# Patient Record
Sex: Female | Born: 1985 | Race: White | Hispanic: No | Marital: Married | Smoking: Never smoker
Health system: Southern US, Community
[De-identification: ages and names within clinical notes are randomized; demographics above are authoritative.]

## PROBLEM LIST (undated history)

## (undated) ENCOUNTER — Inpatient Hospital Stay (HOSPITAL_COMMUNITY): Payer: Self-pay

## (undated) DIAGNOSIS — G252 Other specified forms of tremor: Secondary | ICD-10-CM

## (undated) DIAGNOSIS — R569 Unspecified convulsions: Secondary | ICD-10-CM

## (undated) DIAGNOSIS — G47419 Narcolepsy without cataplexy: Secondary | ICD-10-CM

## (undated) DIAGNOSIS — N12 Tubulo-interstitial nephritis, not specified as acute or chronic: Secondary | ICD-10-CM

## (undated) DIAGNOSIS — D649 Anemia, unspecified: Secondary | ICD-10-CM

## (undated) DIAGNOSIS — F429 Obsessive-compulsive disorder, unspecified: Secondary | ICD-10-CM

## (undated) DIAGNOSIS — F419 Anxiety disorder, unspecified: Secondary | ICD-10-CM

## (undated) DIAGNOSIS — K219 Gastro-esophageal reflux disease without esophagitis: Secondary | ICD-10-CM

## (undated) DIAGNOSIS — Z87442 Personal history of urinary calculi: Secondary | ICD-10-CM

## (undated) DIAGNOSIS — K589 Irritable bowel syndrome without diarrhea: Secondary | ICD-10-CM

## (undated) DIAGNOSIS — M549 Dorsalgia, unspecified: Secondary | ICD-10-CM

## (undated) DIAGNOSIS — IMO0002 Reserved for concepts with insufficient information to code with codable children: Secondary | ICD-10-CM

## (undated) DIAGNOSIS — Z87898 Personal history of other specified conditions: Secondary | ICD-10-CM

## (undated) DIAGNOSIS — K8020 Calculus of gallbladder without cholecystitis without obstruction: Secondary | ICD-10-CM

## (undated) DIAGNOSIS — R350 Frequency of micturition: Secondary | ICD-10-CM

## (undated) DIAGNOSIS — I671 Cerebral aneurysm, nonruptured: Secondary | ICD-10-CM

## (undated) DIAGNOSIS — J45901 Unspecified asthma with (acute) exacerbation: Secondary | ICD-10-CM

## (undated) DIAGNOSIS — Q615 Medullary cystic kidney: Secondary | ICD-10-CM

## (undated) DIAGNOSIS — Z8669 Personal history of other diseases of the nervous system and sense organs: Secondary | ICD-10-CM

## (undated) DIAGNOSIS — Z87440 Personal history of urinary (tract) infections: Secondary | ICD-10-CM

## (undated) HISTORY — DX: Irritable bowel syndrome, unspecified: K58.9

## (undated) HISTORY — DX: Narcolepsy without cataplexy: G47.419

## (undated) HISTORY — PX: WISDOM TOOTH EXTRACTION: SHX21

## (undated) HISTORY — DX: Personal history of other specified conditions: Z87.898

## (undated) HISTORY — PX: BACK SURGERY: SHX140

## (undated) HISTORY — DX: Tubulo-interstitial nephritis, not specified as acute or chronic: N12

## (undated) HISTORY — DX: Cerebral aneurysm, nonruptured: I67.1

## (undated) HISTORY — DX: Unspecified asthma with (acute) exacerbation: J45.901

## (undated) HISTORY — DX: Obsessive-compulsive disorder, unspecified: F42.9

## (undated) HISTORY — DX: Reserved for concepts with insufficient information to code with codable children: IMO0002

---
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
## 2005-08-14 ENCOUNTER — Ambulatory Visit: Payer: Self-pay | Admitting: Internal Medicine

## 2005-09-25 DIAGNOSIS — Q615 Medullary cystic kidney: Secondary | ICD-10-CM

## 2005-09-25 HISTORY — DX: Medullary cystic kidney: Q61.5

## 2005-09-26 ENCOUNTER — Ambulatory Visit: Payer: Self-pay | Admitting: Internal Medicine

## 2006-01-20 ENCOUNTER — Ambulatory Visit: Payer: Self-pay | Admitting: Family Medicine

## 2006-02-22 ENCOUNTER — Ambulatory Visit: Payer: Self-pay | Admitting: Cardiology

## 2006-04-14 ENCOUNTER — Emergency Department (HOSPITAL_COMMUNITY): Admission: EM | Admit: 2006-04-14 | Discharge: 2006-04-14 | Payer: Self-pay | Admitting: Family Medicine

## 2006-04-17 ENCOUNTER — Observation Stay (HOSPITAL_COMMUNITY): Admission: AD | Admit: 2006-04-17 | Discharge: 2006-04-18 | Payer: Self-pay | Admitting: Internal Medicine

## 2006-04-17 ENCOUNTER — Ambulatory Visit: Payer: Self-pay | Admitting: Internal Medicine

## 2006-04-18 ENCOUNTER — Ambulatory Visit: Payer: Self-pay | Admitting: Internal Medicine

## 2006-09-08 ENCOUNTER — Emergency Department (HOSPITAL_COMMUNITY): Admission: EM | Admit: 2006-09-08 | Discharge: 2006-09-08 | Payer: Self-pay | Admitting: Emergency Medicine

## 2006-09-12 ENCOUNTER — Ambulatory Visit: Payer: Self-pay | Admitting: Internal Medicine

## 2006-12-07 ENCOUNTER — Ambulatory Visit: Payer: Self-pay | Admitting: Internal Medicine

## 2006-12-07 LAB — CONVERTED CEMR LAB
Albumin: 3.9 g/dL (ref 3.5–5.2)
Amylase: 78 units/L (ref 27–131)
BUN: 8 mg/dL (ref 6–23)
CO2: 29 meq/L (ref 19–32)
Calcium: 9.2 mg/dL (ref 8.4–10.5)
Creatinine, Ser: 0.6 mg/dL (ref 0.4–1.2)
Crystals: NEGATIVE
GFR calc non Af Amer: 135 mL/min
Hemoglobin, Urine: NEGATIVE
Mucus, UA: NEGATIVE
Sed Rate: 11 mm/hr (ref 0–25)
Total Protein, Urine: NEGATIVE mg/dL

## 2006-12-10 ENCOUNTER — Ambulatory Visit: Payer: Self-pay | Admitting: Internal Medicine

## 2007-01-28 ENCOUNTER — Ambulatory Visit: Payer: Self-pay | Admitting: Internal Medicine

## 2007-03-08 ENCOUNTER — Ambulatory Visit: Payer: Self-pay | Admitting: Internal Medicine

## 2007-04-07 ENCOUNTER — Emergency Department (HOSPITAL_COMMUNITY): Admission: EM | Admit: 2007-04-07 | Discharge: 2007-04-07 | Payer: Self-pay | Admitting: *Deleted

## 2007-05-17 ENCOUNTER — Ambulatory Visit: Payer: Self-pay | Admitting: Internal Medicine

## 2007-06-11 ENCOUNTER — Ambulatory Visit: Payer: Self-pay | Admitting: Internal Medicine

## 2007-06-11 LAB — CONVERTED CEMR LAB
Bacteria, UA: NEGATIVE
Basophils Absolute: 0 10*3/uL (ref 0.0–0.1)
Eosinophils Absolute: 0.1 10*3/uL (ref 0.0–0.6)
Eosinophils Relative: 1.4 % (ref 0.0–5.0)
HCT: 37.3 % (ref 36.0–46.0)
Hemoglobin, Urine: NEGATIVE
Ketones, ur: NEGATIVE mg/dL
Leukocytes, UA: NEGATIVE
MCHC: 34.7 g/dL (ref 30.0–36.0)
Neutrophils Relative %: 68.9 % (ref 43.0–77.0)
Platelets: 243 10*3/uL (ref 150–400)
RBC: 4.18 M/uL (ref 3.87–5.11)
RDW: 11.6 % (ref 11.5–14.6)
Specific Gravity, Urine: 1.025 (ref 1.000–1.03)
Total Protein, Urine: NEGATIVE mg/dL
WBC: 6.5 10*3/uL (ref 4.5–10.5)
pH: 6 (ref 5.0–8.0)

## 2007-06-12 ENCOUNTER — Telehealth: Payer: Self-pay | Admitting: Internal Medicine

## 2007-06-24 ENCOUNTER — Encounter: Payer: Self-pay | Admitting: *Deleted

## 2007-06-24 DIAGNOSIS — N12 Tubulo-interstitial nephritis, not specified as acute or chronic: Secondary | ICD-10-CM | POA: Insufficient documentation

## 2007-06-24 DIAGNOSIS — Z87898 Personal history of other specified conditions: Secondary | ICD-10-CM

## 2007-06-24 HISTORY — DX: Personal history of other specified conditions: Z87.898

## 2007-06-24 HISTORY — DX: Tubulo-interstitial nephritis, not specified as acute or chronic: N12

## 2007-10-29 ENCOUNTER — Inpatient Hospital Stay (HOSPITAL_COMMUNITY): Admission: AD | Admit: 2007-10-29 | Discharge: 2007-10-29 | Payer: Self-pay | Admitting: Obstetrics and Gynecology

## 2007-11-06 ENCOUNTER — Telehealth (INDEPENDENT_AMBULATORY_CARE_PROVIDER_SITE_OTHER): Payer: Self-pay | Admitting: *Deleted

## 2007-11-07 ENCOUNTER — Inpatient Hospital Stay (HOSPITAL_COMMUNITY): Admission: AD | Admit: 2007-11-07 | Discharge: 2007-11-07 | Payer: Self-pay | Admitting: Obstetrics and Gynecology

## 2007-11-12 ENCOUNTER — Inpatient Hospital Stay (HOSPITAL_COMMUNITY): Admission: AD | Admit: 2007-11-12 | Discharge: 2007-11-12 | Payer: Self-pay | Admitting: Obstetrics and Gynecology

## 2007-11-15 ENCOUNTER — Inpatient Hospital Stay (HOSPITAL_COMMUNITY): Admission: AD | Admit: 2007-11-15 | Discharge: 2007-11-18 | Payer: Self-pay | Admitting: Obstetrics and Gynecology

## 2007-12-17 ENCOUNTER — Inpatient Hospital Stay (HOSPITAL_COMMUNITY): Admission: AD | Admit: 2007-12-17 | Discharge: 2007-12-20 | Payer: Self-pay | Admitting: Obstetrics and Gynecology

## 2007-12-26 ENCOUNTER — Ambulatory Visit: Payer: Self-pay | Admitting: Internal Medicine

## 2008-01-01 ENCOUNTER — Inpatient Hospital Stay (HOSPITAL_COMMUNITY): Admission: AD | Admit: 2008-01-01 | Discharge: 2008-01-01 | Payer: Self-pay | Admitting: Obstetrics and Gynecology

## 2008-03-08 ENCOUNTER — Inpatient Hospital Stay (HOSPITAL_COMMUNITY): Admission: AD | Admit: 2008-03-08 | Discharge: 2008-03-08 | Payer: Self-pay | Admitting: Obstetrics and Gynecology

## 2008-04-02 ENCOUNTER — Inpatient Hospital Stay (HOSPITAL_COMMUNITY): Admission: AD | Admit: 2008-04-02 | Discharge: 2008-04-02 | Payer: Self-pay | Admitting: Obstetrics and Gynecology

## 2008-04-13 ENCOUNTER — Inpatient Hospital Stay (HOSPITAL_COMMUNITY): Admission: RE | Admit: 2008-04-13 | Discharge: 2008-04-14 | Payer: Self-pay | Admitting: Obstetrics and Gynecology

## 2008-04-21 ENCOUNTER — Inpatient Hospital Stay (HOSPITAL_COMMUNITY): Admission: AD | Admit: 2008-04-21 | Discharge: 2008-04-22 | Payer: Self-pay | Admitting: Obstetrics and Gynecology

## 2008-05-05 ENCOUNTER — Inpatient Hospital Stay (HOSPITAL_COMMUNITY): Admission: AD | Admit: 2008-05-05 | Discharge: 2008-05-05 | Payer: Self-pay | Admitting: Obstetrics and Gynecology

## 2008-05-08 ENCOUNTER — Inpatient Hospital Stay (HOSPITAL_COMMUNITY): Admission: AD | Admit: 2008-05-08 | Discharge: 2008-05-11 | Payer: Self-pay | Admitting: Obstetrics and Gynecology

## 2008-06-03 ENCOUNTER — Ambulatory Visit: Payer: Self-pay | Admitting: Surgery

## 2008-06-03 ENCOUNTER — Ambulatory Visit: Admission: RE | Admit: 2008-06-03 | Discharge: 2008-06-03 | Payer: Self-pay | Admitting: Obstetrics and Gynecology

## 2008-06-03 ENCOUNTER — Encounter (INDEPENDENT_AMBULATORY_CARE_PROVIDER_SITE_OTHER): Payer: Self-pay | Admitting: Obstetrics and Gynecology

## 2008-06-20 ENCOUNTER — Ambulatory Visit: Payer: Self-pay | Admitting: *Deleted

## 2008-06-20 LAB — CONVERTED CEMR LAB: Inflenza A Ag: NEGATIVE

## 2008-07-13 ENCOUNTER — Ambulatory Visit: Payer: Self-pay | Admitting: Internal Medicine

## 2008-07-31 ENCOUNTER — Telehealth: Payer: Self-pay | Admitting: Internal Medicine

## 2008-08-11 ENCOUNTER — Ambulatory Visit (HOSPITAL_COMMUNITY): Admission: RE | Admit: 2008-08-11 | Discharge: 2008-08-12 | Payer: Self-pay | Admitting: Orthopedic Surgery

## 2008-08-13 ENCOUNTER — Telehealth: Payer: Self-pay | Admitting: Internal Medicine

## 2008-08-14 ENCOUNTER — Ambulatory Visit: Payer: Self-pay | Admitting: Internal Medicine

## 2008-08-14 LAB — CONVERTED CEMR LAB
BUN: 11 mg/dL (ref 6–23)
Basophils Relative: 0.3 % (ref 0.0–3.0)
CO2: 31 meq/L (ref 19–32)
Chloride: 102 meq/L (ref 96–112)
Creatinine, Ser: 0.8 mg/dL (ref 0.4–1.2)
Eosinophils Absolute: 0.1 10*3/uL (ref 0.0–0.7)
Eosinophils Relative: 0.8 % (ref 0.0–5.0)
GFR calc non Af Amer: 95 mL/min
Lymphocytes Relative: 27.3 % (ref 12.0–46.0)
MCV: 80.2 fL (ref 78.0–100.0)
Monocytes Relative: 7.1 % (ref 3.0–12.0)
Neutrophils Relative %: 64.5 % (ref 43.0–77.0)
Platelets: 320 10*3/uL (ref 150–400)
Potassium: 3.6 meq/L (ref 3.5–5.1)
RBC: 4.49 M/uL (ref 3.87–5.11)
WBC: 9.4 10*3/uL (ref 4.5–10.5)

## 2008-08-24 ENCOUNTER — Telehealth: Payer: Self-pay | Admitting: Internal Medicine

## 2009-06-29 ENCOUNTER — Emergency Department (HOSPITAL_COMMUNITY): Admission: EM | Admit: 2009-06-29 | Discharge: 2009-06-29 | Payer: Self-pay | Admitting: Family Medicine

## 2009-10-27 ENCOUNTER — Ambulatory Visit: Payer: Self-pay | Admitting: Internal Medicine

## 2009-10-27 DIAGNOSIS — N912 Amenorrhea, unspecified: Secondary | ICD-10-CM

## 2009-10-27 LAB — CONVERTED CEMR LAB
Basophils Relative: 0.9 % (ref 0.0–3.0)
Eosinophils Absolute: 0 10*3/uL (ref 0.0–0.7)
HCT: 36.4 % (ref 36.0–46.0)
Hemoglobin: 12.1 g/dL (ref 12.0–15.0)
Lymphocytes Relative: 23.9 % (ref 12.0–46.0)
Lymphs Abs: 1.4 10*3/uL (ref 0.7–4.0)
MCHC: 33.3 g/dL (ref 30.0–36.0)
Monocytes Relative: 6.2 % (ref 3.0–12.0)
Neutro Abs: 3.8 10*3/uL (ref 1.4–7.7)
RBC: 4.24 M/uL (ref 3.87–5.11)

## 2009-10-28 ENCOUNTER — Telehealth: Payer: Self-pay | Admitting: Internal Medicine

## 2010-01-04 ENCOUNTER — Telehealth: Payer: Self-pay | Admitting: Internal Medicine

## 2010-01-05 ENCOUNTER — Ambulatory Visit: Payer: Self-pay | Admitting: Internal Medicine

## 2010-01-05 DIAGNOSIS — J45901 Unspecified asthma with (acute) exacerbation: Secondary | ICD-10-CM

## 2010-01-05 HISTORY — DX: Unspecified asthma with (acute) exacerbation: J45.901

## 2010-02-14 ENCOUNTER — Ambulatory Visit: Payer: Self-pay | Admitting: Internal Medicine

## 2010-02-15 ENCOUNTER — Encounter: Payer: Self-pay | Admitting: Internal Medicine

## 2010-10-25 NOTE — Progress Notes (Signed)
  Phone Note Call from Patient Call back at [facility] Phone [phone]   Caller: Patient Call For: Dr Yetta Barre Summary of Call: Pt anxious for lab results, please let pt know results when they come in. Initial call taken by: Verdell Face,  October 28, 2009 10:30 AM  Follow-up for Phone Call        cbc is normal, pregnancy test is positive Follow-up by: Etta Grandchild MD,  October 28, 2009 1:53 PM  Additional Follow-up for Phone Call Additional follow up Details #1::        informed pt of results Additional Follow-up by: Ami Bullins CMA,  October 28, 2009 2:00 PM

## 2010-10-25 NOTE — Miscellaneous (Signed)
Summary: Orders Update pft charges  Clinical Lists Changes  Orders: Added new Service order of Carbon Monoxide diffusing w/capacity (94720) - Signed Added new Service order of Lung Volumes (94240) - Signed Added new Service order of Spirometry (Pre & Post) (94060) - Signed 

## 2010-10-25 NOTE — Progress Notes (Signed)
Summary: "Cold"  Phone Note Call from Patient   Summary of Call: Pt developed sore throat 3/25. Non productive cough started 4/3 and sore throat became worse. C/o some chills.  No fever or sinus congestion/drainage.  She has tried otc robitussin and theraflu w/little relief. Unable to come into the office until Thursday pm. Any other suggestions?   *She is no longer pregnant Initial call taken by: Lamar Sprinkles, CMA,  January 04, 2010 5:52 PM  Follow-up for Phone Call        OK for APAP 1000mg  three times a day, continue robitussin DM 1 tsp q 6. hydrtate, vit C. 1500mg  daily Follow-up by: Jacques Navy MD,  January 04, 2010 5:58 PM  Additional Follow-up for Phone Call Additional follow up Details #1::        Pt informed, rescheduled for office visit tomorrow am Additional Follow-up by: Lamar Sprinkles, CMA,  January 04, 2010 6:06 PM

## 2010-10-25 NOTE — Assessment & Plan Note (Signed)
Summary: [name] AND SWEATING,SORE THROAT,HEADACHE,EYES SWOLLEN-LB   Vital Signs:  Patient profile:   25 year old female Menstrual status:  irregular LMP:     09/17/2009 Height:      60 inches Weight:      171 pounds BMI:     33.52 O2 Sat:      98 % on Room air Temp:     98.4 degrees F oral Pulse rate:   80 / minute Pulse rhythm:   regular Resp:     16 per minute BP sitting:   124 / 76  (right arm)  Vitals Entered By: Lucious Groves (October 27, 2009 3:23 PM)  Nutrition Counseling: Patient's BMI is greater than 25 and therefore counseled on weight management options.  O2 Flow:  Room air CC: C/O fatigue, sweating, aches, sore throat, and headache x1 week. Pt has tried OTC Robitussin with no relief./kb, URI symptoms Is Patient Diabetic? No LMP (date): 09/17/2009     Menstrual Status irregular Enter LMP: 09/17/2009 Last PAP Result historical   Primary Care Provider:  Jacques Navy MD  CC:  C/O fatigue, sweating, aches, sore throat, and headache x1 week. Pt has tried OTC Robitussin with no relief./kb, and URI symptoms.  History of Present Illness:  URI Symptoms      This is a 25 year old woman who presents with URI symptoms.  The symptoms began 1 week ago.  The severity is described as mild.  The patient reports sore throat and dry cough, but denies nasal congestion, clear nasal discharge, purulent nasal discharge, earache, and sick contacts.  The patient denies fever, stiff neck, dyspnea, wheezing, rash, vomiting, diarrhea, and use of an antipyretic.  The patient also reports itchy watery eyes, itchy throat, and sneezing.  The patient denies headache, muscle aches, and severe fatigue.  The patient denies the following risk factors for Strep sinusitis: unilateral facial pain, unilateral nasal discharge, double sickening, tooth pain, Strep exposure, tender adenopathy, and absence of cough.    Preventive Screening-Counseling & Management  Alcohol-Tobacco     Alcohol  drinks/day: 0     Smoking Status: never      Drug Use:  no.    Current Medications (verified): 1)  Bacopa Vitamin .... Daily 2)  Beyaz 3-0.02-0.451 Mg Tabs (Drospiren-Eth Estrad-Levomefol) .... Daily  Allergies (verified): 1)  ! * Zomig 2)  ! * Topamax 3)  ! * Axert 4)  ! Cipro  Past History:  Past Medical History: Reviewed history from 07/13/2008 and no changes required. PYELONEPHRITIS (ICD-590.80) MIGRAINES, HX OF (ICD-V13.8)  Past Surgical History: Reviewed history from 07/13/2008 and no changes required. none  Social History: Reviewed history from 07/13/2008 and no changes required. 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   GU:  Denies abnormal vaginal bleeding and discharge.  Physical Exam  General:  alert, well-developed, well-nourished, well-hydrated, appropriate dress, normal appearance, healthy-appearing, and cooperative to examination.   Head:  normocephalic, atraumatic, no abnormalities observed, and no abnormalities palpated.   Eyes:  No corneal or conjunctival inflammation noted. EOMI. Perrla. Funduscopic exam benign, without hemorrhages, exudates or papilledema. Vision grossly normal. Ears:  R ear normal and L ear normal.   Nose:  External nasal examination shows no deformity or inflammation. Nasal mucosa are  pink and moist without lesions or exudates. Mouth:  Oral mucosa and oropharynx without lesions or exudates.  Teeth in good repair. Neck:  supple, full ROM, no masses, no thyromegaly, no thyroid nodules or tenderness, and no cervical lymphadenopathy.   Lungs:  Normal respiratory effort, chest expands symmetrically. Lungs are clear to auscultation, no crackles or wheezes. Heart:  Normal rate and regular rhythm. S1 and S2 normal without  gallop, murmur, click, rub or other extra sounds. Abdomen:  soft, non-tender, normal bowel sounds, no distention, no masses, no guarding, no rigidity, no hepatomegaly, and no splenomegaly.   Msk:  No deformity or scoliosis noted of thoracic or lumbar spine.   Pulses:  R and L carotid,radial,femoral,dorsalis pedis and posterior tibial pulses are full and equal bilaterally Extremities:  No clubbing, cyanosis, edema, or deformity noted with normal full range of motion of all joints.   Neurologic:  No cranial nerve deficits noted. Station and gait are normal. Plantar reflexes are down-going bilaterally. DTRs are symmetrical throughout. Sensory, motor and coordinative functions appear intact. Skin:  Intact without suspicious lesions or rashes Cervical Nodes:  no anterior cervical adenopathy and no posterior cervical adenopathy.   Axillary Nodes:  no R axillary adenopathy and no L axillary adenopathy.   Psych:  Cognition and judgment appear intact. Alert and cooperative with normal attention span and concentration. No apparent delusions, illusions, hallucinations   Impression & Recommendations:  Problem # 1:  URI (ICD-465.9) Assessment Deteriorated this sounds viral so no antibiotics were given but will offer symptom relief The following medications were removed from the medication list:    Tylenol Extra Strength 500 Mg Tabs (Acetaminophen) .Marland Kitchen... As directed as needed Her updated medication list for this problem includes:    Mytussin Ac 100-10 Mg/25ml Syrp (Guaifenesin-codeine) .Marland Kitchen... 5-10 ml by mouth qid as needed for cough  Orders: Venipuncture (14782) TLB-CBC Platelet - w/Differential (85025-CBCD) TLB-Preg Serum Quant (B-hCG) (84702-HCG-QN)  Problem # 2:  ABSENCE OF MENSTRUATION (ICD-626.0) Assessment: New  Her updated medication list for this problem includes:    Beyaz 3-0.02-0.451 Mg Tabs (Drospiren-eth estrad-levomefol) .Marland Kitchen... Daily  Orders: Venipuncture (95621) TLB-CBC Platelet -  w/Differential (85025-CBCD) TLB-Preg Serum Quant (B-hCG) (84702-HCG-QN)  Complete Medication List: 1)  Bacopa Vitamin  .... Daily 2)  Beyaz 3-0.02-0.451 Mg Tabs (Drospiren-eth estrad-levomefol) .... Daily 3)  Mytussin Ac 100-10 Mg/40ml Syrp (Guaifenesin-codeine) .... 5-10 ml by mouth qid as needed for cough  Patient Instructions: 1)  Please schedule a follow-up appointment in 2 weeks. 2)  Get plenty of rest, drink lots of clear liquids, and use Tylenol or Ibuprofen for fever and comfort. Return in 7-10 days if you're not better:sooner if you're feeling worse. Prescriptions: MYTUSSIN AC 100-10 MG/5ML SYRP (GUAIFENESIN-CODEINE) 5-10 ml by mouth QID as needed for cough  #6 ounces x 0   Entered and Authorized by:   Etta Grandchild MD   Signed by:   Etta Grandchild MD on 10/27/2009   Method used:   Print then Give to Patient   RxID:   [id number]   Preventive Care Screening  Pap Smear:    Date:  10/12/2009    Results:  historical

## 2010-10-25 NOTE — Assessment & Plan Note (Signed)
Summary: cough, sore throat / SD   Vital Signs:  Patient profile:   25 year old female Menstrual status:  irregular Height:      60 inches Weight:      162.25 pounds BMI:     31.80 O2 Sat:      98 % on Room air Temp:     97.5 degrees F oral Pulse rate:   79 / minute Pulse rhythm:   regular Resp:     16 per minute BP sitting:   100 / 64  (left arm) Cuff size:   large  Vitals Entered By: Rock Nephew CMA (January 05, 2010 10:02 AM)  Nutrition Counseling: Patient's BMI is greater than 25 and therefore counseled on weight management options.  O2 Flow:  Room air CC: cough, congestion, sore throat x 11/2009, Cough   Primary Care Provider:  Jacques Navy MD  CC:  cough, congestion, sore throat x 11/2009, and Cough.  History of Present Illness:  Cough      This is a 25 year old woman who presents with Cough.  The symptoms began 3 weeks ago.  The intensity is described as moderate.  The patient reports non-productive cough, shortness of breath, and wheezing, but denies productive cough, pleuritic chest pain, exertional dyspnea, fever, hemoptysis, and malaise.  Associated symtpoms include cold/URI symptoms.  The patient denies the following symptoms: sore throat, weight loss, acid reflux symptoms, and peripheral edema.  The cough is worse with cold exposure.  Risk factors include history of asthma.    Allergies (verified): 1)  ! * Zomig 2)  ! * Topamax 3)  ! * Axert 4)  ! Cipro  Past History:  Past Medical History: Reviewed history from 07/13/2008 and no changes required. PYELONEPHRITIS (ICD-590.80) MIGRAINES, HX OF (ICD-V13.8)  Past Surgical History: Reviewed history from 07/13/2008 and no changes required. none  Social History: Reviewed history from 10/27/2009 and no changes required. 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  The  patient denies anorexia, fever, weight loss, weight gain, chest pain, syncope, dyspnea on exertion, peripheral edema, hemoptysis, abdominal pain, hematuria, suspicious skin lesions, enlarged lymph nodes, and angioedema.    Physical Exam  General:  alert, well-developed, well-nourished, well-hydrated, appropriate dress, normal appearance, healthy-appearing, and cooperative to examination.   Head:  normocephalic, atraumatic, no abnormalities observed, and no abnormalities palpated.   Ears:  R ear normal and L ear normal.   Mouth:  Oral mucosa and oropharynx without lesions or exudates.  Teeth in good repair. Neck:  supple, full ROM, no masses, no thyromegaly, no thyroid nodules or tenderness, and no cervical lymphadenopathy.   Lungs:  Normal respiratory effort, chest expands symmetrically. Lungs are clear to auscultation, no crackles or wheezes. Heart:  Normal rate and regular rhythm. S1 and S2 normal without gallop, murmur, click, rub or other extra sounds. Abdomen:  soft, non-tender, normal bowel sounds, no distention, no masses, no guarding, no rigidity, no hepatomegaly, and no splenomegaly.   Msk:  No deformity or scoliosis noted of thoracic or lumbar spine.   Pulses:  R and L carotid,radial,femoral,dorsalis pedis and posterior tibial pulses are full and equal bilaterally Extremities:  No clubbing, cyanosis, edema, or deformity noted with normal full range of motion of all joints.   Neurologic:  No cranial nerve deficits noted. Station  and gait are normal. Plantar reflexes are down-going bilaterally. DTRs are symmetrical throughout. Sensory, motor and coordinative functions appear intact. Skin:  Intact without suspicious lesions or rashes Cervical Nodes:  no anterior cervical adenopathy and no posterior cervical adenopathy.   Axillary Nodes:  no R axillary adenopathy and no L axillary adenopathy.   Psych:  Cognition and judgment appear intact. Alert and cooperative with normal attention span and  concentration. No apparent delusions, illusions, hallucinations   Impression & Recommendations:  Problem # 1:  ABSENCE OF MENSTRUATION (ICD-626.0) Assessment Unchanged she states that she went to the hospital and she had a misscarriage Her updated medication list for this problem includes:    Beyaz 3-0.02-0.451 Mg Tabs (Drospiren-eth estrad-levomefol) .Marland Kitchen... Daily  Problem # 2:  COUGH (ICD-786.2) Assessment: New I am concerned that she has asthma. will chest xray fpr pna, edema, effusions Orders: Pulmonary Referral (Pulmonary) T-2 View CXR (71020TC)  Problem # 3:  ASTHMA NOS W/ACUTE EXACERBATION (MWU-132.44) Assessment: New  try depo-medrol  Pulmonary Functions Reviewed: O2 sat: 98 (01/05/2010)  Orders: Pulmonary Referral (Pulmonary) Depo- Medrol 80mg  (J1040) Admin of Therapeutic Inj  intramuscular or subcutaneous (01027)  Her updated medication list for this problem includes:    Symbicort 80-4.5 Mcg/act Aero (Budesonide-formoterol fumarate) .Marland Kitchen... 2 puffs two times a day for asthma  Complete Medication List: 1)  Beyaz 3-0.02-0.451 Mg Tabs (Drospiren-eth estrad-levomefol) .... Daily 2)  Symbicort 80-4.5 Mcg/act Aero (Budesonide-formoterol fumarate) .... 2 puffs two times a day for asthma 3)  Tussionex Pennkinetic Er 8-10 Mg/29ml Lqcr (Chlorpheniramine-hydrocodone) .... 5 ml by mouth two times a day as needed for cough  Patient Instructions: 1)  Please schedule a follow-up appointment in 1 month. 2)  If you could be exposed to sexually transmitted diseases, you should use a condom. 3)  If you are having sex and you or your partner don't want a child, use contraception. Prescriptions: TUSSIONEX PENNKINETIC ER 8-10 MG/5ML LQCR (CHLORPHENIRAMINE-HYDROCODONE) 5 ml by mouth two times a day as needed for cough  #4 ounces x 0   Entered and Authorized by:   Etta Grandchild MD   Signed by:   Etta Grandchild MD on 01/05/2010   Method used:   Print then Give to Patient   RxID:    [id number] SYMBICORT 80-4.5 MCG/ACT AERO (BUDESONIDE-FORMOTEROL FUMARATE) 2 puffs two times a day for asthma  #3 inhs. x 0   Entered and Authorized by:   Etta Grandchild MD   Signed by:   Etta Grandchild MD on 01/05/2010   Method used:   Samples Given   RxID:   [id number]    Medication Administration  Injection # 1:    Medication: Depo- Medrol 80mg     Diagnosis: ASTHMA NOS W/ACUTE EXACERBATION (ZYS-063.01)    Route: IM    Site: LUOQ gluteus    Exp Date: 07/26/2012    Lot #: [id number]    Mfr: Pharm[employer]    Patient tolerated injection without complications    Given by: Lucious Groves (January 05, 2010 10:21 AM)  Injection # 2:    Medication: Depo- Medrol 40mg     Diagnosis: ASTHMA NOS W/ACUTE EXACERBATION (NAT-557.32)    Route: IM    Site: LUOQ gluteus    Exp Date: 07/26/2012    Lot #: [id number]  Orders Added: 1)  Pulmonary Referral [Pulmonary] 2)  Depo- Medrol 80mg  [J1040] 3)  Admin of Therapeutic Inj  intramuscular or subcutaneous [96372] 4)  T-2 View CXR [71020TC] 5)  Est. Patient  Level IV [id number]

## 2010-10-27 ENCOUNTER — Encounter: Payer: Self-pay | Admitting: Internal Medicine

## 2010-10-27 ENCOUNTER — Ambulatory Visit: Payer: 59 | Admitting: Internal Medicine

## 2010-10-27 DIAGNOSIS — R071 Chest pain on breathing: Secondary | ICD-10-CM

## 2010-10-27 DIAGNOSIS — Q615 Medullary cystic kidney: Secondary | ICD-10-CM | POA: Insufficient documentation

## 2010-10-27 DIAGNOSIS — R197 Diarrhea, unspecified: Secondary | ICD-10-CM

## 2010-10-28 ENCOUNTER — Telehealth: Payer: Self-pay | Admitting: Internal Medicine

## 2010-11-02 NOTE — Assessment & Plan Note (Signed)
Summary: spine and left rib is tender and sore/diarrhea after eating/m...   Vital Signs:  Patient profile:   25 year old female Menstrual status:  irregular Height:      60 inches Weight:      167 pounds BMI:     32.73 O2 Sat:      99 % on Room air Temp:     97.8 degrees F oral Pulse rate:   95 / minute BP sitting:   128 / 80  (left arm) Cuff size:   large  Vitals Entered By: Ami Bullins CMA (October 27, 2010 3:20 PM)  O2 Flow:  Room air CC: pt c/o tenderness in spine with pain on left side and diarrhea x1 week/ ab   Primary Care Provider:  Jacques Navy MD  CC:  pt c/o tenderness in spine with pain on left side and diarrhea x1 week/ ab.  History of Present Illness: Patient last seen by me in Oct '09. IN the interval she has seen Dr. Yetta Barre in Feb '11 - URI with normal CBC, elevated BHCG that turned out to be a false positive. She was seen in April '11 for URI had a normal CXR, had normal PFTs. She did do well  she has had a cough for two weeks that is non-productive with no response to mucinex. She has subsequently had upper thoracic back pain and pain in the rib cage on the left. For the past three days she has been having watery stools with urgency and on episode of incontinence. No blood or mucus in the stool - looks like green water. She has not had any medications for the diarrhea that occurs 3-4 times per day. Denies nausea or vomiting. Poor appetitie - eating starches. She felt feverish but no documented fever. She denies dysuria, no hematuria. She has chronic frequency issues.   Current Medications (verified): 1)  Beyaz 3-0.02-0.451 Mg Tabs (Drospiren-Eth Estrad-Levomefol) .... Daily 2)  Symbicort 80-4.5 Mcg/act Aero (Budesonide-Formoterol Fumarate) .... 2 Puffs Two Times A Day For Asthma 3)  Tussionex Pennkinetic Er 8-10 Mg/62ml Lqcr (Chlorpheniramine-Hydrocodone) .... 5 Ml By Mouth Two Times A Day As Needed For Cough  Allergies (verified): 1)  ! * Zomig 2)  ! *  Topamax 3)  ! * Axert 4)  ! 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  The patient denies anorexia, weight loss, weight gain, hoarseness, dyspnea on exertion, peripheral edema, hemoptysis, hematochezia, incontinence, muscle weakness, difficulty walking, unusual weight change, abnormal bleeding, and angioedema.         ears feel stopped up  Physical Exam  General:  Well-developed,well-nourished,in no acute distress; alert,appropriate and cooperative throughout examination Head:  normocephalic and atraumatic.   Eyes:  vision grossly intact, pupils equal, and pupils round.   Neck:  supple.   Chest Wall:  very tender to palpation at the left chest posterior and anterior especially in the intercostal spaces. Lungs:  normal respiratory effort, normal breath sounds, no crackles, and no wheezes.   Heart:  normal rate and regular rhythm.   Abdomen:  soft, non-tender, normal bowel sounds, no masses, and no guarding.   Msk:  normal ROM.   Pulses:  2+ radial Neurologic:  alert & oriented X3, cranial nerves II-XII intact, and gait normal.   Skin:  turgor normal, color normal, and no rashes.   Cervical Nodes:  no anterior cervical adenopathy and no posterior cervical adenopathy.   Psych:  Oriented X3, memory intact for recent and remote, normally interactive, and good eye contact.     Impression & Recommendations:  Problem # 1:  CHEST WALL PAIN, ACUTE (ICD-786.52) Lungs are clear with o evidence of respoiratory infection. Her chest wal is vey tender: strain from coughing vs costochondirits.  Plan - prednisone burst  and taper.  Problem # 2:  LOOSE STOOLS (ICD-787.91) Ms. Chelsea Primus gives a h/o intermittent bowel habit, bloating gas, early satiety wuggestive of IBS.  Plan - provided patient information on IBS           Rx dicyclomine 10mg  three times a day  to titrate to symptoms.  Complete Medication List: 1)  Beyaz 3-0.02-0.451 Mg Tabs (Drospiren-eth estrad-levomefol) .... Daily 2)  Symbicort 80-4.5 Mcg/act Aero (Budesonide-formoterol fumarate) .... 2 puffs two times a day for asthma 3)  Tussionex Pennkinetic Er 8-10 Mg/51ml Lqcr (Chlorpheniramine-hydrocodone) .... 5 ml by mouth two times a day as needed for cough 4)  Dicyclomine Hcl 10 Mg Caps (Dicyclomine hcl) .Marland Kitchen.. 1 by mouth three times a day. self titrate based on symptoms of ibs 5)  Prednisone 10 Mg Tabs (Prednisone) .... 4 tabs once daily x 1, 3 tabs once daily x 3, 2 gtabs once daily x 3 and 1 tab qds x 6 for chest wall inflammation  Patient Instructions: 1)  Chest wall pain - no evidence of respiratory infection. Tenderness is in the chest wall and between the ribs. Plan - prednisone burst and taper: 40mg  x 1 day, 30mg  x 3 days, 20mg  x 3 day, 10 mg x 6 days; use of heat, e.g. therma patch or lineament; tylenol 500mg  three times a day for pain control. 2)  IBS - sounds like an irritible bowel syndrome  see handout). Plan high fiber diet, bentyl 10 mg three times a day (start once a day for several days, then twice a day for several days then three times a day. ) as your symptoms improve you can reduce the dosing.  Prescriptions: PREDNISONE 10 MG TABS (PREDNISONE) 4 tabs once daily x 1, 3 tabs once daily x 3, 2 gtabs once daily x 3 and 1 tab qds x 6 for chest wall inflammation  #25 x 0   Entered and Authorized by:   Jacques Navy MD   Signed by:   Jacques Navy MD on 10/27/2010   Method used:   Electronically to        [facility]* (retail)       [address] [place], [place]   [address]       Ph: [phone] or [phone]       Fax: [phone]   RxID:   [id number] DICYCLOMINE HCL 10 MG CAPS (DICYCLOMINE HCL) 1 by mouth three times a day. Self titrate based on symptoms of IBS  #90 x 5   Entered and Authorized by:   Jacques Navy MD   Signed by:   Jacques Navy MD on 10/27/2010   Method used:   Electronically to        [facility] (retail)       [address] [place], [place]  [address]       Ph: [phone] or [phone]       Fax: [phone]   RxID:   [id number]    Orders Added: 1)  Est. Patient Level III [64403]

## 2010-11-02 NOTE — Progress Notes (Signed)
  Phone Note Call from Patient Call back at Home Phone [phone] [facility]     Caller: Patient Summary of Call: Patient started prednisone last night. Pt c/o of neck tightness, arm pain (left), burning sensation in stomach and throat. Initial call taken by: Rock Nephew CMA,  October 28, 2010 10:36 AM  Follow-up for Phone Call        unlikely to cause anything except perhaps some stomach irritation for which she should take zantac, etc.  Follow-up by: Jacques Navy MD,  October 28, 2010 12:49 PM  Additional Follow-up for Phone Call Additional follow up Details #1::        left detailed vm for pt w/above info Additional Follow-up by: Lamar Sprinkles, CMA,  October 28, 2010 5:01 PM

## 2010-11-04 ENCOUNTER — Encounter: Payer: Self-pay | Admitting: Internal Medicine

## 2010-11-04 ENCOUNTER — Ambulatory Visit (INDEPENDENT_AMBULATORY_CARE_PROVIDER_SITE_OTHER): Payer: 59 | Admitting: Internal Medicine

## 2010-11-04 ENCOUNTER — Telehealth: Payer: Self-pay | Admitting: Internal Medicine

## 2010-11-04 ENCOUNTER — Other Ambulatory Visit: Payer: Self-pay | Admitting: Internal Medicine

## 2010-11-04 ENCOUNTER — Ambulatory Visit (HOSPITAL_COMMUNITY)
Admission: RE | Admit: 2010-11-04 | Discharge: 2010-11-04 | Disposition: A | Discharge status: Home / Self Care | Payer: 59 | Source: Ambulatory Visit | Attending: Internal Medicine | Admitting: Internal Medicine

## 2010-11-04 DIAGNOSIS — M542 Cervicalgia: Secondary | ICD-10-CM

## 2010-11-04 DIAGNOSIS — K589 Irritable bowel syndrome without diarrhea: Secondary | ICD-10-CM

## 2010-11-04 DIAGNOSIS — M549 Dorsalgia, unspecified: Secondary | ICD-10-CM | POA: Insufficient documentation

## 2010-11-04 DIAGNOSIS — M5412 Radiculopathy, cervical region: Secondary | ICD-10-CM

## 2010-11-04 DIAGNOSIS — M25519 Pain in unspecified shoulder: Secondary | ICD-10-CM

## 2010-11-06 ENCOUNTER — Telehealth: Payer: Self-pay | Admitting: Internal Medicine

## 2010-11-10 NOTE — Progress Notes (Signed)
Summary: OV TODAY  Phone Note Call from Patient Call back at [facility] Phone [phone]   Summary of Call: Pt c/o muscle stiffness and tenderness. Req a call back.  Initial call taken by: Lamar Sprinkles, CMA,  November 04, 2010 11:07 AM  Follow-up for Phone Call        Spoke w/pt. She is c/o pain all over - sore to touch. Prednisone causes her to "feel funny and weird" she has not taken it like she should. Having daily severe h/a's. Otc meds have not helped. She is worried about what is going on - I advised eval today and scheduled for 4:30.  Follow-up by: Lamar Sprinkles, CMA,  November 04, 2010 12:32 PM

## 2010-11-14 ENCOUNTER — Emergency Department (HOSPITAL_COMMUNITY)
Admission: EM | Admit: 2010-11-14 | Discharge: 2010-11-14 | Disposition: A | Discharge status: Home / Self Care | Payer: 59 | Attending: Emergency Medicine | Admitting: Emergency Medicine

## 2010-11-14 ENCOUNTER — Emergency Department (HOSPITAL_COMMUNITY): Payer: 59

## 2010-11-14 ENCOUNTER — Encounter (HOSPITAL_COMMUNITY): Payer: Self-pay

## 2010-11-14 DIAGNOSIS — R4182 Altered mental status, unspecified: Secondary | ICD-10-CM | POA: Insufficient documentation

## 2010-11-14 DIAGNOSIS — Z79899 Other long term (current) drug therapy: Secondary | ICD-10-CM | POA: Insufficient documentation

## 2010-11-14 HISTORY — DX: Medullary cystic kidney: Q61.5

## 2010-11-14 LAB — BASIC METABOLIC PANEL
Calcium: 9.1 mg/dL (ref 8.4–10.5)
Creatinine, Ser: 0.67 mg/dL (ref 0.4–1.2)
GFR calc Af Amer: >60 mL/min (ref >60)
GFR calc non Af Amer: >60 mL/min (ref >60)
Sodium: 139 mEq/L (ref 135–145)

## 2010-11-14 LAB — URINALYSIS, ROUTINE W REFLEX MICROSCOPIC
Hgb urine dipstick: NEGATIVE
Ketones, ur: NEGATIVE mg/dL
Protein, ur: NEGATIVE mg/dL
Urine Glucose, Fasting: NEGATIVE mg/dL
pH: 5 (ref 5.0–8.0)

## 2010-11-14 LAB — CBC
MCH: 27.9 pg (ref 26.0–34.0)
MCHC: 32.8 g/dL (ref 30.0–36.0)
RDW: 13 % (ref 11.5–15.5)

## 2010-11-15 ENCOUNTER — Ambulatory Visit (INDEPENDENT_AMBULATORY_CARE_PROVIDER_SITE_OTHER): Payer: 59 | Admitting: Internal Medicine

## 2010-11-15 ENCOUNTER — Encounter: Payer: Self-pay | Admitting: Internal Medicine

## 2010-11-15 DIAGNOSIS — R4182 Altered mental status, unspecified: Secondary | ICD-10-CM

## 2010-11-16 ENCOUNTER — Telehealth: Payer: Self-pay | Admitting: Internal Medicine

## 2010-11-16 NOTE — Assessment & Plan Note (Signed)
Summary: pain all over/meds not helping/severe h/a's daily/SD   Vital Signs:  Patient profile:   25 year old female Menstrual status:  irregular Height:      60 inches Weight:      172 pounds BMI:     33.71 O2 Sat:      99 % on Room air Temp:     97.7 degrees F oral Pulse rate:   80 / minute BP sitting:   114 / 74  (left arm) Cuff size:   large  Vitals Entered By: Bill Salinas CMA (November 04, 2010 4:49 PM)  O2 Flow:  Room air  Primary Care Provider:  Jacques Navy MD   History of Present Illness: Seen on Feb 2 for what was thought to be costochondritis vs thoracic strain. She had no signs of active bacterial infection. She was started on a prednisone burst and taper starting with 40mg  x 1, 30 x 3, 20 x 3, 10 x 6. She since starting the prednisone she had stomach burning and she has felt funny, not sleeping, has had fluid retention with a 5lb weight gain. She has had no improvement in the pain and tenderness in the rib cage. She is now c/o of loss of sensation in the left neck shoulder and arm as well as the right flank. she has normal range of motion, grip, movement. she has not felt feverish and has no documented fever, no sinus drainage, ear drainage. She is not coughing but does feel a tightness in the chest   She has had a good response to bentyl for IBS symptoms: decreased diarrhea, bloating and gas.   Current Medications (verified): 1)  Beyaz 3-0.02-0.451 Mg Tabs (Drospiren-Eth Estrad-Levomefol) .... Daily 2)  Symbicort 80-4.5 Mcg/act Aero (Budesonide-Formoterol Fumarate) .... 2 Puffs Two Times A Day For Asthma 3)  Tussionex Pennkinetic Er 8-10 Mg/68ml Lqcr (Chlorpheniramine-Hydrocodone) .... 5 Ml By Mouth Two Times A Day As Needed For Cough 4)  Dicyclomine Hcl 10 Mg Caps (Dicyclomine Hcl) .Marland Kitchen.. 1 By Mouth Three Times A Day. Self Titrate Based On Symptoms of Ibs 5)  Prednisone 10 Mg Tabs (Prednisone) .... 4 Tabs Once Daily X 1, 3 Tabs Once Daily X 3, 2 Gtabs Once Daily X 3  and 1 Tab Qds X 6 For Chest Wall Inflammation  Allergies (verified): 1)  ! * Zomig 2)  ! * Topamax 3)  ! * Axert 4)  ! Cipro PMH-FH-SH reviewed-no changes except otherwise noted  Review of Systems       The patient complains of weight gain.  The patient denies anorexia, fever, vision loss, decreased hearing, chest pain, syncope, dyspnea on exertion, prolonged cough, abdominal pain, severe indigestion/heartburn, muscle weakness, difficulty walking, abnormal bleeding, and enlarged lymph nodes.    Physical Exam  General:  overweight white woman in no acute distress Head:  normocephalic and atraumatic.   Eyes:  pupils equal and pupils round.  C&S clear Neck:  decreased range of motion: cannot fully flex the neck, rotate to right 80%, rotate left 30%. Chest Wall:  no deformities and no tenderness.   Lungs:  normal respiratory effort and normal breath sounds.   Heart:  normal rate and regular rhythm.   Msk:  no joint tenderness, no joint swelling, no joint warmth, and no joint deformities.   Pulses:  2+radial Neurologic:  alert & oriented X3 and cranial nerves II-XII intact.  MS- decreased biceps strength L vs R; weak grip bilaterally; decreased sensation to light touch, pin  prick and deep vibratory sensation left hand, arm, shoulder, neck and upper anterior chest wall. Decreased sensation to light touch, pin prick right back scapula to waist.  Skin:  turgor normal and color normal.   Cervical Nodes:  no anterior cervical adenopathy and no posterior cervical adenopathy.   Psych:  Oriented X3, good eye contact, and moderately anxious.     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  Plan - cervical spine films and thoracic spine films           may come to MRI           if needed will try  methylprednisolone instead of prednisone.  Addendum - xray studies were normal. Patient was sent home. No new medications added. Will follow-up with her Monday.  Complete Medication List: 1)  Beyaz 3-0.02-0.451 Mg Tabs (Drospiren-eth estrad-levomefol) .... Daily 2)  Dicyclomine Hcl 10 Mg Caps (Dicyclomine hcl) .Marland Kitchen.. 1 by mouth three times a day. self titrate based on symptoms of ibs   Orders Added: 1)  Est. Patient Level IV [[address]]

## 2010-11-17 ENCOUNTER — Telehealth: Payer: Self-pay | Admitting: Internal Medicine

## 2010-11-21 ENCOUNTER — Telehealth: Payer: Self-pay | Admitting: Internal Medicine

## 2010-11-21 ENCOUNTER — Encounter: Payer: Self-pay | Admitting: Internal Medicine

## 2010-11-22 NOTE — Assessment & Plan Note (Signed)
Summary: ER FU Natale Milch  #   Vital Signs:  Patient profile:   25 year old female Menstrual status:  irregular Height:      60 inches Weight:      172 pounds BMI:     33.71 O2 Sat:      99 % on Room air Temp:     98.0 degrees F oral Pulse rate:   90 / minute BP sitting:   118 / 68  (left arm) Cuff size:   large  Vitals Entered By: Bill Salinas CMA (November 15, 2010 11:12 AM)  O2 Flow:  Room air CC: pt here for ER follow up after having seizure episode yesterday/ ab   Primary Care Provider:  Jacques Navy MD  CC:  pt here for ER follow up after having seizure episode yesterday/ ab.  History of Present Illness: Patient had an episode yesterday, Feb 20th, of a brief loss of consciousness (20-seconds) with some involuntary movement. She had no incontinence of bowel or bladder. She does remember that she aspirated some soda, got a funny feeling in her chest and then lost consciouness. She was slightly confused for several minutes after this event. She does recall a dream like state.   she went immediately to [place]. Record reviewed. she had a normal exam; CT brain without contrast was normal; U/A with sp. gr 1026 otherwise normal, CBC normal, Bmet normal with CO2 of 24. She had a normal EKG. Her symptoms had cleared completely.   She reports that every time she eats she gets a generalized feeling of ill-health, very non-specific: no focal symptoms.   Current Medications (verified): 1)  Beyaz 3-0.02-0.451 Mg Tabs (Drospiren-Eth Estrad-Levomefol) .... Daily 2)  Dicyclomine Hcl 10 Mg Caps (Dicyclomine Hcl) .Marland Kitchen.. 1 By Mouth Three Times A Day. Self Titrate Based On Symptoms of Ibs  Allergies (verified): 1)  ! * Zomig 2)  ! * Topamax 3)  ! * Axert 4)  ! Cipro PMH-FH-SH reviewed-no changes except otherwise noted  Review of Systems  The patient denies anorexia, fever, weight loss, weight gain, decreased hearing, hoarseness, chest pain, syncope, dyspnea on exertion, headaches,  abdominal pain, severe indigestion/heartburn, muscle weakness, difficulty walking, unusual weight change, abnormal bleeding, and angioedema.    Physical Exam  General:  Well-developed,well-nourished,in no acute distress; alert,appropriate and cooperative throughout examination Head:  Normocephalic and atraumatic without obvious abnormalities. No apparent alopecia or balding. Eyes:  vision grossly intact, pupils equal, pupils round, corneas and lenses clear, and no injection.   Neck:  supple, full ROM, no thyromegaly, and no carotid bruits.   Lungs:  normal respiratory effort and normal breath sounds.   Heart:  normal rate and regular rhythm.   Msk:  normal ROM.   Pulses:  2+ radial pulse Neurologic:  alert & oriented X3, cranial nerves II-XII intact, strength normal in all extremities, gait normal, DTRs symmetrical and normal, finger-to-nose normal, heel-to-shin normal, and Romberg negative, normal rapid finger movement, no dysdiadochokinesia.   Skin:  turgor normal, color normal, and no ulcerations.   Cervical Nodes:  no anterior cervical adenopathy and no posterior cervical adenopathy.   Psych:  Oriented X3, memory intact for recent and remote, normally interactive, and good eye contact.     Impression & Recommendations:  Problem # 1:  ALTERED MENTAL STATUS (ICD-780.97) Assessment New Patient with an episode of altered mental status. History without tonic-clonic movement, no significant post-ictal period, nl CO2 in ED within 1 hr of event, nl EKG, CT  brain and lab.  Plan - EEG           Neuro consult to follow. Orders: Neurology Referral (Neuro) Neurology Referral (Neuro)  Complete Medication List: 1)  Beyaz 3-0.02-0.451 Mg Tabs (Drospiren-eth estrad-levomefol) .... Daily 2)  Dicyclomine Hcl 10 Mg Caps (Dicyclomine hcl) .Marland Kitchen.. 1 by mouth three times a day. self titrate based on symptoms of ibs   Orders Added: 1)  Neurology Referral [Neuro] 2)  Neurology Referral [Neuro] 3)  Est.  Patient Level III [[address]]

## 2010-11-22 NOTE — Progress Notes (Signed)
Summary: Frustrated  Phone Note Call from Patient   Summary of Call: Left vm - feels extremely "fuzzy" from head to toe, feels that she can not function properly. Pt feels that she is not getting help form anyone and is very frustrated and worried about her symptoms.   I sent flag to [facility] yesterday that if apt could not be made in the next couple weeks to let Dr Debby Bud know. Notes were faxed to [facility] Neuro today per EMR notes.  Initial call taken by: Lamar Sprinkles, CMA,  November 17, 2010 2:42 PM  Follow-up for Phone Call        working on it Follow-up by: Jacques Navy MD,  November 17, 2010 5:49 PM  Additional Follow-up for Phone Call Additional follow up Details #1::        left vm for pt - I checked w/Debra, she will give me update of referral status this pm..................Marland KitchenLamar Sprinkles, CMA  November 18, 2010 11:59 AM   UPDATE Appt scheduled for  March 2,2012- EEG @10 :00/ arrive 9:30 Appt scheduled with Dr Valetta Mole  March 14,2012@2 :00    Additional Follow-up by: Lamar Sprinkles, CMA,  November 18, 2010 1:55 PM    Additional Follow-up for Phone Call Additional follow up Details #2::    THANK YOU Follow-up by: Jacques Navy MD,  November 18, 2010 3:33 PM

## 2010-11-22 NOTE — Progress Notes (Signed)
Summary: UPDATE -OV TOMORROW  Phone Note Outgoing Call   Reason for Call: Get patient information Summary of Call: Please call patient: is the numbness in the left neck, chest and arm any better. If she is not better will need to see her in the office MONDAY   Thanks Initial call taken by: Jacques Navy MD,  November 06, 2010 6:04 PM  Follow-up for Phone Call        Pt states pain is much better with tylenol every 4 to 6 hours but pt states she is extremely bloated.  Follow-up by: Ami Bullins CMA,  November 07, 2010 5:01 PM  Additional Follow-up for Phone Call Additional follow up Details #1::        bloating is probably the IBS for which she has bentyl Additional Follow-up by: Jacques Navy MD,  November 07, 2010 5:25 PM    Additional Follow-up for Phone Call Additional follow up Details #2::    Called pt to f/u on how she was feeling. She says she had a seizure today and scheduled office visit w/Dr Adaeze Better tomorrow am. Last seizure was when pt was 25 yrs old.  Follow-up by: Lamar Sprinkles, CMA,  November 14, 2010 6:38 PM

## 2010-11-22 NOTE — Progress Notes (Signed)
  Phone Note Call from Patient   Summary of Call: Pt keeps "spacing out" and body feels tingly all over.  Initial call taken by: Lamar Sprinkles, CMA,  November 16, 2010 1:37 PM  Follow-up for Phone Call        MD informed - He Lowella Grip pt should see neuro and get testing. If symptoms increased or changed to let usknow. Pt informed.  Follow-up by: Lamar Sprinkles, CMA,  November 16, 2010 1:43 PM

## 2010-11-25 DIAGNOSIS — Z0279 Encounter for issue of other medical certificate: Secondary | ICD-10-CM

## 2010-11-26 ENCOUNTER — Encounter: Payer: Self-pay | Admitting: Internal Medicine

## 2010-12-01 NOTE — Letter (Signed)
Summary: Out of Work  [facility]  [address] [place], [place] [address]   Phone: [phone]  Fax: [phone]               November 21, 2010   Employee:  [name]    To Whom It May Concern:   For Medical reasons, please excuse the above named employee from work for the following dates:  Start:   November 14, 2010  End:   undetermined.  [name] is being evaluated for transient episodes of change in consciousness with no diagnosis established at this time. She is scheduled for appropriate testing and consultation.  If you need additional information, please feel free to contact our office.         Sincerely,    Jacques Navy MD

## 2010-12-01 NOTE — Progress Notes (Signed)
Summary: Note requested  Phone Note Call from Patient Call back at Home Phone [phone]   Caller: Patient Summary of Call: Pt called and LMOM that she has been put out of work on short term disability due to continuing blackouts.Pt states they need Note for employer and that she will pick up at office and inquiring if OV is needed. Called Pt back & LMOM to let her know that she does not need OV and to callback and give specifics needed for Note. Initial call taken by: Burnard Leigh [facility]),  November 21, 2010 11:47 AM  Follow-up for Phone Call        Pt called back to say needs letter stating what she has been going through, weakness, not feeling well, seein Neurologist to see what is going on with her, cont'd blackouts. When note is finished please fax to: [phone] (her personal fax) If this is okay, please call her ahead to say it is being faxed. If note cannot be faxed please let her know as well. Follow-up by: Verdell Face,  November 21, 2010 12:02 PM  Additional Follow-up for Phone Call Additional follow up Details #1::        letter done. Additional Follow-up by: Jacques Navy MD,  November 21, 2010 6:17 PM    Additional Follow-up for Phone Call Additional follow up Details #2::    Called patient to advise that letter is ready and gonna fax. She stated that she no longer need the letter. Orinigal signed letter voided and phone not closed.Marland KitchenMarland KitchenAlvy Beal Archie CMA  November 22, 2010 2:12 PM

## 2010-12-06 NOTE — Letter (Signed)
Summary: FMLA/[employer]  [employer]   Imported By: Sherian Rein 11/30/2010 11:01:27  _____________________________________________________________________  External Attachment:    Type:   Image     Comment:   External Document

## 2010-12-12 ENCOUNTER — Other Ambulatory Visit: Payer: Self-pay | Admitting: Neurology

## 2010-12-12 DIAGNOSIS — R569 Unspecified convulsions: Secondary | ICD-10-CM

## 2010-12-12 DIAGNOSIS — R292 Abnormal reflex: Secondary | ICD-10-CM

## 2010-12-12 DIAGNOSIS — G253 Myoclonus: Secondary | ICD-10-CM

## 2010-12-15 ENCOUNTER — Ambulatory Visit
Admission: RE | Admit: 2010-12-15 | Discharge: 2010-12-15 | Disposition: A | Discharge status: Home / Self Care | Payer: 59 | Source: Ambulatory Visit | Attending: Neurology | Admitting: Neurology

## 2010-12-15 ENCOUNTER — Other Ambulatory Visit: Payer: Self-pay | Admitting: Neurology

## 2010-12-15 DIAGNOSIS — G253 Myoclonus: Secondary | ICD-10-CM

## 2010-12-15 DIAGNOSIS — R569 Unspecified convulsions: Secondary | ICD-10-CM

## 2010-12-15 DIAGNOSIS — R292 Abnormal reflex: Secondary | ICD-10-CM

## 2011-01-23 ENCOUNTER — Encounter: Payer: Self-pay | Admitting: Internal Medicine

## 2011-02-07 LAB — GC/CHLAMYDIA PROBE AMP, GENITAL: Chlamydia: NEGATIVE

## 2011-02-07 NOTE — Discharge Summary (Signed)
NAME:  [name], [name] NO.:  [id number]   MEDICAL RECORD NO.:  [id number]          PATIENT TYPE:  INP   LOCATION:  9306                          FACILITY:  [facility]   PHYSICIAN:  Freddy Finner, M.D.   DATE OF BIRTH:  [birth year]   DATE OF ADMISSION:  11/15/2007  DATE OF DISCHARGE:  11/18/2007                               DISCHARGE SUMMARY   ADMITTING DIAGNOSES:  1. Intrauterine pregnancy at 14-4/7th weeks' estimated gestational      age.  2. Hyperemesis.  3. Dehydration.   DISCHARGE DIAGNOSES:  1. Intrauterine pregnancy at 14 weeks' estimated gestational age.  2. Medullary sponge kidney.   REASON FOR ADMISSION:  Please see written H and P.   HOSPITAL COURSE:  The patient is a 25 year old prima gravida who was  admitted to [facility] at 14-4/7th weeks' estimated  gestational age with complaints of nausea, vomiting, chest tightness,  shortness of breath, and diarrhea.  The patient had hyperemesis and had  multiple visits to the maternity admission unit.  On admission, vital  signs were stable.  Lungs were clear to auscultation.  Heart was regular  rate and rhythm.  Fetal heart tones were auscultated.  Chest x-ray was  performed which was negative.  The patient was observed to have 3+  ketonuria while in the office.  The patient was now admitted for  management of her hyperemesis and dehydration.  The  patient was started  on IV fluids and was given antiemetics and steroids.  On the following  morning, the patient was doing well.  She never had any further emesis  and desired to go home.  Vital signs were stable.  Abdomen was soft and  nontender.  Later in the afternoon, the patient had complained of some  upper abdominal discomfort, and therefore, it was decided to keep  patient for observation overnight.  The following morning, the patient  did complain of some continued upper abdominal discomfort.  She had  slept well and had not experienced any  further emesis in greater than 12  hours.  Vital signs were stable.  Abdomen soft.  The patient was  experiencing some slight upper mid epigastric discomfort.  She did not  have any rebounding or guarding.  Morning labs were normal.  Amylase and  lipase were pending.  The patient was now made n.p.o. and GI cocktail  was given.  Upper abdominal ultrasound was scheduled.  On the following  morning, the patient did complain of some bilateral back pain, which was  constant.  The patient was noted to be sobbing.  She denied any dysuria,  vaginal bleeding, or loss of fluid.  She had continued not to have any  further emesis.  Vital signs are stable.  She is afebrile.  Abdomen soft  with good bowel sounds.  She had not experienced a BM in 2 days.  Laboratory findings revealed hemoglobin of 11.7, platelet count of 203,  and WBC count of 13.0.  Urinanalysis was within normal limits.  It was  negative for nitrites, negative for leukocyte esterase, and negative  for  hemoglobin.  Cath urine was obtained, which revealed completely normal.  CBC was repeated, which revealed WBC count of 11.6.  Due to the  patient's history of medullary sponge kidney, the patient will be  referred back to Dr. Debby Bud, and she was later discharged home.   CONDITION ON DISCHARGE:  Stable.   DIET:  As tolerated.   FOLLOWUP:  The patient will follow up in the office in 1 week.  She is  to call for fever, increase in nausea or vomiting, preterm contractions  or vaginal bleeding.   DISCHARGE MEDICATIONS:  1. Zofran 4 mg ODT every 6 hours.  2. Vicodin, #30, 1-2 every 4-6 hours.  3. Medrol Dosepak to complete.  4. Phenergan 12.5-mg tablets as needed for nausea.      Julio Sicks, N.P.      Freddy Finner, M.D.  Electronically Signed    CC/MEDQ  D:  12/10/2007  T:  12/10/2007  Job:  [id number]

## 2011-02-07 NOTE — Op Note (Signed)
NAME:  [name], [name] NO.:  [id number]   MEDICAL RECORD NO.:  [id number]          PATIENT TYPE:  OIB   LOCATION:  1621                         FACILITY:  [facility]   PHYSICIAN:  Georges Lynch. Gioffre, M.D.DATE OF BIRTH:  June 08, 1986   DATE OF PROCEDURE:  08/11/2008  DATE OF DISCHARGE:  08/12/2008                               OPERATIVE REPORT   SURGEON:  Windy Fast A. Darrelyn Hillock, M.D.   ASSISTANT:  Marlowe Kays MD   PREOPERATIVE DIAGNOSES:  1. 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  2. 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   POSTOPERATIVE DIAGNOSES:  1. 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  2. 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   OPERATION:  1. Decompression of the lateral recess at L5-S1 on the left for      stenosis.  2. Microdiskectomy for a large herniated disk at L5-S1 on the left.  3. Foraminotomy for the S1 root on the left.   PROCEDURE:  Under general anesthesia, routine orthopedic prep and drape  of the lower back was carried out.  The patient had 1 gram of IV Ancef  preop.  Two needles were placed in the back for localization purposes.  An x-ray was taken.  At this time we identified the L5-S1 space.  An  incision was made over the L5-S1 interspace.  Bleeders identified and  cauterized.  At this particular time, the incision was carried down  through the L5-S1 area.  Bleeders were identified and cauterized.  The  muscle was stripped from the lamina and spinous process on the left.  We  did a partial stripping on the right in order to insert the [facility]  retractors.  Another x-ray was taken with an instrument in the L5-S1  space.  Following that we carried out hemilaminectomy in usual fashion.  The microscope was brought in.  We removed the ligamentum flavum with  great care taken not to injure the underlying dura.  We had to go down  far distally to decompress the S1 root because of the stenosis secondary  to the  large disk rupture.  We had to go out far laterally and  decompressed the lateral recess in order to identify the root and gently  retract the root.  Once this was done, we cauterized the lateral recess  veins.  We then made a cruciate incision in the posterior longitudinal  ligament and a complete microdiskectomy was carried out.  Note, as soon  as we made the incision in the space large amount of disk extruded out  through the space.  We protected dura down and completed the diskectomy.  I inserted the nerve hook as well as the Epstein curettes to make sure  we had no further subligamentous disk material.  The disk did extend  across the midline.  We thoroughly irrigated out the area.  We checked  the lateral recess and the foramina and everything looked fine.  The  root now was easily movable as well as the dura.  There was no other  abnormalities noted.  We had good hemostasis maintained.  We loosely  applied some thrombin-soaked  Gelfoam.  We closed the wound in layers in the usual fashion.  I left a  small deep distal part of the wound open for drainage purposes.  Subcu  was closed with 0 Vicryl, skin with metal staples.  A sterile Neosporin  dressing was applied.  The patient left the operating room in  satisfactory condition.           ______________________________  Georges Lynch Darrelyn Hillock, M.D.     RAG/MEDQ  D:  08/11/2008  T:  08/12/2008  Job:  [id number]

## 2011-02-10 NOTE — Discharge Summary (Signed)
NAME:  [name], [name] NO.:  [id number]   MEDICAL RECORD NO.:  [id number]          PATIENT TYPE:  INP   LOCATION:  9320                          FACILITY:  [facility]   PHYSICIAN:  Michelle L. Grewal, M.D.DATE OF BIRTH:  1986-01-08   DATE OF ADMISSION:  12/17/2007  DATE OF DISCHARGE:  12/20/2007                               DISCHARGE SUMMARY   ADMITTING DIAGNOSES:  1. Intrauterine pregnancy at 19-1/7 weeks' estimated gestational age.  2. Epigastric pain with questionable gastroenteritis.   DISCHARGE DIAGNOSES:  1. Intrauterine pregnancy at 19-4/7 weeks.  2. Epigastric pain, resolved.   REASON FOR ADMISSION:  Please see written H and P.   HOSPITAL COURSE:  The patient is 25 year old primigravida who was  admitted to [facility] at 19-1/7 weeks' estimated  gestational age with complaints of burning in her throat and epigastric  pain.  The patient had experienced some increase in nausea and diarrhea  at the day of admission, she denied any fever.  The patient had  previously been hospitalized in February secondary to nausea and had  been placed on Reglan, Vicodin, Zofran, and Phenergan.  The patient was  initially sent to the maternity admission area where she was  administered 2 liters IV fluids and continued to have difficulty  tolerating p.o. fluids.  The patient was also given a GI cocktail and  Protonix.  The patient's medical history was significant for IBS under  the care of Dr. 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 Debby Bud.  On admission, vital signs were stable.  Fetal heart tones  were positive.  Urinalysis revealed trace proteinuria.  Abdomen is soft,  some tenderness noted in the epigastric area and left upper quadrant  area without rebound and no guarding.  Good bowel sounds were noted.  The patient was now admitted, placed on n.p.o. status, ordered a  gallbladder ultrasound, CBC, amylase, lipase, and CMET was ordered.   On  the following morning, the patient continued to have some epigastric  burning left at the midline.  She continued to have some nausea with  last vomitus previous morning.  Vital signs were stable.  She was  afebrile.  Abdomen was soft with positive epigastric tenderness.  Uterus  was soft.  Fetal heart tones were positive.  Gallbladder ultrasound  results were pending.  Amylase was 54 and lipase was 17.  CBC revealed a  hemoglobin of 10.4, platelet count of 202,000, and WBC count of 8.1.  Potassium was noted to be two times.  Negative for leukocyte esterase,  negative for hemoglobin, and negative for nitrites.  On the following  morning, the patient continued to have some epigastric pain and nausea.  Gallbladder ultrasound was negative.  Potassium was now 3.8.  Dr. Juanda Chance  was called in for consultation.  On the following morning, the patient  was feeling better and desired discharge.  Vital signs were stable.  Abdomen continued to be soft and now nontender.  The patient was  discharged home.   CONDITION ON DISCHARGE:  Stable.   DIET:  Regular as  tolerated.   DISCHARGE MEDICATIONS:  Prescriptions were given for Protonix 40 mg one  p.o. daily, Darvocet-N 100 one every 4 hours as needed for pain, and  prenatal vitamins as tolerated.   FOLLOWUP:  Otherwise, the patient was rescheduled to return to the  office at approximately 1 week.      Julio Sicks, N.P.      Stann Mainland. Vincente Poli, M.D.  Electronically Signed    CC/MEDQ  D:  01/09/2008  T:  01/09/2008  Job:  [id number]

## 2011-02-10 NOTE — Assessment & Plan Note (Signed)
[facility]                                   ON-CALL NOTE   NAME:MINTER, [name]                         MRN:          [id number]  DATE:04/14/2006                            DOB:          02/23/1986    TELEPHONE TRIAGE NOTE:   TIME RECEIVED:  2:01 p.m.   TELEPHONE:  [phone]   HISTORY:  The caller is [name].  She sees Dr. Debby Bud.   The patient has had two days of unremitting fevers up to 103 degrees, also  weakness, continuous vomiting and some back pain.  There has been no  diarrhea.  She is trying to keep fluids down.  Her mother has been giving  her Tylenol but can never get the temperature below 101 degrees.  My advise  is to take her to [facility] now for evaluation.                                   Tera Mater. Clent Ridges, MD   SAF/MedQ  DD:  04/14/2006  DT:  04/14/2006  Job #:  [id number]   cc:   Rosalyn Gess. Norins, MD

## 2011-02-10 NOTE — Assessment & Plan Note (Signed)
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  Her symptoms started last summer, in July 2007, when she was  hospitalized with acute pyelonephritis at [facility], fever and  urinary tract infection.  She was treated with IV antibiotics and  subsequently with oral antibiotics, but really has not felt well since  then.  Her upper abdominal ultrasound, including kidneys, was normal.  At the time, there was no hydronephrosis.  She, since then, has not had  any fever, but has felt tired, usually worse as the day progresses.  She  is very busy, going to school, as well as working at the same time.  Her  weight, however, has remained stable.  There has been no weight-loss.  The pain is localized mostly in the left upper quadrant, radiating to  the back.  It affects how she sleeps at night.  She usually has to sleep  on the right side.   MEDICATIONS:  None.   PAST HISTORY:  Significant for asthmatic bronchitis, pyelonephritis in  July 2007.  No kidney stones found.  She has never seen a urologist.   FAMILY HISTORY:  Positive for diabetes in her sister.   SOCIAL HISTORY:  Single.  Works with parents.  Goes to college.  She  does not smoke, does not drink alcohol.   REVIEW OF SYSTEMS:  Positive for excessive urination, some nocturia,  poor sleeping, increased fatigue.   PHYSICAL EXAM:  Blood pressure 104/60, pulse 76 and weight 135 pounds.  She was alert, oriented, in no distress.  NECK:  Supple without adenopathy.  LUNGS:  Clear to auscultation.  COR:  With quiet precordium, normal S1, normal S2.  ABDOMEN:  Soft, but very tender  in the left costovertebral angle and  left upper quadrant, around.  There was no palpable mass or pulsations.  The right upper quadrant and lateral quadrants were unremarkable.  RECTAL EXAM:  Shows normal perianal area, normal rectal tone.  Stool was  Hemoccult-negative.   LABORATORY DATA:  Lab data from [facility] from July of 2007 were  reviewed.  Her amylase and lipase were normal at that time and her liver  function tests were also normal.   IMPRESSION:  [age]-year-old white female with left upper quadrant  abdominal pain, radiating to the back, and with associated left  costovertebral angle tenderness and history of pyelonephritis.  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  Her GI symptoms may be secondary to chronic  inflammation in her kidneys.  She needs to be first evaluated for her  CVA tenderness before considering further GI evaluation.   PLAN:  1. CT scan of the abdomen with attention to left kidney.  2. Recheck her urine specimen and repeat  urine culture.  3. Consider repeat antibiotic course, depending on her labs.  We will      also check amylase, lipase and TSH, as well as sed rate today.     Hedwig Morton. Juanda Chance, MD  Electronically Signed    DMB/MedQ  DD: 12/07/2006  DT: 12/08/2006  Job #: [id number]   cc:   Rosalyn Gess. Norins, MD

## 2011-02-10 NOTE — Discharge Summary (Signed)
NAME:  [name], CALIP NO.:  [id number]   MEDICAL RECORD NO.:  [id number]          PATIENT TYPE:  INP   LOCATION:  5706                         FACILITY:  [facility]   PHYSICIAN:  Rosalyn Gess. Norins, M.D. LHCDATE OF BIRTH:  11-04-1985   DATE OF ADMISSION:  04/17/2006  DATE OF DISCHARGE:  04/18/2006                                 DISCHARGE SUMMARY   ADMITTING DIAGNOSIS:  Pyelonephritis.   DISCHARGE DIAGNOSES:  1.  Dehydration.  2.  Urinary tract infection, resolving.   HISTORY OF PRESENT ILLNESS:  The patient is a 25 year old who had a couple  days of acute abdominal pain, discomfort and fever.  She was seen on April 14, 2006 in the ER and was found have a temperature 101, positive  urinalysis, white count 10,700.  She was diagnosed with pyelonephritis and  sent home on ciprofloxacin 500 mg b.i.d.  The patient presented to the  office on the day of admission with persistent back pain, abdominal pain,  nausea, poor p.o. intake and dehydration.  She was admitted for IV hydration  and IV antibiotics.  Please see the H&P for past medical history, family  history and social history.   Examination at admission was significant for temperature of 98.5.  The  patient had marked tenderness to percussion over the left flank.  She had  marked tenderness to palpation over the suprapubic area.   HOSPITAL COURSE:  The patient was admitted to a regular bed.  She had  laboratory drawn at the time of admission which revealed her to have an  unremarkable CBC with no significant elevation in white count.  Her basic  metabolic panel was unremarkable with normal renal function.  Renal  ultrasound was performed which revealed no structural abnormalities,  specifically no hydronephrosis or signs of edema.  Urinalysis was  significant for 0-2 WBCs per high-powered field. Leukocyte esterase was  negative.  There was some protein.  Urine culture was negative at one day.  The patient was  hydrated with 500 mL of normal saline at admission over 2  hours and has been on IV with D5 half-normal saline at 125 an hours since  then.  She did receive Toradol.  30 mg IV q.8h. x2 to q.8h. p.r.n.   The patient was started on IV Cipro but had an immediate reaction at the IV  site with erythema and pruritus.  This was stopped and she was switched to  Rocephin 1 gram IV q.24h.  The patient has done well throughout the day.  She has been able take a  diet.  She had been afebrile.  Her pain is markedly improved and she feels  better.   DISCHARGE EXAMINATION:  VITAL SIGNS:  Temperature was 98, blood pressure  106/56, heart rate 74, respirations were 16.  GENERAL APPEARANCE:  This is a well-nourished 25 year old who appears  comfortable.  No further exam conducted.   DISPOSITION:  The patient is discharged home.  She will continue on Ceftin  250 mg b.i.d. for three additional days.  She is given a prescription for  Toradol  10 mg to take q.8h. p.r.n. with a limit of six doses.  The patient  is  instructed that if she has problems with recurrent abdominal pain, back  pain, nausea, vomiting that she is to notify the office and be seen for  follow-up.  She otherwise does well.  She will only return on a p.r.n.  basis.  The patient's condition at time of discharge is stable and improved.           ______________________________  Rosalyn Gess Norins, M.D. [facility]     MEN/MEDQ  D:  04/18/2006  T:  04/18/2006  Job:  [id number]

## 2011-02-10 NOTE — H&P (Signed)
NAME:  [name], [name] NO.:  [id number]   MEDICAL RECORD NO.:  [id number]          PATIENT TYPE:  INP   LOCATION:  5706                         FACILITY:  [facility]   PHYSICIAN:  Rosalyn Gess. Norins, M.D. [facility] OF BIRTH:  16-Mar-1986   DATE OF ADMISSION:  04/17/2006  DATE OF DISCHARGE:                                HISTORY & PHYSICAL   Ms. [name] is a 25 year old who presents to the office today for acute  abdominal pain and discomfort.  She reports that she developed abdominal  pain several days prior with a temperature to 103.  She presented to the ER  on July 21 with a temperature of 101.  She had a positive urinalysis.  White  count was 10,700.  She was diagnosed with pyelonephritis and sent home on  ciprofloxacin 500 mg b.i.d.  The patient presents to the office with  persistent back pain, abdominal pain, nausea, poor p.o. intake, and  dehydration.  She is now admitted for IV hydration, IV antibiotics.   Laboratory evaluation with __________ a urinalysis, urine culture as well as  an ultrasound.   PAST MEDICAL HISTORY:  Surgical none.   Medical:  1.  Chicken pox as a child.  2.  __________.   CURRENT MEDICATIONS:  Depakote, __________.   ALLERGIES:  None.   FAMILY HISTORY:  Negative for colon cancer, breast cancer, __________,  hypertension, __________.   SOCIAL HISTORY:  The patient attempted __________ college.  She is  __________ summer.   REVIEW OF SYSTEMS:  Unremarkable except as in HPI.   PHYSICAL EXAMINATION:  VITAL SIGNS:  Temperature 98.5, blood pressure  112/70, pulse 88, weight 125.  GENERAL:  Well nourished Caucasian female who does appear ill and  uncomfortable.  HEENT:  Head is normocephalic, atraumatic.  __________ EOMs normal.  Conjunctivae and sclerae were clear.  Oropharynx was clear.  Posterior  pharynx was clear.  NECK: Supple without thyromegaly.  NODES:  No adenopathy was noted in the cervical or supraclavicular  regions.  CHEST:  Clear with no rales, wheezes, or rhonchi.  BREAST EXAM:  Deferred.  CARDIOVASCULAR:  The patient has 2+ radial pulses.  She __________ regular  tachycardia without murmurs.  ABDOMEN: The patient has hypoactive bowel sounds.  She had marked tenderness  in the suprapubic area to palpation.  She had severe tenderness to palpation  and percussion over the left flank.  PELVIC/RECTAL:  Exams deferred.  EXTREMITIES: No clubbing, cyanosis, edema, or deformity.  NEUROLOGIC:  Exam was nonfocal.   ASSESSMENT:  Pyelonephritis.  The patient was tried on outpatient medication  and failed with regards to ability to keep down p.o. fluid, manage her  nausea, manage her __________.   PLAN:  The patient is admitted to a regular bed.  Will hydrate giving normal  saline at 250 mL an hour x 500 mL and switching to half normal saline at 125  mL an hour.  Will order CBC with diff, urinalysis with culture, basic  metabolic panel, blood cultures from two sites.  Will do a renal ultrasound  to rule out any  significant architectural change.  We will start  ciprofloxacin 400 mg IV q.12h., Phenergan 12.5 mg IV q.6h., Toradol 30 mg IV  q.8h. x2 doses then p.r.n. q.8h. for pain control.           ______________________________  Rosalyn Gess. Norins, M.D. [facility]     MEN/MEDQ  D:  04/17/2006  T:  04/17/2006  Job:  [id number]

## 2011-02-14 LAB — ABO/RH

## 2011-02-14 LAB — GC/CHLAMYDIA PROBE AMP, GENITAL: Gonorrhea: NEGATIVE

## 2011-02-14 LAB — HIV ANTIBODY (ROUTINE TESTING W REFLEX): HIV: NONREACTIVE

## 2011-02-14 LAB — RPR: RPR: NONREACTIVE

## 2011-02-15 ENCOUNTER — Telehealth: Payer: Self-pay | Admitting: *Deleted

## 2011-02-15 DIAGNOSIS — Q615 Medullary cystic kidney: Secondary | ICD-10-CM

## 2011-02-15 NOTE — Telephone Encounter (Signed)
Referral entered  

## 2011-02-15 NOTE — Telephone Encounter (Signed)
Left vm for pt that req was in process

## 2011-02-15 NOTE — Telephone Encounter (Signed)
Patient requesting referral to see Dr Abel Presto. It has been 3 years since she was seen so they require a referral. She says reason is b/c she is pregnant and needs clearance to get antiinflammatory med to help w/intense LBP.

## 2011-02-22 ENCOUNTER — Inpatient Hospital Stay (HOSPITAL_COMMUNITY)
Admission: EM | Admit: 2011-02-22 | Discharge: 2011-02-22 | Disposition: A | Discharge status: Home / Self Care | Payer: 59 | Source: Ambulatory Visit | Attending: Obstetrics and Gynecology | Admitting: Obstetrics and Gynecology

## 2011-02-22 DIAGNOSIS — R197 Diarrhea, unspecified: Secondary | ICD-10-CM | POA: Insufficient documentation

## 2011-02-22 DIAGNOSIS — O210 Mild hyperemesis gravidarum: Secondary | ICD-10-CM

## 2011-02-22 LAB — URINALYSIS, ROUTINE W REFLEX MICROSCOPIC
Bilirubin Urine: NEGATIVE
Hgb urine dipstick: NEGATIVE
Ketones, ur: 15 mg/dL — AB
Nitrite: NEGATIVE
Specific Gravity, Urine: 1.01 (ref 1.005–1.030)
pH: 7 (ref 5.0–8.0)

## 2011-02-22 LAB — COMPREHENSIVE METABOLIC PANEL
ALT: 14 U/L (ref 0–35)
AST: 12 U/L (ref 0–37)
Albumin: 3.1 g/dL — ABNORMAL LOW (ref 3.5–5.2)
CO2: 23 mEq/L (ref 19–32)
Calcium: 8.5 mg/dL (ref 8.4–10.5)
Chloride: 100 mEq/L (ref 96–112)
GFR calc Af Amer: >60 mL/min (ref >60)
GFR calc non Af Amer: >60 mL/min (ref >60)
Sodium: 135 mEq/L (ref 135–145)

## 2011-02-22 LAB — CBC
Hemoglobin: 11.5 g/dL — ABNORMAL LOW (ref 12.0–15.0)
MCHC: 32.7 g/dL (ref 30.0–36.0)
Platelets: 229 10*3/uL (ref 150–400)
RBC: 4.14 MIL/uL (ref 3.87–5.11)

## 2011-03-20 ENCOUNTER — Inpatient Hospital Stay (HOSPITAL_COMMUNITY)
Admission: AD | Admit: 2011-03-20 | Discharge: 2011-03-21 | Disposition: A | Discharge status: Home / Self Care | Payer: 59 | Source: Ambulatory Visit | Attending: Obstetrics and Gynecology | Admitting: Obstetrics and Gynecology

## 2011-03-20 DIAGNOSIS — O210 Mild hyperemesis gravidarum: Secondary | ICD-10-CM

## 2011-03-20 LAB — URINALYSIS, ROUTINE W REFLEX MICROSCOPIC
Bilirubin Urine: NEGATIVE
Glucose, UA: NEGATIVE mg/dL
Hgb urine dipstick: NEGATIVE
Specific Gravity, Urine: 1.02 (ref 1.005–1.030)
Urobilinogen, UA: 0.2 mg/dL (ref 0.0–1.0)
pH: 7 (ref 5.0–8.0)

## 2011-04-28 ENCOUNTER — Other Ambulatory Visit (HOSPITAL_COMMUNITY): Payer: Self-pay | Admitting: Obstetrics and Gynecology

## 2011-04-28 DIAGNOSIS — Z3689 Encounter for other specified antenatal screening: Secondary | ICD-10-CM

## 2011-04-28 DIAGNOSIS — G40909 Epilepsy, unspecified, not intractable, without status epilepticus: Secondary | ICD-10-CM

## 2011-05-02 ENCOUNTER — Ambulatory Visit (HOSPITAL_COMMUNITY): Payer: 59

## 2011-05-04 ENCOUNTER — Ambulatory Visit (HOSPITAL_COMMUNITY)
Admission: RE | Admit: 2011-05-04 | Discharge: 2011-05-04 | Disposition: A | Discharge status: Home / Self Care | Payer: 59 | Source: Ambulatory Visit | Attending: Obstetrics and Gynecology | Admitting: Obstetrics and Gynecology

## 2011-05-04 ENCOUNTER — Encounter (HOSPITAL_COMMUNITY): Payer: Self-pay

## 2011-05-04 VITALS — BP 130/73 | HR 93 | Wt 177.0 lb

## 2011-05-04 DIAGNOSIS — Q615 Medullary cystic kidney: Secondary | ICD-10-CM

## 2011-05-04 DIAGNOSIS — G40909 Epilepsy, unspecified, not intractable, without status epilepticus: Secondary | ICD-10-CM | POA: Insufficient documentation

## 2011-05-04 DIAGNOSIS — Z3689 Encounter for other specified antenatal screening: Secondary | ICD-10-CM

## 2011-05-04 DIAGNOSIS — N12 Tubulo-interstitial nephritis, not specified as acute or chronic: Secondary | ICD-10-CM

## 2011-05-04 DIAGNOSIS — O358XX0 Maternal care for other (suspected) fetal abnormality and damage, not applicable or unspecified: Secondary | ICD-10-CM | POA: Insufficient documentation

## 2011-05-04 DIAGNOSIS — J45901 Unspecified asthma with (acute) exacerbation: Secondary | ICD-10-CM

## 2011-05-04 NOTE — Progress Notes (Signed)
Ultrasound in AS/OBGYN/EPIC.  Follow up U/S scheduled 3 weeks. 

## 2011-05-04 NOTE — ED Notes (Signed)
Pt states she had her last "big" seizure on 2.20.12.  She stopped taking her Lamictal XR at the end of May when she found out she was pregnant.

## 2011-05-25 ENCOUNTER — Ambulatory Visit (HOSPITAL_COMMUNITY)
Admission: RE | Admit: 2011-05-25 | Discharge: 2011-05-25 | Disposition: A | Discharge status: Home / Self Care | Payer: 59 | Source: Ambulatory Visit | Attending: Obstetrics and Gynecology | Admitting: Obstetrics and Gynecology

## 2011-05-25 DIAGNOSIS — G40909 Epilepsy, unspecified, not intractable, without status epilepticus: Secondary | ICD-10-CM

## 2011-05-25 NOTE — Progress Notes (Signed)
Vital signs reviewed; see ultrasound report 

## 2011-06-01 ENCOUNTER — Other Ambulatory Visit: Payer: Self-pay | Admitting: Nephrology

## 2011-06-01 DIAGNOSIS — M549 Dorsalgia, unspecified: Secondary | ICD-10-CM

## 2011-06-07 ENCOUNTER — Encounter: Payer: Self-pay | Admitting: Internal Medicine

## 2011-06-07 ENCOUNTER — Ambulatory Visit (INDEPENDENT_AMBULATORY_CARE_PROVIDER_SITE_OTHER): Payer: 59 | Admitting: Internal Medicine

## 2011-06-07 ENCOUNTER — Other Ambulatory Visit: Payer: 59

## 2011-06-07 DIAGNOSIS — O26899 Other specified pregnancy related conditions, unspecified trimester: Secondary | ICD-10-CM

## 2011-06-07 DIAGNOSIS — K219 Gastro-esophageal reflux disease without esophagitis: Secondary | ICD-10-CM

## 2011-06-07 MED ORDER — RANITIDINE HCL 150 MG PO TABS: 150.0000 mg | ORAL_TABLET | Freq: Two times a day (BID) | ORAL | Status: DC

## 2011-06-07 NOTE — Patient Instructions (Addendum)
It was good to see you today. If you develop worsening symptoms or fever, call and we can reconsider antibiotics, but it does not appear necessary to use antibiotics at this time. Add max dose generic Zantac for reflux to help control cough symptoms - Your prescription(s) have been submitted to your pharmacy. Please take as directed and contact our office if you believe you are having problem(s) with the medication(s). Continue other cough suppression methods as you are doing or as directed by your OB

## 2011-06-07 NOTE — Progress Notes (Signed)
  Subjective:    Patient ID: [name], female    DOB: 1986-02-22, 25 y.o.   MRN: [id number]  HPI  complains of cough Onset 2 weeks ago associated with acid reflux (pregancy 6 mo) No sputum, no fever No SOB, CP or edema or le swelling  Past Medical History  Diagnosis Date  . Medullary sponge kidney   . Absence of menstruation 10/27/2009  . ASTHMA NOS W/ACUTE EXACERBATION 01/05/2010  . MIGRAINES, HX OF 06/24/2007  . PYELONEPHRITIS 06/24/2007    Review of Systems  Genitourinary: Negative for dysuria and hematuria.  Neurological: Negative for seizures and light-headedness.  Psychiatric/Behavioral: Negative for confusion.       Objective:   Physical Exam BP 128/60  Pulse 96  Temp(Src) 98.6 F (37 C) (Oral)  Ht 5\' 4"  (1.626 m)  SpO2 97%  LMP 12/11/2010 Constitutional: She is pregnant. She appears well-developed and well-nourished. No distress.  Eyes: Conjunctivae and EOM are normal. Pupils are equal, round, and reactive to light. No scleral icterus.  Neck: Normal range of motion. Neck supple. No JVD present. No thyromegaly present.  Cardiovascular: Normal rate, regular rhythm and normal heart sounds.  No murmur heard. No BLE edema. Pulmonary/Chest: Effort normal and breath sounds normal. No respiratory distress. She has no wheezes.  Abdominal: Soft. Bowel sounds are normal. She exhibits no distension. There is no tenderness. no masses Psychiatric: She has a normal mood and affect. Her behavior is normal. Judgment and thought content normal.      Assessment & Plan:  Cough - appears GERD related - (worse at night/supine, no sputum, acid burn) - ongoing x 2 weeks -  Lungs clear, afeb, ENT exam benign - add max H2B and continue other cough suppression as ongoing - no role for abx at this time but pt will call here or OB if worse or unimproved - pt education provided

## 2011-06-08 ENCOUNTER — Ambulatory Visit
Admission: RE | Admit: 2011-06-08 | Discharge: 2011-06-08 | Disposition: A | Discharge status: Home / Self Care | Payer: 59 | Source: Ambulatory Visit | Attending: Nephrology | Admitting: Nephrology

## 2011-06-08 DIAGNOSIS — M549 Dorsalgia, unspecified: Secondary | ICD-10-CM

## 2011-06-16 LAB — URINALYSIS, ROUTINE W REFLEX MICROSCOPIC
Bilirubin Urine: NEGATIVE
Bilirubin Urine: NEGATIVE
Glucose, UA: NEGATIVE
Hgb urine dipstick: NEGATIVE
Hgb urine dipstick: NEGATIVE
Ketones, ur: >80 — AB
Nitrite: NEGATIVE
Nitrite: NEGATIVE
Protein, ur: NEGATIVE
Protein, ur: NEGATIVE
Specific Gravity, Urine: 1.02
Specific Gravity, Urine: >1.03 — ABNORMAL HIGH
Urobilinogen, UA: 1
Urobilinogen, UA: 0.2
Urobilinogen, UA: 0.2
pH: 7

## 2011-06-16 LAB — COMPREHENSIVE METABOLIC PANEL
ALT: 12
ALT: 15
ALT: 16
AST: 15
AST: 16
AST: 18
Albumin: 3 — ABNORMAL LOW
Albumin: 3.3 — ABNORMAL LOW
Albumin: 3.3 — ABNORMAL LOW
Alkaline Phosphatase: 45
Alkaline Phosphatase: 57
BUN: 4 — ABNORMAL LOW
CO2: 23
Calcium: 8.8
Calcium: 9.1
Calcium: 9.2
Chloride: 102
Chloride: 108
Creatinine, Ser: 0.48
Creatinine, Ser: 0.5
GFR calc Af Amer: >60
GFR calc Af Amer: >60
GFR calc Af Amer: >60
GFR calc non Af Amer: >60
Potassium: 3.9
Potassium: 3.6
Sodium: 137
Sodium: 135
Sodium: 137
Total Bilirubin: 0.3
Total Bilirubin: 0.4
Total Protein: 6.1
Total Protein: 7

## 2011-06-16 LAB — URINE CULTURE
Colony Count: 25000
Colony Count: 25000

## 2011-06-16 LAB — CBC
HCT: 34.4 — ABNORMAL LOW
Hemoglobin: 11.7 — ABNORMAL LOW
Hemoglobin: 11.7 — ABNORMAL LOW
MCHC: 35.3
MCHC: 35.5
MCV: 86.8
MCV: 87.3
MCV: 88.3
Platelets: 213
Platelets: 217
RBC: 3.84 — ABNORMAL LOW
RBC: 3.72 — ABNORMAL LOW
RBC: 3.74 — ABNORMAL LOW
RDW: 12.7
RDW: 12.7
RDW: 12.8
WBC: 7.4
WBC: 13 — ABNORMAL HIGH

## 2011-06-16 LAB — URINALYSIS, DIPSTICK ONLY
Bilirubin Urine: NEGATIVE
Glucose, UA: NEGATIVE
Hgb urine dipstick: NEGATIVE
Ketones, ur: >80 — AB
Nitrite: POSITIVE — AB
Specific Gravity, Urine: >1.03 — ABNORMAL HIGH
pH: 6

## 2011-06-16 LAB — DIFFERENTIAL
Basophils Relative: 1
Eosinophils Absolute: 0
Lymphs Abs: 1.3
Monocytes Absolute: 0.3
Monocytes Relative: 5
Neutrophils Relative %: 77

## 2011-06-19 ENCOUNTER — Inpatient Hospital Stay (HOSPITAL_COMMUNITY)
Admission: AD | Admit: 2011-06-19 | Discharge: 2011-06-19 | Disposition: A | Discharge status: Home / Self Care | Payer: 59 | Source: Ambulatory Visit | Attending: Obstetrics and Gynecology | Admitting: Obstetrics and Gynecology

## 2011-06-19 DIAGNOSIS — R1013 Epigastric pain: Secondary | ICD-10-CM | POA: Insufficient documentation

## 2011-06-19 DIAGNOSIS — K219 Gastro-esophageal reflux disease without esophagitis: Secondary | ICD-10-CM | POA: Insufficient documentation

## 2011-06-19 DIAGNOSIS — R079 Chest pain, unspecified: Secondary | ICD-10-CM | POA: Insufficient documentation

## 2011-06-19 LAB — DIFFERENTIAL
Eosinophils Relative: 0
Lymphocytes Relative: 19
Lymphs Abs: 1.6
Neutro Abs: 6
Neutrophils Relative %: 75

## 2011-06-19 LAB — URINALYSIS, ROUTINE W REFLEX MICROSCOPIC
Bilirubin Urine: NEGATIVE
Hgb urine dipstick: NEGATIVE
Hgb urine dipstick: NEGATIVE
Ketones, ur: 40 mg/dL — AB
Nitrite: NEGATIVE
Specific Gravity, Urine: 1.015
Urobilinogen, UA: 0.2
Urobilinogen, UA: 1 mg/dL (ref 0.0–1.0)

## 2011-06-19 LAB — AMYLASE: Amylase: 54

## 2011-06-19 LAB — COMPREHENSIVE METABOLIC PANEL
ALT: 15
AST: 18
Albumin: 2.6 — ABNORMAL LOW
Alkaline Phosphatase: 53
Chloride: 108
GFR calc Af Amer: >60
Potassium: 3.2 — ABNORMAL LOW
Sodium: 137
Total Bilirubin: 0.5
Total Protein: 5.5 — ABNORMAL LOW

## 2011-06-19 LAB — CBC
HCT: 29.1 — ABNORMAL LOW
Platelets: 202
WBC: 8.1

## 2011-06-19 LAB — LIPASE, BLOOD: Lipase: 17

## 2011-06-19 MED ORDER — GI COCKTAIL ~~LOC~~
30.0000 mL | Freq: Three times a day (TID) | ORAL | Status: DC | PRN
  Administered 2011-06-19: 30 mL via ORAL
  Filled 2011-06-19: qty 30

## 2011-06-19 NOTE — Consult Note (Signed)
**Note De-Identified [name] Obfuscation** Patient was sitting in bed on room air with SATS >95%, BBS =/CLR, no cyanosis noted.

## 2011-06-19 NOTE — Progress Notes (Signed)
Pt states that earlier she felt SOB and her chest felt tight-states that has resolved now-states the pain on her rt lower side is gone also-states she does feel tight in her lungs rt now-note her resp are even and reg-color good-no signs of distress or discomfort

## 2011-06-19 NOTE — Progress Notes (Signed)
Pt states, " At 4 :20 pm I started feeling dizzy, and then I became short of breath and my chest started hurting.About an hour ago I started having pain in my right lower abdomen."

## 2011-06-19 NOTE — ED Provider Notes (Signed)
History   In with c/o epigastric pain and chest pain which has resolved since arrival to unit.  Chief Complaint  Patient presents with  . Chest Pain  . Shortness of Breath  . Dizziness   HPI  OB History    Grav Para Term Preterm Abortions TAB SAB Ect Mult Living   2 1 1  0 0 0 0 0 0 1      Past Medical History  Diagnosis Date  . Medullary sponge kidney   . Absence of menstruation 10/27/2009  . ASTHMA NOS W/ACUTE EXACERBATION 01/05/2010  . MIGRAINES, HX OF 06/24/2007  . PYELONEPHRITIS 06/24/2007    No past surgical history on file.  No family history on file.  History  Substance Use Topics  . Smoking status: Never Smoker   . Smokeless tobacco: Not on file  . Alcohol Use: No    Allergies:  Allergies  Allergen Reactions  . Ciprofloxacin Other (See Comments)    Patient stated that her arm started burning really intensely.  . Nsaids Other (See Comments)    Patient states that she cant remember what her reaction to the medication was.  . Topiramate   . Zolmitriptan     Prescriptions prior to admission  Medication Sig Dispense Refill  . lamoTRIgine (LAMICTAL) 100 MG tablet Take 100 mg by mouth daily.        . Promethazine HCl (PHENERGAN PO) Take by mouth.        . ranitidine (ZANTAC) 150 MG tablet Take 1 tablet (150 mg total) by mouth 2 (two) times daily.  60 tablet  1  . ibuprofen (ADVIL,MOTRIN) 400 MG tablet Take 400 mg by mouth. Take 2 tablets twice daily for back and headache pain         Review of Systems  Constitutional: Negative.   HENT: Negative.   Eyes: Negative.   Respiratory: Negative.   Cardiovascular: Negative.   Gastrointestinal: Negative.   Genitourinary: Negative.   Musculoskeletal: Negative.   Skin: Negative.   Neurological: Negative.   Endo/Heme/Allergies: Negative.   Psychiatric/Behavioral: Negative.    Physical Exam   Blood pressure 121/64, pulse 102, temperature 98.9 F (37.2 C), temperature source Oral, resp. rate 20, height 5' (1.524  m), weight 82.725 kg (182 lb 6 oz), last menstrual period 12/11/2010, SpO2 98.00%.  Physical Exam  Constitutional: She is oriented to person, place, and time. She appears well-developed and well-nourished.  Neck: Normal range of motion.  Cardiovascular: Normal rate, regular rhythm, normal heart sounds and intact distal pulses.   Respiratory: Effort normal and breath sounds normal.  GI: Soft. Bowel sounds are normal.  Genitourinary: Vagina normal and uterus normal.  Musculoskeletal: Normal range of motion.  Neurological: She is alert and oriented to person, place, and time. She has normal reflexes.  Skin: Skin is warm and dry.  Psychiatric: She has a normal mood and affect. Her behavior is normal. Judgment and thought content normal.    MAU Course  Procedures  MDM   Assessment and Plan  Stable maternal-fetal unit. GERD. GI cocktail. POC discussed with Dr. Vincente Poli ans pt ok for discharge.  Zerita Boers 06/19/2011, 8:16 PM

## 2011-06-20 LAB — CBC
Hemoglobin: 11.8 — ABNORMAL LOW
MCHC: 35.5
MCV: 89.9
RDW: 14.1

## 2011-06-20 LAB — COMPREHENSIVE METABOLIC PANEL
ALT: 16
Calcium: 9
Creatinine, Ser: 0.53
GFR calc non Af Amer: >60
Glucose, Bld: 86
Sodium: 138
Total Protein: 6.7

## 2011-06-22 LAB — URINALYSIS, ROUTINE W REFLEX MICROSCOPIC
Glucose, UA: NEGATIVE
Ketones, ur: 15 — AB
Nitrite: NEGATIVE
Specific Gravity, Urine: >1.03 — ABNORMAL HIGH
pH: 5

## 2011-06-23 LAB — CBC
HCT: 30.6 — ABNORMAL LOW
HCT: 32.1 — ABNORMAL LOW
Hemoglobin: 10.4 — ABNORMAL LOW
Hemoglobin: 10.9 — ABNORMAL LOW
MCV: 85.2
Platelets: 215
RBC: 3.51 — ABNORMAL LOW
RBC: 3.77 — ABNORMAL LOW
WBC: 9.3
WBC: 10.7 — ABNORMAL HIGH

## 2011-06-23 LAB — KLEIHAUER-BETKE STAIN: Quantitation Fetal Hemoglobin: 0

## 2011-06-27 LAB — COMPREHENSIVE METABOLIC PANEL
BUN: 9
CO2: 29
Calcium: 9.6
Chloride: 104
Creatinine, Ser: 0.79
GFR calc non Af Amer: >60
Total Bilirubin: 0.3

## 2011-06-27 LAB — CBC
HCT: 38.1
MCHC: 32.8
MCV: 82.5
RBC: 4.62
WBC: 8.3

## 2011-06-27 LAB — URINE MICROSCOPIC-ADD ON

## 2011-06-27 LAB — APTT: aPTT: 26

## 2011-06-27 LAB — DIFFERENTIAL
Basophils Absolute: 0
Lymphocytes Relative: 21
Lymphs Abs: 1.8
Neutro Abs: 6
Neutrophils Relative %: 73

## 2011-06-27 LAB — URINALYSIS, ROUTINE W REFLEX MICROSCOPIC
Glucose, UA: NEGATIVE
Leukocytes, UA: NEGATIVE
Protein, ur: NEGATIVE
Specific Gravity, Urine: 1.003 — ABNORMAL LOW

## 2011-06-27 LAB — PROTIME-INR
INR: 1
Prothrombin Time: 12.8

## 2011-06-27 LAB — URINE CULTURE
Colony Count: 9000
Special Requests: NEGATIVE

## 2011-08-28 ENCOUNTER — Observation Stay (HOSPITAL_COMMUNITY)
Admission: AD | Admit: 2011-08-28 | Discharge: 2011-08-29 | Disposition: A | Discharge status: Home / Self Care | Payer: 59 | Source: Ambulatory Visit | Attending: Obstetrics and Gynecology | Admitting: Obstetrics and Gynecology

## 2011-08-28 ENCOUNTER — Encounter (HOSPITAL_COMMUNITY): Payer: Self-pay | Admitting: *Deleted

## 2011-08-28 DIAGNOSIS — O479 False labor, unspecified: Principal | ICD-10-CM | POA: Insufficient documentation

## 2011-08-28 HISTORY — DX: Unspecified convulsions: R56.9

## 2011-08-28 LAB — CBC
Hemoglobin: 9.6 g/dL — ABNORMAL LOW (ref 12.0–15.0)
MCH: 26.1 pg (ref 26.0–34.0)
MCHC: 32.4 g/dL (ref 30.0–36.0)
RDW: 13.3 % (ref 11.5–15.5)

## 2011-08-28 MED ORDER — LACTATED RINGERS IV SOLN: 500.0000 mL | INTRAVENOUS | Status: DC | PRN

## 2011-08-28 MED ORDER — CITRIC ACID-SODIUM CITRATE 334-500 MG/5ML PO SOLN: 30.0000 mL | ORAL | Status: DC | PRN

## 2011-08-28 MED ORDER — ONDANSETRON HCL 4 MG/2ML IJ SOLN: 4.0000 mg | Freq: Four times a day (QID) | INTRAMUSCULAR | Status: DC | PRN

## 2011-08-28 MED ORDER — LAMOTRIGINE 25 MG PO TABS
25.0000 mg | ORAL_TABLET | Freq: Once | ORAL | Status: DC
  Filled 2011-08-28: qty 1

## 2011-08-28 MED ORDER — ACETAMINOPHEN 325 MG PO TABS
650.0000 mg | ORAL_TABLET | ORAL | Status: DC | PRN
  Administered 2011-08-29: 650 mg via ORAL
  Filled 2011-08-28 (×2): qty 1

## 2011-08-28 MED ORDER — LIDOCAINE HCL (PF) 1 % IJ SOLN: 30.0000 mL | INTRAMUSCULAR | Status: DC | PRN

## 2011-08-28 MED ORDER — OXYTOCIN 20 UNITS IN LACTATED RINGERS INFUSION - SIMPLE: 125.0000 mL/h | Freq: Once | INTRAVENOUS | Status: DC

## 2011-08-28 MED ORDER — FLEET ENEMA 7-19 GM/118ML RE ENEM: 1.0000 | ENEMA | RECTAL | Status: DC | PRN

## 2011-08-28 MED ORDER — OXYCODONE-ACETAMINOPHEN 5-325 MG PO TABS: 2.0000 | ORAL_TABLET | ORAL | Status: DC | PRN

## 2011-08-28 MED ORDER — LACTATED RINGERS IV SOLN
INTRAVENOUS | Status: DC
  Administered 2011-08-28: 1000 mL via INTRAVENOUS
  Administered 2011-08-28: via INTRAVENOUS

## 2011-08-28 MED ORDER — OXYTOCIN BOLUS FROM INFUSION
500.0000 mL | Freq: Once | INTRAVENOUS | Status: DC
  Filled 2011-08-28: qty 500

## 2011-08-28 NOTE — ED Notes (Signed)
Pt. Taken ambulated to room 66 on BS.

## 2011-08-29 LAB — RPR: RPR Ser Ql: NONREACTIVE

## 2011-08-29 MED ORDER — LAMOTRIGINE 25 MG PO TABS: 25.0000 mg | ORAL_TABLET | Freq: Once | ORAL | Status: DC

## 2011-08-29 NOTE — Progress Notes (Signed)
Pt was never admitted but observed in BS She now has 1-2 ctx an hour and slept through the night VSSAF FHR 140s with accels  Cx: 3/50/-3 last HS  False Labor DC home with fu in office in 2 days as scheduled DL

## 2011-09-01 ENCOUNTER — Encounter (HOSPITAL_COMMUNITY): Payer: Self-pay | Admitting: *Deleted

## 2011-09-01 ENCOUNTER — Inpatient Hospital Stay (HOSPITAL_COMMUNITY)
Admission: AD | Admit: 2011-09-01 | Discharge: 2011-09-04 | DRG: 775 | Disposition: A | Discharge status: Home / Self Care | Payer: 59 | Source: Ambulatory Visit | Attending: Obstetrics and Gynecology | Admitting: Obstetrics and Gynecology

## 2011-09-01 DIAGNOSIS — J45901 Unspecified asthma with (acute) exacerbation: Secondary | ICD-10-CM

## 2011-09-01 DIAGNOSIS — N12 Tubulo-interstitial nephritis, not specified as acute or chronic: Secondary | ICD-10-CM

## 2011-09-01 DIAGNOSIS — N912 Amenorrhea, unspecified: Secondary | ICD-10-CM

## 2011-09-01 DIAGNOSIS — Z87898 Personal history of other specified conditions: Secondary | ICD-10-CM

## 2011-09-01 DIAGNOSIS — Q615 Medullary cystic kidney: Secondary | ICD-10-CM

## 2011-09-01 HISTORY — DX: Calculus of gallbladder without cholecystitis without obstruction: K80.20

## 2011-09-01 LAB — CBC
HCT: 30.3 % — ABNORMAL LOW (ref 36.0–46.0)
Hemoglobin: 9.4 g/dL — ABNORMAL LOW (ref 12.0–15.0)
MCV: 80.8 fL (ref 78.0–100.0)
WBC: 10.8 10*3/uL — ABNORMAL HIGH (ref 4.0–10.5)

## 2011-09-01 MED ORDER — IBUPROFEN 600 MG PO TABS: 600.0000 mg | ORAL_TABLET | Freq: Four times a day (QID) | ORAL | Status: DC | PRN

## 2011-09-01 MED ORDER — LACTATED RINGERS IV SOLN
INTRAVENOUS | Status: DC
  Administered 2011-09-02: 01:00:00 via INTRAVENOUS

## 2011-09-01 MED ORDER — LACTATED RINGERS IV SOLN: 500.0000 mL | INTRAVENOUS | Status: DC | PRN

## 2011-09-01 MED ORDER — ACETAMINOPHEN 325 MG PO TABS: 650.0000 mg | ORAL_TABLET | ORAL | Status: DC | PRN

## 2011-09-01 MED ORDER — LACTATED RINGERS IV SOLN: INTRAVENOUS | Status: DC

## 2011-09-01 MED ORDER — OXYTOCIN 20 UNITS IN LACTATED RINGERS INFUSION - SIMPLE
125.0000 mL/h | Freq: Once | INTRAVENOUS | Status: AC
  Administered 2011-09-02: 999 mL/h via INTRAVENOUS
  Filled 2011-09-01: qty 1000

## 2011-09-01 MED ORDER — BUTORPHANOL TARTRATE 2 MG/ML IJ SOLN: 1.0000 mg | INTRAMUSCULAR | Status: DC | PRN

## 2011-09-01 MED ORDER — OXYCODONE-ACETAMINOPHEN 5-325 MG PO TABS: 2.0000 | ORAL_TABLET | ORAL | Status: DC | PRN

## 2011-09-01 MED ORDER — ONDANSETRON HCL 4 MG/2ML IJ SOLN
4.0000 mg | Freq: Four times a day (QID) | INTRAMUSCULAR | Status: DC | PRN
  Administered 2011-09-02: 4 mg via INTRAVENOUS
  Filled 2011-09-01: qty 2

## 2011-09-01 MED ORDER — CITRIC ACID-SODIUM CITRATE 334-500 MG/5ML PO SOLN: 30.0000 mL | ORAL | Status: DC | PRN

## 2011-09-01 MED ORDER — LAMOTRIGINE 25 MG PO TABS: 25.0000 mg | ORAL_TABLET | Freq: Every day | ORAL | Status: DC

## 2011-09-01 MED ORDER — OXYTOCIN 20 UNITS IN LACTATED RINGERS INFUSION - SIMPLE
1.0000 m[IU]/min | INTRAVENOUS | Status: DC
  Administered 2011-09-01: 2 m[IU]/min via INTRAVENOUS

## 2011-09-01 MED ORDER — FLEET ENEMA 7-19 GM/118ML RE ENEM: 1.0000 | ENEMA | RECTAL | Status: DC | PRN

## 2011-09-01 MED ORDER — OXYTOCIN BOLUS FROM INFUSION
500.0000 mL | Freq: Once | INTRAVENOUS | Status: DC
  Filled 2011-09-01: qty 500

## 2011-09-01 MED ORDER — LAMOTRIGINE ER 25 MG PO TB24
25.0000 mg | ORAL_TABLET | Freq: Every day | ORAL | Status: DC
  Administered 2011-09-01 – 2011-09-03 (×3): 25 mg via ORAL
  Filled 2011-09-01: qty 1

## 2011-09-01 MED ORDER — TERBUTALINE SULFATE 1 MG/ML IJ SOLN: 0.2500 mg | Freq: Once | INTRAMUSCULAR | Status: AC | PRN

## 2011-09-01 MED ORDER — LIDOCAINE HCL (PF) 1 % IJ SOLN
30.0000 mL | INTRAMUSCULAR | Status: DC | PRN
  Filled 2011-09-01: qty 30

## 2011-09-01 NOTE — H&P (Signed)
25 yo G2P1 presents w/ SROM.  No ctx or vb.  Pregnancy uncomplicated.  Past history - see hollister, G BS neg  AF, VSS  Gen - NAD Abd - gravid, NT Ext - NT  A/P:  SROM Admit Ext mngt - will augment if no active labor

## 2011-09-01 NOTE — Progress Notes (Signed)
Pt states, " I woke up at 0530 and my panties were saturated. Since then I've changed five or six pads that were soaked."

## 2011-09-02 ENCOUNTER — Encounter (HOSPITAL_COMMUNITY): Payer: Self-pay | Admitting: *Deleted

## 2011-09-02 ENCOUNTER — Inpatient Hospital Stay (HOSPITAL_COMMUNITY): Payer: 59 | Admitting: Anesthesiology

## 2011-09-02 ENCOUNTER — Encounter (HOSPITAL_COMMUNITY): Payer: Self-pay | Admitting: Anesthesiology

## 2011-09-02 MED ORDER — LANOLIN HYDROUS EX OINT: TOPICAL_OINTMENT | CUTANEOUS | Status: DC | PRN

## 2011-09-02 MED ORDER — ONDANSETRON HCL 4 MG/2ML IJ SOLN: 4.0000 mg | INTRAMUSCULAR | Status: DC | PRN

## 2011-09-02 MED ORDER — PRENATAL PLUS 27-1 MG PO TABS
1.0000 | ORAL_TABLET | Freq: Every day | ORAL | Status: DC
  Filled 2011-09-02: qty 1

## 2011-09-02 MED ORDER — SENNOSIDES-DOCUSATE SODIUM 8.6-50 MG PO TABS
2.0000 | ORAL_TABLET | Freq: Every day | ORAL | Status: DC
  Administered 2011-09-02 – 2011-09-03 (×2): 2 via ORAL

## 2011-09-02 MED ORDER — DIPHENHYDRAMINE HCL 50 MG/ML IJ SOLN: 12.5000 mg | INTRAMUSCULAR | Status: DC | PRN

## 2011-09-02 MED ORDER — ALBUTEROL SULFATE HFA 108 (90 BASE) MCG/ACT IN AERS
2.0000 | INHALATION_SPRAY | Freq: Four times a day (QID) | RESPIRATORY_TRACT | Status: DC | PRN
  Filled 2011-09-02: qty 6.7

## 2011-09-02 MED ORDER — LIDOCAINE HCL 1.5 % IJ SOLN
INTRAMUSCULAR | Status: DC | PRN
  Administered 2011-09-02: 2 mL via INTRADERMAL
  Administered 2011-09-02 (×2): 5 mL via INTRADERMAL

## 2011-09-02 MED ORDER — ONDANSETRON HCL 4 MG PO TABS: 4.0000 mg | ORAL_TABLET | ORAL | Status: DC | PRN

## 2011-09-02 MED ORDER — DIBUCAINE 1 % RE OINT: 1.0000 "application " | TOPICAL_OINTMENT | RECTAL | Status: DC | PRN

## 2011-09-02 MED ORDER — LACTATED RINGERS IV SOLN
500.0000 mL | Freq: Once | INTRAVENOUS | Status: AC
  Administered 2011-09-02: 500 mL via INTRAVENOUS

## 2011-09-02 MED ORDER — PHENYLEPHRINE 40 MCG/ML (10ML) SYRINGE FOR IV PUSH (FOR BLOOD PRESSURE SUPPORT): 80.0000 ug | PREFILLED_SYRINGE | INTRAVENOUS | Status: DC | PRN

## 2011-09-02 MED ORDER — PANTOPRAZOLE SODIUM 40 MG PO TBEC
40.0000 mg | DELAYED_RELEASE_TABLET | Freq: Every day | ORAL | Status: DC
  Filled 2011-09-02 (×3): qty 1

## 2011-09-02 MED ORDER — WITCH HAZEL-GLYCERIN EX PADS: 1.0000 "application " | MEDICATED_PAD | CUTANEOUS | Status: DC | PRN

## 2011-09-02 MED ORDER — FENTANYL 2.5 MCG/ML BUPIVACAINE 1/10 % EPIDURAL INFUSION (WH - ANES)
14.0000 mL/h | INTRAMUSCULAR | Status: DC
  Administered 2011-09-02 (×2): 14 mL/h via EPIDURAL
  Filled 2011-09-02 (×2): qty 60

## 2011-09-02 MED ORDER — OXYCODONE-ACETAMINOPHEN 5-325 MG PO TABS
1.0000 | ORAL_TABLET | ORAL | Status: DC | PRN
  Administered 2011-09-02: 2 via ORAL
  Administered 2011-09-02: 1 via ORAL
  Administered 2011-09-03: 2 via ORAL
  Administered 2011-09-03 – 2011-09-04 (×3): 1 via ORAL
  Filled 2011-09-02: qty 2
  Filled 2011-09-02 (×2): qty 1
  Filled 2011-09-02: qty 2
  Filled 2011-09-02 (×2): qty 1

## 2011-09-02 MED ORDER — MEDROXYPROGESTERONE ACETATE 150 MG/ML IM SUSP: 150.0000 mg | INTRAMUSCULAR | Status: DC | PRN

## 2011-09-02 MED ORDER — EPHEDRINE 5 MG/ML INJ
10.0000 mg | INTRAVENOUS | Status: DC | PRN
Start: 2011-09-02 — End: 2011-09-02

## 2011-09-02 MED ORDER — TETANUS-DIPHTH-ACELL PERTUSSIS 5-2.5-18.5 LF-MCG/0.5 IM SUSP: 0.5000 mL | Freq: Once | INTRAMUSCULAR | Status: DC

## 2011-09-02 MED ORDER — DIPHENHYDRAMINE HCL 25 MG PO CAPS: 25.0000 mg | ORAL_CAPSULE | Freq: Four times a day (QID) | ORAL | Status: DC | PRN

## 2011-09-02 MED ORDER — MEASLES, MUMPS & RUBELLA VAC ~~LOC~~ INJ
0.5000 mL | INJECTION | Freq: Once | SUBCUTANEOUS | Status: DC
  Filled 2011-09-02: qty 0.5

## 2011-09-02 MED ORDER — EPHEDRINE 5 MG/ML INJ
10.0000 mg | INTRAVENOUS | Status: DC | PRN
  Administered 2011-09-02: 10 mg via INTRAVENOUS
  Filled 2011-09-02 (×2): qty 4

## 2011-09-02 MED ORDER — PHENYLEPHRINE 40 MCG/ML (10ML) SYRINGE FOR IV PUSH (FOR BLOOD PRESSURE SUPPORT)
80.0000 ug | PREFILLED_SYRINGE | INTRAVENOUS | Status: DC | PRN
  Filled 2011-09-02: qty 5

## 2011-09-02 MED ORDER — DIPHENHYDRAMINE HCL 25 MG PO TABS
25.0000 mg | ORAL_TABLET | Freq: Every evening | ORAL | Status: DC | PRN
  Filled 2011-09-02: qty 1

## 2011-09-02 MED ORDER — BENZOCAINE-MENTHOL 20-0.5 % EX AERO: 1.0000 "application " | INHALATION_SPRAY | CUTANEOUS | Status: DC | PRN

## 2011-09-02 MED ORDER — SIMETHICONE 80 MG PO CHEW: 80.0000 mg | CHEWABLE_TABLET | ORAL | Status: DC | PRN

## 2011-09-02 NOTE — Progress Notes (Signed)
SVD of vigerous female infant w/ apgars of 9,9.  Placenta delivered spontaneous w/ 3VC.   No lacerations  Fundus firm.  EBL 400 .

## 2011-09-02 NOTE — Anesthesia Preprocedure Evaluation (Signed)
Anesthesia Evaluation  Patient identified by MRN, date of birth, ID band Patient awake    Reviewed: Allergy & Precautions, H&P , NPO status , Patient's Chart, lab work & pertinent test results, reviewed documented beta blocker date and time   History of Anesthesia Complications Negative for: history of anesthetic complications (painful epidural placement with last pregnancy)  Airway Mallampati: I TM Distance: >3 FB Neck ROM: full    Dental  (+) Teeth Intact   Pulmonary asthma (last inhaler use 2-3 days ago, no hospitalizations) ,  clear to auscultation        Cardiovascular neg cardio ROS regular Normal    Neuro/Psych Seizures - (last in Feb 2012 - on lamictal), Well Controlled,  Negative Neurological ROS  Negative Psych ROS   GI/Hepatic GERD-  Medicated,Gallstones   Endo/Other  Negative Endocrine ROS  Renal/GU Renal disease (medullary sponge kidney)  Genitourinary negative   Musculoskeletal   Abdominal   Peds  Hematology negative hematology ROS (+)   Anesthesia Other Findings   Reproductive/Obstetrics (+) Pregnancy                           Anesthesia Physical Anesthesia Plan  ASA: II  Anesthesia Plan: Epidural   Post-op Pain Management:    Induction:   Airway Management Planned:   Additional Equipment:   Intra-op Plan:   Post-operative Plan:   Informed Consent: I have reviewed the patients History and Physical, chart, labs and discussed the procedure including the risks, benefits and alternatives for the proposed anesthesia with the patient or authorized representative who has indicated his/her understanding and acceptance.     Plan Discussed with:   Anesthesia Plan Comments:         Anesthesia Quick Evaluation

## 2011-09-02 NOTE — Anesthesia Procedure Notes (Signed)
Epidural Patient location during procedure: OB Start time: 09/02/2011 1:24 AM Reason for block: procedure for pain  Staffing Performed by: anesthesiologist   Preanesthetic Checklist Completed: patient identified, site marked, surgical consent, pre-op evaluation, timeout performed, IV checked, risks and benefits discussed and monitors and equipment checked  Epidural Patient position: sitting Prep: site prepped and draped and DuraPrep Patient monitoring: continuous pulse ox and blood pressure Approach: midline Injection technique: LOR air  Needle:  Needle type: Tuohy  Needle gauge: 17 G Needle length: 9 cm Needle insertion depth: 7 cm Catheter type: closed end flexible Catheter size: 19 Gauge Catheter at skin depth: 12 cm Test dose: negative  Assessment Events: blood not aspirated, injection not painful, no injection resistance, negative IV test and no paresthesia  Additional Notes Discussed risk of headache, infection, bleeding, nerve injury and failed or incomplete block.  Patient voices understanding and wishes to proceed.

## 2011-09-02 NOTE — Progress Notes (Signed)
Pt progressed to c/c/+1 station.  C/o nausea.  Pushing w/ good effort  FHT reassuring w/ accels Toco  Q3   A/P:  Exp mngt

## 2011-09-02 NOTE — Anesthesia Postprocedure Evaluation (Signed)
  Anesthesia Post-op Note  Patient: [name]  Procedure(s) Performed: * No procedures listed *  Patient Location: PACU and Mother/Baby  Anesthesia Type: Epidural  Level of Consciousness: awake, alert  and oriented  Airway and Oxygen Therapy: Patient Spontanous Breathing   Post-op Assessment: Patient's Cardiovascular Status Stable and Respiratory Function Stable  Post-op Vital Signs: stable  Complications: No apparent anesthesia complications

## 2011-09-03 LAB — CBC
MCH: 25.4 pg — ABNORMAL LOW (ref 26.0–34.0)
MCHC: 31.3 g/dL (ref 30.0–36.0)
MCV: 81.1 fL (ref 78.0–100.0)
Platelets: 239 10*3/uL (ref 150–400)

## 2011-09-03 NOTE — Progress Notes (Signed)
Post Partum Day 1 Subjective: no complaints, up ad lib and tolerating PO  Objective: Blood pressure 93/63, pulse 67, temperature 97.7 F (36.5 C), temperature source Oral, resp. rate 20, height 5' (1.524 m), weight 85.276 kg (188 lb), last menstrual period 12/11/2010, unknown if currently breastfeeding.  Physical Exam:  General: alert and cooperative Lochia: appropriate Uterine Fundus: firm Incision: n/a DVT Evaluation: No evidence of DVT seen on physical exam.   Basename 09/03/11 0507 09/01/11 1900  HGB 9.0* 9.4*  HCT 28.8* 30.3*    Assessment/Plan: Circumcision prior to discharge - risks d/w mom and informed consent obtained.  Continue PP care   LOS: 2 days   [name] 09/03/2011, 9:13 AM

## 2011-09-04 MED ORDER — OXYCODONE-ACETAMINOPHEN 5-325 MG PO TABS: 1.0000 | ORAL_TABLET | ORAL | Status: AC | PRN

## 2011-09-04 NOTE — Discharge Summary (Signed)
Obstetric Discharge Summary Reason for Admission: rupture of membranes Prenatal Procedures: ultrasound Intrapartum Procedures: spontaneous vaginal delivery Postpartum Procedures: none Complications-Operative and Postpartum: none Hemoglobin  Date Value Range Status  09/03/2011 9.0* 12.0-15.0 (Barker/dL) Final     HCT  Date Value Range Status  09/03/2011 28.8* 36.0-46.0 (%) Final    Discharge Diagnoses: Term Pregnancy-delivered  Discharge Information: Date: 09/04/2011 Activity: pelvic rest Diet: routine Medications: PNV, Percocet and lamictal Condition: stable Instructions: refer to practice specific booklet Discharge to: home   Newborn Data: Live born female  Birth Weight: 6 lb 12 oz ([id number] Barker) APGAR: 8, 9  Home with mother.  [name] 09/04/2011, 8:15 AM

## 2011-09-04 NOTE — Progress Notes (Signed)
Post Partum Day 2 Subjective: no complaints, up ad lib, voiding and + flatus  Objective: Blood pressure 116/78, pulse 75, temperature 97.7 F (36.5 C), temperature source Oral, resp. rate 18, height 5' (1.524 m), weight 85.276 kg (188 lb), last menstrual period 12/11/2010, unknown if currently breastfeeding.  Physical Exam:  General: alert and cooperative Lochia: appropriate Uterine Fundus: firm Perineum intact DVT Evaluation: No evidence of DVT seen on physical exam.   Basename 09/03/11 0507 09/01/11 1900  HGB 9.0* 9.4*  HCT 28.8* 30.3*    Assessment/Plan: Discharge home   LOS: 3 days   Laroy Mustard G 09/04/2011, 8:08 AM

## 2011-10-16 ENCOUNTER — Encounter: Payer: Self-pay | Admitting: Internal Medicine

## 2011-10-16 ENCOUNTER — Ambulatory Visit (INDEPENDENT_AMBULATORY_CARE_PROVIDER_SITE_OTHER): Payer: 59 | Admitting: Internal Medicine

## 2011-10-16 DIAGNOSIS — R05 Cough: Secondary | ICD-10-CM

## 2011-10-16 MED ORDER — BENZONATATE 100 MG PO CAPS: 100.0000 mg | ORAL_CAPSULE | Freq: Three times a day (TID) | ORAL | Status: DC | PRN

## 2011-10-16 MED ORDER — PREDNISONE 10 MG PO TABS: 10.0000 mg | ORAL_TABLET | Freq: Every day | ORAL | Status: DC

## 2011-10-16 NOTE — Patient Instructions (Signed)
Cough sounds like it is allergic in nature, especially since it is triggered by various environmental allergens - scents, etc. Plan - prednisone burst and taper, tessalon perles and robitussin DM 1 tsp every 6 hours. This is to overcome the irritation of the trachea that can perpetuate the cough. For the underlying problem of possible allergies - take over the counter generic claritin 10 mg once a day. If this doesn't work you will need an allergy evaluation.    Allergic Rhinitis Allergic rhinitis is when the mucous membranes in the nose respond to allergens. Allergens are particles in the air that cause your body to have an allergic reaction. This causes you to release allergic antibodies. Through a chain of events, these eventually cause you to release histamine into the blood stream (hence the use of antihistamines). Although meant to be protective to the body, it is this release that causes your discomfort, such as frequent sneezing, congestion and an itchy runny nose.   CAUSES   The pollen allergens may come from grasses, trees, and weeds. This is seasonal allergic rhinitis, or "hay fever." Other allergens cause year-round allergic rhinitis (perennial allergic rhinitis) such as house dust mite allergen, pet dander and mold spores.   SYMPTOMS    Nasal stuffiness (congestion).     Runny, itchy nose with sneezing and tearing of the eyes.     There is often an itching of the mouth, eyes and ears.  It cannot be cured, but it can be controlled with medications. DIAGNOSIS   If you are unable to determine the offending allergen, skin or blood testing may find it. TREATMENT    Avoid the allergen.     Medications and allergy shots (immunotherapy) can help.     Hay fever may often be treated with antihistamines in pill or nasal spray forms. Antihistamines block the effects of histamine. There are over-the-counter medicines that may help with nasal congestion and swelling around the eyes. Check with  your caregiver before taking or giving this medicine.  If the treatment above does not work, there are many new medications your caregiver can prescribe. Stronger medications may be used if initial measures are ineffective. Desensitizing injections can be used if medications and avoidance fails. Desensitization is when a patient is given ongoing shots until the body becomes less sensitive to the allergen. Make sure you follow up with your caregiver if problems continue. SEEK MEDICAL CARE IF:    You develop fever (more than 100.5 F (38.1 C).     You develop a cough that does not stop easily (persistent).     You have shortness of breath.     You start wheezing.     Symptoms interfere with normal daily activities.  Document Released: 06/06/2001 Document Revised: 05/24/2011 Document Reviewed: 12/16/2008 [employer] [facility] Patient Information 2012 [place], [place].

## 2011-10-16 NOTE — Progress Notes (Signed)
  Subjective:    Patient ID: [name], female    DOB: May 31, 1986, 26 y.o.   MRN: [id number]  HPI Ms. Chelsea Primus presents for chronic cough. This has been going on for greater than a year. 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 She has had PFTs which by pt report (cannot find in EPIC) was normal. She has tried treatment for reflux with no improvement and she has tried albuterol with no improvement. She has various triggers: soaps, scents, deodorant.  In the interval has had a NSVD baby boy on December 8th, '12  Past Medical History  Diagnosis Date  . Medullary sponge kidney   . Absence of menstruation 10/27/2009  . ASTHMA NOS W/ACUTE EXACERBATION 01/05/2010  . MIGRAINES, HX OF 06/24/2007  . PYELONEPHRITIS 06/24/2007  . Seizures   . Gallstones    Past Surgical History  Procedure Date  . Back surgery    Family History  Problem Relation Age of Onset  . Anesthesia problems Neg Hx   . Heart disease Mother    History   Social History  . Marital Status: Single    Spouse Name: N/A    Number of Children: N/A  . Years of Education: N/A   Occupational History  . Not on file.   Social History Main Topics  . Smoking status: Never Smoker   . Smokeless tobacco: Never Used  . Alcohol Use: No  . Drug Use: No  . Sexually Active: Yes    Birth Control/ Protection: None   Other Topics Concern  . Not on file   Social History Narrative  . No narrative on file       Review of Systems System review is negative for any constitutional, cardiac, pulmonary, GI or neuro symptoms or complaints other than as described in the HPI.     Objective:   Physical Exam Filed Vitals:   10/16/11 0958  BP: 118/60  Pulse: 72  Temp: 97.8 F (36.6 C)  Resp: 16   Gen'l - overweight white woman in no distress HEENT- C&S clear PUlm - dry cough noted, no increased WOB, lungs - CTAP w/o wheeze Cor - RRR Neuro A&O x 3       Assessment & Plan:

## 2011-10-17 DIAGNOSIS — R053 Chronic cough: Secondary | ICD-10-CM | POA: Insufficient documentation

## 2011-10-17 DIAGNOSIS — R05 Cough: Secondary | ICD-10-CM | POA: Insufficient documentation

## 2011-10-17 NOTE — Assessment & Plan Note (Signed)
Persistent chronic cough w/o evidence of infection or of asthma flare. Albuterol has not helped and treatment for reflux has not helped. Suspect allergic rhinitis with cyclical cough.  Plan - prednisone burst and taper; tessalon perles; robitussin DM           Daily use of non-sedating antihistamine, e.g. claritin.

## 2012-07-17 NOTE — H&P (Signed)
[name] is an 27 y.o. female.   Chief Complaint: back pain  HPI: The patient is a 26 year old female who presents with back pain. The patient reports low back symptoms including low back pain which began years ago without any known injury. and Symptoms include pain and stiffness after sitting. The pain radiates to the right buttock. The patient describes the pain as aching. Dr. Darrelyn Barker operated on her and did a hemilaminectomy and microdiscectomy at L5-S1 on the left in 2009, four years ago. The last time he saw her had her leg pain dissipated. She has never had right leg symptoms until the last several weeks. The last time he saw her was March 2012 which was a year and half ago and she had a normal neurological exam. She came in June 04, 2012 saying she has been having pain down her right leg now into the right thigh and she also has back pain. MRI revealed herniated L5-S1 disc on the right.   Past Medical History  Diagnosis Date  . Medullary sponge kidney   . Absence of menstruation 10/27/2009  . ASTHMA NOS W/ACUTE EXACERBATION 01/05/2010  . MIGRAINES, HX OF 06/24/2007  . PYELONEPHRITIS 06/24/2007  . Seizures   . Gallstones     Past Surgical History  Procedure Date  . Back surgery     Family History  Problem Relation Age of Onset  . Anesthesia problems Neg Hx   . Heart disease Mother    Social History:  reports that she has never smoked. She has never used smokeless tobacco. She reports that she does not drink alcohol or use illicit drugs.  Allergies:  Allergies  Allergen Reactions  . Other Anaphylaxis    balsalmic vingerette  . Ciprofloxacin Other (See Comments)    Patient stated that her arm started burning really intensely.  . Imitrex (Sumatriptan Base)     Unknown reaction  . Nsaids Other (See Comments)    Patient states that she cant remember what her reaction to the medication was.  . Topiramate Other (See Comments)    Reaction unknown  . Zolmitriptan Other  (See Comments)    Reaction unknown     Current Outpatient Prescriptions on File Prior to Encounter  Medication Sig Dispense Refill  . albuterol (PROVENTIL HFA;VENTOLIN HFA) 108 (90 BASE) MCG/ACT inhaler Inhale 2 puffs into the lungs every 6 (six) hours as needed. Shortness of breath       . benzonatate (TESSALON PERLES) 100 MG capsule Take 1 capsule (100 mg total) by mouth 3 (three) times daily as needed for cough.  30 capsule  0  . LamoTRIgine (LAMICTAL XR) 25 MG TB24 tablet Take 25 mg by mouth at bedtime.        . lamoTRIgine (LAMICTAL) 100 MG tablet Take 100 mg by mouth 2 (two) times daily.      [name] Estradiol-Fe (GENERESS FE PO) Take by mouth daily.      . predniSONE (DELTASONE) 10 MG tablet Take 1 tablet (10 mg total) by mouth daily. 3 tabs daily for 3 days; 2 tabs daily for 3 days; 1 tab daily for 6 days  21 tablet  0   Review of Systems  Constitutional: Positive for malaise/fatigue. Negative for fever, chills, weight loss and diaphoresis.  HENT: Negative.  Negative for neck pain.   Respiratory: Negative.   Cardiovascular: Negative.   Gastrointestinal: Negative.   Genitourinary: Negative.   Musculoskeletal: Positive for back pain and joint pain. Negative for myalgias  and falls.  Skin: Negative.   Neurological: Negative.  Negative for weakness.  Endo/Heme/Allergies: Negative.   Psychiatric/Behavioral: Negative for depression, suicidal ideas, hallucinations, memory loss and substance abuse. The patient is nervous/anxious. The patient does not have insomnia.     Physical Exam  Constitutional: She is oriented to person, place, and time. She appears well-developed and well-nourished. No distress.  HENT:  Head: Normocephalic and atraumatic.  Right Ear: External ear normal.  Left Ear: External ear normal.  Nose: Nose normal.  Mouth/Throat: Oropharynx is clear and moist.  Eyes: Conjunctivae normal and EOM are normal.  Neck: Normal range of motion. Neck supple. No  thyromegaly present.  Cardiovascular: Normal rate, regular rhythm and normal heart sounds.   No murmur heard. Respiratory: Effort normal. No respiratory distress. She has no wheezes. She exhibits no tenderness.  GI: Soft. Bowel sounds are normal. She exhibits no distension and no mass. There is no tenderness.  Musculoskeletal:       Right hip: Normal.       Left hip: Normal.       Right knee: Normal.       Left knee: Normal.       Lumbar back: She exhibits decreased range of motion and pain.       Back:  Lymphadenopathy:    She has no cervical adenopathy.  Neurological: She is alert and oriented to person, place, and time. A sensory deficit is present. No cranial nerve deficit.       Marked weakness of the dorsiflexors and toe extensors on the right. Sensory deficit along the S1 nerve foot on the right    Skin: She is not diaphoretic. No erythema.  Psychiatric: She has a normal mood and affect.     Assessment/Plan She needs a hemilaminectomy and microdiscectomy at L5-S1 on the right. She will be placed on observation overnight for this. Risks and benefits of the surgery were discussed with the patient by Dr. Darrelyn Barker.  [name] 07/17/2012, 3:22 PM

## 2012-07-23 ENCOUNTER — Encounter (HOSPITAL_COMMUNITY)
Admission: RE | Admit: 2012-07-23 | Discharge: 2012-07-23 | Disposition: A | Discharge status: Home / Self Care | Payer: 59 | Source: Ambulatory Visit | Attending: Orthopedic Surgery | Admitting: Orthopedic Surgery

## 2012-07-23 ENCOUNTER — Ambulatory Visit (HOSPITAL_COMMUNITY)
Admission: RE | Admit: 2012-07-23 | Discharge: 2012-07-23 | Disposition: A | Discharge status: Home / Self Care | Payer: 59 | Source: Ambulatory Visit | Attending: Surgical | Admitting: Surgical

## 2012-07-23 ENCOUNTER — Encounter (HOSPITAL_COMMUNITY): Payer: Self-pay

## 2012-07-23 ENCOUNTER — Encounter (HOSPITAL_COMMUNITY): Payer: Self-pay | Admitting: Pharmacy Technician

## 2012-07-23 DIAGNOSIS — M5137 Other intervertebral disc degeneration, lumbosacral region: Secondary | ICD-10-CM | POA: Insufficient documentation

## 2012-07-23 DIAGNOSIS — Z01812 Encounter for preprocedural laboratory examination: Secondary | ICD-10-CM | POA: Insufficient documentation

## 2012-07-23 DIAGNOSIS — M51379 Other intervertebral disc degeneration, lumbosacral region without mention of lumbar back pain or lower extremity pain: Secondary | ICD-10-CM | POA: Insufficient documentation

## 2012-07-23 DIAGNOSIS — Z01818 Encounter for other preprocedural examination: Secondary | ICD-10-CM | POA: Insufficient documentation

## 2012-07-23 LAB — COMPREHENSIVE METABOLIC PANEL
ALT: 13 U/L (ref 0–35)
AST: 15 U/L (ref 0–37)
Albumin: 3.7 g/dL (ref 3.5–5.2)
Alkaline Phosphatase: 74 U/L (ref 39–117)
BUN: 8 mg/dL (ref 6–23)
CO2: 26 mEq/L (ref 19–32)
Calcium: 9.8 mg/dL (ref 8.4–10.5)
Chloride: 103 mEq/L (ref 96–112)
Creatinine, Ser: 0.69 mg/dL (ref 0.50–1.10)
GFR calc Af Amer: >90 mL/min (ref >90)
GFR calc non Af Amer: >90 mL/min (ref >90)
Glucose, Bld: 100 mg/dL — ABNORMAL HIGH (ref 70–99)
Potassium: 4.7 mEq/L (ref 3.5–5.1)
Sodium: 138 mEq/L (ref 135–145)
Total Bilirubin: 0.2 mg/dL — ABNORMAL LOW (ref 0.3–1.2)
Total Protein: 7.9 g/dL (ref 6.0–8.3)

## 2012-07-23 LAB — URINALYSIS, ROUTINE W REFLEX MICROSCOPIC
Bilirubin Urine: NEGATIVE
Glucose, UA: NEGATIVE mg/dL
Hgb urine dipstick: NEGATIVE
Ketones, ur: NEGATIVE mg/dL
Leukocytes, UA: NEGATIVE
Nitrite: NEGATIVE
Protein, ur: NEGATIVE mg/dL
Specific Gravity, Urine: 1.025 (ref 1.005–1.030)
Urobilinogen, UA: 0.2 mg/dL (ref 0.0–1.0)
pH: 7.5 (ref 5.0–8.0)

## 2012-07-23 LAB — SURGICAL PCR SCREEN
MRSA, PCR: NEGATIVE
Staphylococcus aureus: NEGATIVE

## 2012-07-23 LAB — HCG, SERUM, QUALITATIVE: Preg, Serum: NEGATIVE

## 2012-07-23 LAB — PROTIME-INR
INR: 0.93 (ref 0.00–1.49)
Prothrombin Time: 12.4 seconds (ref 11.6–15.2)

## 2012-07-23 LAB — CBC
HCT: 40.3 % (ref 36.0–46.0)
MCHC: 33.3 g/dL (ref 30.0–36.0)
Platelets: 312 10*3/uL (ref 150–400)
RDW: 12 % (ref 11.5–15.5)
WBC: 5.5 10*3/uL (ref 4.0–10.5)

## 2012-07-23 LAB — APTT: aPTT: 29 seconds (ref 24–37)

## 2012-07-23 NOTE — Pre-Procedure Instructions (Signed)
20 [name]  07/23/2012   Your procedure is scheduled on:  Wednesday 07-25-3011  Report to [facility] at  AM.  0530  Call this number if you have problems the morning of surgery: [phone]   Remember:   Do not drink liquids or  eat food:After Midnight. Tuesday night      Take these medicines the morning of surgery with A SIP OF WATER:  Lamictal XR   Do not wear jewelry, make-up or nail polish.  Do not wear lotions, powders, or perfumes. You may wear deodorant.  Do not shave 48 hours prior to surgery. Men may shave face and neck.  Do not bring valuables to the hospital.  Contacts, dentures or bridgework may not be worn into surgery.  Leave suitcase in the car. After surgery it may be brought to your room.  For patients admitted to the hospital, checkout time is 11:00 AM the day of discharge.   Patients discharged the day of surgery will not be allowed to drive home.  Name and phone number of your driver:  Father Adolphus Birchwood [phone]  See [facility] Preparing for surgery sheet.   Please read over the following fact sheets that you were given: MRSA Information,Incentive spirometry

## 2012-07-24 ENCOUNTER — Encounter (HOSPITAL_COMMUNITY): Payer: Self-pay | Admitting: Anesthesiology

## 2012-07-24 ENCOUNTER — Observation Stay (HOSPITAL_COMMUNITY)
Admission: RE | Admit: 2012-07-24 | Discharge: 2012-07-25 | Disposition: A | Discharge status: Home / Self Care | Payer: 59 | Source: Ambulatory Visit | Attending: Orthopedic Surgery | Admitting: Orthopedic Surgery

## 2012-07-24 ENCOUNTER — Ambulatory Visit (HOSPITAL_COMMUNITY): Payer: 59

## 2012-07-24 ENCOUNTER — Ambulatory Visit (HOSPITAL_COMMUNITY): Payer: 59 | Admitting: Anesthesiology

## 2012-07-24 ENCOUNTER — Encounter (HOSPITAL_COMMUNITY): Payer: Self-pay | Admitting: *Deleted

## 2012-07-24 ENCOUNTER — Encounter (HOSPITAL_COMMUNITY)
Admission: RE | Disposition: A | Discharge status: Home / Self Care | Payer: Self-pay | Source: Ambulatory Visit | Attending: Orthopedic Surgery

## 2012-07-24 DIAGNOSIS — M48062 Spinal stenosis, lumbar region with neurogenic claudication: Secondary | ICD-10-CM | POA: Diagnosis present

## 2012-07-24 DIAGNOSIS — Z79899 Other long term (current) drug therapy: Secondary | ICD-10-CM | POA: Insufficient documentation

## 2012-07-24 DIAGNOSIS — Z9889 Other specified postprocedural states: Secondary | ICD-10-CM

## 2012-07-24 DIAGNOSIS — M48061 Spinal stenosis, lumbar region without neurogenic claudication: Secondary | ICD-10-CM | POA: Insufficient documentation

## 2012-07-24 DIAGNOSIS — Q615 Medullary cystic kidney: Secondary | ICD-10-CM | POA: Insufficient documentation

## 2012-07-24 DIAGNOSIS — Z01812 Encounter for preprocedural laboratory examination: Secondary | ICD-10-CM | POA: Insufficient documentation

## 2012-07-24 DIAGNOSIS — M5126 Other intervertebral disc displacement, lumbar region: Principal | ICD-10-CM | POA: Insufficient documentation

## 2012-07-24 DIAGNOSIS — J45909 Unspecified asthma, uncomplicated: Secondary | ICD-10-CM | POA: Insufficient documentation

## 2012-07-24 HISTORY — PX: LUMBAR LAMINECTOMY/DECOMPRESSION MICRODISCECTOMY: SHX5026

## 2012-07-24 SURGERY — LUMBAR LAMINECTOMY/DECOMPRESSION MICRODISCECTOMY
Anesthesia: General | Site: Back | Laterality: Right | Wound class: Clean

## 2012-07-24 MED ORDER — CEFAZOLIN SODIUM-DEXTROSE 2-3 GM-% IV SOLR
2.0000 g | INTRAVENOUS | Status: AC
  Administered 2012-07-24: 2 g via INTRAVENOUS

## 2012-07-24 MED ORDER — HYDROMORPHONE HCL PF 1 MG/ML IJ SOLN
INTRAMUSCULAR | Status: AC
  Filled 2012-07-24: qty 1

## 2012-07-24 MED ORDER — FERROUS GLUCONATE 324 (38 FE) MG PO TABS
324.0000 mg | ORAL_TABLET | Freq: Every day | ORAL | Status: DC
  Filled 2012-07-24 (×2): qty 1

## 2012-07-24 MED ORDER — METHOCARBAMOL 500 MG PO TABS: 500.0000 mg | ORAL_TABLET | Freq: Four times a day (QID) | ORAL | Status: DC | PRN

## 2012-07-24 MED ORDER — FERROUS GLUCONATE 324 (38 FE) MG PO TABS
325.0000 mg | ORAL_TABLET | Freq: Every day | ORAL | Status: DC
  Filled 2012-07-24: qty 1

## 2012-07-24 MED ORDER — HYDROMORPHONE HCL PF 1 MG/ML IJ SOLN: 0.5000 mg | INTRAMUSCULAR | Status: DC | PRN

## 2012-07-24 MED ORDER — CISATRACURIUM BESYLATE (PF) 10 MG/5ML IV SOLN
INTRAVENOUS | Status: DC | PRN
  Administered 2012-07-24: 6 mg via INTRAVENOUS

## 2012-07-24 MED ORDER — ACETAMINOPHEN 325 MG PO TABS: 650.0000 mg | ORAL_TABLET | ORAL | Status: DC | PRN

## 2012-07-24 MED ORDER — BUPIVACAINE LIPOSOME 1.3 % IJ SUSP
INTRAMUSCULAR | Status: DC | PRN
  Administered 2012-07-24: 20 mL

## 2012-07-24 MED ORDER — SODIUM CHLORIDE 0.9 % IR SOLN
Status: DC | PRN
  Administered 2012-07-24: 08:00:00

## 2012-07-24 MED ORDER — BACITRACIN-NEOMYCIN-POLYMYXIN 400-5-5000 EX OINT
TOPICAL_OINTMENT | CUTANEOUS | Status: AC
  Filled 2012-07-24: qty 1

## 2012-07-24 MED ORDER — PROPOFOL 10 MG/ML IV BOLUS
INTRAVENOUS | Status: DC | PRN
  Administered 2012-07-24: 170 mg via INTRAVENOUS

## 2012-07-24 MED ORDER — ACETAMINOPHEN 10 MG/ML IV SOLN
INTRAVENOUS | Status: AC
  Filled 2012-07-24: qty 100

## 2012-07-24 MED ORDER — HYDROMORPHONE HCL PF 1 MG/ML IJ SOLN
0.2500 mg | INTRAMUSCULAR | Status: DC | PRN
  Administered 2012-07-24 (×3): 0.5 mg via INTRAVENOUS

## 2012-07-24 MED ORDER — BUPIVACAINE-EPINEPHRINE PF 0.25-1:200000 % IJ SOLN
INTRAMUSCULAR | Status: AC
  Filled 2012-07-24: qty 30

## 2012-07-24 MED ORDER — THROMBIN 5000 UNITS EX SOLR
CUTANEOUS | Status: AC
  Filled 2012-07-24: qty 10000

## 2012-07-24 MED ORDER — SUFENTANIL CITRATE 50 MCG/ML IV SOLN
INTRAVENOUS | Status: DC | PRN
  Administered 2012-07-24 (×2): 10 ug via INTRAVENOUS
  Administered 2012-07-24: 20 ug via INTRAVENOUS
  Administered 2012-07-24: 10 ug via INTRAVENOUS

## 2012-07-24 MED ORDER — PHENOL 1.4 % MT LIQD
1.0000 | OROMUCOSAL | Status: DC | PRN
  Filled 2012-07-24: qty 177

## 2012-07-24 MED ORDER — LAMOTRIGINE ER 100 MG PO TB24: 100.0000 mg | ORAL_TABLET | Freq: Two times a day (BID) | ORAL | Status: DC

## 2012-07-24 MED ORDER — MIDAZOLAM HCL 5 MG/5ML IJ SOLN
INTRAMUSCULAR | Status: DC | PRN
  Administered 2012-07-24 (×2): 1 mg via INTRAVENOUS

## 2012-07-24 MED ORDER — CEFAZOLIN SODIUM 1-5 GM-% IV SOLN
1.0000 g | Freq: Three times a day (TID) | INTRAVENOUS | Status: AC
  Administered 2012-07-24 – 2012-07-25 (×3): 1 g via INTRAVENOUS
  Filled 2012-07-24 (×3): qty 50

## 2012-07-24 MED ORDER — BUPIVACAINE LIPOSOME 1.3 % IJ SUSP
20.0000 mL | Freq: Once | INTRAMUSCULAR | Status: DC
  Filled 2012-07-24: qty 20

## 2012-07-24 MED ORDER — POLYETHYLENE GLYCOL 3350 17 G PO PACK
17.0000 g | PACK | Freq: Every day | ORAL | Status: DC | PRN
  Filled 2012-07-24: qty 1

## 2012-07-24 MED ORDER — CEFAZOLIN SODIUM-DEXTROSE 2-3 GM-% IV SOLR
INTRAVENOUS | Status: AC
  Filled 2012-07-24: qty 50

## 2012-07-24 MED ORDER — LIDOCAINE HCL (CARDIAC) 20 MG/ML IV SOLN
INTRAVENOUS | Status: DC | PRN
  Administered 2012-07-24: 100 mg via INTRAVENOUS

## 2012-07-24 MED ORDER — BACITRACIN-NEOMYCIN-POLYMYXIN OINTMENT TUBE
TOPICAL_OINTMENT | CUTANEOUS | Status: DC | PRN
  Administered 2012-07-24: 1 via TOPICAL

## 2012-07-24 MED ORDER — HYDROCODONE-ACETAMINOPHEN 5-325 MG PO TABS
1.0000 | ORAL_TABLET | ORAL | Status: DC | PRN
  Administered 2012-07-24 – 2012-07-25 (×4): 2 via ORAL
  Filled 2012-07-24 (×4): qty 2

## 2012-07-24 MED ORDER — ONDANSETRON HCL 4 MG/2ML IJ SOLN
INTRAMUSCULAR | Status: DC | PRN
  Administered 2012-07-24: 4 mg via INTRAVENOUS

## 2012-07-24 MED ORDER — BISACODYL 10 MG RE SUPP: 10.0000 mg | Freq: Every day | RECTAL | Status: DC | PRN

## 2012-07-24 MED ORDER — FLEET ENEMA 7-19 GM/118ML RE ENEM: 1.0000 | ENEMA | Freq: Once | RECTAL | Status: AC | PRN

## 2012-07-24 MED ORDER — ONDANSETRON HCL 4 MG/2ML IJ SOLN: 4.0000 mg | INTRAMUSCULAR | Status: DC | PRN

## 2012-07-24 MED ORDER — METHOCARBAMOL 100 MG/ML IJ SOLN
500.0000 mg | Freq: Four times a day (QID) | INTRAVENOUS | Status: DC | PRN
  Administered 2012-07-24: 500 mg via INTRAVENOUS
  Filled 2012-07-24: qty 5

## 2012-07-24 MED ORDER — LACTATED RINGERS IV SOLN
INTRAVENOUS | Status: DC
  Administered 2012-07-24 (×2): via INTRAVENOUS

## 2012-07-24 MED ORDER — LACTATED RINGERS IV SOLN
INTRAVENOUS | Status: DC
  Administered 2012-07-24: 21:00:00 via INTRAVENOUS

## 2012-07-24 MED ORDER — SUCCINYLCHOLINE CHLORIDE 20 MG/ML IJ SOLN
INTRAMUSCULAR | Status: DC | PRN
  Administered 2012-07-24: 80 mg via INTRAVENOUS

## 2012-07-24 MED ORDER — THROMBIN 5000 UNITS EX SOLR
OROMUCOSAL | Status: DC | PRN
  Administered 2012-07-24: 08:00:00 via TOPICAL

## 2012-07-24 MED ORDER — ACETAMINOPHEN 650 MG RE SUPP: 650.0000 mg | RECTAL | Status: DC | PRN

## 2012-07-24 MED ORDER — DEXAMETHASONE SODIUM PHOSPHATE 10 MG/ML IJ SOLN
INTRAMUSCULAR | Status: DC | PRN
  Administered 2012-07-24: 10 mg via INTRAVENOUS

## 2012-07-24 MED ORDER — LACTATED RINGERS IV SOLN: INTRAVENOUS | Status: DC

## 2012-07-24 MED ORDER — NORETHIN-ETH ESTRADIOL-FE 0.8-25 MG-MCG PO CHEW
1.0000 | CHEWABLE_TABLET | Freq: Every day | ORAL | Status: DC
Start: 2012-07-24 — End: 2012-07-24
  Filled 2012-07-24: qty 1

## 2012-07-24 MED ORDER — ACETAMINOPHEN 10 MG/ML IV SOLN
INTRAVENOUS | Status: DC | PRN
  Administered 2012-07-24: 1000 mg via INTRAVENOUS

## 2012-07-24 MED ORDER — MEPERIDINE HCL 50 MG/ML IJ SOLN: 6.2500 mg | INTRAMUSCULAR | Status: DC | PRN

## 2012-07-24 MED ORDER — MENTHOL 3 MG MT LOZG
1.0000 | LOZENGE | OROMUCOSAL | Status: DC | PRN
  Filled 2012-07-24: qty 9

## 2012-07-24 MED ORDER — OXYCODONE-ACETAMINOPHEN 5-325 MG PO TABS: 1.0000 | ORAL_TABLET | ORAL | Status: DC | PRN

## 2012-07-24 MED ORDER — PROMETHAZINE HCL 25 MG/ML IJ SOLN: 6.2500 mg | INTRAMUSCULAR | Status: DC | PRN

## 2012-07-24 MED ORDER — NORETHIN-ETH ESTRADIOL-FE 0.8-25 MG-MCG PO CHEW: 1.0000 | CHEWABLE_TABLET | Freq: Every day | ORAL | Status: DC

## 2012-07-24 SURGICAL SUPPLY — 43 items
APL SKNCLS STERI-STRIP NONHPOA (GAUZE/BANDAGES/DRESSINGS) ×1
BAG SPEC THK2 15X12 ZIP CLS (MISCELLANEOUS) ×1
BAG ZIPLOCK 12X15 (MISCELLANEOUS) ×2 IMPLANT
BENZOIN TINCTURE PRP APPL 2/3 (GAUZE/BANDAGES/DRESSINGS) ×2 IMPLANT
CLEANER TIP ELECTROSURG 2X2 (MISCELLANEOUS) ×2 IMPLANT
CLOTH BEACON ORANGE TIMEOUT ST (SAFETY) ×2 IMPLANT
CONT SPECI 4OZ STER CLIK (MISCELLANEOUS) ×2 IMPLANT
DRAPE LG THREE QUARTER DISP (DRAPES) ×2 IMPLANT
DRAPE MICROSCOPE LEICA (MISCELLANEOUS) ×2 IMPLANT
DRAPE POUCH INSTRU U-SHP 10X18 (DRAPES) ×2 IMPLANT
DRAPE SURG 17X11 SM STRL (DRAPES) ×2 IMPLANT
DRSG ADAPTIC 3X8 NADH LF (GAUZE/BANDAGES/DRESSINGS) ×2 IMPLANT
DRSG PAD ABDOMINAL 8X10 ST (GAUZE/BANDAGES/DRESSINGS) ×2 IMPLANT
DURAPREP 26ML APPLICATOR (WOUND CARE) ×2 IMPLANT
ELECT BLADE TIP CTD 4 INCH (ELECTRODE) ×2 IMPLANT
ELECT REM PT RETURN 9FT ADLT (ELECTROSURGICAL) ×2
ELECTRODE REM PT RTRN 9FT ADLT (ELECTROSURGICAL) ×1 IMPLANT
GLOVE BIOGEL PI IND STRL 8 (GLOVE) ×2 IMPLANT
GLOVE BIOGEL PI INDICATOR 8 (GLOVE) ×2
GLOVE ECLIPSE 8.0 STRL XLNG CF (GLOVE) ×4 IMPLANT
GOWN PREVENTION PLUS LG XLONG (DISPOSABLE) ×2 IMPLANT
GOWN STRL REIN XL XLG (GOWN DISPOSABLE) ×4 IMPLANT
KIT BASIN OR (CUSTOM PROCEDURE TRAY) ×2 IMPLANT
KIT POSITIONING SURG ANDREWS (MISCELLANEOUS) ×2 IMPLANT
MANIFOLD NEPTUNE II (INSTRUMENTS) ×2 IMPLANT
NEEDLE HYPO 22GX1.5 SAFETY (NEEDLE) IMPLANT
NEEDLE SPNL 18GX3.5 QUINCKE PK (NEEDLE) ×4 IMPLANT
PATTIES SURGICAL .5 X.5 (GAUZE/BANDAGES/DRESSINGS) IMPLANT
PATTIES SURGICAL .75X.75 (GAUZE/BANDAGES/DRESSINGS) IMPLANT
PATTIES SURGICAL 1X1 (DISPOSABLE) IMPLANT
SPONGE GAUZE 4X4 12PLY (GAUZE/BANDAGES/DRESSINGS) ×2 IMPLANT
SPONGE LAP 4X18 X RAY DECT (DISPOSABLE) IMPLANT
SPONGE SURGIFOAM ABS GEL 100 (HEMOSTASIS) ×2 IMPLANT
STAPLER VISISTAT 35W (STAPLE) ×2 IMPLANT
SUT VIC AB 0 CT1 27 (SUTURE) ×1
SUT VIC AB 0 CT1 27XBRD ANTBC (SUTURE) ×1 IMPLANT
SUT VIC AB 1 CT1 27 (SUTURE) ×2
SUT VIC AB 1 CT1 27XBRD ANTBC (SUTURE) ×1 IMPLANT
SYR CONTROL 10ML LL (SYRINGE) IMPLANT
TAPE CLOTH SURG 6X10 WHT LF (GAUZE/BANDAGES/DRESSINGS) ×2 IMPLANT
TOWEL OR 17X26 10 PK STRL BLUE (TOWEL DISPOSABLE) ×4 IMPLANT
TOWEL OR NON WOVEN STRL DISP B (DISPOSABLE) ×2 IMPLANT
TRAY LAMINECTOMY (CUSTOM PROCEDURE TRAY) ×2 IMPLANT

## 2012-07-24 NOTE — Anesthesia Postprocedure Evaluation (Signed)
  Anesthesia Post-op Note  Patient: [name]  Procedure(s) Performed: Procedure(s) (LRB): LUMBAR LAMINECTOMY/DECOMPRESSION MICRODISCECTOMY (Right)  Patient Location: PACU  Anesthesia Type: General  Level of Consciousness: awake and alert   Airway and Oxygen Therapy: Patient Spontanous Breathing  Post-op Pain: mild  Post-op Assessment: Post-op Vital signs reviewed, Patient's Cardiovascular Status Stable, Respiratory Function Stable, Patent Airway and No signs of Nausea or vomiting  Post-op Vital Signs: stable  Complications: No apparent anesthesia complications

## 2012-07-24 NOTE — Brief Op Note (Signed)
07/24/2012  9:24 AM  PATIENT:  [name]  26 y.o. female  PRE-OPERATIVE DIAGNOSIS:  herniated disc Lumbar five to sacral one right,with SEVERE Spinal Stenosis  POST-OPERATIVE DIAGNOSIS:  herniated disc Lumbar five to sacral one right,with Severe Spinal Stenosis  PROCEDURE:  Procedure(s) (LRB) with comments: LUMBAR LAMINECTOMY/DECOMPRESSION MICRODISCECTOMY (Right) - L5-S1,for HNP and Spinal Stenosis  SURGEON:  Surgeon(s) and Role:    * Jacki Cones, MD - Primary    * Drucilla Schmidt, MD - Assisting   ASSISTANTS: Marlowe Kays MD   ANESTHESIA:   general  EBL:  Total I/O In: 1100 [I.V.:1100] Out: 50 [Blood:50]  BLOOD ADMINISTERED:none  DRAINS: none   LOCAL MEDICATIONS USED:  BUPIVICAINE 20cc  SPECIMEN:  Source of Specimen:  L-5-S-1  DISPOSITION OF SPECIMEN:  PATHOLOGY  COUNTS:  YES  TOURNIQUET:  * No tourniquets in log *  DICTATION: .Other Dictation: Dictation Number [id number]  PLAN OF CARE: Admit for overnight observation  PATIENT DISPOSITION:  PACU - hemodynamically stable.   Delay start of Pharmacological VTE agent (>24hrs) due to surgical blood loss or risk of bleeding: yes

## 2012-07-24 NOTE — Transfer of Care (Signed)
Immediate Anesthesia Transfer of Care Note  Patient: [name]  Procedure(s) Performed: Procedure(s) (LRB) with comments: LUMBAR LAMINECTOMY/DECOMPRESSION MICRODISCECTOMY (Right) - L5-S1  Patient Location: PACU  Anesthesia Type:General  Level of Consciousness: awake and oriented  Airway & Oxygen Therapy: Patient Spontanous Breathing and Patient connected to face mask oxygen  Post-op Assessment: Report given to PACU RN and Post -op Vital signs reviewed and stable  Post vital signs: Reviewed and stable  Complications: No apparent anesthesia complications

## 2012-07-24 NOTE — Anesthesia Procedure Notes (Signed)
Procedure Name: Intubation Date/Time: 07/24/2012 7:36 AM Performed by: Leroy Libman L Patient Re-evaluated:Patient Re-evaluated prior to inductionOxygen Delivery Method: Circle system utilized Preoxygenation: Pre-oxygenation with 100% oxygen Intubation Type: IV induction Ventilation: Mask ventilation without difficulty and Oral airway inserted - appropriate to patient size Laryngoscope Size: Hyacinth Meeker and 2 Grade View: Grade I Tube type: Oral Tube size: 7.5 mm Number of attempts: 1 Airway Equipment and Method: Stylet Placement Confirmation: ETT inserted through vocal cords under direct vision,  breath sounds checked- equal and bilateral and positive ETCO2 Secured at: 21 cm Tube secured with: Tape Dental Injury: Teeth and Oropharynx as per pre-operative assessment

## 2012-07-24 NOTE — Progress Notes (Signed)
Pt complains of numbness and tingling down right leg. She states that the leg is swollen. In comparison with her left leg the right does not appear swollen and is non-pitting. Paged physician on call for Dr. Darrelyn Hillock, Dr. Jillyn Hidden. No new orders at this time. Will continue to monitor.

## 2012-07-24 NOTE — Interval H&P Note (Signed)
History and Physical Interval Note:  07/24/2012 7:29 AM  [name]  has presented today for surgery, with the diagnosis of herniated disc Lumbar five to sacral one right  The various methods of treatment have been discussed with the patient and family. After consideration of risks, benefits and other options for treatment, the patient has consented to  Procedure(s) (LRB) with comments: LUMBAR LAMINECTOMY/DECOMPRESSION MICRODISCECTOMY (Right) as a surgical intervention .  The patient's history has been reviewed, patient examined, no change in status, stable for surgery.  I have reviewed the patient's chart and labs.  Questions were answered to the patient's satisfaction.     Kahli Fitzgerald A

## 2012-07-24 NOTE — Anesthesia Preprocedure Evaluation (Addendum)
Anesthesia Evaluation  Patient identified by MRN, date of birth, ID band Patient awake    Reviewed: Allergy & Precautions, H&P , NPO status , Patient's Chart, lab work & pertinent test results, reviewed documented beta blocker date and time   History of Anesthesia Complications Negative for: history of anesthetic complications (painful epidural placement with last pregnancy)  Airway Mallampati: I TM Distance: >3 FB Neck ROM: full    Dental  (+) Teeth Intact   Pulmonary asthma (last inhaler use 2-3 days ago, no hospitalizations) ,  breath sounds clear to auscultation        Cardiovascular negative cardio ROS  Rhythm:regular Rate:Normal     Neuro/Psych Seizures - (last in Feb 2012 - on lamictal), Well Controlled,  negative neurological ROS  negative psych ROS   GI/Hepatic Medicated,Gallstones   Endo/Other  negative endocrine ROS  Renal/GU Renal disease (medullary sponge kidney)medulary sponge  negative genitourinary   Musculoskeletal   Abdominal   Peds  Hematology negative hematology ROS (+)   Anesthesia Other Findings   Reproductive/Obstetrics (+) Pregnancy                          Anesthesia Physical  Anesthesia Plan  ASA: II  Anesthesia Plan: General   Post-op Pain Management:    Induction: Intravenous  Airway Management Planned: Oral ETT  Additional Equipment:   Intra-op Plan:   Post-operative Plan:   Informed Consent: I have reviewed the patients History and Physical, chart, labs and discussed the procedure including the risks, benefits and alternatives for the proposed anesthesia with the patient or authorized representative who has indicated his/her understanding and acceptance.   Dental advisory given  Plan Discussed with:   Anesthesia Plan Comments:         Anesthesia Quick Evaluation

## 2012-07-25 ENCOUNTER — Encounter (HOSPITAL_COMMUNITY): Payer: Self-pay | Admitting: Orthopedic Surgery

## 2012-07-25 MED ORDER — METHOCARBAMOL 500 MG PO TABS: 500.0000 mg | ORAL_TABLET | Freq: Four times a day (QID) | ORAL | Status: DC | PRN

## 2012-07-25 MED ORDER — OXYCODONE-ACETAMINOPHEN 5-325 MG PO TABS: 1.0000 | ORAL_TABLET | ORAL | Status: DC | PRN

## 2012-07-25 NOTE — Progress Notes (Signed)
Subjective: Doing Well today. No leg pain when walking.   Objective: Vital signs in last 24 hours: Temp:  [97.8 F (36.6 C)-100 F (37.8 C)] 98.4 F (36.9 C) (10/31 0518) Pulse Rate:  [75-110] 75  (10/31 0518) Resp:  [16-22] 18  (10/31 0518) BP: (116-132)/(60-77) 116/67 mmHg (10/31 0518) SpO2:  [98 %-100 %] 100 % (10/31 0518) Weight:  [82.555 kg (182 lb)] 82.555 kg (182 lb) (10/30 1123)  Intake/Output from previous day: 10/30 0701 - 10/31 0700 In: 1690 [P.O.:240; I.V.:1450] Out: 1400 [Urine:1350; Blood:50] Intake/Output this shift:     Basename 07/23/12 1030  HGB 13.4    Basename 07/23/12 1030  WBC 5.5  RBC 4.70  HCT 40.3  PLT 312    Basename 07/23/12 1030  NA 138  K 4.7  CL 103  CO2 26  BUN 8  CREATININE 0.69  GLUCOSE 100*  CALCIUM 9.8    Basename 07/23/12 1030  LABPT --  INR 0.93    Dorsiflexion/Plantar flexion intact  Assessment/Plan: DC Today.   Donnalee Cellucci A 07/25/2012, 7:22 AM

## 2012-07-25 NOTE — Op Note (Addendum)
[name], [name] NO.:  [id number]  MEDICAL RECORD NO.:  [id number]  LOCATION:  1309                         FACILITY:  [facility]  PHYSICIAN:  Georges Lynch. Janelie Goltz, M.D.DATE OF BIRTH:  Jun 06, 1986  DATE OF PROCEDURE: DATE OF DISCHARGE:                              OPERATIVE REPORT   PREOPERATIVE DIAGNOSES: 1. 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 2. 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  OPERATION: 1. Decompressive lumbar laminectomy for severe lateral recess stenosis     at L5-S1 on the right. 2. Microdiskectomy at L5-S1 on the right.  PROCEDURE:  Under general anesthesia, the patient on a spinal frame, routine orthopedic prepping and draping, the low back was carried out. The appropriate time-out was carried out and I also marked the appropriate right side of the back in the holding area.  Two needles were then placed in the back for localization purposes.  X-ray was taken to verify the position.  An incision was made over the L5-S1 space and was extended proximally and distally.  I then separated the muscle from the lamina in the usual fashion.  I took another x-ray to verify the position.  We took several x-rays just to localize ourself more distal at L5-S1.  Note, she had marked shingling of the L5-S1 lamina.  She was severely compressed out laterally.  We brought the microscope in and carried out our decompressive lumbar laminectomy first.  We went far laterally, went down and did foraminotomies for the S1 root as well. The dura was scarred down probably most likely from her previous surgery on the opposite side 4 years ago.  We went out laterally to decompress the lateral recess first.  We cauterized the lateral recess veins.  We protected the  dura at all times as well as the roots.  Following that, I then identified the root, retracted the root gently to the midline and then identified the disk space.  We did have an instrument near the space to be getting before carrying out the microdiskectomy to verify position.  She had a large herniated disk there at L5-S1 central and to the right.  Cruciate incision was made in the posterior longitudinal ligament and lot of disk material just extruded under pressure.  We then utilized the nerve hook and the Epstein curettes, made multiple passes on the subligamentous space.  We did our microdiskectomy in usual fashion.  We went distally and proximally to make sure there were no other compressions.  We were able to easily pass the hockey-stick out at the L5 root and the S1 root.  We thoroughly irrigated out the area.  We had good hemostasis maintained.  We loosely applied some thrombin-soaked Gelfoam, closed the wound layers in usual fashion.  Before I closed the subcu, I injected 20 mL of Exparel.  Note, I left a small distal deep part of the wound open for  drainage purposes.  Subcu was closed with #1 Vicryl, skin with metal staples.  Sterile Neosporin dressing was applied.  She had 2 g of IV Ancef preop.  The disk was sent to the lab for specimen.          ______________________________ Georges Lynch. Darrelyn Hillock, M.D. Assistant: Illene Labrador. [place], MD    RAG/MEDQ  D:  07/24/2012  T:  07/25/2012  Job:  [id number]  cc:   Sanda Linger, MD [address] [place] [address] [place] [address]

## 2012-07-25 NOTE — Progress Notes (Signed)
PT Cancellation Note  Patient Details Name: [name] MRN: [id number] DOB: 1986/05/18   Cancelled Treatment:    Reason Eval/Treat Not Completed: Other (comment) (pt seen by OT. walking in hall, no AD.  doesnt need PT)   [name] 07/25/2012, 11:05 AM

## 2012-07-25 NOTE — Evaluation (Signed)
Occupational Therapy Evaluation Patient Details Name: [name] MRN: [id number] DOB: 06-Mar-1986 Today's Date: 07/25/2012 Time: 7829-5621 OT Time Calculation (min): 16 min  OT Assessment / Plan / Recommendation Clinical Impression  Pt doing well POD 1 s/p lumbar decomp. All education completed. Pt is independent/mod I with all ADLs and mobility.    OT Assessment  Patient does not need any further OT services    Follow Up Recommendations  No OT follow up    Barriers to Discharge      Equipment Recommendations  None recommended by OT    Recommendations for Other Services    Frequency       Precautions / Restrictions Precautions Precautions: Back Precaution Booklet Issued: Yes (comment)   Pertinent Vitals/Pain Reported 5/10 low back pain with mobility. Pt was premedicated prior to OTs arrival.    ADL  Grooming: Simulated;Independent Where Assessed - Grooming: Unsupported standing Upper Body Bathing: Independent;Simulated Where Assessed - Upper Body Bathing: Unsupported standing Lower Body Bathing: Simulated;Modified independent Where Assessed - Lower Body Bathing: Unsupported sit to stand Upper Body Dressing: Performed;Independent Where Assessed - Upper Body Dressing: Unsupported standing Lower Body Dressing: Performed;Modified independent Where Assessed - Lower Body Dressing: Unsupported standing;Unsupported sitting Toilet Transfer: Simulated;Modified independent Toilet Transfer Method: Sit to Barista: Regular height toilet Toileting - Clothing Manipulation and Hygiene: Simulated;Modified independent Where Assessed - Toileting Clothing Manipulation and Hygiene: Sit to stand from 3-in-1 or toilet Tub/Shower Transfer: Simulated;Modified independent Tub/Shower Transfer Method: Ambulating Transfers/Ambulation Related to ADLs: Pt ambulated around room independently without a device. ADL Comments: Pt was able to don pants in a standing position, 1  leg at a time without difficulty. Educated pt how to don socks and shoes by crossing one ankle over the opposite knee.     OT Diagnosis:    OT Problem List:   OT Treatment Interventions:     OT Goals    Visit Information  Last OT Received On: 07/25/12 Assistance Needed: +1    Subjective Data  Subjective: It doesnt hurt too bad. Patient Stated Goal: Eventually return to work.   Prior Functioning     Home Living Lives With: Family (spouse and 2 small children.) Available Help at Discharge: Family Type of Home: House Home Access: Stairs to enter Secretary/administrator of Steps: 3 Entrance Stairs-Rails: None Home Layout: One level Bathroom Shower/Tub: Engineer, manufacturing systems: Standard Prior Function Level of Independence: Independent Able to Take Stairs?: Yes Driving: Yes Vocation: Full time employment Communication Communication: No difficulties Dominant Hand: Right         Vision/Perception     Cognition  Overall Cognitive Status: Appears within functional limits for tasks assessed/performed Arousal/Alertness: Awake/alert Orientation Level: Appears intact for tasks assessed Behavior During Session: [facility] for tasks performed    Extremity/Trunk Assessment Right Upper Extremity Assessment RUE ROM/Strength/Tone: [facility] for tasks assessed Left Upper Extremity Assessment LUE ROM/Strength/Tone: WFL for tasks assessed     Mobility Bed Mobility Bed Mobility: Rolling Right;Right Sidelying to Sit;Sit to Sidelying Right Rolling Right: 6: Modified independent (Device/Increase time) Right Sidelying to Sit: 6: Modified independent (Device/Increase time);HOB flat Sit to Sidelying Right: 6: Modified independent (Device/Increase time);HOB flat Transfers Transfers: Sit to Stand;Stand to Sit Sit to Stand: 7: Independent Stand to Sit: 7: Independent     Shoulder Instructions     Exercise     Balance     End of Session OT - End of Session Activity Tolerance:  Patient tolerated treatment well Patient left: in bed;with  call bell/phone within reach  GO Functional Assessment Tool Used: Clinical Judgement Functional Limitation: Self care Self Care Current Status [phone]): At least 1 percent but less than 20 percent impaired, limited or restricted Self Care Goal Status (U0454): 0 percent impaired, limited or restricted Self Care Discharge Status [phone]): 0 percent impaired, limited or restricted   Nathanal Hermiz A OTR/L [phone] 07/25/2012, 11:01 AM

## 2012-07-29 NOTE — Discharge Summary (Signed)
Physician Discharge Summary   Patient ID: [name] MRN: [id number] DOB/AGE: 26/07/1986 26 y.o.  Admit date: 07/24/2012 Discharge date: 07/29/2012  Primary Diagnosis:  Lumbar disc herniation  Admission Diagnoses:  Past Medical History  Diagnosis Date  . Medullary sponge kidney   . Absence of menstruation 10/27/2009  . ASTHMA NOS W/ACUTE EXACERBATION 01/05/2010  . MIGRAINES, HX OF 06/24/2007  . PYELONEPHRITIS 06/24/2007  . Seizures   . Gallstones    Discharge Diagnoses:   Active Problems:  Spinal stenosis, lumbar region, with neurogenic claudication Lumbar disc herniation  Estimated Body mass index is 34.39 kg/(m^2) as calculated from the following:   Height as of this encounter: 5\' 1" (1.549 m).   Weight as of this encounter: 182 lb(82.555 kg).  Classification of overweight in adults according to BMI (WHO, 1998)   Procedure:  Procedure(s) (LRB): LUMBAR LAMINECTOMY/DECOMPRESSION MICRODISCECTOMY (Right)   Consults: None  HPI: The patient is a 26 year old female who presented with back pain. The patient reports low back symptoms including low back pain which began years ago without any known injury. and Symptoms include pain and stiffness after sitting. The pain radiates to the right buttock. The patient describes the pain as aching. Dr. Darrelyn Hillock operated on her and did a hemilaminectomy and microdiscectomy at L5-S1 on the left in 2009, four years ago. The last time he saw her had her leg pain dissipated. She has never had right leg symptoms until the last several weeks. The last time he saw her was March 2012 which was a year and half ago and she had a normal neurological exam. She came in June 04, 2012 saying she has been having pain down her right leg now into the right thigh and she also has back pain. MRI revealed herniated L5-S1 disc on the right.   Laboratory Data: Hospital Outpatient Visit on 07/23/2012  Component Date Value Range Status  . MRSA, PCR 07/23/2012  NEGATIVE  NEGATIVE Final  . Staphylococcus aureus 07/23/2012 NEGATIVE  NEGATIVE Final   Comment:                                 The Xpert SA Assay (FDA                          approved for NASAL specimens                          in patients over 67 years of age),                          is one component of                          a comprehensive surveillance                          program.  Test performance has                          been validated by [employer] for patients greater  than or equal to 56 year old.                          It is not intended                          to diagnose infection nor to                          guide or monitor treatment.  . WBC 07/23/2012 5.5  4.0 - 10.5 K/uL Final  . RBC 07/23/2012 4.70  3.87 - 5.11 MIL/uL Final  . Hemoglobin 07/23/2012 13.4  12.0 - 15.0 g/dL Final  . HCT 40/98/1191 40.3  36.0 - 46.0 % Final  . MCV 07/23/2012 85.7  78.0 - 100.0 fL Final  . MCH 07/23/2012 28.5  26.0 - 34.0 pg Final  . MCHC 07/23/2012 33.3  30.0 - 36.0 g/dL Final  . RDW 47/82/9562 12.0  11.5 - 15.5 % Final  . Platelets 07/23/2012 312  150 - 400 K/uL Final  . Preg, Serum 07/23/2012 NEGATIVE  NEGATIVE Final  . aPTT 07/23/2012 29  24 - 37 seconds Final  . Sodium 07/23/2012 138  135 - 145 mEq/L Final  . Potassium 07/23/2012 4.7  3.5 - 5.1 mEq/L Final  . Chloride 07/23/2012 103  96 - 112 mEq/L Final  . CO2 07/23/2012 26  19 - 32 mEq/L Final  . Glucose, Bld 07/23/2012 100* 70 - 99 mg/dL Final  . BUN 13/04/6577 8  6 - 23 mg/dL Final  . Creatinine, Ser 07/23/2012 0.69  0.50 - 1.10 mg/dL Final  . Calcium 46/96/2952 9.8  8.4 - 10.5 mg/dL Final  . Total Protein 07/23/2012 7.9  6.0 - 8.3 g/dL Final  . Albumin 84/13/2440 3.7  3.5 - 5.2 g/dL Final  . AST 07/22/2535 15  0 - 37 U/L Final  . ALT 07/23/2012 13  0 - 35 U/L Final  . Alkaline Phosphatase 07/23/2012 74  39 - 117 U/L Final  . Total Bilirubin 07/23/2012  0.2* 0.3 - 1.2 mg/dL Final  . GFR calc non Af Amer 07/23/2012 >90  >90 mL/min Final  . GFR calc Af Amer 07/23/2012 >90  >90 mL/min Final   Comment:                                 The eGFR has been calculated                          using the CKD EPI equation.                          This calculation has not been                          validated in all clinical                          situations.                          eGFR's persistently                          <  90 mL/min signify                          possible Chronic Kidney Disease.  Marland Kitchen Prothrombin Time 07/23/2012 12.4  11.6 - 15.2 seconds Final  . INR 07/23/2012 0.93  0.00 - 1.49 Final  . Color, Urine 07/23/2012 YELLOW  YELLOW Final  . APPearance 07/23/2012 CLOUDY* CLEAR Final  . Specific Gravity, Urine 07/23/2012 1.025  1.005 - 1.030 Final  . pH 07/23/2012 7.5  5.0 - 8.0 Final  . Glucose, UA 07/23/2012 NEGATIVE  NEGATIVE mg/dL Final  . Hgb urine dipstick 07/23/2012 NEGATIVE  NEGATIVE Final  . Bilirubin Urine 07/23/2012 NEGATIVE  NEGATIVE Final  . Ketones, ur 07/23/2012 NEGATIVE  NEGATIVE mg/dL Final  . Protein, ur 40/98/1191 NEGATIVE  NEGATIVE mg/dL Final  . Urobilinogen, UA 07/23/2012 0.2  0.0 - 1.0 mg/dL Final  . Nitrite 47/82/9562 NEGATIVE  NEGATIVE Final  . Leukocytes, UA 07/23/2012 NEGATIVE  NEGATIVE Final   MICROSCOPIC NOT DONE ON URINES WITH NEGATIVE PROTEIN, BLOOD, LEUKOCYTES, NITRITE, OR GLUCOSE <1000 mg/dL.     X-Rays:Dg Lumbar Spine 2-3 Views  07/23/2012  *RADIOLOGY REPORT*  Clinical Data: Preop for laminectomy  LUMBAR SPINE - 2-3 VIEW  Comparison: Lumbar spine films of 08/10/2008  Findings: The lumbar vertebrae remain in normal alignment.  There is diminished disc space at L5 S1 consistent with degenerative disc disease.  The remainder of intervertebral disc spaces are relatively normal, with minimal reduction of the L4-5 disc space. No compression deformity is seen.  The SI joints appear corticated.   IMPRESSION: The lumbar vertebrae are in normal alignment with degenerative disc disease at L5-S1 and to a lesser degree at L4-5.   Original Report Authenticated By: Juline Patch, M.D.    Dg Spine Portable 1 View  07/24/2012  *RADIOLOGY REPORT*  Clinical Data: Intraoperative for L5-S1 fixation.  PORTABLE SPINE - 1 VIEW  Comparison: Earlier this morning at 0814 hours.  Findings: 0830 hours.  Intraoperative lateral view localizing the lumbosacral junction.  Degenerative disc disease at this level.  IMPRESSION: Intraoperative localization of L5-S1.   Original Report Authenticated By: Consuello Bossier, M.D.    Dg Spine Portable 1 View  07/24/2012  *RADIOLOGY REPORT*  Clinical Data: Back pain.  L5-S1 discectomy planned.  PORTABLE SPINE - 1 VIEW  Comparison: Intraoperative film #1, also plain films 07/23/2012  Findings: A slightly angled probe has been inserted from a posterior approach.  This lies in the dorsal aspect of the spinal canal and is directed most closely toward the L4-5 interspace.  IMPRESSION: L4-5 marked.   Original Report Authenticated By: Elsie Stain, M.D.    Dg Spine Portable 1 View  07/24/2012  *RADIOLOGY REPORT*  Clinical Data: Back pain  PORTABLE SPINE - 1 VIEW  Comparison: Plain films 07/23/2012  Findings: Spinal needles have been placed from a posterior approach.  Both lie over the upper sacrum. There is disc space narrowing at L5-S1.  IMPRESSION: Upper sacrum localized.   Original Report Authenticated By: Elsie Stain, M.D.     Hospital Course:  [name] is a 26 y.o. who was admitted to [facility] They were brought to the operating room on 07/24/2012 and underwent Procedure(s): LUMBAR LAMINECTOMY/DECOMPRESSION MICRODISCECTOMY.  Patient tolerated the procedure well and was later transferred to the recovery room and then to the orthopaedic floor for postoperative care.  They were given PO and IV analgesics for pain control following their surgery.  They were given  24 hours of postoperative antibiotics of  Anti-infectives     Start     Dose/Rate Route Frequency Ordered Stop   07/24/12 1400   ceFAZolin (ANCEF) IVPB 1 g/50 mL premix        1 g 100 mL/hr over 30 Minutes Intravenous 3 times per day 07/24/12 1336 07/25/12 0625   07/24/12 0804   polymyxin B 500,000 Units, bacitracin 50,000 Units in sodium chloride irrigation 0.9 % 500 mL irrigation  Status:  Discontinued          As needed 07/24/12 0804 07/24/12 0919   07/24/12 0522   ceFAZolin (ANCEF) IVPB 2 g/50 mL premix        2 g 100 mL/hr over 30 Minutes Intravenous 60 min pre-op 07/24/12 0522 07/24/12 0740        PT was ordered for gait training and assistance.  Discharge planning consulted to help with postop disposition and equipment needs.  Patient had a good night on the evening of surgery and started to get up OOB with therapy on day one. Dressing was changed and the incision was clean and dry.  Patient was seen in rounds and was ready to go home.  Discharge Medications: Prior to Admission medications   Medication Sig Start Date End Date Taking? Authorizing Provider  ferrous gluconate (FERGON) 325 MG tablet Take 325 mg by mouth daily with breakfast.   Yes Historical Provider, MD  LamoTRIgine (LAMICTAL XR) 100 MG TB24 Take 100 mg by mouth 2 (two) times daily. Pt takes for seizures. Can on;y take bran lamictal XR.   Yes Historical Provider, MD  Norethindrone-Ethinyl Estradiol-Fe (GENERESS FE) 0.8-25 MG-MCG tablet Chew 1 tablet by mouth daily.   Yes Historical Provider, MD  methocarbamol (ROBAXIN) 500 MG tablet Take 1 tablet (500 mg total) by mouth every 6 (six) hours as needed. 07/25/12   Tab Rylee Tamala Ser, PA  oxyCODONE-acetaminophen (PERCOCET/ROXICET) 5-325 MG per tablet Take 1-2 tablets by mouth every 4 (four) hours as needed. 07/25/12   Marlise Fahr Tamala Ser, PA    Diet: Regular diet Activity:WBAT Follow-up:in 2 weeks Disposition - Home Discharged Condition: good   Discharge  Orders    Future Orders Please Complete By Expires   Call MD / Call 911      Comments:   If you experience chest pain or shortness of breath, CALL 911 and be transported to the hospital emergency room.  If you develope a fever above 101 F, pus (white drainage) or increased drainage or redness at the wound, or calf pain, call your surgeon's office.   Increase activity slowly as tolerated      Discharge instructions      Comments:   Change your dressing daily. Shower only, no tub bath. Call if any temperatures greater than 101 or any wound complications: [phone] during the day and ask for Dr. Jeannetta Ellis nurse, Mackey Birchwood.   Driving restrictions      Comments:   No driving for 2 weeks   Lifting restrictions      Comments:   No lifting       Medication List     As of 07/29/2012 12:20 PM    STOP taking these medications         traMADol 50 MG tablet   Commonly known as: ULTRAM      TAKE these medications         ferrous gluconate 325 MG tablet   Commonly known as: FERGON   Take  325 mg by mouth daily with breakfast.      GENERESS FE 0.8-25 MG-MCG tablet   Generic drug: Norethindrone-Ethinyl Estradiol-Fe   Chew 1 tablet by mouth daily.      LAMICTAL XR 100 MG Tb24   Generic drug: LamoTRIgine   Take 100 mg by mouth 2 (two) times daily. Pt takes for seizures. Can on;y take bran lamictal XR.      methocarbamol 500 MG tablet   Commonly known as: ROBAXIN   Take 1 tablet (500 mg total) by mouth every 6 (six) hours as needed.      oxyCODONE-acetaminophen 5-325 MG per tablet   Commonly known as: PERCOCET/ROXICET   Take 1-2 tablets by mouth every 4 (four) hours as needed.         Signed: Edd Reppert LAUREN 07/29/2012, 12:20 PM

## 2012-09-27 ENCOUNTER — Telehealth: Payer: Self-pay | Admitting: Internal Medicine

## 2012-09-27 NOTE — Telephone Encounter (Signed)
Patient Information:  Caller Name: Mame  Phone: [phone]  Patient: [name], Cygan  Gender: Female  DOB: March 25, 1986  Age: 27 Years  PCP: Illene Regulus (Adults only)  Pregnant: No  Office Follow Up:  Does the office need to follow up with this patient?: No  Instructions For The Office: N/A   Symptoms  Reason For Call & Symptoms: Patient states she has been sick for two weeks.  At present  she has body aches, sore throat, no runny nose,  Intermittent headache, +cough non productive. Afebrile. She states Tonsils are "red, irritated and swollen"  Reviewed Health History In EMR: Yes  Reviewed Medications In EMR: Yes  Reviewed Allergies In EMR: Yes  Reviewed Surgeries / Procedures: No  Date of Onset of Symptoms: 09/13/2012  Treatments Tried: mucinex  Treatments Tried Worked: No OB / GYN:  LMP: 09/13/2012  Guideline(s) Used:  Sore Throat  Disposition Per Guideline:   See Today in Office  Reason For Disposition Reached:   Pus on tonsils (back of throat) and swollen neck lymph nodes ("glands")  Advice Given:  For Relief of Sore Throat Pain:  Sip warm chicken broth or apple juice.  Suck on hard candy or a throat lozenge (over-the-counter).  Gargle warm salt water 3 times daily (1 teaspoon of salt in 8 oz or 240 ml of warm water).  Avoid cigarette smoke.  Pain Medicines:  For pain relief, you can take either acetaminophen, ibuprofen, or naproxen.  They are over-the-counter (OTC) pain drugs. You can buy them at the drugstore.  Pain Medicines:  For pain relief, you can take either acetaminophen, ibuprofen, or naproxen.  They are over-the-counter (OTC) pain drugs. You can buy them at the drugstore.  Acetaminophen (e.g., Tylenol):  Regular Strength Tylenol: Take 650 mg (two 325 mg pills) by mouth every 4-6 hours as needed. Each Regular Strength Tylenol pill has 325 mg of acetaminophen.  Soft Diet:   Cold drinks and milk shakes are especially good (Reason: swollen tonsils  can make some foods hard to swallow).  Liquids:  Adequate liquid intake is important to prevent dehydration. Drink 6-8 glasses of water per day.  Expected Course:  Sore throats with viral illnesses usually last 3 or 4 days.  Call Back If:  Sore throat is the main symptom and it lasts longer than 24 hours  Sore throat is mild but lasts longer than 4 days  Fever lasts longer than 3 days  You become worse.  Appointment Scheduled:  09/28/2012 10:00:00 Appointment Scheduled Provider:  N/A

## 2012-09-28 ENCOUNTER — Encounter: Payer: Self-pay | Admitting: Family Medicine

## 2012-09-28 ENCOUNTER — Ambulatory Visit (INDEPENDENT_AMBULATORY_CARE_PROVIDER_SITE_OTHER): Payer: 59 | Admitting: Family Medicine

## 2012-09-28 VITALS — BP 110/76 | HR 96 | Temp 98.3°F | Ht 61.0 in | Wt 180.0 lb

## 2012-09-28 DIAGNOSIS — J029 Acute pharyngitis, unspecified: Secondary | ICD-10-CM

## 2012-09-28 MED ORDER — AMOXICILLIN 875 MG PO TABS: 875.0000 mg | ORAL_TABLET | Freq: Two times a day (BID) | ORAL | Status: DC

## 2012-09-28 NOTE — Patient Instructions (Addendum)
Tonsillitis Tonsils are lumps of lymphoid tissues at the back of the throat. Each tonsil has 20 crevices (crypts). Tonsils help fight nose and throat infections and keep infection from spreading to other parts of the body for the first 18 months of life. Tonsillitis is an infection of the throat that causes the tonsils to become red, tender, and swollen. CAUSES Sudden and, if treated, temporary (acute) tonsillitis is usually caused by infection with streptococcal bacteria. Long lasting (chronic) tonsillitis occurs when the crypts of the tonsils become filled with pieces of food and bacteria, which makes it easy for the tonsils to become constantly infected. SYMPTOMS  Symptoms of tonsillitis include:  A sore throat.  White patches on the tonsils.  Fever.  Tiredness. DIAGNOSIS Tonsillitis can be diagnosed through a physical exam. Diagnosis can be confirmed with the results of lab tests, including a throat culture. TREATMENT  The goals of tonsillitis treatment include the reduction of the severity and duration of symptoms, prevention of associated conditions, and prevention of disease transmission. Tonsillitis caused by bacteria can be treated with antibiotics. Usually, treatment with antibiotics is started before the cause of the tonsillitis is known. However, if it is determined that the cause is not bacterial, antibiotics will not treat the tonsillitis. If attacks of tonsillitis are severe and frequent, your caregiver may recommend surgery to remove the tonsils (tonsillectomy). HOME CARE INSTRUCTIONS   Rest as much as possible and get plenty of sleep.  Drink plenty of fluids. While the throat is very sore, eat soft foods or liquids, such as sherbet, soups, or instant breakfast drinks.  Eat frozen ice pops.  Older children and adults may gargle with a warm or cold liquid to help soothe the throat. Mix 1 teaspoon of salt in 1 cup of water.  Other family members who also develop a sore  throat or fever should have a medical exam or throat culture.  Only take over-the-counter or prescription medicines for pain, discomfort, or fever as directed by your caregiver.  If you are given antibiotics, take them as directed. Finish them even if you start to feel better. SEEK MEDICAL CARE IF:   Your baby is older than 3 months with a rectal temperature of 100.5 F (38.1 C) or higher for more than 1 day.  Large, tender lumps develop in your neck.  A rash develops.  Green, yellow-brown, or bloody substance is coughed up.  You are unable to swallow liquids or food for 24 hours.  Your child is unable to swallow food or liquids for 12 hours. SEEK IMMEDIATE MEDICAL CARE IF:   You develop any new symptoms such as vomiting, severe headache, stiff neck, chest pain, or trouble breathing or swallowing.  You have severe throat pain along with drooling or voice changes.  You have severe pain, unrelieved with recommended medications.  You are unable to fully open the mouth.  You develop redness, swelling, or severe pain anywhere in the neck.  You have a fever.  Your baby is older than 3 months with a rectal temperature of 102 F (38.9 C) or higher.  Your baby is 3 months old or younger with a rectal temperature of 100.4 F (38 C) or higher. MAKE SURE YOU:   Understand these instructions.  Will watch your condition.  Will get help right away if you are not doing well or get worse. Document Released: 06/21/2005 Document Revised: 12/04/2011 Document Reviewed: 11/17/2010 [employer] Patient Information 2013 [employer].  

## 2012-09-28 NOTE — Progress Notes (Signed)
  Subjective:     [name] is a 27 y.o. female who presents for evaluation of sore throat. Associated symptoms include chills, enlarged tonsils, nasal blockage, pain while swallowing, post nasal drip, sinus and nasal congestion, bilateral sinus pain, sore throat and swollen glands. Onset of symptoms was 3 weeks ago, and have been gradually worsening since that time. She is drinking plenty of fluids. She has had a recent close exposure to someone with proven streptococcal pharyngitis.  The following portions of the patient's history were reviewed and updated as appropriate: allergies, current medications, past family history, past medical history, past social history, past surgical history and problem list.  Review of Systems Pertinent items are noted in HPI.    Objective:    BP 110/76  Pulse 96  Temp 98.3 F (36.8 C) (Oral)  Ht 5\' 1"  (1.549 m)  Wt 180 lb (81.647 kg)  BMI 34.01 kg/m2  SpO2 97% General appearance: alert, cooperative, appears stated age and no distress Head: Normocephalic, without obvious abnormality, atraumatic Ears: normal TM's and external ear canals both ears Nose: green discharge, mild congestion, turbinates red, swollen, sinus tenderness bilateral Throat: abnormal findings: mild oropharyngeal erythema and tonsillar hypertrophy 2+ Neck: moderate anterior cervical adenopathy, supple, symmetrical, trachea midline and thyroid not enlarged, symmetric, no tenderness/mass/nodules Lungs: clear to auscultation bilaterally  Laboratory Strep test done. Results:negative.    Assessment:    Acute pharyngitis, likely  Bacterial tonsillitis R/O strep.    Plan:    Patient placed on antibiotics. Use of OTC analgesics recommended as well as salt water gargles. Follow up as needed.

## 2012-09-28 NOTE — Addendum Note (Signed)
Addended by: Ellsworth Lennox on: 09/28/2012 12:27 PM   Modules accepted: Orders

## 2012-09-30 ENCOUNTER — Ambulatory Visit (INDEPENDENT_AMBULATORY_CARE_PROVIDER_SITE_OTHER): Payer: 59 | Admitting: Internal Medicine

## 2012-09-30 ENCOUNTER — Telehealth: Payer: Self-pay | Admitting: Internal Medicine

## 2012-09-30 ENCOUNTER — Encounter: Payer: Self-pay | Admitting: Internal Medicine

## 2012-09-30 VITALS — BP 142/90 | HR 98 | Temp 98.3°F | Resp 12 | Wt 184.0 lb

## 2012-09-30 DIAGNOSIS — Z87898 Personal history of other specified conditions: Secondary | ICD-10-CM

## 2012-09-30 DIAGNOSIS — J029 Acute pharyngitis, unspecified: Secondary | ICD-10-CM

## 2012-09-30 MED ORDER — PROMETHAZINE-CODEINE 6.25-10 MG/5ML PO SYRP: 5.0000 mL | ORAL_SOLUTION | ORAL | Status: DC | PRN

## 2012-09-30 NOTE — Telephone Encounter (Signed)
Patient Information:  Caller Name: L[place]  Phone: [phone]  Patient: [name], [name]  Gender: Female  DOB: 17-Aug-1986  Age: 27 Years  PCP: Illene Regulus (Adults only)  Pregnant: No  Office Follow Up:  Does the office need to follow up with this patient?: No  Instructions For The Office: N/A   Symptoms  Reason For Call & Symptoms: Was diagnosed with strep throat on Saturday 09/28/12. Patient reports that she is still having headache that is not relieved with Tylenol, body aches, dry cough and sore throat that is not improved. Reports lymph nodes are swollen and tender to touch at this time.  Reviewed Health History In EMR: Yes  Reviewed Medications In EMR: Yes  Reviewed Allergies In EMR: Yes  Reviewed Surgeries / Procedures: Yes  Date of Onset of Symptoms: 09/12/2012  Treatments Tried: Tylenol and Amoxicillin  Treatments Tried Worked: No OB / GYN:  LMP: 09/30/2012  Guideline(s) Used:  Strep Throat Test Follow-Up Call  Sore Throat  Disposition Per Guideline:   See Today in Office  Reason For Disposition Reached:   Pus on tonsils (back of throat) and swollen neck lymph nodes ("glands")  Advice Given:  Call Back If:  You become worse.  Appointment Scheduled:  09/30/2012 11:30:00 Appointment Scheduled Provider:  Illene Regulus (Adults only)

## 2012-09-30 NOTE — Patient Instructions (Addendum)
Large tonsils but there is no pus. Rapid strep screen was negative. You have only been on the amoxicillin since Saturday- too soon to call it a failure.  Plan -  Finish the amoxicillin  For cough try robitussin DM or the equivalent every 4 hours during the day.  For night time use take phenergan with codeine 1 or 2 tsp.  For aches and fever feeling take tylenol 500 mg, 2 tablets three times a day on schedule  Hydrate  For headache -  Try taking Generic Aleve 1 or 2 tablets every 12 hours. Over the short term, several days, this will not affect your kidneys.

## 2012-10-01 NOTE — Assessment & Plan Note (Signed)
C/o 6 day headache, constant, global w/o focal neurologic symptoms, N/V. She is intolerant of usual migraine HA and has not had relief with APAP  Plan Aleve 220-440 mg bid. She is reassured that short term use will not affect her kidneys.

## 2012-10-01 NOTE — Progress Notes (Signed)
  Subjective:    Patient ID: [name], female    DOB: September 29, 1985, 27 y.o.   MRN: [id number]  HPI Ms. Chelsea Primus was seen in Saturday clinic by Dr. Laury Axon - note reviewed - treated for possible strep throat with amoxicillin. She returns today for continued sore throat, myalgias, odynophagia. She denies any documented fevers, N/V/D. She has been taking amoxicillin as directed.  PMH, FamHx and SocHx reviewed for any changes and relevance. Current Outpatient Prescriptions on File Prior to Visit  Medication Sig Dispense Refill  . amoxicillin (AMOXIL) 875 MG tablet Take 1 tablet (875 mg total) by mouth 2 (two) times daily.  20 tablet  0  . ferrous gluconate (FERGON) 325 MG tablet Take 325 mg by mouth daily with breakfast.      . LamoTRIgine (LAMICTAL XR) 100 MG TB24 Take 100 mg by mouth 2 (two) times daily. Pt takes for seizures. Can on;y take bran lamictal XR.      . methocarbamol (ROBAXIN) 500 MG tablet Take 1 tablet (500 mg total) by mouth every 6 (six) hours as needed.  40 tablet  1  . Norethindrone-Ethinyl Estradiol-Fe (GENERESS FE) 0.8-25 MG-MCG tablet Chew 1 tablet by mouth daily.      Marland Kitchen oxyCODONE-acetaminophen (PERCOCET/ROXICET) 5-325 MG per tablet Take 1-2 tablets by mouth every 4 (four) hours as needed.  60 tablet  0      Review of Systems System review is negative for any constitutional, cardiac, pulmonary, GI or neuro symptoms or complaints other than as described in the HPI.     Objective:   Physical Exam Filed Vitals:   09/30/12 1140  BP: 142/90  Pulse: 98  Temp: 98.3 F (36.8 C)  Resp: 12   gen'l - WNWD white woman in no distress HEENT - C&S clear, oropharnx  - very large tonsils w/o erythema or frank exudate, posterior pharynx w/o erythema or exudate. Neck - supple Nodes - no submandibular or cervical adenopathy Cor - RRR Pulm - normal respirations Neuro - A&O x 3  Rapid Strep Screen - negative       Assessment & Plan:  Sore throat - unremarkable exam. Strep  screen negative. Has been on amoxicillin x 4 doses!  Plan Complete antibiotics  Robitussin DM (generic) q4 while awake  PHenergan/codeine cough syrup qHS  Supportive care.

## 2013-01-07 ENCOUNTER — Telehealth: Payer: Self-pay

## 2013-01-07 NOTE — Telephone Encounter (Signed)
Dr Dohmeier patient calling to request that we supply a Lamictal starter pack for her.  Said she stopped taking meds for a while and wants to go back on them, so she knows she will need to titrate up on dose.  Dr Dohmeier is out of the office forwarding request to Dr Eusebio Me this morning.  Okay to dispense starter pack and have patient restart medication?  Please advise.  Thank you.

## 2013-01-07 NOTE — Telephone Encounter (Signed)
By viewing old system,  It doesn't appear patient has been in for OV since July 2012.  I called the patient back.  She said she would call back to schedule appt.  She has not had any symptoms, just has gone on and off the medication in the past and thought she may want to go back on it again.  Had issues with her ins co-pay previously.  She has not been on this medication since the end of last year.  States she will schedule OV and get labs checked if needed, and will discuss what she needs to do going forward with her medication (and possible changes).

## 2013-01-07 NOTE — Telephone Encounter (Signed)
Please have pt make an appointment, since she has not been seen in almost 2 years. I would not feel comfortable just calling in a Lamictal starter pack for her at this point.

## 2013-01-29 ENCOUNTER — Ambulatory Visit: Payer: Self-pay | Admitting: Neurology

## 2013-02-19 ENCOUNTER — Emergency Department (HOSPITAL_COMMUNITY): Payer: Medicaid Other

## 2013-02-19 ENCOUNTER — Encounter (HOSPITAL_COMMUNITY): Payer: Self-pay

## 2013-02-19 ENCOUNTER — Emergency Department (HOSPITAL_COMMUNITY)
Admission: EM | Admit: 2013-02-19 | Discharge: 2013-02-19 | Disposition: A | Discharge status: Home / Self Care | Payer: Medicaid Other | Attending: Emergency Medicine | Admitting: Emergency Medicine

## 2013-02-19 DIAGNOSIS — M79672 Pain in left foot: Secondary | ICD-10-CM

## 2013-02-19 DIAGNOSIS — Z79899 Other long term (current) drug therapy: Secondary | ICD-10-CM | POA: Insufficient documentation

## 2013-02-19 DIAGNOSIS — G40909 Epilepsy, unspecified, not intractable, without status epilepticus: Secondary | ICD-10-CM | POA: Insufficient documentation

## 2013-02-19 DIAGNOSIS — J45909 Unspecified asthma, uncomplicated: Secondary | ICD-10-CM | POA: Insufficient documentation

## 2013-02-19 DIAGNOSIS — Q615 Medullary cystic kidney: Secondary | ICD-10-CM | POA: Insufficient documentation

## 2013-02-19 DIAGNOSIS — M25579 Pain in unspecified ankle and joints of unspecified foot: Secondary | ICD-10-CM | POA: Insufficient documentation

## 2013-02-19 DIAGNOSIS — Z8719 Personal history of other diseases of the digestive system: Secondary | ICD-10-CM | POA: Insufficient documentation

## 2013-02-19 DIAGNOSIS — Z87448 Personal history of other diseases of urinary system: Secondary | ICD-10-CM | POA: Insufficient documentation

## 2013-02-19 DIAGNOSIS — Z8679 Personal history of other diseases of the circulatory system: Secondary | ICD-10-CM | POA: Insufficient documentation

## 2013-02-19 MED ORDER — HYDROCODONE-ACETAMINOPHEN 5-325 MG PO TABS: ORAL_TABLET | ORAL | Status: DC

## 2013-02-19 NOTE — ED Notes (Signed)
Pt presents with NAD- foot pain x 1 month.  Taken OTC meds without relief. 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

## 2013-02-19 NOTE — ED Provider Notes (Signed)
History     CSN: [id number]  Arrival date & time 02/19/13  1209   First MD Initiated Contact with Patient 02/19/13 1217      Chief Complaint  Patient presents with  . Foot Pain    x 1 month    (Consider location/radiation/quality/duration/timing/severity/associated sxs/prior treatment) HPI Pt is a 27yo female c/o left foot pain x1 month which is sharp in nature, 9/10 with weight bearing and walking, 3/10 at rest.  Does not recall any specific injury. Pain radiates up into calf and left knee but no localized knee or calf pain.  Has tried tylenol w/o relief.  Has not injured foot in past.  Rest makes pain better.  Denies redness, warmth, or swelling of foot or ankle.  No hx of gout.   Past Medical History  Diagnosis Date  . Medullary sponge kidney   . Absence of menstruation 10/27/2009  . ASTHMA NOS W/ACUTE EXACERBATION 01/05/2010  . MIGRAINES, HX OF 06/24/2007  . PYELONEPHRITIS 06/24/2007  . Seizures   . Gallstones     Past Surgical History  Procedure Laterality Date  . Back surgery    . Lumbar laminectomy/decompression microdiscectomy  07/24/2012    Procedure: LUMBAR LAMINECTOMY/DECOMPRESSION MICRODISCECTOMY;  Surgeon: Jacki Cones, MD;  Location: [facility];  Service: Orthopedics;  Laterality: Right;  L5-S1    Family History  Problem Relation Age of Onset  . Anesthesia problems Neg Hx   . Heart disease Mother     History  Substance Use Topics  . Smoking status: Never Smoker   . Smokeless tobacco: Never Used  . Alcohol Use: No    OB History   Grav Para Term Preterm Abortions TAB SAB Ect Mult Living   2 2 2  0 0 0 0 0 0 2      Review of Systems  Constitutional: Negative for fever and chills.  Musculoskeletal: Positive for myalgias and arthralgias. Negative for back pain, joint swelling and gait problem.  Skin: Negative for color change, pallor, rash and wound.    Allergies  Other; Ciprofloxacin; Nsaids; Topiramate; and Triptans  Home Medications   Current  Outpatient Rx  Name  Route  Sig  Dispense  Refill  . acetaminophen (TYLENOL) 500 MG tablet   Oral   Take 500 mg by mouth every 6 (six) hours as needed for pain.         . ferrous gluconate (FERGON) 325 MG tablet   Oral   Take 325 mg by mouth daily with breakfast.         . HYDROcodone-acetaminophen (NORCO/VICODIN) 5-325 MG per tablet      Take 1-2 tabs every 4-6 hours as needed for pain   6 tablet   0   . LamoTRIgine (LAMICTAL XR) 100 MG TB24   Oral   Take 100 mg by mouth 2 (two) times daily. Pt takes for seizures. Can on;y take bran lamictal XR.           BP 117/66  Pulse 72  Temp(Src) 98.6 F (37 C) (Oral)  Resp 18  SpO2 100%  LMP 01/23/2013  Physical Exam  Nursing note and vitals reviewed. Constitutional: She appears well-developed and well-nourished. No distress.  HENT:  Head: Normocephalic and atraumatic.  Eyes: Conjunctivae are normal. No scleral icterus.  Neck: Normal range of motion.  Cardiovascular: Normal rate, regular rhythm and normal heart sounds.   Pulmonary/Chest: Effort normal and breath sounds normal. No respiratory distress. She has no wheezes. She has no rales. She  exhibits no tenderness.  Musculoskeletal: Normal range of motion. She exhibits tenderness ( ttp left foot, greatest on lateral portion. ). She exhibits no edema.  No ttp of left knee or calf.  No erythema, warmth, or edema. No ecchymosis or wound.  Pain with weight bearing.  Limited plantarflexion and dorsiflexion due to pain.  Neurological: She is alert.  Skin: Skin is warm and dry. She is not diaphoretic.  Psychiatric: She has a normal mood and affect. Her behavior is normal.    ED Course  Procedures (including critical care time)  Labs Reviewed - No data to display Dg Foot Complete Left  02/19/2013   *RADIOLOGY REPORT*  Clinical Data: Left foot pain and swelling, symptoms for 1 month, no known injury  LEFT FOOT - COMPLETE 3+ VIEW  Comparison: None  Findings: Large accessory  ossicle at medial margin of foot adjacent to tarsal navicular, normal variant. Osseous mineralization normal. Joint spaces preserved. No acute fracture, dislocation or bone destruction.  IMPRESSION: No acute osseous abnormalities.   Original Report Authenticated By: Ulyses Southward, M.D.     1. Left foot pain       MDM  Pt c/o left foot pain x1 mo. No known trauma. Weight bearing makes worse. Tylenol-minimal relief.  No left knee or calf tenderness. Pt low risk via [facility] for DVT.  No edema, FROM, tenderness of left food, greatest on lateral portion.  No erythema, warmth, ecchymosis, or wound.  Plain films: no acute osseous abnormalities.   Rx: aso splint, norco, and crutches.  Pt already has prescriptions for acetaminophen and ibuprofen.  May take NSAIDs for pain and swelling.  W/u with PCP next week if pain not improving, may need referral to orthopedist.  Vitals: unremarkable. Discharged in stable condition.                Junius Finner, PA-C 02/19/13 1545

## 2013-02-19 NOTE — ED Notes (Signed)
MD at bedside. 

## 2013-02-19 NOTE — ED Provider Notes (Signed)
Medical screening examination/treatment/procedure(s) were performed by non-physician practitioner and as supervising physician I was immediately available for consultation/collaboration.    Verdella Laidlaw R Rush Salce, MD 02/19/13 1612 

## 2013-04-12 ENCOUNTER — Encounter (HOSPITAL_COMMUNITY): Payer: Self-pay

## 2013-04-12 ENCOUNTER — Emergency Department (HOSPITAL_COMMUNITY)
Admission: EM | Admit: 2013-04-12 | Discharge: 2013-04-12 | Disposition: A | Discharge status: Home / Self Care | Payer: Medicaid Other | Attending: Emergency Medicine | Admitting: Emergency Medicine

## 2013-04-12 DIAGNOSIS — Z87448 Personal history of other diseases of urinary system: Secondary | ICD-10-CM | POA: Insufficient documentation

## 2013-04-12 DIAGNOSIS — IMO0002 Reserved for concepts with insufficient information to code with codable children: Secondary | ICD-10-CM | POA: Insufficient documentation

## 2013-04-12 DIAGNOSIS — G40909 Epilepsy, unspecified, not intractable, without status epilepticus: Secondary | ICD-10-CM | POA: Insufficient documentation

## 2013-04-12 DIAGNOSIS — Z8742 Personal history of other diseases of the female genital tract: Secondary | ICD-10-CM | POA: Insufficient documentation

## 2013-04-12 DIAGNOSIS — Z87718 Personal history of other specified (corrected) congenital malformations of genitourinary system: Secondary | ICD-10-CM | POA: Insufficient documentation

## 2013-04-12 DIAGNOSIS — R569 Unspecified convulsions: Secondary | ICD-10-CM

## 2013-04-12 DIAGNOSIS — Z79899 Other long term (current) drug therapy: Secondary | ICD-10-CM | POA: Insufficient documentation

## 2013-04-12 DIAGNOSIS — Z8719 Personal history of other diseases of the digestive system: Secondary | ICD-10-CM | POA: Insufficient documentation

## 2013-04-12 DIAGNOSIS — J45909 Unspecified asthma, uncomplicated: Secondary | ICD-10-CM | POA: Insufficient documentation

## 2013-04-12 DIAGNOSIS — Z3202 Encounter for pregnancy test, result negative: Secondary | ICD-10-CM | POA: Insufficient documentation

## 2013-04-12 DIAGNOSIS — G43909 Migraine, unspecified, not intractable, without status migrainosus: Secondary | ICD-10-CM | POA: Insufficient documentation

## 2013-04-12 LAB — CBC WITH DIFFERENTIAL/PLATELET
Basophils Relative: 0 % (ref 0–1)
Eosinophils Absolute: 0.1 10*3/uL (ref 0.0–0.7)
Eosinophils Relative: 1 % (ref 0–5)
Hemoglobin: 12.7 g/dL (ref 12.0–15.0)
Lymphs Abs: 2.4 10*3/uL (ref 0.7–4.0)
MCH: 28.5 pg (ref 26.0–34.0)
MCHC: 33.5 g/dL (ref 30.0–36.0)
MCV: 85.2 fL (ref 78.0–100.0)
Monocytes Absolute: 0.5 10*3/uL (ref 0.1–1.0)
Monocytes Relative: 5 % (ref 3–12)
RBC: 4.45 MIL/uL (ref 3.87–5.11)

## 2013-04-12 LAB — URINALYSIS, ROUTINE W REFLEX MICROSCOPIC
Bilirubin Urine: NEGATIVE
Hgb urine dipstick: NEGATIVE
Ketones, ur: NEGATIVE mg/dL
Protein, ur: NEGATIVE mg/dL
Specific Gravity, Urine: 1.03 (ref 1.005–1.030)
Urobilinogen, UA: 1 mg/dL (ref 0.0–1.0)

## 2013-04-12 LAB — BASIC METABOLIC PANEL
BUN: 12 mg/dL (ref 6–23)
Calcium: 9.4 mg/dL (ref 8.4–10.5)
Creatinine, Ser: 0.7 mg/dL (ref 0.50–1.10)
GFR calc non Af Amer: >90 mL/min (ref >90)
Glucose, Bld: 94 mg/dL (ref 70–99)

## 2013-04-12 LAB — URINE MICROSCOPIC-ADD ON

## 2013-04-12 MED ORDER — LORAZEPAM 2 MG/ML IJ SOLN
1.0000 mg | Freq: Once | INTRAMUSCULAR | Status: AC
  Administered 2013-04-12: 1 mg via INTRAVENOUS
  Filled 2013-04-12: qty 1

## 2013-04-12 MED ORDER — SODIUM CHLORIDE 0.9 % IV BOLUS (SEPSIS)
1000.0000 mL | Freq: Once | INTRAVENOUS | Status: AC
  Administered 2013-04-12: 1000 mL via INTRAVENOUS

## 2013-04-12 NOTE — ED Notes (Signed)
Pt presents with c/o seizure. Pt was taking lamictal for her seizures about a year and a half ago but stopped the medication when she got pregnant. Pt says she got back on the medicine when her baby was born but she kept forgetting to take it. Pt says she called the doctor to get a starter pack to get her back up to her normal dosage but they would not give it to her until she follows up with the doctor, has an appointment in August. Pt says she had a seizure today, remembers being on her phone when she started to feel strange, then patient remembers waking up sitting on the couch with her head leaned back. Pt is stuttering at this time and very tearful when asked to speak. Pt says the stuttering is not normal when she has a seizure and this is a new symptom.

## 2013-04-12 NOTE — ED Notes (Signed)
She states she was formerly on Lamictal XR for seizures.  She did not take Lamictal curing a recent pregnancy; and more than a year has passed since she saw her neurologist, so they would not renew her  Prescription.  She feels that she had a seizure today about two hours ago which was not witnessed by anyone.  She feels post-ictal, and is stuttering, which is historically part of her post-ictal state.

## 2013-04-12 NOTE — ED Provider Notes (Signed)
History    CSN: [id number] Arrival date & time 04/12/13  1918  First MD Initiated Contact with Patient 04/12/13 1946     Chief Complaint  Patient presents with  . Seizures   (Consider location/radiation/quality/duration/timing/severity/associated sxs/prior Treatment) Patient is a 27 y.o. female presenting with seizures.  Seizures Seizure activity on arrival: no   Seizure type:  Unable to specify Episode characteristics comment:  Unwitnessed Severity:  Unable to specify Duration: unknown (pt thinks not long) Timing:  Once Progression:  Resolved Context: decreased sleep   Context: not fever   Context comment:  History of seizures, has been off antiepileptics.  Also reports decreased sleep.   History of seizures: yes    Past Medical History  Diagnosis Date  . Medullary sponge kidney   . Absence of menstruation 10/27/2009  . ASTHMA NOS W/ACUTE EXACERBATION 01/05/2010  . MIGRAINES, HX OF 06/24/2007  . PYELONEPHRITIS 06/24/2007  . Seizures   . Gallstones    Past Surgical History  Procedure Laterality Date  . Back surgery    . Lumbar laminectomy/decompression microdiscectomy  07/24/2012    Procedure: LUMBAR LAMINECTOMY/DECOMPRESSION MICRODISCECTOMY;  Surgeon: Jacki Cones, MD;  Location: [facility];  Service: Orthopedics;  Laterality: Right;  L5-S1   Family History  Problem Relation Age of Onset  . Anesthesia problems Neg Hx   . Heart disease Mother    History  Substance Use Topics  . Smoking status: Never Smoker   . Smokeless tobacco: Never Used  . Alcohol Use: No   OB History   Grav Para Term Preterm Abortions TAB SAB Ect Mult Living   2 2 2  0 0 0 0 0 0 2     Review of Systems  Constitutional: Negative for fever.  HENT: Negative for congestion.   Respiratory: Negative for cough and shortness of breath.   Cardiovascular: Negative for chest pain.  Gastrointestinal: Negative for nausea, vomiting, abdominal pain and diarrhea.  Neurological: Positive for seizures.   All other systems reviewed and are negative.    Allergies  Other; Ciprofloxacin; Nsaids; Topiramate; and Triptans  Home Medications   Current Outpatient Rx  Name  Route  Sig  Dispense  Refill  . acetaminophen (TYLENOL) 500 MG tablet   Oral   Take 500 mg by mouth every 6 (six) hours as needed for pain.         . ferrous gluconate (FERGON) 325 MG tablet   Oral   Take 325 mg by mouth daily with breakfast.         . naproxen sodium (ANAPROX) 220 MG tablet   Oral   Take 220 mg by mouth 2 (two) times daily with a meal.         . LamoTRIgine (LAMICTAL XR) 100 MG TB24   Oral   Take 100 mg by mouth 2 (two) times daily. Pt takes for seizures. Can on;y take bran lamictal XR.          BP 150/82  Pulse 79  Temp(Src) 98.6 F (37 C) (Oral)  Resp 12  Ht 5\' 3"  (1.6 m)  Wt 180 lb (81.647 kg)  BMI 31.89 kg/m2  SpO2 100%  LMP 03/20/2013 Physical Exam  Nursing note and vitals reviewed. Constitutional: She is oriented to person, place, and time. She appears well-developed and well-nourished. No distress.  HENT:  Head: Normocephalic and atraumatic.  Mouth/Throat: Oropharynx is clear and moist.  Eyes: Conjunctivae are normal. Pupils are equal, round, and reactive to light. No scleral icterus.  Neck: Neck supple.  Cardiovascular: Normal rate, regular rhythm, normal heart sounds and intact distal pulses.   No murmur heard. Pulmonary/Chest: Effort normal and breath sounds normal. No stridor. No respiratory distress. She has no rales.  Abdominal: Soft. Bowel sounds are normal. She exhibits no distension. There is no tenderness.  Musculoskeletal: Normal range of motion.  Neurological: She is alert and oriented to person, place, and time. She has normal strength. No cranial nerve deficit or sensory deficit. Coordination normal. GCS eye subscore is 4. GCS verbal subscore is 5. GCS motor subscore is 6.  Able to speak in complete sentences but has stuttering speech pattern.  Not slurred.   No dysphasia or aphasia.    Skin: Skin is warm and dry. No rash noted.  Psychiatric: She has a normal mood and affect. Her behavior is normal.    ED Course  Procedures (including critical care time) Labs Reviewed  CBC WITH DIFFERENTIAL - Abnormal; Notable for the following:    WBC 10.8 (*)    Neutro Abs 7.8 (*)    All other components within normal limits  BASIC METABOLIC PANEL - Abnormal; Notable for the following:    Potassium 3.4 (*)    All other components within normal limits  URINALYSIS, ROUTINE W REFLEX MICROSCOPIC - Abnormal; Notable for the following:    APPearance CLOUDY (*)    Leukocytes, UA TRACE (*)    All other components within normal limits  URINE MICROSCOPIC-ADD ON - Abnormal; Notable for the following:    Squamous Epithelial / LPF FEW (*)    Bacteria, UA FEW (*)    All other components within normal limits  URINE CULTURE  POCT PREGNANCY, URINE   No results found. 1. Seizure-like activity     MDM  27 yo female with history of seizures who reports possible seizure today while at home with her toddler.  Episode was unwitnessed.  Occurred several hours prior to arrival.  Has had a stuttering speech pattern since episode, but no other symptoms.  No head trauma, normal neurologic exam, well appearing, non toxic, no fevers.  Has had neuro imaging in the past.  Do not think repeat imaging today would be helpful.  She has a neurology appoint in about two weeks.  I do not think her stuttering speech pattern is representative of stroke or status epilepticus.  She ambulated prior to discharge.  Return precautions given.  dc'd with seizure precautions with her husband.    Candyce Churn, MD 04/13/13 [phone]

## 2013-04-14 ENCOUNTER — Telehealth: Payer: Self-pay | Admitting: Neurology

## 2013-04-14 NOTE — Telephone Encounter (Signed)
Pt had a seizure this past weekend, hospital advised her to come in sooner for a f/u. Pt has an apt 04/30/13 but is requesting to get in sooner, she is now stuttering from the seizure. Thanks

## 2013-04-14 NOTE — Telephone Encounter (Signed)
Spoke to patient. She is requesting an earlier appt than 04/30/13 b/c since last sz she is stuttering heavily. Advised not showing an earlier appt for Dr. Vickey Huger at this time, but would notify her of the stuttering as requested.

## 2013-04-15 LAB — URINE CULTURE

## 2013-04-15 NOTE — Telephone Encounter (Signed)
Spoke to patient, scheduled appt w/ NP-Lynn on 04/21/13. Patient only available on Mondays when she has a babysitter.

## 2013-04-15 NOTE — Telephone Encounter (Signed)
Please :   [name], can you see if one of the NPs has an earlier apponitment for this seizure patient . THANKs.

## 2013-04-19 ENCOUNTER — Encounter: Payer: Self-pay | Admitting: Nurse Practitioner

## 2013-04-21 ENCOUNTER — Ambulatory Visit (INDEPENDENT_AMBULATORY_CARE_PROVIDER_SITE_OTHER): Payer: Medicaid Other | Admitting: Nurse Practitioner

## 2013-04-21 ENCOUNTER — Encounter: Payer: Self-pay | Admitting: Nurse Practitioner

## 2013-04-21 VITALS — BP 130/78 | HR 92 | Temp 98.4°F | Wt 195.0 lb

## 2013-04-21 DIAGNOSIS — F8081 Childhood onset fluency disorder: Secondary | ICD-10-CM

## 2013-04-21 DIAGNOSIS — F429 Obsessive-compulsive disorder, unspecified: Secondary | ICD-10-CM

## 2013-04-21 DIAGNOSIS — F419 Anxiety disorder, unspecified: Secondary | ICD-10-CM

## 2013-04-21 DIAGNOSIS — F985 Adult onset fluency disorder: Secondary | ICD-10-CM

## 2013-04-21 DIAGNOSIS — R569 Unspecified convulsions: Secondary | ICD-10-CM

## 2013-04-21 DIAGNOSIS — F411 Generalized anxiety disorder: Secondary | ICD-10-CM

## 2013-04-21 MED ORDER — LAMICTAL XR 100 MG PO TB24: 1.0000 | ORAL_TABLET | Freq: Every day | ORAL | Status: DC

## 2013-04-21 NOTE — Progress Notes (Signed)
[facility]  PATIENT: [name] DOB: [birth year]   HISTORY FROM: patient, chart REASON FOR VISIT: follow up seizure   HISTORICAL  CHIEF COMPLAINT:  Chief Complaint  Patient presents with  . Follow-up    Rv #9    HISTORY OF PRESENT ILLNESS: [name] is a 27 year old Caucasian right handed female with a history of one clinical seizure. She believes this was a full body convulsion, grand mal at age 63 years or perhaps 68. She was a Biochemist, clinical and participated in all sports, but during her teenage years developed exercise-induced asthma. Patient had a back surgery at age 76 in 72 on lumbar spine.  She had not seized again, until February of 2012, when she had a second seizure. Her boyfriend states this event was unprovoked, as far as she recalls, too. She stared, her eyes rolled back, blank stare and she became rigid. She has myoclonic jerks a couple of times a month, has back pain, and insomnia. Insomnia since October 2011. She has myoclonic jerks at times and she had called her and her school years complaints from teachers about her spacing out. She will continue what she was doing in an automatic fashion. She was driving and did not remember the last mile. EEG, normal the patient was called with the results.  01/23/2011 (CD): Patient presents today for work-in appointment for recently discovered pregnancy. I explained that I do not want her on any medication in to the 16th week of pregnancy (gestational age) is completed to avoid teratogenicity risks. Her sleep is deeper and she is neither nauseated nor did she have any spells but she is no longer on the Lamictal. May 9 is her GYN appointment Dr. Jennette Kettle at [facility].  04/12/2011 (CD): Patient is using Phenergan to sleep and treat nausea during pregnancy. No seizures but "spacey spells ", eyes twitching. Lump in the throat feeling. She is petite, depressed. Tearful= she made some work changes, since  she developed chronic emesis. She has vision changes, feeling of days with blurring, described as if her "eyes are sinking into my head ".  04/21/2013 (LL):  Patient presents today for work in appointment after her recent unwitnessed seizure on July 19.  Patient has been off of Lamictal X. are since pregnancy of 2012.  She states she has a really hard time remembering to take medication on a consistent basis.  On July 19, patient states she was home alone with 22-year-old and remembers looking down at her phone and feeling disconnected. She then awoke sitting on the sofa, feeling extremely tired with pain in the back of her head and neck. She started stuttering which has persisted since the episode.  She called her husband who came home from work. She called the office and was told by the on-call M.D. to go to the ER. At the ER no imaging was done blood and urine samples were taken and 1 mg Ativan IV was given which calmed her symptoms but this stuttering persisted. No imaging was done.  She was released home. No antiseizure medicine was started.  Patient reports that since the episode her insomnia is worse and she is racing thoughts. Her right upper eye is twitching frequently.  She feels like she cannot sit still.  She feels that she cannot get enough sleep. On questioning patient has many OCD-type rituals. e.g. (silverware have to be placed a certain way and contained with like kinds, household items must be arranged alphabetically, etc.)  REVIEW OF SYSTEMS: Full 14 system review of systems performed and notable only for:  Constitutional: N/A  Cardiovascular: N/A  Ear/Nose/Throat: N/A  Skin: N/A  Eyes: N/A  Respiratory: N/A  Gastroitestinal: N/A  Hematology/Lymphatic: N/A  Endocrine: N/A Musculoskeletal:N/A  Allergy/Immunology: N/A  Neurological: Seizure Psychiatric: Anxiety, none of sleep, racing thoughts Sleep: Insomnia   ALLERGIES: Allergies  Allergen Reactions  . Other Anaphylaxis     balsalmic vingerette  . Ciprofloxacin Other (See Comments)    Patient stated that her arm started burning really intensely.  . Nsaids Other (See Comments)    Patient states that she cant remember what her reaction to the medication was.  . Topiramate Other (See Comments)    Reaction unknown  . Triptans     Happened maybe in '05 possibly, reaction unsure.     HOME MEDICATIONS: Outpatient Prescriptions Prior to Visit  Medication Sig Dispense Refill  . acetaminophen (TYLENOL) 500 MG tablet Take 500 mg by mouth every 6 (six) hours as needed for pain.      . ferrous gluconate (FERGON) 325 MG tablet Take 325 mg by mouth daily with breakfast.      . LamoTRIgine (LAMICTAL XR) 100 MG TB24 Take 100 mg by mouth 2 (two) times daily. Pt takes for seizures. Can on;y take bran lamictal XR.      . naproxen sodium (ANAPROX) 220 MG tablet Take 220 mg by mouth 2 (two) times daily with a meal.       No facility-administered medications prior to visit.    PAST MEDICAL HISTORY: Past Medical History  Diagnosis Date  . Medullary sponge kidney   . Absence of menstruation 10/27/2009  . ASTHMA NOS W/ACUTE EXACERBATION 01/05/2010  . MIGRAINES, HX OF 06/24/2007  . PYELONEPHRITIS 06/24/2007  . Seizures   . Gallstones   . IBS (irritable bowel syndrome)     PAST SURGICAL HISTORY: Past Surgical History  Procedure Laterality Date  . Back surgery    . Lumbar laminectomy/decompression microdiscectomy  07/24/2012    Procedure: LUMBAR LAMINECTOMY/DECOMPRESSION MICRODISCECTOMY;  Surgeon: Jacki Cones, MD;  Location: [facility];  Service: Orthopedics;  Laterality: Right;  L5-S1    FAMILY HISTORY: Family History  Problem Relation Age of Onset  . Anesthesia problems Neg Hx   . Heart disease Mother     SOCIAL HISTORY: History   Social History  . Marital Status: Single    Spouse Name: N/A    Number of Children: N/A  . Years of Education: N/A   Occupational History  . Not on file.   Social History Main  Topics  . Smoking status: Never Smoker   . Smokeless tobacco: Never Used  . Alcohol Use: No  . Drug Use: No  . Sexually Active: Yes -- Female partner(s)    Birth Control/ Protection: None   Other Topics Concern  . Not on file   Social History Narrative   HSG, finished 2 years of college. single mom with 2 sons blake - Aug '09, Elijah Dec '12. 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 Still lives with father of her sons with wedding plans on hold ( Jan '13)     PHYSICAL EXAM  Filed Vitals:   04/21/13 1012  BP: 130/78  Pulse: 92  Temp: 98.4 F (36.9 C)  TempSrc: Oral  Weight: 195 lb (88.451 kg)   Body mass index is 34.55 kg/(m^2).  Generalized: In no acute distress, very anxious, pleasant obese Caucasian  female  Neck: Supple, no carotid bruits   Cardiac: Regular rate rhythm, no murmur   Pulmonary: Clear to auscultation bilaterally   Musculoskeletal: Tenderness of the paraspinal cervical area and over both levator scapulae  Neurological examination   Mentation: Alert oriented to time, place, history taking, language fluent, and causual conversation  Cranial nerve II-XII: Pupils were equal round reactive to light extraocular movements were full, visual field were full on confrontational test. facial sensation and strength were normal. hearing was intact to finger rubbing bilaterally. Uvula tongue midline. head turning and shoulder shrug and were normal and symmetric.Tongue protrusion into cheek strength was normal. MOTOR: normal bulk and tone, full strength in the BUE, BLE, fine finger movements normal, no pronator drift SENSORY: normal and symmetric to light touch, pinprick, temperature, vibration and proprioception COORDINATION: finger-nose-finger normal on the right, and rotation/supination weakness on the left arm, heel-to-shin normal bilaterally, there was no truncal ataxia.  REFLEXES: Brachioradialis 2/2, biceps 2/2, triceps 2/2, patellar  2/2, Achilles 2/2, plantar responses were flexor bilaterally. GAIT/STATION: Rising up from seated position without assistance, normal stance, without trunk ataxia, moderate stride, good arm swing, smooth turning, able to perform tiptoe, and heel walking without difficulty.   DIAGNOSTIC DATA (LABS, IMAGING, TESTING) - I reviewed patient records, labs, notes, testing and imaging myself where available.  Lab Results  Component Value Date   WBC 10.8* 04/12/2013   HGB 12.7 04/12/2013   HCT 37.9 04/12/2013   MCV 85.2 04/12/2013   PLT 299 04/12/2013      Component Value Date/Time   NA 137 04/12/2013 2045   K 3.4* 04/12/2013 2045   CL 102 04/12/2013 2045   CO2 24 04/12/2013 2045   GLUCOSE 94 04/12/2013 2045   BUN 12 04/12/2013 2045   CREATININE 0.70 04/12/2013 2045   CALCIUM 9.4 04/12/2013 2045   PROT 7.9 07/23/2012 1030   ALBUMIN 3.7 07/23/2012 1030   AST 15 07/23/2012 1030   ALT 13 07/23/2012 1030   ALKPHOS 74 07/23/2012 1030   BILITOT 0.2* 07/23/2012 1030   GFRNONAA >90 04/12/2013 2045   GFRAA >90 04/12/2013 2045    ASSESSMENT AND PLAN  27 y.o. year old female  has a past medical history of Medullary sponge kidney; Absence of menstruation (10/27/2009); ASTHMA NOS W/ACUTE EXACERBATION (01/05/2010); MIGRAINES, HX OF (06/24/2007); PYELONEPHRITIS (06/24/2007); Seizures; Gallstones; Insomnia and Anxiety here with recent presumed seizure on no antiepileptics, and acquired stuttering since the episode.  PLAN: Restart Lamictal XR, starter sample pack given 25/50/75/100, with ending dose of 100 mg XR daily.  Referral to speech therapy for stuttering. Repeat EEG waking state. Referral to Andee Poles, MD for anxiety and OCD. Return to office in 6 weeks.  Orders Placed This Encounter  Procedures  . Ambulatory referral to Speech Therapy  . Ambulatory referral to Psychiatry  . EEG adult    Meds ordered this encounter  Medications  . LAMICTAL XR 100 MG TB24    Sig: Take 1 tablet (100 mg total) by  mouth daily.    Dispense:  30 tablet    Refill:  11    Order Specific Question:  Supervising Provider    Answer:  Vickey Huger, CARMEN [[id number]]   Analei Whinery NP-C 04/21/2013, 11:39 AM  [facility] [address], [address] [place], [place] [address] [phone]

## 2013-04-21 NOTE — Patient Instructions (Addendum)
Start Lamictal Starter Pack Samples.  When finished, prescription at Pharmacy. Take 1 tablet daily.  Referral sent to Andee Poles, MD Phychiatrist  Referral to Speech Therapy  EEG, waking state  Keep next appt.

## 2013-04-22 ENCOUNTER — Telehealth: Payer: Self-pay | Admitting: Neurology

## 2013-04-23 NOTE — Telephone Encounter (Signed)
I called pt and she is on the second day of lamictal 25mg  po daily starter pack.  30 minutes after taking medication the first day had L inner elbow crease turned bright red and blotchy, then dk red, bruised.  Today sore to touch.  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  I told her I would relay and if there are any changes with therapy will contact her. She verbalized understanding.

## 2013-04-23 NOTE — Telephone Encounter (Signed)
Is this anything she should be worried about?  She was on this medicine before.

## 2013-04-25 ENCOUNTER — Other Ambulatory Visit: Payer: Self-pay

## 2013-04-25 MED ORDER — LAMICTAL XR 100 MG PO TB24: 1.0000 | ORAL_TABLET | Freq: Every day | ORAL | Status: DC

## 2013-04-25 NOTE — Telephone Encounter (Signed)
Since Rx is BMN, patients insurance (Medicaid) requires a Rx be manually faxed with the notation "Brand Medically Necessary" handwritten on it.

## 2013-04-25 NOTE — Telephone Encounter (Signed)
I called pt and check on her.   No other areas, or rashes.  Just bruise and soreness on that L inner arm.  She will monitor.  Also asking about brand name lamictal , questioning whether we received form from [facility] about this.  I conferred with Shanda Bumps, she will f/u on.  We have not received form.

## 2013-04-25 NOTE — Telephone Encounter (Signed)
I will submit all info requested to ins today.  She has changed ins since she was last on this medication.  Medicaid may not authorize med, per their formulary, this is not a preferred drug.  Must wait for their response which can take 10 business days.  In the meantime, patient was given a starter pack which lasts for 4 weeks.

## 2013-04-28 ENCOUNTER — Other Ambulatory Visit (INDEPENDENT_AMBULATORY_CARE_PROVIDER_SITE_OTHER): Payer: Medicaid Other | Admitting: Radiology

## 2013-04-28 ENCOUNTER — Telehealth: Payer: Self-pay | Admitting: *Deleted

## 2013-04-28 DIAGNOSIS — R569 Unspecified convulsions: Secondary | ICD-10-CM

## 2013-04-28 NOTE — Telephone Encounter (Signed)
Pt here having EEG and noted some 30sec facial twitching after some strobe lights , with residual R numbing/tingling noted.  Dr. Vickey Huger informed and saw pt.

## 2013-04-30 ENCOUNTER — Ambulatory Visit: Payer: Self-pay | Admitting: Neurology

## 2013-05-02 NOTE — Progress Notes (Signed)
[facility]  EEG (ELECTROENCEPHALOGRAM) REPORT   STUDY DATE:  Eighth-12-2012, recorded time 30.2 minutes. The study included photic stimulation. PATIENT NAME: DOB:  MRN:  ORDERING CLINICIAN: Melvyn Novas, MD   TECHNOLOGIST:  FOX TECHNIQUE:   RECORDING TIME: 30.5 minutes   CLINICAL INFORMATION: [name] is a 27 year old female , nor mother presenting with a possible seizure unwitnessed on 04/12/2013. The patient has a history of seizures between age 65 or and 12 years and then was seizure free for 12 years until a second seizure occurred in 2012. The patient has a history of asthma ,  therefore hyperventilation was deferred.    A 11 Hz posterior dominant background rhythm is noted during the study,  there is no asymmetry, no focality no epileptiform discharge noted.  The patient was awake and briefly asleep during the study. Her heart rate remained in normal sinus rhythm at an average of 68 beats per minute. There was excessive muscle artifact and blinking noted during photic stimulation had no epileptiform activity in any EEG channel accompanied this development.  This is a normal EEG. The patient was called with these results.  Rajiv Parlato, MD       IMPRESSION: normal EEG    Melvyn Novas , MD

## 2013-05-19 ENCOUNTER — Ambulatory Visit: Payer: 59 | Admitting: Speech Pathology

## 2013-06-06 ENCOUNTER — Ambulatory Visit (INDEPENDENT_AMBULATORY_CARE_PROVIDER_SITE_OTHER): Payer: Medicaid Other | Admitting: Neurology

## 2013-06-06 ENCOUNTER — Encounter: Payer: Self-pay | Admitting: Neurology

## 2013-06-06 VITALS — BP 131/82 | HR 81 | Ht 61.0 in | Wt 192.0 lb

## 2013-06-06 DIAGNOSIS — G40802 Other epilepsy, not intractable, without status epilepticus: Secondary | ICD-10-CM

## 2013-06-06 DIAGNOSIS — F429 Obsessive-compulsive disorder, unspecified: Secondary | ICD-10-CM | POA: Insufficient documentation

## 2013-06-06 MED ORDER — LAMICTAL XR 100 MG PO TB24: 1.0000 | ORAL_TABLET | Freq: Every day | ORAL | Status: DC

## 2013-06-06 MED ORDER — LAMOTRIGINE ER 50 MG PO TB24: 50.0000 mg | ORAL_TABLET | ORAL | Status: DC

## 2013-06-06 NOTE — Progress Notes (Signed)
[facility]  Provider:  Melvyn Novas, M D  Referring Provider: Jacques Navy, MD Primary Care Physician:  Illene Regulus, MD  Chief Complaint  Patient presents with  . Follow-up    seizures,rm 11    HPI:  [name] is a 27 y.o. female  Is seen here as a referral/ revisit  from Dr. Debby Bud for  Spells,    [name] was last seen in our office on 04-21-13 by my nurse practitioner Levester Fresh. The patient had a history of one clinical seizure at age 60, he had exercise-induced asthma as a teenager and she had a back surgery at age 68 portable spine. Into thousand and 4 February she had a second seizure witnessed and as far as she recalls unprovoked. Became rigid her eyes rolled back and she lost awareness of her surroundings. She also has reported myoclonic jerks she also has insomnia and excessive daytime sleepiness. Her EEG was normal. Then on 04/12/2013 the patient had a second seizure in adulthood, had not been on seizure medication since her pregnancy in 2012. She reports that she was home alone with her 52-year-old child and remembers looking down performed feeling disconnected then lost control of her surroundings she of books sitting on the sofa feeling very tired with pain of the head and neck. She received 1 mg of Ativan intravenously and the local emergency room 1 no imaging was done.  The patient has reportedly still problems to get enough sleep and on questioning had many symptoms that would be obsessive-compulsive in origin please look at our last visit note for details.  I had initiated a medical as well as recommended strongly a psychology or psychiatric visit to evaluate for depression with OCD. She [facility] She has taken now 100 mg of X. are Lamictal daily. She is reportedly no problem with taking the medication or tolerating it without side effects.  She feels  that the needle had some benefit for him would answer all CT as well but she is still very hypersonic and fatigued, she appears also still somewhat depressed she lacks the energy to rise in the morning to begin to talk to do her household and feels easily exhausted by chores that in the past have not bothered her.     Review of Systems: Out of a complete 14 system review, the patient complains of only the following symptoms, and all other reviewed systems are negative. epworth 16 , FSS 47, Depression score not obtained today. Insomnia .  History   Social History  . Marital Status: Single    Spouse Name: N/A    Number of Children: 2  . Years of Education: 14   Occupational History  . 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 Smoking status: Never Smoker   . Smokeless tobacco: Never Used  . Alcohol Use: Yes     Comment: occas.  . Drug Use: No  . Sexual Activity: Yes    Partners: Male    Birth Control/ Protection: None   Other Topics Concern  . Not on file   Social History Narrative   HSG, finished 2 years of college. single mom with 2 sons blake - Aug '09, Elijah Dec '12. 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 Still lives with father of her sons with  wedding plans on hold ( Jan '13)    Family History  Problem Relation Age of Onset  . Anesthesia problems Neg Hx   . Heart disease Mother     Past Medical History  Diagnosis Date  . Medullary sponge kidney   . Absence of menstruation 10/27/2009  . ASTHMA NOS W/ACUTE EXACERBATION 01/05/2010  . MIGRAINES, HX OF 06/24/2007  . PYELONEPHRITIS 06/24/2007  . Seizures   . Gallstones   . IBS (irritable bowel syndrome)   . OCD (obsessive compulsive disorder)     Past Surgical History  Procedure Laterality Date  . Back surgery    . Lumbar laminectomy/decompression microdiscectomy  07/24/2012    Procedure: LUMBAR LAMINECTOMY/DECOMPRESSION MICRODISCECTOMY;  Surgeon:  Jacki Cones, MD;  Location: [facility];  Service: Orthopedics;  Laterality: Right;  L5-S1    Current Outpatient Prescriptions  Medication Sig Dispense Refill  . acetaminophen (TYLENOL) 500 MG tablet Take 500 mg by mouth every 6 (six) hours as needed for pain.      Marland Kitchen LAMICTAL XR 100 MG TB24 Take 1 tablet (100 mg total) by mouth daily.  30 tablet  11  . Norethindrone-Ethinyl Estradiol-Fe (GENERESS FE) 0.8-25 MG-MCG tablet Chew 1 tablet by mouth daily.       No current facility-administered medications for this visit.    Allergies as of 06/06/2013 - Review Complete 06/06/2013  Allergen Reaction Noted  . Other Anaphylaxis 09/01/2011  . Ciprofloxacin Other (See Comments)   . Nsaids Other (See Comments) 11/14/2010  . Topiramate Other (See Comments)   . Triptans  07/23/2012    Vitals: BP 131/82  Pulse 81  Ht 5\' 1"  (1.549 m)  Wt 192 lb (87.091 kg)  BMI 36.3 kg/m2 Last Weight:  Wt Readings from Last 1 Encounters:  06/06/13 192 lb (87.091 kg)   Last Height:   Ht Readings from Last 1 Encounters:  06/06/13 5\' 1"  (1.549 m)   Body mass index is 34.55 kg/(m^2).  Generalized: In no acute distress, very anxious and tense appearing , pleasant obese Caucasian female  Neck: Supple, no carotid bruits  Cardiac: Regular rate rhythm, no murmur.  Pulmonary: Clear to auscultation bilaterally.  Musculoskeletal: Tenderness of the paraspinal cervical area and over both levator scapulae .  Neurological examination  Mentation: Alert oriented to time, place, history taking, language fluent, and causual conversation . The patient is able to smile today, lost weight and has reportedly easier control of her appetite and OCD symptoms.   Cranial nerve II-XII: Pupils were equal round reactive to light extraocular movements were full, visual field were full on confrontational test. facial sensation and strength were normal. hearing was intact to finger rubbing bilaterally. Uvula tongue midline. head turning  and shoulder shrug and were normal and symmetric.Tongue protrusion into cheek strength was normal.  MOTOR: normal bulk and tone, full strength in the BUE, BLE, fine finger movements normal, no pronator drift  SENSORY: normal and symmetric to light touch, pinprick, temperature, vibration and proprioception  COORDINATION: finger-nose-finger normal on the right, and rotation/supination weakness on the left arm, heel-to-shin normal bilaterally, there was no truncal ataxia.  REFLEXES: Brachioradialis 2/2, biceps 2/2, triceps 2/2, patellar 2/2, Achilles 2/2, plantar responses were flexor bilaterally.  GAIT/STATION: Rising up from seated position without assistance, normal stance, without trunk ataxia, moderate stride, good arm swing, smooth turning, able to perform tiptoe, and heel walking without difficulty.   DIAGNOSTIC DATA (LABS, IMAGING, TESTING)  - I reviewed patient records, labs, notes, testing and imaging myself where  available.   Assessment: I like for Mrs. Minter to continue taking the Lamictal at 100 mg X. are as it seems to have had a positive effect on her mood and compulsion. It increased in [place] is definitely possible. I will wait to see the recommendations of her psychiatrist from October 's scheduled visit . Mrs. Chelsea Barker has trouble initiating weight loss, and especially her nephrologist has urged her to work on this problem. At a body mass index of 35 she is considered obese. As the patient is not diabetic I suggested an intermittent fasting for weight loss. She should reduce her calorie intake to 500 kcal one day a week but drink plenty of fluids preferably water on. I would like her to establish an exercise program for 25 minutes a day. On file day a week as likely to help her not regained weight that may not produce the desired weight loss, for that she may need two,  non-consecutive days of Fast.  My plan for Mrs. Chelsea Barker is to see her once a year in this office for medication  management.

## 2013-06-06 NOTE — Patient Instructions (Signed)
Seizure medication XR form generic allowed, 150 mg lamictal , consistent of  One tab of 100 and one tab of  50 mg ,po.

## 2013-06-26 ENCOUNTER — Ambulatory Visit (INDEPENDENT_AMBULATORY_CARE_PROVIDER_SITE_OTHER): Payer: Medicaid Other | Admitting: Psychiatry

## 2013-06-26 ENCOUNTER — Encounter (HOSPITAL_COMMUNITY): Payer: Self-pay | Admitting: Psychiatry

## 2013-06-26 ENCOUNTER — Telehealth: Payer: Self-pay | Admitting: Neurology

## 2013-06-26 VITALS — BP 129/88 | HR 82 | Ht 62.0 in | Wt 189.2 lb

## 2013-06-26 DIAGNOSIS — F909 Attention-deficit hyperactivity disorder, unspecified type: Secondary | ICD-10-CM

## 2013-06-26 DIAGNOSIS — F411 Generalized anxiety disorder: Secondary | ICD-10-CM

## 2013-06-26 DIAGNOSIS — F429 Obsessive-compulsive disorder, unspecified: Secondary | ICD-10-CM

## 2013-06-26 MED ORDER — FLUOXETINE HCL 10 MG PO CAPS: ORAL_CAPSULE | ORAL | Status: DC

## 2013-06-26 MED ORDER — CLONAZEPAM 0.5 MG PO TABS: ORAL_TABLET | ORAL | Status: DC

## 2013-06-26 NOTE — Progress Notes (Addendum)
[facility] Initial Assessment Note  [name] [id number] 27 y.o.  06/26/2013 11:51 AM  Chief Complaint:  Establish care, OCD, anxiety and depression.    History of Present Illness:  Patient is 27 year old unemployed Caucasian female who is referred from her neurologist for seeking treatment for her OCD, anxiety, ADHD and depressive symptoms.  Patient started the symptoms for more than 5 years ago when she has first baby born however the symptoms subsided until 2 years ago when she had her second baby.  Patient reported recently having irritability anger mood swings excessive anxiety and nervousness.  She reported her parts are easily distracted.  She admitted obsession and rituals about numbers, organization and counting.  She endorse things has to be in a certain way otherwise she gets very anxious and irritable.  She gives examples of organizing her utensils and dishwasher, keep checking her candles if she leaves home because she believed the house will burn, have to turn pump knobb 3 times before pumping gas.  Patient also endorsed poor sleep, racing thoughts, having trust issues with her boy friend.  She admitted before she goes to sleep are mind races more than 100 miles an hour.  She admitted having panic attacks getting easily distracted and unable to do multitasking.  She endorsed having trust issue with the boyfriend and she had a device in his phone so wherever he goes she can follow him.  Patient admitted decreased energy and excessive worrying about anything.  She also endorses feeling from bridge and height.  She usually closes her eyes when she is traveling on the bridge.  She denies any fear from contamination.  She feels sometimes a sad irritable and anxious however denies any active or passive suicidal thoughts or homicidal thoughts.  Patient also endorsed limited social network because she does not feed comfortable in public places.  She is in a school however doing  online courses because she's not comfortable around people.  She gave the example that in the class if she is sitting in the front she is worried who is sitting in the back and she said at the back she is worried who is sitting in the front.  She does not get along with her sister who has diagnosed initially with bipolar disorder and ADHD.  Recently she seen her neurologist and decided to have racing thoughts, chest pain and dizziness and her neurologist felt that she is having a panic attack and she should see a psychiatrist.  Patient denies any paranoia or any hallucination.  She denies any aggression violence or any homicidal thoughts.  She denies any PTSD symptoms, nightmare a flashback.  Currently she is taking Lamictal which was recently increased to 150 mg for a seizure disorder.  Suicidal Ideation: No Plan Formed: No Patient has means to carry out plan: No  Homicidal Ideation: No Plan Formed: No Patient has means to carry out plan: No  Medical History; Patient has seizure disorder.  She started having grand mal seizures at early age.  She denies any history of head trauma.  Patient also has medullary sponge kidney however her kidney function test are all normal.  She is seeing Dr. Carilyn Goodpasture.  She has history of migraine headaches.  Family History; Patient endorses Sr. has diagnosed initially with bipolar disorder.  She was admitted at [facility] however now she is taking medication for ADD.  Education and Work History; Patient is enrolled in [employer] and doing online courses.  She  wants to do early childhood development.  She is currently not working.    Psychosocial History; Patient was born and raised in [place].  She will with her mother sister and stepfather in 2001 after her stepfather accepted the job in this area.  Patient has no contact with his mother the father.  Patient does not get along with her sister who is younger than her.  Legal  History; Denies  History Of Abuse; Denies  Substance Abuse History; Denies  Past Psychiatric History/Hospitalization(s) Patient endorsed history of depression and anxiety symptoms after the first child born.  She has symptoms of ADD however she has no problem in school.  She always get better grades.  She was never tested for ADHD.  5 years ago she was seen by a psychiatrist for depression however she did not like the psychiatrist and never seek treatment again until recently her neurologist recommended to see psychiatrist.  Patient endorsed history of rituals, obsessions and compulsions.  She endorsed history of anxiety nervousness and trust issue.  She denies any history of mania, psychosis, paranoia or any hallucination.  She denies any history of suicidal attempt, self abusive behavior and aggression.  She is never admitted to inpatient psychiatric treatment. Anxiety: Yes Bipolar Disorder: No Depression: Yes Mania: No Psychosis: No Schizophrenia: No Personality Disorder: No Hospitalization for psychiatric illness: No History of Electroconvulsive Shock Therapy: No Prior Suicide Attempts: No   Review of Systems: Psychiatric: Agitation: No Hallucination: No Depressed Mood: Yes Insomnia: Yes Hypersomnia: No Altered Concentration: No Feels Worthless: No Grandiose Ideas: No Belief In Special Powers: No New/Increased Substance Abuse: No Compulsions: Yes  Neurologic: Headache: Yes Seizure: Yes Paresthesias: No    Outpatient Encounter Prescriptions as of 06/26/2013  Medication Sig Dispense Refill  . acetaminophen (TYLENOL) 500 MG tablet Take 500 mg by mouth every 6 (six) hours as needed for pain.      Marland Kitchen LAMICTAL XR 100 MG TB24 Take 1 tablet (100 mg total) by mouth daily.  90 tablet  3  . LamoTRIgine (LAMICTAL XR) 50 MG TB24 Take 1 tablet (50 mg total) by mouth 1 day or 1 dose.  90 tablet  3  . Norethindrone-Ethinyl Estradiol-Fe (GENERESS FE) 0.8-25 MG-MCG tablet Chew 1  tablet by mouth daily.      . clonazePAM (KLONOPIN) 0.5 MG tablet Take 1/2-1 tab at bed time and during the day as needed for anxiety  45 tablet  0  . FLUoxetine (PROZAC) 10 MG capsule Take 1 capsule for 1 week and than 2 daily  60 capsule  0   No facility-administered encounter medications on file as of 06/26/2013.    Recent Results (from the past 2160 hour(s))  CBC WITH DIFFERENTIAL     Status: Abnormal   Collection Time    04/12/13  8:45 PM      Result Value Range   WBC 10.8 (*) 4.0 - 10.5 K/uL   RBC 4.45  3.87 - 5.11 MIL/uL   Hemoglobin 12.7  12.0 - 15.0 g/dL   HCT 16.1  09.6 - 04.5 %   MCV 85.2  78.0 - 100.0 fL   MCH 28.5  26.0 - 34.0 pg   MCHC 33.5  30.0 - 36.0 g/dL   RDW 40.9  81.1 - 91.4 %   Platelets 299  150 - 400 K/uL   Neutrophils Relative % 73  43 - 77 %   Neutro Abs 7.8 (*) 1.7 - 7.7 K/uL   Lymphocytes Relative 22  12 -  46 %   Lymphs Abs 2.4  0.7 - 4.0 K/uL   Monocytes Relative 5  3 - 12 %   Monocytes Absolute 0.5  0.1 - 1.0 K/uL   Eosinophils Relative 1  0 - 5 %   Eosinophils Absolute 0.1  0.0 - 0.7 K/uL   Basophils Relative 0  0 - 1 %   Basophils Absolute 0.0  0.0 - 0.1 K/uL  BASIC METABOLIC PANEL     Status: Abnormal   Collection Time    04/12/13  8:45 PM      Result Value Range   Sodium 137  135 - 145 mEq/L   Potassium 3.4 (*) 3.5 - 5.1 mEq/L   Chloride 102  96 - 112 mEq/L   CO2 24  19 - 32 mEq/L   Glucose, Bld 94  70 - 99 mg/dL   BUN 12  6 - 23 mg/dL   Creatinine, Ser 1.61  0.50 - 1.10 mg/dL   Calcium 9.4  8.4 - 09.6 mg/dL   GFR calc non Af Amer >90  >90 mL/min   GFR calc Af Amer >90  >90 mL/min   Comment:            The eGFR has been calculated     using the CKD EPI equation.     This calculation has not been     validated in all clinical     situations.     eGFR's persistently     <90 mL/min signify     possible Chronic Kidney Disease.  URINALYSIS, ROUTINE W REFLEX MICROSCOPIC     Status: Abnormal   Collection Time    04/12/13  9:35 PM       Result Value Range   Color, Urine YELLOW  YELLOW   APPearance CLOUDY (*) CLEAR   Specific Gravity, Urine 1.030  1.005 - 1.030   pH 5.5  5.0 - 8.0   Glucose, UA NEGATIVE  NEGATIVE mg/dL   Hgb urine dipstick NEGATIVE  NEGATIVE   Bilirubin Urine NEGATIVE  NEGATIVE   Ketones, ur NEGATIVE  NEGATIVE mg/dL   Protein, ur NEGATIVE  NEGATIVE mg/dL   Urobilinogen, UA 1.0  0.0 - 1.0 mg/dL   Nitrite NEGATIVE  NEGATIVE   Leukocytes, UA TRACE (*) NEGATIVE  URINE MICROSCOPIC-ADD ON     Status: Abnormal   Collection Time    04/12/13  9:35 PM      Result Value Range   Squamous Epithelial / LPF FEW (*) RARE   WBC, UA 3-6  <3 WBC/hpf   RBC / HPF 0-2  <3 RBC/hpf   Bacteria, UA FEW (*) RARE   Urine-Other MUCOUS PRESENT    URINE CULTURE     Status: None   Collection Time    04/12/13  9:35 PM      Result Value Range   Specimen Description URINE, CLEAN CATCH     Special Requests NONE     Culture  Setup Time 04/13/2013 21:27     Colony Count NO GROWTH     Culture NO GROWTH     Report Status 04/15/2013 FINAL    POCT PREGNANCY, URINE     Status: None   Collection Time    04/12/13  9:42 PM      Result Value Range   Preg Test, Ur NEGATIVE  NEGATIVE   Comment:            THE SENSITIVITY OF THIS     METHODOLOGY IS >24  mIU/mL      Physical Exam: Constitutional:  BP 129/88  Pulse 82  Ht 5\' 2"  (1.575 m)  Wt 189 lb 3.2 oz (85.821 kg)  BMI 34.6 kg/m2  Musculoskeletal: Strength & Muscle Tone: within normal limits Gait & Station: normal Patient leans: N/A  Mental Status Examination;  Patient is a young female who is overweight, casually dressed and groomed.  She appears very anxious tremulous and tense.  She keep tapping her leg during conversation.  Her speech is fast but clear and coherent.  Her thought processes logical and goal-directed.  She described her mood as very anxious and nervous and her affect is mood appropriate.  She denies any active or passive suicidal thoughts or homicidal  thoughts she denies any auditory or visual hallucination.  There were no delusions however she admitted having trust issues.  She endorse rituals and obsessive thoughts about numbers and counting.  Her attention and concentration is fair.  She gets easily distracted.  She is alert and oriented x3.  Her insight judgment and impulse control is okay.   Medical Decision Making (Choose Three): New problem, with additional work up planned, Review of Psycho-Social Stressors (1), Review or order clinical lab tests (1), Decision to obtain old records (1), Review of Medication Regimen & Side Effects (2) and Review of New Medication or Change in Dosage (2)  Assessment: Axis I: Obsessive-compulsive disorder, anxiety disorder NOS, ADHD  Axis II: Deferred  Axis III:  Past Medical History  Diagnosis Date  . Medullary sponge kidney   . Absence of menstruation 10/27/2009  . ASTHMA NOS W/ACUTE EXACERBATION 01/05/2010  . MIGRAINES, HX OF 06/24/2007  . PYELONEPHRITIS 06/24/2007  . Seizures   . Gallstones   . IBS (irritable bowel syndrome)   . OCD (obsessive compulsive disorder)     Axis IV: Moderate   Plan:  I review her symptoms, psychosocial stressors, blood results and current medication.  She has never taken any antidepressant or anxiolytic medication in the past.  Recommend to try Prozac 10 mg daily for one week and gradually increased to 20 mg.  I will also start Klonopin 0.5 mg half to one tablet at bedtime and also today for extreme anxiety and nervousness.  I also recommend to try Lamictal 200 mg which she is taking for seizure but also helpful for her mood and depression.  Patient has not had any side effects including any rash or itching to the Lamictal.  Recommend to see therapist for coping and social skills.  Discussed in detail the risks and benefits of medication especially metabolic side effects, benzodiazepine dependence, withdrawal and abuse.  Recommend to call us back if she has any question  or any concern.  Followup in 2 weeks.  We'll schedule appointment with Boneta Lucks for counseling.  Time spent 55 minutes. More than 50% of the time spent in psychoeducation, counseling and coordination of care.  Discuss safety plan that anytime having active suicidal thoughts or homicidal thoughts then patient need to call 911 or go to the local emergency room.    [name] T., MD 06/26/2013

## 2013-07-01 NOTE — Telephone Encounter (Signed)
Called patient informed message fwd to Dr. Algis Downs. Patient agreed.

## 2013-07-01 NOTE — Telephone Encounter (Signed)
No objection from my part-  meds for psychiatric reasons.

## 2013-07-02 ENCOUNTER — Telehealth: Payer: Self-pay | Admitting: Neurology

## 2013-07-02 ENCOUNTER — Other Ambulatory Visit: Payer: Self-pay | Admitting: Neurology

## 2013-07-02 DIAGNOSIS — G40802 Other epilepsy, not intractable, without status epilepticus: Secondary | ICD-10-CM

## 2013-07-02 MED ORDER — LAMICTAL XR 100 MG PO TB24: 1.0000 | ORAL_TABLET | Freq: Every day | ORAL | Status: DC

## 2013-07-02 NOTE — Telephone Encounter (Signed)
Spoke to patient and her psychiatrist would like her to take Lamictal XR 200mg  a day.  I believe he wants her neurologist to prescribe this, so he does not have to put her on another antidepressant.

## 2013-07-14 ENCOUNTER — Ambulatory Visit (INDEPENDENT_AMBULATORY_CARE_PROVIDER_SITE_OTHER): Payer: Medicaid Other | Admitting: Psychiatry

## 2013-07-14 ENCOUNTER — Encounter (HOSPITAL_COMMUNITY): Payer: Self-pay | Admitting: Psychiatry

## 2013-07-14 VITALS — BP 126/78 | HR 75 | Ht 62.0 in | Wt 189.0 lb

## 2013-07-14 DIAGNOSIS — F411 Generalized anxiety disorder: Secondary | ICD-10-CM

## 2013-07-14 DIAGNOSIS — F909 Attention-deficit hyperactivity disorder, unspecified type: Secondary | ICD-10-CM

## 2013-07-14 DIAGNOSIS — F429 Obsessive-compulsive disorder, unspecified: Secondary | ICD-10-CM

## 2013-07-14 MED ORDER — CLONAZEPAM 0.5 MG PO TABS: ORAL_TABLET | ORAL | Status: DC

## 2013-07-14 MED ORDER — FLUOXETINE HCL 10 MG PO CAPS: ORAL_CAPSULE | ORAL | Status: DC

## 2013-07-14 NOTE — Progress Notes (Signed)
[facility] [id number] Progress Note  Matteson Blue [id number] 27 y.o.  07/14/2013 1:35 PM  Chief Complaint:  Followup, anxiety and medication refill.      History of Present Illness:  Patient is 27 year old unemployed Caucasian female who is seen first time on October 2 for anxiety depression and OCD symptoms.  She was started on Prozac 10 mg and gradually increased to 20 mg daily she is also started on Klonopin for insomnia and anxiety.  Patient seen some improvement in her sleep, socialization, crying spells and irritability however she continues to have anxiety and nervousness.  She continued to assess about certain numbers .  She denied any side effects of medication.  She lacks Klonopin however she is taking only half tablet because it is making her very sleepy and groggy the next day.  She continues to have panic attack to 3 times .  Patient endorsed not much improvement in obsession .  She continues to have difficulty organizing her thoughts, focus and attention.  She takes a lot of time to organizing her candles, utensils and other belongings.  She fears from height.  She's not comfortable around people however since started Prozac she started to socialize and taking her children outside.  She was recommended to try increased dose of Lamictal, she consulted her neurologist and the mental dose has been increased.  She's taking Lamictal 200 mg.  She's not complaining of any itching rash or tremors.  She's waiting for a counseling appointment.  She is not drinking or using any illegal substances.  Her appetite is unchanged from the past  Suicidal Ideation: No Plan Formed: No Patient has means to carry out plan: No  Homicidal Ideation: No Plan Formed: No Patient has means to carry out plan: No  Past Psychiatric History/Hospitalization(s) Patient endorsed history of depression and anxiety symptoms after the first child born.  She has symptoms of ADD however she has no problem in  school.  She always get better grades.  She was never tested for ADHD.  5 years ago she was seen by a psychiatrist for depression however she did not like the psychiatrist and never seek treatment again until recently her neurologist recommended to see psychiatrist.  Patient endorsed history of rituals, obsessions and compulsions.  She endorsed history of anxiety nervousness and trust issue.  She denies any history of mania, psychosis, paranoia or any hallucination.  She denies any history of suicidal attempt, self abusive behavior and aggression.  She is never admitted to inpatient psychiatric treatment. Anxiety: Yes Bipolar Disorder: No Depression: Yes Mania: No Psychosis: No Schizophrenia: No Personality Disorder: No Hospitalization for psychiatric illness: No History of Electroconvulsive Shock Therapy: No Prior Suicide Attempts: No  Medical History; Patient has seizure disorder.  She started having grand mal seizures at early age.  She denies any history of head trauma.  Patient also has medullary sponge kidney however her kidney function test are all normal.  She is seeing Dr. Donell Beers.  She has history of migraine headaches.  Family History; Patient endorses Sr. has diagnosed initially with bipolar disorder.  She was admitted at [facility] however now she is taking medication for ADD.  Education and Work History; Patient is enrolled in [employer] and doing online courses.  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  She is currently not working.    Psychosocial History; Patient was born and raised in [place].  She will with her mother sister and stepfather in 2001 after her  stepfather accepted the job in this area.  Patient has no contact with his mother the father.  Patient does not get along with her sister who is younger than her.  History Of Abuse; Denies  Substance Abuse History; Denies   Review of Systems: Psychiatric: Agitation:  No Hallucination: No Depressed Mood: Yes Insomnia: No Hypersomnia: No Altered Concentration: No Feels Worthless: No Grandiose Ideas: No Belief In Special Powers: No New/Increased Substance Abuse: No Compulsions: Yes  Neurologic: Headache: Yes Seizure: Yes Paresthesias: No    Outpatient Encounter Prescriptions as of 07/14/2013  Medication Sig Dispense Refill  . clonazePAM (KLONOPIN) 0.5 MG tablet Take 1/2-1 tab at bed time as needed for insomnia and anxiety  30 tablet  0  . FLUoxetine (PROZAC) 10 MG capsule Take 3 capsule daily  90 capsule  0  . LamoTRIgine XR (LAMICTAL XR) 200 MG TB24 Take 200 mg by mouth.      . Norethindrone-Ethinyl Estradiol-Fe (GENERESS FE) 0.8-25 MG-MCG tablet Chew 1 tablet by mouth daily.      . [DISCONTINUED] clonazePAM (KLONOPIN) 0.5 MG tablet Take 1/2-1 tab at bed time and during the day as needed for anxiety  45 tablet  0  . [DISCONTINUED] FLUoxetine (PROZAC) 10 MG capsule Take 1 capsule for 1 week and than 2 daily  60 capsule  0  . acetaminophen (TYLENOL) 500 MG tablet Take 500 mg by mouth every 6 (six) hours as needed for pain.      . [DISCONTINUED] LAMICTAL XR 100 MG TB24 Take 1 tablet (100 mg total) by mouth daily.  180 tablet  3   No facility-administered encounter medications on file as of 07/14/2013.    No results found for this or any previous visit (from the past 2160 hour(s)).    Physical Exam: Constitutional:  BP 126/78  Pulse 75  Ht 5\' 2"  (1.575 m)  Wt 189 lb (85.73 kg)  BMI 34.56 kg/m2  Musculoskeletal: Strength & Muscle Tone: within normal limits Gait & Station: normal Patient leans: N/A  Mental Status Examination;  Patient is a young female who is overweight, casually dressed and groomed.  She appears anxious but cooperative.  Her speech is fast but clear and coherent.  Her thought processes logical and goal-directed.  She described her mood anxious and her affect is constricted.  She denies any active or passive suicidal  thoughts or homicidal thoughts she denies any auditory or visual hallucination.  There were no delusions however she admitted having trust issues.  She endorse rituals and obsessive thoughts about numbers and counting.  Her attention and concentration is fair.  She gets easily distracted.  She is alert and oriented x3.  Her insight judgment and impulse control is okay.   Medical Decision Making (Choose Three): Established Problem, Stable/Improving (1), Review of Psycho-Social Stressors (1), Review of Last Therapy Session (1), Review of Medication Regimen & Side Effects (2) and Review of New Medication or Change in Dosage (2)  Assessment: Axis I: Obsessive-compulsive disorder, anxiety disorder NOS, ADHD  Axis II: Deferred  Axis III:  Past Medical History  Diagnosis Date  . Medullary sponge kidney   . Absence of menstruation 10/27/2009  . ASTHMA NOS W/ACUTE EXACERBATION 01/05/2010  . MIGRAINES, HX OF 06/24/2007  . PYELONEPHRITIS 06/24/2007  . Seizures   . Gallstones   . IBS (irritable bowel syndrome)   . OCD (obsessive compulsive disorder)     Axis IV: Moderate   Plan:  Recommend to try Prozac 30 mg daily and continue  Klonopin 0.5 mg half to one tablet at bedtime for insomnia and anxiety.  Her Lamictal was increased recently.  She is tolerating her medication without any side effects other than sedation in the morning.  I will schedule appointment with Boneta Lucks for counseling.  Recommend to call us back if she is a question of a concern.  Followup in 4 weeks.  Time spent 25 minutes.  More than 50% of the time spent in psychoeducation, counseling and coordination of care.  Discuss safety plan that anytime having active suicidal thoughts or homicidal thoughts then patient need to call 911 or go to the local emergency room.  ARFEEN,SYED T., MD 07/14/2013

## 2013-07-21 ENCOUNTER — Encounter (HOSPITAL_COMMUNITY): Payer: Self-pay | Admitting: Emergency Medicine

## 2013-07-21 ENCOUNTER — Emergency Department (HOSPITAL_COMMUNITY): Payer: Medicaid Other

## 2013-07-21 ENCOUNTER — Emergency Department (HOSPITAL_COMMUNITY)
Admission: EM | Admit: 2013-07-21 | Discharge: 2013-07-21 | Disposition: A | Discharge status: Home / Self Care | Payer: Medicaid Other | Attending: Emergency Medicine | Admitting: Emergency Medicine

## 2013-07-21 DIAGNOSIS — G40909 Epilepsy, unspecified, not intractable, without status epilepticus: Secondary | ICD-10-CM | POA: Insufficient documentation

## 2013-07-21 DIAGNOSIS — F429 Obsessive-compulsive disorder, unspecified: Secondary | ICD-10-CM | POA: Insufficient documentation

## 2013-07-21 DIAGNOSIS — F411 Generalized anxiety disorder: Secondary | ICD-10-CM | POA: Insufficient documentation

## 2013-07-21 DIAGNOSIS — S6990XA Unspecified injury of unspecified wrist, hand and finger(s), initial encounter: Secondary | ICD-10-CM | POA: Insufficient documentation

## 2013-07-21 DIAGNOSIS — Y939 Activity, unspecified: Secondary | ICD-10-CM | POA: Insufficient documentation

## 2013-07-21 DIAGNOSIS — G43909 Migraine, unspecified, not intractable, without status migrainosus: Secondary | ICD-10-CM | POA: Insufficient documentation

## 2013-07-21 DIAGNOSIS — Q615 Medullary cystic kidney: Secondary | ICD-10-CM | POA: Insufficient documentation

## 2013-07-21 DIAGNOSIS — Z8719 Personal history of other diseases of the digestive system: Secondary | ICD-10-CM | POA: Insufficient documentation

## 2013-07-21 DIAGNOSIS — Z8742 Personal history of other diseases of the female genital tract: Secondary | ICD-10-CM | POA: Insufficient documentation

## 2013-07-21 DIAGNOSIS — W1809XA Striking against other object with subsequent fall, initial encounter: Secondary | ICD-10-CM | POA: Insufficient documentation

## 2013-07-21 DIAGNOSIS — S59909A Unspecified injury of unspecified elbow, initial encounter: Secondary | ICD-10-CM | POA: Insufficient documentation

## 2013-07-21 DIAGNOSIS — J45909 Unspecified asthma, uncomplicated: Secondary | ICD-10-CM | POA: Insufficient documentation

## 2013-07-21 DIAGNOSIS — Z79899 Other long term (current) drug therapy: Secondary | ICD-10-CM | POA: Insufficient documentation

## 2013-07-21 DIAGNOSIS — M25531 Pain in right wrist: Secondary | ICD-10-CM

## 2013-07-21 DIAGNOSIS — Z87448 Personal history of other diseases of urinary system: Secondary | ICD-10-CM | POA: Insufficient documentation

## 2013-07-21 DIAGNOSIS — Y929 Unspecified place or not applicable: Secondary | ICD-10-CM | POA: Insufficient documentation

## 2013-07-21 HISTORY — DX: Anxiety disorder, unspecified: F41.9

## 2013-07-21 MED ORDER — HYDROCODONE-ACETAMINOPHEN 5-325 MG PO TABS: 1.0000 | ORAL_TABLET | Freq: Four times a day (QID) | ORAL | Status: DC | PRN

## 2013-07-21 NOTE — ED Notes (Signed)
Patient transported to X-ray 

## 2013-07-21 NOTE — ED Provider Notes (Signed)
CSN: [id number]     Arrival date & time 07/21/13  1158 History  This chart was scribed for non-physician practitioner Santiago Glad, PA-C working with Junius Argyle, MD by Valera Castle, ED scribe. This patient was seen in room WTR7/WTR7 and the patient's care was started at 1:20 PM.    Chief Complaint  Patient presents with  . Arm Pain    right   The history is provided by the patient. No language interpreter was used.   HPI Comments: [name] is a 27 y.o. female who presents to the Emergency Department complaining of sudden, moderate, constant right wrist pain, that radiates to her elbow, onset 2 days ago when she fell and hit her wrist on a metal rod. She reports pain with movement of her right hand, thumb, and fingers, along with some mild swelling and discoloration yesterday. She also reports current numbness from her wrist to her elbow. She reports being right hand dominate. She reports taking Tylenol, with no relief. She denies any h/o injury to the area. She denies any tingling, and any other associated symptoms. She denies any pertinent medical history.   Past Medical History  Diagnosis Date  . Medullary sponge kidney   . Absence of menstruation 10/27/2009  . ASTHMA NOS W/ACUTE EXACERBATION 01/05/2010  . MIGRAINES, HX OF 06/24/2007  . PYELONEPHRITIS 06/24/2007  . Seizures   . Gallstones   . IBS (irritable bowel syndrome)   . OCD (obsessive compulsive disorder)   . Anxiety    Past Surgical History  Procedure Laterality Date  . Back surgery    . Lumbar laminectomy/decompression microdiscectomy  07/24/2012    Procedure: LUMBAR LAMINECTOMY/DECOMPRESSION MICRODISCECTOMY;  Surgeon: Jacki Cones, MD;  Location: [facility];  Service: Orthopedics;  Laterality: Right;  L5-S1   Family History  Problem Relation Age of Onset  . Anesthesia problems Neg Hx   . Heart disease Mother    History  Substance Use Topics  . Smoking status: Never Smoker   . Smokeless tobacco: Never  Used  . Alcohol Use: No     Comment: occas.   OB History   Grav Para Term Preterm Abortions TAB SAB Ect Mult Living   2 2 2  0 0 0 0 0 0 2     Review of Systems  Musculoskeletal: Positive for arthralgias (right wrist pain).  All other systems reviewed and are negative.    Allergies  Other; Ciprofloxacin; Nsaids; Topiramate; and Triptans  Home Medications   Current Outpatient Rx  Name  Route  Sig  Dispense  Refill  . clonazePAM (KLONOPIN) 0.5 MG tablet      Take 1/2-1 tab at bed time as needed for insomnia and anxiety   30 tablet   0   . FLUoxetine (PROZAC) 10 MG capsule      Take 3 capsule daily   90 capsule   0     Dose increase   . HYDROcodone-acetaminophen (NORCO/VICODIN) 5-325 MG per tablet   Oral   Take 1 tablet by mouth every 6 (six) hours as needed for pain.         . LamoTRIgine XR (LAMICTAL XR) 200 MG TB24   Oral   Take 200 mg by mouth.         . Norethindrone-Ethinyl Estradiol-Fe (GENERESS FE) 0.8-25 MG-MCG tablet   Oral   Chew 1 tablet by mouth daily.          Triage Vitals: BP 137/96  Pulse 82  Temp(Src) 98.3  F (36.8 C) (Oral)  Resp 18  SpO2 100%  Physical Exam  Nursing note and vitals reviewed. Constitutional: She is oriented to person, place, and time. She appears well-developed and well-nourished. No distress.  HENT:  Head: Normocephalic and atraumatic.  Eyes: EOM are normal.  Neck: Neck supple. No tracheal deviation present.  Cardiovascular: Normal rate, regular rhythm and normal heart sounds.   2+ DP intact.  Pulmonary/Chest: Effort normal and breath sounds normal. No respiratory distress.  Musculoskeletal: Normal range of motion.  Ttp over radial aspect of right wrist and forearm. Pain with ROM.  Mild swelling of the radial aspect of the right wrist.  No obvious bruising or erythema.  Neurological: She is alert and oriented to person, place, and time.  Distal sensation of all digits in right hand intact.   Skin: Skin is  warm and dry.  Psychiatric: She has a normal mood and affect. Her behavior is normal.    ED Course  Procedures (including critical care time)  DIAGNOSTIC STUDIES: Oxygen Saturation is 100% on room air, normal by my interpretation.    COORDINATION OF CARE: 1:23 PM-Discussed treatment plan which includes right wrist and hand xray with pt at bedside and pt agreed to plan.   Labs Review Labs Reviewed - No data to display Imaging Review Dg Wrist Complete Right  07/21/2013   CLINICAL DATA:  Wrist pain. Fell.  EXAM: RIGHT WRIST - COMPLETE 3+ VIEW  COMPARISON:  None  FINDINGS: The joint spaces are maintained. No acute fracture or degenerative changes.  IMPRESSION: No acute bony findings.   Electronically Signed   By: Loralie Champagne M.D.   On: 07/21/2013 13:49   Dg Hand Complete Right  07/21/2013   CLINICAL DATA:  Wrist pain and swelling. Fell 2 days ago.  EXAM: RIGHT HAND - COMPLETE 3+ VIEW  COMPARISON:  07/20/2013.  FINDINGS: The joint spaces are maintained. No acute fracture.  IMPRESSION: No acute bony findings.   Electronically Signed   By: Loralie Champagne M.D.   On: 07/21/2013 13:48    EKG Interpretation   None      Meds ordered this encounter  Medications  . HYDROcodone-acetaminophen (NORCO/VICODIN) 5-325 MG per tablet    Sig: Take 1 tablet by mouth every 6 (six) hours as needed for pain.     MDM  No diagnosis found. Patient presenting with pain of the right wrist after hitting it on a metal rod two days ago.  No obvious deformity.  Xray negative.  Patient neurovascularly intact.  Patient given wrist splint.  RICE instructions given.  Patient stable for discharge.  I personally performed the services described in this documentation, which was scribed in my presence. The recorded information has been reviewed and is accurate.    Santiago Glad, PA-C 07/21/13 1438

## 2013-07-21 NOTE — ED Provider Notes (Signed)
Medical screening examination/treatment/procedure(s) were performed by non-physician practitioner and as supervising physician I was immediately available for consultation/collaboration.    Junius Argyle, MD 07/21/13 [phone]

## 2013-07-21 NOTE — ED Notes (Signed)
PT states she fell on Saturday, c/o right arm pain that radiates to elbow. States it hurts to move thumb and other fingers on right hand.

## 2013-07-23 ENCOUNTER — Other Ambulatory Visit: Payer: Self-pay | Admitting: Neurology

## 2013-07-29 ENCOUNTER — Ambulatory Visit (INDEPENDENT_AMBULATORY_CARE_PROVIDER_SITE_OTHER): Payer: Medicaid Other | Admitting: Psychology

## 2013-07-29 DIAGNOSIS — F429 Obsessive-compulsive disorder, unspecified: Secondary | ICD-10-CM

## 2013-07-31 ENCOUNTER — Encounter (HOSPITAL_COMMUNITY): Payer: Self-pay | Admitting: Psychology

## 2013-07-31 ENCOUNTER — Other Ambulatory Visit: Payer: Self-pay

## 2013-07-31 NOTE — Progress Notes (Signed)
Patient:   [name]   DOB:   [birth year]  MR Number:  [id number]  Location:  [facility] [place] [address] [id number] [place] [address] Dept: [phone]           Date of Service:   07/29/13  Start Time:   2.40pm End Time:   3:50pm  Provider/Observer:  Forde Radon [facility]       Billing Code/Service: [phone]  Chief Complaint:     Chief Complaint  Patient presents with  . Depression  . OCD    Reason for Service:  Pt is referred for counseling to assist in coping w/ OCD, anxiety and depressed moods.  Pt has obsession, compulsion and rituals about numbers and organization.  Pt also significant worries about social interactions, being around others and in public.  Pt reports periods of depressed moods as well.  Pt also endorsed hx of poor focus and inattention- feels ADD.  Pt has been dx and begun tx w/ Dr. Lolly Mustache for OCD.  Pt reports hx of depression following births of both children.   Current Status:  Pt reports improved with ability to leave her house and getting out more.  Pt also reports current medication is helping her sleep and she is able to get more around the house- feeling accomplished. Pt does reports issues w/ trust- recent in relationship as fiance as she discovered inappropriate relationship with one of customers.  Pt reports this has improved as he is being upfront and allowing her to track him through GPS.    Reliability of Information: Pt provided information and [facility] outpt records reviewed.  Behavioral Observation: [name]  presents as a 27 y.o.-year-old  Caucasian Female who appeared her stated age. her dress was Appropriate and she was Casual and Well Groomed and her manners were Appropriate to the situation.  There were not any physical disabilities noted.  she displayed an appropriate level of cooperation and motivation.    Interactions:    Active   Attention:   within normal  limits  Memory:   within normal limits  Visuo-spatial:   not examined  Speech (Volume):  normal  Speech:   normal pitch and normal volume  Thought Process:  Coherent and Relevant  Though Content:  WNL  Orientation:   person, place, time/date and situation  Judgment:   Good  Planning:   Good  Affect:    Appropriate  Mood:    Anxious  Insight:   Good  Intelligence:   normal  Marital Status/Living: Pt lives with her fiance and 2 children 5y/o [place] and 2y/o [place].  Pt reports that her son is very hyperactive and is being evaluated for ADHD. Pt has been with her fiance for 6 years.  Pt grew up with her mother and adoptive father (previous stepfather) along with her younger sister Alcario Drought who is now 87 years old.  Pt reports no contact with her bio dad.  Pt reports that her household growing up was very strict- unable to socialize.  Pt reports conflict w/ sister as very irresponsible and disagrees with her lifestyle leading.  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  Substance Use:  No concerns of substance abuse are reported.    Education:  HS Graduate.  Pt currently working towards associate degree in early childhood education.   Medical History:   Past Medical History  Diagnosis Date  . Medullary sponge kidney   . Absence of menstruation 10/27/2009  . ASTHMA NOS W/ACUTE EXACERBATION 01/05/2010  . MIGRAINES, HX OF 06/24/2007  . PYELONEPHRITIS 06/24/2007  . Seizures   . Gallstones   . IBS (irritable bowel syndrome)   . OCD (obsessive compulsive disorder)   . Anxiety   . Degenerative disc disease         Outpatient Encounter Prescriptions as of 07/29/2013  Medication Sig  . clonazePAM (KLONOPIN) 0.5 MG tablet Take 1/2-1 tab at bed time as needed for insomnia  and anxiety  . FLUoxetine (PROZAC) 10 MG capsule Take 3 capsule daily  . LamoTRIgine XR (LAMICTAL XR) 200 MG TB24 Take 200 mg by mouth.  . Norethindrone-Ethinyl Estradiol-Fe (GENERESS FE) 0.8-25 MG-MCG tablet Chew 1 tablet by mouth daily.  Marland Kitchen HYDROcodone-acetaminophen (NORCO/VICODIN) 5-325 MG per tablet Take 1 tablet by mouth every 6 (six) hours as needed for pain.  Marland Kitchen HYDROcodone-acetaminophen (NORCO/VICODIN) 5-325 MG per tablet Take 1-2 tablets by mouth every 6 (six) hours as needed for pain.        Pt reports taking meds as prescribed.  Sexual History:   History  Sexual Activity  . Sexual Activity: Yes  . Partners: Male  . Birth Control/ Protection: None    Abuse/Trauma History: Pt reports no hx of abuse.  Psychiatric History:  Pt reports began family counseling with Dr. Arita Miss when a teenager.  Pt reports stopped counseling when started at [facility].   Family Med/Psych History:  Family History  Problem Relation Age of Onset  . Anesthesia problems Neg Hx   . Heart disease Mother   . ADD / ADHD Sister     Risk of Suicide/Violence: virtually non-existent Pt no hx of SI or suicide attempt and no hx of aggression.  Impression/DX:  Pt is a 27y/o female who is seeking counseling to assist w/ OCD and anxiety.  Pt has seen some improvement in symptoms since starting on medication and is aware needs to build coping skills to assist in managing OCD, anxiety and depressed moods. No hx of SA, SI.  Pt good insight.   Disposition/Plan:  F/u in 1-2 weeks for counseling.  Diagnosis:     OCD (obsessive compulsive disorder)

## 2013-08-06 ENCOUNTER — Ambulatory Visit (HOSPITAL_COMMUNITY): Payer: Self-pay | Admitting: Psychology

## 2013-08-11 ENCOUNTER — Ambulatory Visit (INDEPENDENT_AMBULATORY_CARE_PROVIDER_SITE_OTHER): Payer: Medicaid Other | Admitting: Psychiatry

## 2013-08-11 ENCOUNTER — Encounter (HOSPITAL_COMMUNITY): Payer: Self-pay | Admitting: Psychiatry

## 2013-08-11 ENCOUNTER — Telehealth: Payer: Self-pay | Admitting: Neurology

## 2013-08-11 VITALS — BP 132/87 | HR 77 | Wt 187.0 lb

## 2013-08-11 DIAGNOSIS — F909 Attention-deficit hyperactivity disorder, unspecified type: Secondary | ICD-10-CM

## 2013-08-11 DIAGNOSIS — F429 Obsessive-compulsive disorder, unspecified: Secondary | ICD-10-CM

## 2013-08-11 DIAGNOSIS — F411 Generalized anxiety disorder: Secondary | ICD-10-CM

## 2013-08-11 MED ORDER — CLONAZEPAM 1 MG PO TABS: ORAL_TABLET | ORAL | Status: DC

## 2013-08-11 MED ORDER — FLUOXETINE HCL 40 MG PO CAPS: ORAL_CAPSULE | ORAL | Status: DC

## 2013-08-11 NOTE — Progress Notes (Signed)
[facility] [address] Progress Note  [name] [id number] 27 y.o.  08/11/2013 10:15 AM  Chief Complaint:  I still have a lot of OCD symptoms but I'm sleeping better.        History of Present Illness:  [name] gave for her followup appointment.  On her last visit we increased her Prozac to 30 mg.  We continued Klonopin 0.5 mg at bedtime.  However patient started to take higher dose of Klonopin because she was unable to sleep at 0.5 mg.  She is taking 1 mg.  She sleeping better.  She continues to have OCD symptoms.  She has rituals, numbers and images before falling asleep.  She has one panic attack since the last visit.  She also admitted at least one crying spells but overall her depression is better.  Her attention and focus remains unchanged from the past.  She has difficulty organizing her thoughts.  She still requires a lot of time to organize her things.  She stresses about her children.  Her son has ADD .  She seeing therapist Dicky Doe in this office.  She is taking Lamictal which is prescribed by her neurologist for seizures .  She has no tremors or shakes.  She is not using any illegal substance.  Her appetite and weight is unchanged from the past.  Suicidal Ideation: No Plan Formed: No Patient has means to carry out plan: No  Homicidal Ideation: No Plan Formed: No Patient has means to carry out plan: No  Past Psychiatric History/Hospitalization(s) Patient endorsed history of depression and anxiety symptoms after the first child born.  She has symptoms of ADD however she has no problem in school.  She always get better grades.  She was never tested for ADHD.  Few years ago she was seen by a psychiatrist for depression however she did not like the psychiatrist and never seek treatment again until recently her neurologist recommended to see psychiatrist.  Patient endorsed history of rituals, obsessions and compulsions.  She endorsed history of anxiety nervousness and trust  issue.  She denies any history of mania, psychosis, paranoia or any hallucination.  She denies any history of suicidal attempt, self abusive behavior and aggression.  She is never admitted to inpatient psychiatric treatment. Anxiety: Yes Bipolar Disorder: No Depression: Yes Mania: No Psychosis: No Schizophrenia: No Personality Disorder: No Hospitalization for psychiatric illness: No History of Electroconvulsive Shock Therapy: No Prior Suicide Attempts: No  Medical History; Patient has seizure disorder.  She started having grand mal seizures at early age.  She denies any history of head trauma.  Patient also has medullary sponge kidney however her kidney function test are all normal.  She is seeing Dr. Donell Beers.  She has history of migraine headaches.  Family History; Patient endorses Sr. has diagnosed initially with bipolar disorder.  She was admitted at [facility] however now she is taking medication for ADD.  Education and Work History; Patient is enrolled in [employer] and doing online courses.  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  She is currently not working.    Psychosocial History; Patient was born and raised in [place].  She will with her mother sister and stepfather in 2001 after her stepfather accepted the job in this area.  Patient has no contact with his mother the father.  Patient does not get along with her sister who is younger than her.  History Of Abuse; Denies  Substance Abuse History; Denies   Review  of Systems: Psychiatric: Agitation: No Hallucination: No Depressed Mood: Yes Insomnia: No Hypersomnia: No Altered Concentration: No Feels Worthless: No Grandiose Ideas: No Belief In Special Powers: No New/Increased Substance Abuse: No Compulsions: Yes  Neurologic: Headache: Yes Seizure: Yes Paresthesias: No    Outpatient Encounter Prescriptions as of 08/11/2013  Medication Sig  . clonazePAM (KLONOPIN) 1 MG  tablet Take 1 tab at bed time  . FLUoxetine (PROZAC) 40 MG capsule Take 1 capsule daily  . LamoTRIgine XR (LAMICTAL XR) 200 MG TB24 Take 200 mg by mouth.  . Norethindrone-Ethinyl Estradiol-Fe (GENERESS FE) 0.8-25 MG-MCG tablet Chew 1 tablet by mouth daily.  . [DISCONTINUED] clonazePAM (KLONOPIN) 0.5 MG tablet Take 1/2-1 tab at bed time as needed for insomnia and anxiety  . [DISCONTINUED] FLUoxetine (PROZAC) 10 MG capsule Take 3 capsule daily  . [DISCONTINUED] HYDROcodone-acetaminophen (NORCO/VICODIN) 5-325 MG per tablet Take 1 tablet by mouth every 6 (six) hours as needed for pain.  . [DISCONTINUED] HYDROcodone-acetaminophen (NORCO/VICODIN) 5-325 MG per tablet Take 1-2 tablets by mouth every 6 (six) hours as needed for pain.    No results found for this or any previous visit (from the past 2160 hour(s)).    Physical Exam: Constitutional:  BP 132/87  Pulse 77  Wt 187 lb (84.823 kg)  LMP 07/18/2013  Musculoskeletal: Strength & Muscle Tone: within normal limits Gait & Station: normal Patient leans: N/A  Mental Status Examination;  Patient is a young female who is overweight, casually dressed and groomed.  She appears anxious but cooperative.  Her speech is fast but clear and coherent.  Her thought processes logical and goal-directed.  She described her mood anxious and her affect is constricted.  She denies any active or passive suicidal thoughts or homicidal thoughts she denies any auditory or visual hallucination.  There were no delusions however she admitted having trust issues.  She endorse rituals and obsessive thoughts about numbers and counting.  Her attention and concentration is fair.  She gets easily distracted.  She is alert and oriented x3.  Her insight judgment and impulse control is okay.   Medical Decision Making (Choose Three): Established Problem, Stable/Improving (1), Review of Psycho-Social Stressors (1), Review of Last Therapy Session (1), Review of Medication Regimen &  Side Effects (2) and Review of New Medication or Change in Dosage (2)  Assessment: Axis I: Obsessive-compulsive disorder, anxiety disorder NOS, ADHD  Axis II: Deferred  Axis III:  Past Medical History  Diagnosis Date  . Medullary sponge kidney   . Absence of menstruation 10/27/2009  . ASTHMA NOS W/ACUTE EXACERBATION 01/05/2010  . MIGRAINES, HX OF 06/24/2007  . PYELONEPHRITIS 06/24/2007  . Seizures   . Gallstones   . IBS (irritable bowel syndrome)   . OCD (obsessive compulsive disorder)   . Anxiety   . Degenerative disc disease     Axis IV: Moderate   Plan:  I will increase her Prozac to 40 mg.  We also discussed not to increase her benzodiazepine without permission but she acknowledged.  I will increase Klonopin to 1 mg since she is feeling much better however in the future we will not allow her to increase the Klonopin without informing us.  Discusses in detail the risks and benefits of medication.  If patient does not see any improvement with increase Prozac we will consider switching to either fluoxmine or Anafranil.  Recommend gauze packages a question of a concern.  Followup in 4 weeks. Time spent 25 minutes.  More than 50% of the time  spent in psychoeducation, counseling and coordination of care.  Discuss safety plan that anytime having active suicidal thoughts or homicidal thoughts then patient need to call 911 or go to the local emergency room.  Theophilus Walz T., MD 08/11/2013

## 2013-08-12 MED ORDER — LAMOTRIGINE ER 200 MG PO TB24: 200.0000 mg | ORAL_TABLET | Freq: Every day | ORAL | Status: DC

## 2013-08-12 NOTE — Telephone Encounter (Signed)
We previously prescribed Lamictal XR 100mg  daily.  Patient's other provider recommended a dose increase to 200mg  XR daily, which Dr Vickey Huger agreed with per phone note in Oct.  Rx has been sent.

## 2013-08-13 ENCOUNTER — Other Ambulatory Visit (HOSPITAL_COMMUNITY): Payer: Self-pay | Admitting: *Deleted

## 2013-08-13 DIAGNOSIS — F429 Obsessive-compulsive disorder, unspecified: Secondary | ICD-10-CM

## 2013-08-13 MED ORDER — FLUOXETINE HCL 40 MG PO CAPS: ORAL_CAPSULE | ORAL | Status: DC

## 2013-08-13 NOTE — Telephone Encounter (Signed)
Per [facility], pt's insurance will not cover Prozac 40 mg. Provider asked that cost of RX  Advised pt that per [facility] pharmacy, Prozac 40 mg on $4 list, without insurance.Pt asked for RX to be sent to [facility], on [address] in [facility].Informed pt this will be done and previous RX sent to [facility] will be cancelled.

## 2013-08-19 ENCOUNTER — Telehealth: Payer: Self-pay

## 2013-08-19 NOTE — Telephone Encounter (Signed)
Prior Auth for Lamictal XR 200 mg has been submitted to [place].

## 2013-08-20 ENCOUNTER — Ambulatory Visit (HOSPITAL_COMMUNITY): Payer: Self-pay | Admitting: Psychology

## 2013-08-26 ENCOUNTER — Telehealth: Payer: Self-pay | Admitting: Neurology

## 2013-08-26 NOTE — Telephone Encounter (Signed)
This was already sent to ins (see previous note) and is pending response.  Outcome was likely delayed by the Thanksgiving Holiday.  [place] closed 11/26 and reopened 12/01.

## 2013-09-02 ENCOUNTER — Telehealth: Payer: Self-pay | Admitting: Neurology

## 2013-09-02 NOTE — Telephone Encounter (Signed)
Patient called stating that she is calling for the third time for prior authorization for her Lamictal script. Please call.

## 2013-09-02 NOTE — Telephone Encounter (Signed)
This was already taken care of.  I called Medicaid and spoke with Molly Maduro.  He verified this prior auth request was already approved effective 08/27/2013-08/27/2014.  He says if the pharmacy is still having issues processing the claim, it is not because a PA is needed, it is something on their end and they would need to call Medicaid for assistance.  Call Ref # I [id number].  I called the patient. She is aware med was approved and will follow up with the pharmacy.  I advised her they may need to contact Medicaid if they still have issues with Rx.  She verbalized understanding.

## 2013-09-03 ENCOUNTER — Ambulatory Visit (HOSPITAL_COMMUNITY): Payer: Self-pay | Admitting: Psychology

## 2013-09-08 ENCOUNTER — Ambulatory Visit (INDEPENDENT_AMBULATORY_CARE_PROVIDER_SITE_OTHER): Payer: Medicaid Other | Admitting: Psychiatry

## 2013-09-08 ENCOUNTER — Other Ambulatory Visit: Payer: Self-pay

## 2013-09-08 ENCOUNTER — Encounter (HOSPITAL_COMMUNITY): Payer: Self-pay | Admitting: Psychiatry

## 2013-09-08 VITALS — BP 137/92 | HR 83 | Ht 61.22 in | Wt 182.4 lb

## 2013-09-08 DIAGNOSIS — F411 Generalized anxiety disorder: Secondary | ICD-10-CM

## 2013-09-08 DIAGNOSIS — F909 Attention-deficit hyperactivity disorder, unspecified type: Secondary | ICD-10-CM

## 2013-09-08 DIAGNOSIS — F429 Obsessive-compulsive disorder, unspecified: Secondary | ICD-10-CM

## 2013-09-08 MED ORDER — FLUOXETINE HCL 40 MG PO CAPS: ORAL_CAPSULE | ORAL | Status: DC

## 2013-09-08 MED ORDER — LAMICTAL XR 200 MG PO TB24: 200.0000 mg | ORAL_TABLET | Freq: Every day | ORAL | Status: DC

## 2013-09-08 MED ORDER — CLONAZEPAM 1 MG PO TABS: ORAL_TABLET | ORAL | Status: DC

## 2013-09-08 NOTE — Progress Notes (Signed)
[facility] [id number] Progress Note  [name] [id number] 27 y.o.  09/08/2013 11:08 AM  Chief Complaint:  Medication management and followup.       History of Present Illness:  [name] for her followup appointment.  She is taking Prozac 40 mg and Klonopin 1 mg at bedtime.  She is less anxious and less depressed.  She is sleeping better.  However she continued to yawns during the day.  She is not sedated or feel dizzy during the day.  Her OCD symptoms are less intense.  She denies any recent panic attack , crying spells or any agitation.  She denied any side effects of medication.  She denies any tremors, shakes or any muscle stiffness.  Her weight and appetite is unchanged from the past.  She is also taking Lamictal which is prescribed by neurologist for her seizure disorder.  She seeing therapist in his office for counseling.  She is enrolled in [employer] and doing online courses.  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  She is currently not working.     Suicidal Ideation: No Plan Formed: No Patient has means to carry out plan: No  Homicidal Ideation: No Plan Formed: No Patient has means to carry out plan: No  Past Psychiatric History/Hospitalization(s) Patient endorsed history of depression and anxiety symptoms after the first child born.  She has symptoms of ADD however she has no problem in school.  She always get better grades.  She was never tested for ADHD.  She also has history of rituals, obsessions and compulsions.  She endorsed history of anxiety nervousness and trust issue.  She denies any history of mania, psychosis, paranoia or any hallucination.  She denies any history of suicidal attempt, self abusive behavior and aggression.   Anxiety: Yes Bipolar Disorder: No Depression: Yes Mania: No Psychosis: No Schizophrenia: No Personality Disorder: No Hospitalization for psychiatric illness: No History of Electroconvulsive Shock Therapy:  No Prior Suicide Attempts: No  Medical History; Patient has seizure disorder.  She started having grand mal seizures at early age.  She denies any history of head trauma.  Patient also has medullary sponge kidney however her kidney function test are all normal.  She is seeing Dr. Donell Beers.  She has history of migraine headaches.  Review of Systems: Psychiatric: Agitation: No Hallucination: No Depressed Mood: Yes Insomnia: No Hypersomnia: No Altered Concentration: No Feels Worthless: No Grandiose Ideas: No Belief In Special Powers: No New/Increased Substance Abuse: No Compulsions: Yes  Neurologic: Headache: Yes Seizure: Yes Paresthesias: No    Outpatient Encounter Prescriptions as of 09/08/2013  Medication Sig  . clonazePAM (KLONOPIN) 1 MG tablet Take 1 tab at bed time  . FLUoxetine (PROZAC) 40 MG capsule Take 1 capsule daily  . LamoTRIgine XR (LAMICTAL XR) 200 MG TB24 Take 1 tablet (200 mg total) by mouth daily.  . Norethindrone-Ethinyl Estradiol-Fe (GENERESS FE) 0.8-25 MG-MCG tablet Chew 1 tablet by mouth daily.  . [DISCONTINUED] clonazePAM (KLONOPIN) 1 MG tablet Take 1 tab at bed time  . [DISCONTINUED] FLUoxetine (PROZAC) 40 MG capsule Take 1 capsule daily    No results found for this or any previous visit (from the past 2160 hour(s)).    Physical Exam: Constitutional:  BP 137/92  Pulse 83  Ht 5' 1.22" (1.555 m)  Wt 182 lb 6.4 oz (82.736 kg)  BMI 34.22 kg/m2  Musculoskeletal: Strength & Muscle Tone: within normal limits Gait & Station: normal Patient leans: N/A  Mental Status Examination;  Patient is a young female who is overweight, casually dressed and groomed.  She appears anxious but cooperative.  Her speech is clear and coherent.  Her thought processes logical and goal-directed.  She described her mood anxious and her affect is constricted.  She denies any active or passive suicidal thoughts or homicidal thoughts she denies any auditory or visual  hallucination.  There were no delusions. Her attention and concentration is fair.  She gets easily distracted.  She is alert and oriented x3.  Her insight judgment and impulse control is okay.   Medical Decision Making (Choose Three): Established Problem, Stable/Improving (1), Review of Last Therapy Session (1) and Review of Medication Regimen & Side Effects (2)  Assessment: Axis I: Obsessive-compulsive disorder, anxiety disorder NOS, ADHD  Axis II: Deferred  Axis III:  Past Medical History  Diagnosis Date  . Medullary sponge kidney   . Absence of menstruation 10/27/2009  . ASTHMA NOS W/ACUTE EXACERBATION 01/05/2010  . MIGRAINES, HX OF 06/24/2007  . PYELONEPHRITIS 06/24/2007  . Seizures   . Gallstones   . IBS (irritable bowel syndrome)   . OCD (obsessive compulsive disorder)   . Anxiety   . Degenerative disc disease     Axis IV: Moderate   Plan:  I will  continue Prozac 20 mg and Klonopin 1 mg at bedtime.  Recommend to call us back if she has any questions or any concern.  Recommend to see therapist for counseling.  Followup in 2 months.  Rachael Reder T., MD 09/08/2013

## 2013-09-08 NOTE — Telephone Encounter (Signed)
Rx faxed

## 2013-09-23 ENCOUNTER — Emergency Department (HOSPITAL_COMMUNITY)
Admission: EM | Admit: 2013-09-23 | Discharge: 2013-09-24 | Disposition: A | Discharge status: Home / Self Care | Payer: Medicaid Other | Attending: Emergency Medicine | Admitting: Emergency Medicine

## 2013-09-23 ENCOUNTER — Emergency Department (HOSPITAL_COMMUNITY): Payer: Medicaid Other

## 2013-09-23 ENCOUNTER — Encounter (HOSPITAL_COMMUNITY): Payer: Self-pay | Admitting: Emergency Medicine

## 2013-09-23 DIAGNOSIS — F411 Generalized anxiety disorder: Secondary | ICD-10-CM | POA: Insufficient documentation

## 2013-09-23 DIAGNOSIS — Z3202 Encounter for pregnancy test, result negative: Secondary | ICD-10-CM | POA: Insufficient documentation

## 2013-09-23 DIAGNOSIS — R630 Anorexia: Secondary | ICD-10-CM | POA: Insufficient documentation

## 2013-09-23 DIAGNOSIS — R51 Headache: Secondary | ICD-10-CM | POA: Insufficient documentation

## 2013-09-23 DIAGNOSIS — G40909 Epilepsy, unspecified, not intractable, without status epilepticus: Secondary | ICD-10-CM | POA: Insufficient documentation

## 2013-09-23 DIAGNOSIS — Z87448 Personal history of other diseases of urinary system: Secondary | ICD-10-CM | POA: Insufficient documentation

## 2013-09-23 DIAGNOSIS — Z8742 Personal history of other diseases of the female genital tract: Secondary | ICD-10-CM | POA: Insufficient documentation

## 2013-09-23 DIAGNOSIS — K8020 Calculus of gallbladder without cholecystitis without obstruction: Secondary | ICD-10-CM

## 2013-09-23 DIAGNOSIS — R110 Nausea: Secondary | ICD-10-CM | POA: Insufficient documentation

## 2013-09-23 DIAGNOSIS — Q615 Medullary cystic kidney: Secondary | ICD-10-CM | POA: Insufficient documentation

## 2013-09-23 DIAGNOSIS — Z79899 Other long term (current) drug therapy: Secondary | ICD-10-CM | POA: Insufficient documentation

## 2013-09-23 DIAGNOSIS — F429 Obsessive-compulsive disorder, unspecified: Secondary | ICD-10-CM | POA: Insufficient documentation

## 2013-09-23 DIAGNOSIS — G43909 Migraine, unspecified, not intractable, without status migrainosus: Secondary | ICD-10-CM | POA: Insufficient documentation

## 2013-09-23 DIAGNOSIS — R6883 Chills (without fever): Secondary | ICD-10-CM | POA: Insufficient documentation

## 2013-09-23 DIAGNOSIS — IMO0002 Reserved for concepts with insufficient information to code with codable children: Secondary | ICD-10-CM | POA: Insufficient documentation

## 2013-09-23 DIAGNOSIS — J45909 Unspecified asthma, uncomplicated: Secondary | ICD-10-CM | POA: Insufficient documentation

## 2013-09-23 LAB — URINALYSIS, ROUTINE W REFLEX MICROSCOPIC
Glucose, UA: NEGATIVE mg/dL
Hgb urine dipstick: NEGATIVE
Nitrite: NEGATIVE
Protein, ur: NEGATIVE mg/dL
Urobilinogen, UA: 1 mg/dL (ref 0.0–1.0)

## 2013-09-23 LAB — CBC WITH DIFFERENTIAL/PLATELET
Basophils Absolute: 0 10*3/uL (ref 0.0–0.1)
Basophils Relative: 0 % (ref 0–1)
Eosinophils Relative: 1 % (ref 0–5)
HCT: 39.3 % (ref 36.0–46.0)
Lymphocytes Relative: 25 % (ref 12–46)
Lymphs Abs: 2.5 10*3/uL (ref 0.7–4.0)
MCHC: 33.8 g/dL (ref 30.0–36.0)
MCV: 86.2 fL (ref 78.0–100.0)
Monocytes Absolute: 0.5 10*3/uL (ref 0.1–1.0)
Monocytes Relative: 5 % (ref 3–12)
Neutro Abs: 6.7 10*3/uL (ref 1.7–7.7)
RBC: 4.56 MIL/uL (ref 3.87–5.11)
RDW: 12.9 % (ref 11.5–15.5)
WBC: 9.7 10*3/uL (ref 4.0–10.5)

## 2013-09-23 LAB — COMPREHENSIVE METABOLIC PANEL
ALT: 14 U/L (ref 0–35)
AST: 14 U/L (ref 0–37)
CO2: 23 mEq/L (ref 19–32)
Chloride: 99 mEq/L (ref 96–112)
Creatinine, Ser: 0.78 mg/dL (ref 0.50–1.10)
GFR calc Af Amer: >90 mL/min (ref >90)
GFR calc non Af Amer: >90 mL/min (ref >90)
Glucose, Bld: 97 mg/dL (ref 70–99)
Sodium: 136 mEq/L — ABNORMAL LOW (ref 137–147)
Total Bilirubin: 0.2 mg/dL — ABNORMAL LOW (ref 0.3–1.2)
Total Protein: 7.9 g/dL (ref 6.0–8.3)

## 2013-09-23 MED ORDER — ONDANSETRON HCL 4 MG/2ML IJ SOLN
4.0000 mg | Freq: Once | INTRAMUSCULAR | Status: AC
  Administered 2013-09-23: 4 mg via INTRAVENOUS
  Filled 2013-09-23: qty 2

## 2013-09-23 MED ORDER — METOCLOPRAMIDE HCL 5 MG/ML IJ SOLN
10.0000 mg | Freq: Once | INTRAMUSCULAR | Status: AC
  Administered 2013-09-23: 10 mg via INTRAVENOUS
  Filled 2013-09-23: qty 2

## 2013-09-23 MED ORDER — HYDROMORPHONE HCL PF 1 MG/ML IJ SOLN
1.0000 mg | Freq: Once | INTRAMUSCULAR | Status: AC
  Administered 2013-09-23: 1 mg via INTRAVENOUS
  Filled 2013-09-23: qty 1

## 2013-09-23 NOTE — ED Notes (Signed)
Pt presents to ed with c/o nausea, chills and abdominal pain that radiates to her back which started about 2:00 pm today

## 2013-09-23 NOTE — ED Provider Notes (Signed)
CSN: [id number]     Arrival date & time 09/23/13  1740 History   First MD Initiated Contact with Patient 09/23/13 2009     Chief Complaint  Patient presents with  . Chills  . Abdominal Pain   (Consider location/radiation/quality/duration/timing/severity/associated sxs/prior Treatment) HPI Comments: Patient with history of gallstones presents with complaint of upper abdominal pain, nausea, chills which became much worse around 2 PM today. Pain radiates to her right shoulder blade. Patient has had similar pain in the past due to presumed gallstones which were diagnosed when she was pregnant 2 years ago. She has never had followup for these. She's never had pain this severe. No diarrhea, constipation, dysuria, hematuria. No treatments PTA. The onset of this condition was acute. The course is constant. Pain is worse whenever she takes a deep breath. She denies lower extremity edema, history of blood clots, recent immobilizations. She is on birth control medication.   Patient is a 27 y.o. female presenting with abdominal pain. The history is provided by the patient.  Abdominal Pain Associated symptoms: chills and nausea   Associated symptoms: no chest pain, no cough, no diarrhea, no dysuria, no fever, no sore throat and no vomiting     Past Medical History  Diagnosis Date  . Medullary sponge kidney   . Absence of menstruation 10/27/2009  . ASTHMA NOS W/ACUTE EXACERBATION 01/05/2010  . MIGRAINES, HX OF 06/24/2007  . PYELONEPHRITIS 06/24/2007  . Seizures   . Gallstones   . IBS (irritable bowel syndrome)   . OCD (obsessive compulsive disorder)   . Anxiety   . Degenerative disc disease    Past Surgical History  Procedure Laterality Date  . Back surgery    . Lumbar laminectomy/decompression microdiscectomy  07/24/2012    Procedure: LUMBAR LAMINECTOMY/DECOMPRESSION MICRODISCECTOMY;  Surgeon: Jacki Cones, MD;  Location: [facility];  Service: Orthopedics;  Laterality: Right;  L5-S1   Family  History  Problem Relation Age of Onset  . Anesthesia problems Neg Hx   . Heart disease Mother   . ADD / ADHD Sister    History  Substance Use Topics  . Smoking status: Never Smoker   . Smokeless tobacco: Never Used  . Alcohol Use: No     Comment: occas.   OB History   Grav Para Term Preterm Abortions TAB SAB Ect Mult Living   2 2 2  0 0 0 0 0 0 2     Review of Systems  Constitutional: Positive for chills and appetite change. Negative for fever.  HENT: Negative for rhinorrhea and sore throat.   Eyes: Negative for redness.  Respiratory: Negative for cough.   Cardiovascular: Negative for chest pain and leg swelling.  Gastrointestinal: Positive for nausea and abdominal pain. Negative for vomiting and diarrhea.  Genitourinary: Negative for dysuria.  Musculoskeletal: Negative for myalgias.  Skin: Negative for rash.  Neurological: Positive for headaches.    Allergies  Other; Ciprofloxacin; Nsaids; Topiramate; and Triptans  Home Medications   Current Outpatient Rx  Name  Route  Sig  Dispense  Refill  . clonazePAM (KLONOPIN) 1 MG tablet      Take 1 tab at bed time   30 tablet   1   . FLUoxetine (PROZAC) 40 MG capsule      Take 1 capsule daily   30 capsule   1   . LAMICTAL XR 200 MG TB24   Oral   Take 1 tablet (200 mg total) by mouth daily.   30 tablet  3     Dispense as written.    Please Handwrite Brand Medically Necessary Pharmac ...   . Norethindrone-Ethinyl Estradiol-Fe (GENERESS FE) 0.8-25 MG-MCG tablet   Oral   Chew 1 tablet by mouth daily.         . ondansetron (ZOFRAN ODT) 4 MG disintegrating tablet   Oral   Take 1 tablet (4 mg total) by mouth every 8 (eight) hours as needed for nausea or vomiting.   10 tablet   0   . oxyCODONE-acetaminophen (PERCOCET/ROXICET) 5-325 MG per tablet   Oral   Take 1-2 tablets by mouth every 6 (six) hours as needed for severe pain.   15 tablet   0    BP 116/69  Pulse 92  Temp(Src) 98.4 F (36.9 C)  Resp 16   SpO2 97%  LMP 09/08/2013 Physical Exam  Nursing note and vitals reviewed. Constitutional: She appears well-developed and well-nourished. She appears distressed.  Crying during exam.   HENT:  Head: Normocephalic and atraumatic.  Eyes: Conjunctivae are normal. Right eye exhibits no discharge. Left eye exhibits no discharge.  Neck: Normal range of motion. Neck supple.  Cardiovascular: Normal rate, regular rhythm and normal heart sounds.   Pulmonary/Chest: Effort normal and breath sounds normal.  Abdominal: Soft. There is tenderness in the right upper quadrant and epigastric area. There is no rigidity, no rebound, no guarding, no tenderness at McBurney's point and negative Murphy's sign.    Neurological: She is alert.  Skin: Skin is warm and dry.  Psychiatric: She has a normal mood and affect.    ED Course  Procedures (including critical care time) Labs Review Labs Reviewed  COMPREHENSIVE METABOLIC PANEL - Abnormal; Notable for the following:    Sodium 136 (*)    Potassium 3.5 (*)    Total Bilirubin 0.2 (*)    All other components within normal limits  URINALYSIS, ROUTINE W REFLEX MICROSCOPIC - Abnormal; Notable for the following:    APPearance CLOUDY (*)    Bilirubin Urine SMALL (*)    All other components within normal limits  CBC WITH DIFFERENTIAL  POCT PREGNANCY, URINE   Imaging Review US Abdomen Limited Ruq  09/23/2013   CLINICAL DATA:  Right upper quadrant pain.  EXAM: US ABDOMEN LIMITED - RIGHT UPPER QUADRANT  COMPARISON:  None.  FINDINGS: Gallbladder  Cholelithiasis with mobile shadowing stones in the gallbladder. Mild gallbladder sludge. No gallbladder wall thickening, no pericholecystic edema, and Murphy's sign is negative.  Common bile duct  Diameter: 7.7 mm, upper limits of normal.  Liver:  No focal lesion identified. Within normal limits in parenchymal echogenicity.  IMPRESSION: Cholelithiasis and mild gallbladder sludge. No wall thickening or inflammatory changes.    Electronically Signed   By: Burman Nieves M.D.   On: 09/23/2013 22:22    EKG Interpretation   None      Patient seen and examined. Work-up initiated. Medications ordered.   Vital signs reviewed and are as follows: Filed Vitals:   09/23/13 2333  BP: 125/71  Pulse: 76  Temp: 98.1 F (36.7 C)  Resp: 18   Patient did not have much pain relief from initial medications. She still c/o HA. Informed patient of [place] results. Reglan/dilaudid ordered.   12:28 AM Patient with much improvement after reglan. Very minimal tenderness on exam at this point. HA is resolved.   Will d/c to home with pain medication. Patient encouraged to followup with [facility] as outpatient, referral given.  The patient was urged to  return to the Emergency Department immediately with worsening of current symptoms, worsening abdominal pain, persistent vomiting, blood noted in stools, fever, or any other concerns. The patient verbalized understanding.     MDM   1. Cholelithiasis   2. Headache    Patient with upper abdominal pain with radiation to back consistent with symptomatic cholelithiasis. Ultrasound performed and does not demonstrate cholecystitis. Lab tests also did not indicate infection. Symptoms are controlled in emergency department with pain medication. She can f/u as outpatient. Patient has history of migraines and seemed to also have a migraine headache, resolved with Reglan. She is feeling much much better at time of discharge. Exam remains benign. Do not suspect PE or other intrathoracic cause of pain.    Renne Crigler, PA-C 09/24/13 0030

## 2013-09-24 MED ORDER — OXYCODONE-ACETAMINOPHEN 5-325 MG PO TABS: 1.0000 | ORAL_TABLET | Freq: Four times a day (QID) | ORAL | Status: DC | PRN

## 2013-09-24 MED ORDER — ONDANSETRON 4 MG PO TBDP: 4.0000 mg | ORAL_TABLET | Freq: Three times a day (TID) | ORAL | Status: DC | PRN

## 2013-09-27 NOTE — ED Provider Notes (Signed)
  Medical screening examination/treatment/procedure(s) were performed by non-physician practitioner and as supervising physician I was immediately available for consultation/collaboration.  EKG Interpretation   None         Gerhard Munchobert Spiro Ausborn, MD 09/27/13 [phone]

## 2013-09-29 DIAGNOSIS — Z8742 Personal history of other diseases of the female genital tract: Secondary | ICD-10-CM | POA: Insufficient documentation

## 2013-09-29 DIAGNOSIS — F32A Depression, unspecified: Secondary | ICD-10-CM | POA: Insufficient documentation

## 2013-09-29 DIAGNOSIS — F329 Major depressive disorder, single episode, unspecified: Secondary | ICD-10-CM | POA: Insufficient documentation

## 2013-10-06 ENCOUNTER — Encounter (INDEPENDENT_AMBULATORY_CARE_PROVIDER_SITE_OTHER): Payer: Self-pay | Admitting: General Surgery

## 2013-10-06 ENCOUNTER — Ambulatory Visit (INDEPENDENT_AMBULATORY_CARE_PROVIDER_SITE_OTHER): Payer: Medicaid Other | Admitting: General Surgery

## 2013-10-06 VITALS — BP 100/62 | HR 68 | Resp 16 | Ht 61.0 in | Wt 183.2 lb

## 2013-10-06 DIAGNOSIS — K8020 Calculus of gallbladder without cholecystitis without obstruction: Secondary | ICD-10-CM | POA: Insufficient documentation

## 2013-10-06 NOTE — Patient Instructions (Signed)
Laparoscopic Cholecystectomy °Laparoscopic cholecystectomy is surgery to remove the gallbladder. The gallbladder is located in the upper right part of the abdomen, behind the liver. It is a storage sac for bile produced in the liver. Bile aids in the digestion and absorption of fats. Cholecystectomy is often done for inflammation of the gallbladder (cholecystitis). This condition is usually caused by a buildup of gallstones (cholelithiasis) in your gallbladder. Gallstones can block the flow of bile, resulting in inflammation and pain. In severe cases, emergency surgery may be required. When emergency surgery is not required, you will have time to prepare for the procedure. °Laparoscopic surgery is an alternative to open surgery. Laparoscopic surgery has a shorter recovery time. Your common bile duct may also need to be examined during the procedure. If stones are found in the common bile duct, they may be removed. °LET YOUR HEALTH CARE PROVIDER KNOW ABOUT: °· Any allergies you have. °· All medicines you are taking, including vitamins, herbs, eye drops, creams, and over-the-counter medicines. °· Previous problems you or members of your family have had with the use of anesthetics. °· Any blood disorders you have. °· Previous surgeries you have had. °· Medical conditions you have. °RISKS AND COMPLICATIONS °Generally, this is a safe procedure. However, as with any procedure, complications can occur. Possible complications include: °· Infection. °· Damage to the common bile duct, nerves, arteries, veins, or other internal organs such as the stomach, liver, or intestines. °· Bleeding. °· A stone may remain in the common bile duct. °· A bile leak from the cyst duct that is clipped when your gallbladder is removed. °· The need to convert to open surgery, which requires a larger incision in the abdomen. This may be necessary if your surgeon thinks it is not safe to continue with a laparoscopic procedure. °BEFORE THE  PROCEDURE °· Ask your health care provider about changing or stopping any regular medicines. You will need to stop taking aspirin or blood thinners at least 5 days prior to surgery. °· Do not eat or drink anything after midnight the night before surgery. °· Let your health care provider know if you develop a cold or other infectious problem before surgery. °PROCEDURE  °· You will be given medicine to make you sleep through the procedure (general anesthetic). A breathing tube will be placed in your mouth. °· When you are asleep, your surgeon will make several small cuts (incisions) in your abdomen. °· A thin, lighted tube with a tiny camera on the end (laparoscope) is inserted through one of the small incisions. The camera on the laparoscope sends a picture to a TV screen in the operating room. This gives the surgeon a good view inside your abdomen. °· A gas will be pumped into your abdomen. This expands your abdomen so that the surgeon has more room to perform the surgery. °· Other tools needed for the procedure are inserted through the other incisions. The gallbladder is removed through one of the incisions. °· After the removal of your gallbladder, the incisions will be closed with stitches, staples, or skin glue. °AFTER THE PROCEDURE °· You will be taken to a recovery area where your progress will be checked often. °· You may be allowed to go home the same day if your pain is controlled and you can tolerate liquids. °Document Released: 09/11/2005 Document Revised: 07/02/2013 Document Reviewed: 04/23/2013 °Exit[employer]® Patient Information ©2014 [employer]. ° °

## 2013-10-06 NOTE — Progress Notes (Signed)
Patient ID: [name], female   DOB: July 03, 1986, 28 y.o.   MRN: [id number]  Chief Complaint  Patient presents with  . Other    Eval gallstones    HPI [name] is a 28 y.o. female.   HPI 28 year old Caucasian female referred by Dr. Jeraldine Loots for evaluation of gallstones. The patient states that she has known she has had gallstones since her last pregnancy in 2012. She had some mild symptoms at that time. However her symptoms have been coming more frequent. Now she has discomfort all the time. It generally is in her epigastric area and right upper quadrant and radiates to her back. It is associated with nausea. She had a severe attack on December 30 causing shortness of breath due to the pain which prompted her to go to the emergency room for evaluation. An ultrasound and lab work was performed. She denies any NSAID use. She denies any acholic stools. She has been trying diet modification. She knows that greasy fatty foods generally triggers a painful attack. She denies any weight loss. Her bowel movements alternate between diarrhea and constipation. She states she has a history of IBS. Past Medical History  Diagnosis Date  . Medullary sponge kidney   . Absence of menstruation 10/27/2009  . ASTHMA NOS W/ACUTE EXACERBATION 01/05/2010  . MIGRAINES, HX OF 06/24/2007  . PYELONEPHRITIS 06/24/2007  . Seizures   . Gallstones   . IBS (irritable bowel syndrome)   . OCD (obsessive compulsive disorder)   . Anxiety   . Degenerative disc disease     Past Surgical History  Procedure Laterality Date  . Back surgery    . Lumbar laminectomy/decompression microdiscectomy  07/24/2012    Procedure: LUMBAR LAMINECTOMY/DECOMPRESSION MICRODISCECTOMY;  Surgeon: Jacki Cones, MD;  Location: [facility];  Service: Orthopedics;  Laterality: Right;  L5-S1    Family History  Problem Relation Age of Onset  . Anesthesia problems Neg Hx   . Heart disease Mother   . ADD / ADHD Sister     Social History History   Substance Use Topics  . Smoking status: Never Smoker   . Smokeless tobacco: Never Used  . Alcohol Use: No     Comment: occas.    Allergies  Allergen Reactions  . Other Anaphylaxis    balsalmic vingerette  . Ciprofloxacin Other (See Comments)    Patient stated that her arm started burning really intensely.  . Nsaids Other (See Comments)    Patient states that she cant remember what her reaction to the medication was.  . Topiramate Other (See Comments)    Reaction unknown  . Triptans     Happened maybe in '05 possibly, reaction unsure.     Current Outpatient Prescriptions  Medication Sig Dispense Refill  . clonazePAM (KLONOPIN) 1 MG tablet Take 1 tab at bed time  30 tablet  1  . FLUoxetine (PROZAC) 40 MG capsule Take 1 capsule daily  30 capsule  1  . LAMICTAL XR 200 MG TB24 Take 1 tablet (200 mg total) by mouth daily.  30 tablet  3  . Norethindrone-Ethinyl Estradiol-Fe (GENERESS FE) 0.8-25 MG-MCG tablet Chew 1 tablet by mouth daily.      . ondansetron (ZOFRAN ODT) 4 MG disintegrating tablet Take 1 tablet (4 mg total) by mouth every 8 (eight) hours as needed for nausea or vomiting.  10 tablet  0  . oxyCODONE-acetaminophen (PERCOCET/ROXICET) 5-325 MG per tablet Take 1-2 tablets by mouth every 6 (six) hours as needed for severe pain.  15 tablet  0   No current facility-administered medications for this visit.    Review of Systems Review of Systems  Constitutional: Negative for fever, activity change, appetite change and unexpected weight change.  HENT: Negative for nosebleeds and trouble swallowing.   Eyes: Negative for photophobia and visual disturbance.  Respiratory: Negative for chest tightness and shortness of breath.   Cardiovascular: Negative for chest pain and leg swelling.       Denies CP, SOB, orthopnea, PND, DOE  Gastrointestinal: Positive for nausea, abdominal pain, diarrhea and constipation. Negative for vomiting.  Genitourinary: Negative for dysuria and difficulty  urinating.  Musculoskeletal: Negative for arthralgias.  Skin: Negative for pallor and rash.  Neurological: Positive for headaches. Negative for dizziness, seizures, facial asymmetry and numbness.       Denies TIA and amaurosis fugax; no recent grand mal sz; sometimes sz during sleep   Hematological: Negative for adenopathy. Does not bruise/bleed easily.  Psychiatric/Behavioral: Negative for behavioral problems and agitation.    Blood pressure 100/62, pulse 68, resp. rate 16, height 5\' 1"  (1.549 m), weight 183 lb 3.2 oz (83.099 kg), last menstrual period 09/08/2013.  Physical Exam Physical Exam  Data Reviewed ED note abd u/s - gallstones, nml duct Labs from 12/30 - nml cmet except for Na 136, K 3.5, nml cbc  Assessment    Symptomatic cholelithiasis     Plan    I believe the patient's symptoms are consistent with gallbladder disease.  We discussed gallbladder disease. The patient was given Agricultural engineereducational material. We discussed non-operative and operative management. We discussed the signs & symptoms of acute cholecystitis  I discussed laparoscopic cholecystectomy with IOC in detail.  The patient was given educational material as well as diagrams detailing the procedure.  We discussed the risks and benefits of a laparoscopic cholecystectomy including, but not limited to bleeding, infection, injury to surrounding structures such as the intestine or liver, bile leak, retained gallstones, need to convert to an open procedure, prolonged diarrhea, blood clots such as  DVT, common bile duct injury, anesthesia risks, and possible need for additional procedures.  We discussed the typical post-operative recovery course. I explained that the likelihood of improvement of their symptoms is good.  We did discuss the possibility of perioperative seizures because of her history of seizure disorder  Mary Sellaric M. Andrey CampanileWilson, MD, FACS General, Bariatric, & Minimally Invasive Surgery [facility],  [place]         [facility] LLCWILSON,Albirta Rhinehart M 10/06/2013, 2:51 PM

## 2013-10-16 ENCOUNTER — Other Ambulatory Visit (HOSPITAL_COMMUNITY): Payer: Self-pay | Admitting: *Deleted

## 2013-10-16 NOTE — Telephone Encounter (Signed)
See note

## 2013-10-28 ENCOUNTER — Telehealth: Payer: Self-pay | Admitting: Neurology

## 2013-10-28 NOTE — Telephone Encounter (Signed)
Patient thought this may be neurological related, twitching is on /off, keeps coming back. It started 2 wks ago,has been using warm compasses to help relieve, helps some but comes back

## 2013-10-28 NOTE — Telephone Encounter (Signed)
Needs urgent appointment to see nurse practitioner/M.D. Next available

## 2013-10-28 NOTE — Telephone Encounter (Signed)
Patient called to state that for the past 2 weeks her right eyebrow has been twitching constantly on and off and she said that it is driving her crazy and she cannot stand it anymore. Please call the patient and advise.

## 2013-10-29 NOTE — Telephone Encounter (Signed)
Patient has been scheduled/confirmed 

## 2013-10-30 ENCOUNTER — Encounter: Payer: Self-pay | Admitting: Nurse Practitioner

## 2013-10-30 ENCOUNTER — Ambulatory Visit (INDEPENDENT_AMBULATORY_CARE_PROVIDER_SITE_OTHER): Payer: Medicaid Other | Admitting: Nurse Practitioner

## 2013-10-30 VITALS — BP 123/71 | HR 81 | Temp 98.2°F | Ht 61.0 in | Wt 183.0 lb

## 2013-10-30 DIAGNOSIS — R259 Unspecified abnormal involuntary movements: Secondary | ICD-10-CM

## 2013-10-30 DIAGNOSIS — R253 Fasciculation: Secondary | ICD-10-CM

## 2013-10-30 DIAGNOSIS — G40802 Other epilepsy, not intractable, without status epilepticus: Secondary | ICD-10-CM

## 2013-10-30 DIAGNOSIS — F419 Anxiety disorder, unspecified: Secondary | ICD-10-CM

## 2013-10-30 DIAGNOSIS — R251 Tremor, unspecified: Secondary | ICD-10-CM

## 2013-10-30 DIAGNOSIS — F411 Generalized anxiety disorder: Secondary | ICD-10-CM

## 2013-10-30 NOTE — Patient Instructions (Signed)
Repeat EEG, Check labs today.  CMP, Lamictal level. If labs are normal, then it is likely not an organic problem, but psychiatric, and recommend to discuss with Dr. Lolly MustacheArfeen. Keep next follow up appointment.

## 2013-10-30 NOTE — Progress Notes (Signed)
PATIENT: [name] DOB: 08/19/1986   REASON FOR VISIT: follow up HISTORY FROM: patient  HISTORY OF PRESENT ILLNESS: [name] is a 28 year old Caucasian right handed female with a history of one clinical seizure. She believes this was a full body convulsion, grand mal at age 28 years or perhaps 7012. She was a Biochemist, clinicalcheerleader and participated in all sports, but during her teenage years developed exercise-induced asthma. Patient had a back surgery at age 28 in [address] on lumbar spine.  She had not seized again, until February of 2012, when she had a second seizure. Her boyfriend states this event was unprovoked, as far as she recalls, too. She stared, her eyes rolled back, blank stare and she became rigid. She has myoclonic jerks a couple of times a month, has back pain, and insomnia. Insomnia since October 2011. She has myoclonic jerks at times and she had called her and her school years complaints from teachers about her spacing out. She will continue what she was doing in an automatic fashion. She was driving and did not remember the last mile. EEG, normal the patient was called with the results.  01/23/2011 (CD): Patient presents today for work-in appointment for recently discovered pregnancy. I explained that I do not want her on any medication in to the 16th week of pregnancy (gestational age) is completed to avoid teratogenicity risks. Her sleep is deeper and she is neither nauseated nor did she have any spells but she is no longer on the Lamictal. May 9 is her GYN appointment Dr. Jennette KettleNeal at [facility].  04/12/2011 (CD): Patient is using Phenergan to sleep and treat nausea during pregnancy. No seizures but "spacey spells ", eyes twitching. Lump in the throat feeling. She is petite, depressed. Tearful= she made some work changes, since she developed chronic emesis. She has vision changes, feeling of days with blurring, described as if her "eyes are sinking into my head ".  04/21/2013 (LL):  Patient presents today for work in appointment after her recent unwitnessed seizure on July 19. Patient has been off of Lamictal X. are since pregnancy of 2012. She states she has a really hard time remembering to take medication on a consistent basis. On July 19, patient states she was home alone with 28-year-old and remembers looking down at her phone and feeling disconnected. She then awoke sitting on the sofa, feeling extremely tired with pain in the back of her head and neck. She started stuttering which has persisted since the episode. She called her husband who came home from work. She called the office and was told by the on-call M.D. to go to the ER. At the ER no imaging was done blood and urine samples were taken and 1 mg Ativan IV was given which calmed her symptoms but this stuttering persisted. No imaging was done. She was released home. No antiseizure medicine was started. Patient reports that since the episode her insomnia is worse and she is racing thoughts. Her right upper eye is twitching frequently. She feels like she cannot sit still. She feels that she cannot get enough sleep. On questioning patient has many OCD-type rituals. e.g. (silverware have to be placed a certain way and contained with like kinds, household items must be arranged alphabetically, etc.)   06/06/13 (CD): I had initiated a medical as well as recommended strongly a psychology or psychiatric visit to evaluate for depression with OCD. She has appointment for [facility] She has taken now 100 mg of X. are Lamictal daily. She is reportedly no problem with taking the medication or tolerating it without side effects.  She feels that the needle had some benefit for him would answer all CT as well but she is still very hypersonic and fatigued, she appears also still somewhat depressed she lacks the energy to rise in the morning to begin to talk to do her  household and feels easily exhausted by chores that in the past have not bothered her.   10/30/13 (LL): Patient called to state that for the past 2 weeks her right eyebrow has been twitching constantly on and off and she said that it is driving her crazy and she cannot stand it anymore.  Patient thought this may be neurological related, twitching is on /off, keeps coming back. Has been using warm compasses to help relieve, helps some but comes back.  She also has fine hand tremor.  Since last visit she was increased on Lamictal to 200 mg and was started on Prozac and titrated up to 40 mg daily and Klonopin 1 mg at hs by Dr. Lolly Mustache.   REVIEW OF SYSTEMS: Full 14 system review of systems performed and notable only for:  Insomnia, daytime sleepiness, headache, tremors, depression, anxiety.  ALLERGIES: Allergies  Allergen Reactions  . Other Anaphylaxis    balsalmic vingerette  . Ciprofloxacin Other (See Comments)    Patient stated that her arm started burning really intensely.  . Nsaids Other (See Comments)    Patient states that she cant remember what her reaction to the medication was.  . Topiramate Other (See Comments)    Reaction unknown  . Triptans     Happened maybe in '05 possibly, reaction unsure.     HOME MEDICATIONS: Outpatient Prescriptions Prior to Visit  Medication Sig Dispense Refill  . clonazePAM (KLONOPIN) 1 MG tablet Take 1 tab at bed time  30 tablet  1  . FLUoxetine (PROZAC) 40 MG capsule Take 1 capsule daily  30 capsule  1  . LAMICTAL XR 200 MG TB24 Take 1 tablet (200 mg total) by mouth daily.  30 tablet  3  . Norethindrone-Ethinyl Estradiol-Fe (GENERESS FE) 0.8-25 MG-MCG tablet Chew 1 tablet by mouth daily.      . ondansetron (ZOFRAN ODT) 4 MG disintegrating tablet Take 1 tablet (4 mg total) by mouth every 8 (eight) hours as needed for nausea or vomiting.  10 tablet  0  . oxyCODONE-acetaminophen (PERCOCET/ROXICET) 5-325 MG per tablet Take 1-2 tablets by mouth every 6  (six) hours as needed for severe pain.  15 tablet  0   No facility-administered medications prior to visit.    PAST MEDICAL HISTORY: Past Medical History  Diagnosis Date  . Medullary sponge kidney   . Absence of menstruation 10/27/2009  . ASTHMA NOS W/ACUTE EXACERBATION 01/05/2010  . MIGRAINES, HX OF 06/24/2007  . PYELONEPHRITIS 06/24/2007  . Seizures   . Gallstones   . IBS (irritable bowel syndrome)   . OCD (obsessive compulsive disorder)   . Anxiety   . Degenerative disc disease     PAST SURGICAL HISTORY: Past Surgical History  Procedure Laterality Date  . Back surgery    . Lumbar laminectomy/decompression microdiscectomy  07/24/2012    Procedure: LUMBAR LAMINECTOMY/DECOMPRESSION MICRODISCECTOMY;  Surgeon: Jacki Cones, MD;  Location: [facility];  Service: Orthopedics;  Laterality: Right;  L5-S1    FAMILY HISTORY: Family History  Problem Relation Age of Onset  .  Anesthesia problems Neg Hx   . Heart disease Mother   . ADD / ADHD Sister     SOCIAL HISTORY: History   Social History  . Marital Status: Single    Spouse Name: N/A    Number of Children: 2  . Years of Education: 14   Occupational History  . 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 Smoking status: Never Smoker   . Smokeless tobacco: Never Used  . Alcohol Use: No     Comment: occas.  . Drug Use: No  . Sexual Activity: Yes    Partners: Male    Birth Control/ Protection: None   Other Topics Concern  . Not on file   Social History Narrative   HSG, finished 2 years of college. single mom with 2 sons blake - Aug '09, Elijah Dec '12. 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 Still lives with father of her sons with wedding plans on hold ( Jan '13)     PHYSICAL EXAM  Filed Vitals:   10/30/13 1527  BP: 123/71  Pulse: 81  Temp: 98.2 F (36.8 C)  TempSrc: Oral  Height: 5\' 1"  (1.549 m)  Weight: 183 lb (83.008 kg)   Body mass index  is 34.6 kg/(m^2).  Generalized: In no acute distress, very anxious and tense appearing , pleasant obese Caucasian female  Neck: Supple, no carotid bruits  Cardiac: Regular rate rhythm, no murmur.  Pulmonary: Clear to auscultation bilaterally.   Neurological examination  Mentation: Alert oriented to time, place, history taking, language fluent, and causual conversation . The patient is able to smile today, lost weight and has reportedly easier control of her appetite and OCD symptoms.  Cranial nerve II-XII: Pupils were equal round reactive to light extraocular movements were full, visual field were full on confrontational test. facial sensation and strength were normal. hearing was intact to finger rubbing bilaterally. Uvula tongue midline. head turning and shoulder shrug and were normal and symmetric.Tongue protrusion into cheek strength was normal.  MOTOR: normal bulk and tone, full strength in the BUE, BLE, fine finger movements normal, fine hand tremor bilaterally.  SENSORY: normal and symmetric to light touch, pinprick, temperature, vibration and proprioception  COORDINATION: finger-nose-finger normal on the right, and rotation/supination weakness on the left arm, heel-to-shin normal bilaterally, there was no truncal ataxia.  REFLEXES: Brachioradialis 2/2, biceps 2/2, triceps 2/2, patellar 2/2, Achilles 2/2, plantar responses were flexor bilaterally.  GAIT/STATION: Rising up from seated position without assistance, normal stance, without trunk ataxia, moderate stride, good arm swing, smooth turning, able to perform tiptoe, and heel walking without difficulty.    ASSESSMENT AND PLAN 28 y.o. year old female has a past medical history of Medullary sponge kidney; Absence of menstruation (10/27/2009); ASTHMA NOS W/ACUTE EXACERBATION (01/05/2010); MIGRAINES, HX OF (06/24/2007); PYELONEPHRITIS (06/24/2007); Seizures; Gallstones; Insomnia and Anxiety here with eye muscle twitching and tremor.  Increased  muscle twitching and tremor could be from electrolyte imbalance, dehydration, caffeine, or from anxiety.  Continue taking the Lamictal at 200 mg XR as it seems to have had a positive effect on her mood and compulsion. She has been seeing Dr. Lolly Mustache at [facility] for her OCD, ADHD and anxiety disorder.  She is currently managed on Prozac 40 mg daily and Klonopin 1 mg at hs and is also seeing a therapist regularly. Dr. Oliva Bustard plan for [name] is to see her once  a year in this office for medication management.   PLAN: Repeat EEG Check labs today.  CMP, Lamictal level. If labs are normal, then it is likely not an organic problem, but psychiatric, and recommend to discuss with Dr. Lolly Mustache. Keep next follow up appointment. Orders Placed This Encounter  Procedures  . CMP  . Lamotrigine level  . Anemia Panel  . EEG   Tawny Asal Taylen Wendland, MSN, NP-C 10/30/2013, 3:58 PM [facility] [address] [place], [place] [address] [phone]  Note: This document was prepared with digital dictation and possible smart phrase technology. Any transcriptional errors that result from this process are unintentional.

## 2013-10-31 NOTE — Progress Notes (Signed)
I agree with the assessment and plan as directed by NP .The patient is known to me .   Niaomi Cartaya, MD  

## 2013-11-01 LAB — COMPREHENSIVE METABOLIC PANEL
ALT: 11 IU/L (ref 0–32)
AST: 14 IU/L (ref 0–40)
Albumin/Globulin Ratio: 1.5 (ref 1.1–2.5)
Albumin: 4.3 g/dL (ref 3.5–5.5)
Alkaline Phosphatase: 87 IU/L (ref 39–117)
BUN/Creatinine Ratio: 12 (ref 8–20)
BUN: 10 mg/dL (ref 6–20)
CO2: 22 mmol/L (ref 18–29)
Calcium: 9.3 mg/dL (ref 8.7–10.2)
Chloride: 100 mmol/L (ref 97–108)
Creatinine, Ser: 0.82 mg/dL (ref 0.57–1.00)
GFR calc Af Amer: 113 mL/min/{1.73_m2} (ref >59)
GFR calc non Af Amer: 98 mL/min/{1.73_m2} (ref >59)
Globulin, Total: 2.9 g/dL (ref 1.5–4.5)
Glucose: 85 mg/dL (ref 65–99)
Potassium: 3.9 mmol/L (ref 3.5–5.2)
Sodium: 141 mmol/L (ref 134–144)
Total Bilirubin: <0.2 mg/dL (ref 0.0–1.2)
Total Protein: 7.2 g/dL (ref 6.0–8.5)

## 2013-11-01 LAB — ANEMIA PROFILE B
Basophils Absolute: 0.1 10*3/uL (ref 0.0–0.2)
Basos: 1 %
Eos: 1 %
Eosinophils Absolute: 0.1 10*3/uL (ref 0.0–0.4)
Ferritin: 40 ng/mL (ref 15–150)
Folate: 13.4 ng/mL (ref >3.0)
HCT: 37.8 % (ref 34.0–46.6)
Hemoglobin: 12.6 g/dL (ref 11.1–15.9)
Immature Grans (Abs): 0 10*3/uL (ref 0.0–0.1)
Immature Granulocytes: 0 %
Iron Saturation: 9 % — CL (ref 15–55)
Iron: 37 ug/dL (ref 35–155)
Lymphocytes Absolute: 2.3 10*3/uL (ref 0.7–3.1)
Lymphs: 29 %
MCH: 28.8 pg (ref 26.6–33.0)
MCHC: 33.3 g/dL (ref 31.5–35.7)
MCV: 86 fL (ref 79–97)
Monocytes Absolute: 0.4 10*3/uL (ref 0.1–0.9)
Monocytes: 4 %
Neutrophils Absolute: 5.3 10*3/uL (ref 1.4–7.0)
Neutrophils Relative %: 65 %
Platelets: 351 10*3/uL (ref 150–379)
RBC: 4.38 x10E6/uL (ref 3.77–5.28)
RDW: 13.3 % (ref 12.3–15.4)
Retic Ct Pct: 1.7 % (ref 0.6–2.6)
TIBC: 433 ug/dL (ref 250–450)
UIBC: 396 ug/dL — ABNORMAL HIGH (ref 150–375)
Vitamin B-12: 275 pg/mL (ref 211–946)
WBC: 8.1 10*3/uL (ref 3.4–10.8)

## 2013-11-01 LAB — LAMOTRIGINE LEVEL: Lamotrigine Lvl: 3.2 ug/mL (ref 2.0–20.0)

## 2013-11-10 ENCOUNTER — Ambulatory Visit (INDEPENDENT_AMBULATORY_CARE_PROVIDER_SITE_OTHER): Payer: Medicaid Other | Admitting: Psychiatry

## 2013-11-10 ENCOUNTER — Ambulatory Visit (INDEPENDENT_AMBULATORY_CARE_PROVIDER_SITE_OTHER): Payer: Medicaid Other | Admitting: Radiology

## 2013-11-10 ENCOUNTER — Encounter (HOSPITAL_COMMUNITY): Payer: Self-pay | Admitting: Psychiatry

## 2013-11-10 VITALS — BP 135/88 | HR 82 | Ht 61.0 in | Wt 184.0 lb

## 2013-11-10 DIAGNOSIS — F411 Generalized anxiety disorder: Secondary | ICD-10-CM

## 2013-11-10 DIAGNOSIS — G40802 Other epilepsy, not intractable, without status epilepticus: Secondary | ICD-10-CM

## 2013-11-10 DIAGNOSIS — F419 Anxiety disorder, unspecified: Secondary | ICD-10-CM

## 2013-11-10 DIAGNOSIS — F429 Obsessive-compulsive disorder, unspecified: Secondary | ICD-10-CM

## 2013-11-10 DIAGNOSIS — F909 Attention-deficit hyperactivity disorder, unspecified type: Secondary | ICD-10-CM

## 2013-11-10 DIAGNOSIS — R251 Tremor, unspecified: Secondary | ICD-10-CM

## 2013-11-10 DIAGNOSIS — R253 Fasciculation: Secondary | ICD-10-CM

## 2013-11-10 MED ORDER — CLONAZEPAM 1 MG PO TABS: ORAL_TABLET | ORAL | Status: DC

## 2013-11-10 MED ORDER — FLUOXETINE HCL 40 MG PO CAPS: ORAL_CAPSULE | ORAL | Status: DC

## 2013-11-10 NOTE — Progress Notes (Signed)
[facility] [phone] Progress Note  [name] [id number] 28 y.o.  11/10/2013 10:39 AM  Chief Complaint:  Medication management and followup.       History of Present Illness:  Kaliana cave for her followup appointment.  She recently seen a neurologist and she has EEG but results are pending.  She continued to feel very tired during the day and taking frequent naps in the morning.  However she likes Prozac and Klonopin is helping her anxiety and depression.  She endorses sleep on and off and does not feel that she is getting enough sleep.  She continues to feel dizzy sometimes but they're less intense and less frequent from the past.  Her OCD symptoms are less intense.  She denies any paranoia, hallucination, crying spells, mood swing of any active or passive suicidal thoughts.  Her weight and appetite is unchanged from the past.  Recently her Lamictal was increased by her neurologist.  She seeing therapist in his office for counseling.  She is enrolled in [employer] and doing online courses.  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  She is currently not working.     Suicidal Ideation: No Plan Formed: No Patient has means to carry out plan: No  Homicidal Ideation: No Plan Formed: No Patient has means to carry out plan: No  Past Psychiatric History/Hospitalization(s) Patient endorsed history of depression and anxiety symptoms after the first child born.  She has symptoms of ADD however she has no problem in school.  She always get better grades.  She was never tested for ADHD.  She also has history of rituals, obsessions and compulsions.  She endorsed history of anxiety nervousness and trust issue.  She denies any history of mania, psychosis, paranoia or any hallucination.  She denies any history of suicidal attempt, self abusive behavior and aggression.   Anxiety: Yes Bipolar Disorder: No Depression: Yes Mania: No Psychosis: No Schizophrenia: No Personality  Disorder: No Hospitalization for psychiatric illness: No History of Electroconvulsive Shock Therapy: No Prior Suicide Attempts: No  Medical History; Patient has seizure disorder.  She started having grand mal seizures at early age.  She denies any history of head trauma.  Patient also has medullary sponge kidney however her kidney function test are all normal.  She is seeing Dr. [name].  She has history of migraine headaches.  Review of Systems: Psychiatric: Agitation: No Hallucination: No Depressed Mood: Yes Insomnia: No Hypersomnia: No Altered Concentration: No Feels Worthless: No Grandiose Ideas: No Belief In Special Powers: No New/Increased Substance Abuse: No Compulsions: Yes  Neurologic: Headache: Yes Seizure: Yes Paresthesias: No    Outpatient Encounter Prescriptions as of 11/10/2013  Medication Sig  . Ascorbic Acid (VITAMIN C) 100 MG tablet 100 mg.  . clonazePAM (KLONOPIN) 1 MG tablet Take 1 tab at bed time  . ferrous sulfate 325 (65 FE) MG tablet 325 mg.  . FLUoxetine (PROZAC) 40 MG capsule Take 1 capsule daily  . LAMICTAL XR 200 MG TB24 Take 1 tablet (200 mg total) by mouth daily.  . Norethindrone-Ethinyl Estradiol-Fe (GENERESS FE) 0.8-25 MG-MCG tablet Chew 1 tablet by mouth daily.  . [DISCONTINUED] clonazePAM (KLONOPIN) 1 MG tablet Take 1 tab at bed time  . [DISCONTINUED] FLUoxetine (PROZAC) 40 MG capsule Take 1 capsule daily    Recent Results (from the past 2160 hour(s))  CBC WITH DIFFERENTIAL     Status: None   Collection Time    09/23/13  8:20 PM  Result Value Ref Range   WBC 9.7  4.0 - 10.5 K/uL   RBC 4.56  3.87 - 5.11 MIL/uL   Hemoglobin 13.3  12.0 - 15.0 g/dL   HCT 39.3  36.0 - 46.0 %   MCV 86.2  78.0 - 100.0 fL   MCH 29.2  26.0 - 34.0 pg   MCHC 33.8  30.0 - 36.0 g/dL   RDW 12.9  11.5 - 15.5 %   Platelets 289  150 - 400 K/uL   Neutrophils Relative % 69  43 - 77 %   Neutro Abs 6.7  1.7 - 7.7 K/uL   Lymphocytes Relative 25  12 - 46 %    Lymphs Abs 2.5  0.7 - 4.0 K/uL   Monocytes Relative 5  3 - 12 %   Monocytes Absolute 0.5  0.1 - 1.0 K/uL   Eosinophils Relative 1  0 - 5 %   Eosinophils Absolute 0.1  0.0 - 0.7 K/uL   Basophils Relative 0  0 - 1 %   Basophils Absolute 0.0  0.0 - 0.1 K/uL  COMPREHENSIVE METABOLIC PANEL     Status: Abnormal   Collection Time    09/23/13  8:20 PM      Result Value Ref Range   Sodium 136 (*) 137 - 147 mEq/L   Comment: Please note change in reference range.   Potassium 3.5 (*) 3.7 - 5.3 mEq/L   Comment: Please note change in reference range.   Chloride 99  96 - 112 mEq/L   CO2 23  19 - 32 mEq/L   Glucose, Bld 97  70 - 99 mg/dL   BUN 9  6 - 23 mg/dL   Creatinine, Ser 0.78  0.50 - 1.10 mg/dL   Calcium 9.4  8.4 - 10.5 mg/dL   Total Protein 7.9  6.0 - 8.3 g/dL   Albumin 3.7  3.5 - 5.2 g/dL   AST 14  0 - 37 U/L   ALT 14  0 - 35 U/L   Alkaline Phosphatase 94  39 - 117 U/L   Total Bilirubin 0.2 (*) 0.3 - 1.2 mg/dL   GFR calc non Af Amer >90  >90 mL/min   GFR calc Af Amer >90  >90 mL/min   Comment: (NOTE)     The eGFR has been calculated using the CKD EPI equation.     This calculation has not been validated in all clinical situations.     eGFR's persistently <90 mL/min signify possible Chronic Kidney     Disease.  URINALYSIS, ROUTINE W REFLEX MICROSCOPIC     Status: Abnormal   Collection Time    09/23/13  8:39 PM      Result Value Ref Range   Color, Urine YELLOW  YELLOW   APPearance CLOUDY (*) CLEAR   Specific Gravity, Urine 1.028  1.005 - 1.030   pH 5.5  5.0 - 8.0   Glucose, UA NEGATIVE  NEGATIVE mg/dL   Hgb urine dipstick NEGATIVE  NEGATIVE   Bilirubin Urine SMALL (*) NEGATIVE   Ketones, ur NEGATIVE  NEGATIVE mg/dL   Protein, ur NEGATIVE  NEGATIVE mg/dL   Urobilinogen, UA 1.0  0.0 - 1.0 mg/dL   Nitrite NEGATIVE  NEGATIVE   Leukocytes, UA NEGATIVE  NEGATIVE   Comment: MICROSCOPIC NOT DONE ON URINES WITH NEGATIVE PROTEIN, BLOOD, LEUKOCYTES, NITRITE, OR GLUCOSE <1000 mg/dL.   POCT PREGNANCY, URINE     Status: None   Collection Time    09/23/13  8:43  PM      Result Value Ref Range   Preg Test, Ur NEGATIVE  NEGATIVE   Comment:            THE SENSITIVITY OF THIS     METHODOLOGY IS >24 mIU/mL  COMPREHENSIVE METABOLIC PANEL     Status: None   Collection Time    10/30/13  4:15 PM      Result Value Ref Range   Glucose 85  65 - 99 mg/dL   BUN 10  6 - 20 mg/dL   Creatinine, Ser 0.82  0.57 - 1.00 mg/dL   GFR calc non Af Amer 98  >59 mL/min/1.73   GFR calc Af Amer 113  >59 mL/min/1.73   BUN/Creatinine Ratio 12  8 - 20   Sodium 141  134 - 144 mmol/L   Potassium 3.9  3.5 - 5.2 mmol/L   Chloride 100  97 - 108 mmol/L   CO2 22  18 - 29 mmol/L   Calcium 9.3  8.7 - 10.2 mg/dL   Total Protein 7.2  6.0 - 8.5 g/dL   Albumin 4.3  3.5 - 5.5 g/dL   Globulin, Total 2.9  1.5 - 4.5 g/dL   Albumin/Globulin Ratio 1.5  1.1 - 2.5   Total Bilirubin <0.2  0.0 - 1.2 mg/dL   Alkaline Phosphatase 87  39 - 117 IU/L   AST 14  0 - 40 IU/L   ALT 11  0 - 32 IU/L  LAMOTRIGINE LEVEL     Status: None   Collection Time    10/30/13  4:15 PM      Result Value Ref Range   Lamotrigine Lvl 3.2  2.0 - 20.0 ug/mL   Comment:                                 Detection Limit = 1.0  ANEMIA PROFILE B     Status: Abnormal   Collection Time    10/30/13  4:15 PM      Result Value Ref Range   TIBC 433  250 - 450 ug/dL   UIBC 396 (*) 150 - 375 ug/dL   Iron 37  35 - 155 ug/dL   Iron Saturation 9 (*) 15 - 55 %   Ferritin 40  15 - 150 ng/mL   Vitamin B-12 275  211 - 946 pg/mL   Folate 13.4  >3.0 ng/mL   Comment: A serum folate concentration of less than 3.1 ng/mL is     considered to represent clinical deficiency.   WBC 8.1  3.4 - 10.8 x10E3/uL   RBC 4.38  3.77 - 5.28 x10E6/uL   Hemoglobin 12.6  11.1 - 15.9 g/dL   HCT 37.8  34.0 - 46.6 %   MCV 86  79 - 97 fL   MCH 28.8  26.6 - 33.0 pg   MCHC 33.3  31.5 - 35.7 g/dL   RDW 13.3  12.3 - 15.4 %   Platelets 351  150 - 379 x10E3/uL   Neutrophils  Relative % 65     Lymphs 29     Monocytes 4     Eos 1     Basos 1     Neutrophils Absolute 5.3  1.4 - 7.0 x10E3/uL   Lymphocytes Absolute 2.3  0.7 - 3.1 x10E3/uL   Monocytes Absolute 0.4  0.1 - 0.9 x10E3/uL   Eosinophils Absolute 0.1  0.0 - 0.4 x10E3/uL  Basophils Absolute 0.1  0.0 - 0.2 x10E3/uL   Immature Granulocytes 0     Immature Grans (Abs) 0.0  0.0 - 0.1 x10E3/uL   Retic Ct Pct 1.7  0.6 - 2.6 %      Physical Exam: Constitutional:  BP 135/88  Pulse 82  Ht 5' 1" (1.549 m)  Wt 184 lb (83.462 kg)  BMI 34.78 kg/m2  Musculoskeletal: Strength & Muscle Tone: within normal limits Gait & Station: normal Patient leans: N/A  Mental Status Examination;  Patient is a young female who is overweight, casually dressed and groomed.  She appears anxious but cooperative.  Her speech is clear and coherent.  Her thought processes logical and goal-directed.  She described her mood anxious and her affect is constricted.  She denies any active or passive suicidal thoughts or homicidal thoughts she denies any auditory or visual hallucination.  There were no delusions. Her attention and concentration is fair.  She gets easily distracted.  She is alert and oriented x3.  Her insight judgment and impulse control is okay.   Medical Decision Making (Choose Three): Established Problem, Stable/Improving (1), New Problem, with no additional work-up planned (3), Review of Last Therapy Session (1) and Review of Medication Regimen & Side Effects (2)  Assessment: Axis I: Obsessive-compulsive disorder, anxiety disorder NOS, ADHD  Axis II: Deferred  Axis III:  Past Medical History  Diagnosis Date  . Medullary sponge kidney   . Absence of menstruation 10/27/2009  . ASTHMA NOS W/ACUTE EXACERBATION 01/05/2010  . MIGRAINES, HX OF 06/24/2007  . PYELONEPHRITIS 06/24/2007  . Seizures   . Gallstones   . IBS (irritable bowel syndrome)   . OCD (obsessive compulsive disorder)   . Anxiety   . Degenerative disc  disease     Axis IV: Moderate   Plan:  I recommend to discuss with her neurologist about sleep studies.  Patient is scheduled to see her neurologist to discuss about EEG also.  At this time I would not change her current psychotropic medication.  Recommend to continue Prozac 20 mg and Klonopin 1 mg at bedtime.  Recommend to call us back if she has any questions or any concern.  Recommend to see therapist for counseling.  Followup in 2 months.  Elzia Hott T., MD 11/10/2013

## 2013-11-13 ENCOUNTER — Ambulatory Visit (HOSPITAL_COMMUNITY): Payer: Self-pay | Admitting: Psychology

## 2013-11-13 ENCOUNTER — Telehealth: Payer: Self-pay | Admitting: Neurology

## 2013-11-13 ENCOUNTER — Telehealth (HOSPITAL_COMMUNITY): Payer: Self-pay | Admitting: *Deleted

## 2013-11-13 DIAGNOSIS — F5113 Hypersomnia due to other mental disorder: Secondary | ICD-10-CM

## 2013-11-13 DIAGNOSIS — F429 Obsessive-compulsive disorder, unspecified: Secondary | ICD-10-CM

## 2013-11-13 DIAGNOSIS — F99 Mental disorder, not otherwise specified: Principal | ICD-10-CM

## 2013-11-13 DIAGNOSIS — F418 Other specified anxiety disorders: Secondary | ICD-10-CM

## 2013-11-13 NOTE — Telephone Encounter (Signed)
Dr. Kathryne SharperSyed Arfeen, refers patient for attended sleep study.  Height:  5'1  Weight: 183 lbs.  BMI: 34.60  Past Medical History:  Medullary sponge kidney  Absence of menstruation  10/27/2009  ASTHMA NOS W/ACUTE EXACERBATION  01/05/2010  MIGRAINES, HX OF  06/24/2007  PYELONEPHRITIS  06/24/2007  Seizures  Gallstones  IBS (irritable bowel syndrome)  OCD (obsessive compulsive disorder)  Anxiety  Degenerative disc disease    Sleep Symptoms: She feels that she cannot get enough sleep. Excessive daytime sleepniess   Epworth Score:  16  Medications:  Ascorbic Acid (Tab) vitamin C 100 MG 100 mg. ClonazePAM (Tab) KLONOPIN 1 MG Take 1 tab at bed time FLUoxetine HCl (Cap) PROZAC 40 MG Take 1 capsule daily Ferrous Sulfate (Tab) ferrous sulfate 325 (65 FE) MG 325 mg. LamoTRIgine (Tablet SR 24 hr) LAMICTAL XR 200 MG Take 1 tablet (200 mg total) by mouth daily. Norethin-Eth Estradiol-Fe (Chew Tab) GENERESSE 0.8-25 MG-MCG Chew 1 tablet by mouth daily.   Insurance: Medicaid  Please review patient information and submit instructions for scheduling and orders for sleep technologist.  Thank you!

## 2013-11-13 NOTE — Telephone Encounter (Signed)
Pt left VM 2/17 @ 8a (office closed due to inclement weather) :VM recv'd 2/18 @ 0840:Was to have sleep study one per MD.She contacted [facility] and was told MD needs to provide some type of referral. 11/13/13 @ 1103: [facility] [phone] - Please contact the MD office with information on what type referral/information is needed and whether to call/mail/fax. Informed pt that message left with [facility] regarding referral.Will update her after they have contacted the office

## 2013-11-14 NOTE — Telephone Encounter (Signed)
I called and spoke with the patient about scheduling her sleep study. Patient stated she needs to check her husband's schedule before scheduling her sleep study.

## 2013-11-19 ENCOUNTER — Encounter (HOSPITAL_COMMUNITY): Payer: Self-pay | Admitting: Pharmacist

## 2013-11-21 NOTE — Pre-Procedure Instructions (Signed)
Cleophus MoltRyian Minter  11/21/2013   Your procedure is scheduled on:  Tues, Mar 10 @ 7:30 AM  [facility]  at 5:30 AM.  Call this number if you have problems the morning of surgery: [phone]   Remember:   Do not eat food or drink liquids after midnight.   Take these medicines the morning of surgery with A SIP OF WATER: Lamictal(Lamotrigine)              No Goody's,BC's,Aleve,Aspirin,Ibuprofen,Fish Oil,or any Herbal Medications   Do not wear jewelry, make-up or nail polish.  Do not wear lotions, powders, or perfumes. You may wear deodorant.  Do not shave 48 hours prior to surgery.   Do not bring valuables to the hospital.  [facility]                  for any belongings or valuables.               Contacts, dentures or bridgework may not be worn into surgery.  Leave suitcase in the car. After surgery it may be brought to your room.  For patients admitted to the hospital, discharge time is determined by your                treatment team.               Patients discharged the day of surgery will not be allowed to drive  home.    Special Instructions:  [facility] - Preparing for Surgery  Before surgery, you can play an important role.  Because skin is not sterile, your skin needs to be as free of germs as possible.  You can reduce the number of germs on you skin by washing with CHG (chlorahexidine gluconate) soap before surgery.  CHG is an antiseptic cleaner which kills germs and bonds with the skin to continue killing germs even after washing.  Please DO NOT use if you have an allergy to CHG or antibacterial soaps.  If your skin becomes reddened/irritated stop using the CHG and inform your nurse when you arrive at Short Stay.  Do not shave (including legs and underarms) for at least 48 hours prior to the first CHG shower.  You may shave your face.  Please follow these instructions carefully:   1.  Shower with CHG Soap the night before  surgery and the                                morning of Surgery.  2.  If you choose to wash your hair, wash your hair first as usual with your       normal shampoo.  3.  After you shampoo, rinse your hair and body thoroughly to remove the                      Shampoo.  4.  Use CHG as you would any other liquid soap.  You can apply chg directly       to the skin and wash gently with scrungie or a clean washcloth.  5.  Apply the CHG Soap to your body ONLY FROM THE NECK DOWN.        Do not use on open wounds or open sores.  Avoid contact with your eyes,       ears, mouth and genitals (private parts).  Wash genitals (private parts)       with your normal soap.  6.  Wash thoroughly, paying special attention to the area where your surgery        will be performed.  7.  Thoroughly rinse your body with warm water from the neck down.  8.  DO NOT shower/wash with your normal soap after using and rinsing off       the CHG Soap.  9.  Pat yourself dry with a clean towel.            10.  Wear clean pajamas.            11.  Place clean sheets on your bed the night of your first shower and do not        sleep with pets.  Day of Surgery  Do not apply any lotions/deoderants the morning of surgery.  Please wear clean clothes to the hospital/surgery center.     Please read over the following fact sheets that you were given: Pain Booklet, Coughing and Deep Breathing and Surgical Site Infection Prevention

## 2013-11-24 ENCOUNTER — Encounter (HOSPITAL_COMMUNITY): Payer: Self-pay

## 2013-11-24 ENCOUNTER — Encounter (HOSPITAL_COMMUNITY)
Admission: RE | Admit: 2013-11-24 | Discharge: 2013-11-24 | Disposition: A | Discharge status: Home / Self Care | Payer: Medicaid Other | Source: Ambulatory Visit | Attending: General Surgery | Admitting: General Surgery

## 2013-11-24 ENCOUNTER — Ambulatory Visit (HOSPITAL_COMMUNITY)
Admission: RE | Admit: 2013-11-24 | Discharge: 2013-11-24 | Disposition: A | Discharge status: Home / Self Care | Payer: Medicaid Other | Source: Ambulatory Visit | Attending: Anesthesiology | Admitting: Anesthesiology

## 2013-11-24 DIAGNOSIS — Z01810 Encounter for preprocedural cardiovascular examination: Secondary | ICD-10-CM | POA: Insufficient documentation

## 2013-11-24 DIAGNOSIS — Z01812 Encounter for preprocedural laboratory examination: Secondary | ICD-10-CM | POA: Insufficient documentation

## 2013-11-24 DIAGNOSIS — Z01818 Encounter for other preprocedural examination: Secondary | ICD-10-CM | POA: Insufficient documentation

## 2013-11-24 HISTORY — DX: Other specified forms of tremor: G25.2

## 2013-11-24 HISTORY — DX: Frequency of micturition: R35.0

## 2013-11-24 HISTORY — DX: Dorsalgia, unspecified: M54.9

## 2013-11-24 HISTORY — DX: Gastro-esophageal reflux disease without esophagitis: K21.9

## 2013-11-24 HISTORY — DX: Personal history of urinary calculi: Z87.442

## 2013-11-24 HISTORY — DX: Personal history of other diseases of the nervous system and sense organs: Z86.69

## 2013-11-24 HISTORY — DX: Personal history of urinary (tract) infections: Z87.440

## 2013-11-24 HISTORY — DX: Anemia, unspecified: D64.9

## 2013-11-24 LAB — BASIC METABOLIC PANEL
BUN: 13 mg/dL (ref 6–23)
CALCIUM: 9.2 mg/dL (ref 8.4–10.5)
CO2: 26 mEq/L (ref 19–32)
Chloride: 99 mEq/L (ref 96–112)
Creatinine, Ser: 0.69 mg/dL (ref 0.50–1.10)
GFR calc Af Amer: >90 mL/min (ref >90)
GLUCOSE: 99 mg/dL (ref 70–99)
POTASSIUM: 3.9 meq/L (ref 3.7–5.3)
Sodium: 136 mEq/L — ABNORMAL LOW (ref 137–147)

## 2013-11-24 LAB — CBC
HCT: 38.8 % (ref 36.0–46.0)
Hemoglobin: 13 g/dL (ref 12.0–15.0)
MCH: 29.3 pg (ref 26.0–34.0)
MCHC: 33.5 g/dL (ref 30.0–36.0)
MCV: 87.6 fL (ref 78.0–100.0)
Platelets: 320 10*3/uL (ref 150–400)
RBC: 4.43 MIL/uL (ref 3.87–5.11)
RDW: 12.6 % (ref 11.5–15.5)
WBC: 7.4 10*3/uL (ref 4.0–10.5)

## 2013-11-24 LAB — HCG, SERUM, QUALITATIVE: PREG SERUM: NEGATIVE

## 2013-11-24 NOTE — Progress Notes (Signed)
Pt wants Hgb rechecked DOS regardless of what results are at PAT.Instructed her that would be based on her anesthesia doctor for that day and giving that order

## 2013-11-24 NOTE — Progress Notes (Signed)
Pt doesn't have a cardiologist  Denies ever having an echo/stress test/heart cath  Medical Md is with [facility] off of [address]-Dr.Stephanie Powers  Urologist is Dr.Colonado  Denies EKG or CXR in past yr

## 2013-11-27 NOTE — Progress Notes (Signed)
Quick Note:  Shared normal EEG results per Ms Lam's findings thru VM message ______

## 2013-11-28 NOTE — Procedures (Signed)
   [facility]  EEG (ELECTROENCEPHALOGRAM) REPORT   STUDY DATE: 11/10/13 PATIENT NAME: [name] DOB: 03/02/1986 MRN: [id number]  ORDERING CLINICIAN: Melvyn Novasarmen Dohmeier, MD   TECHNOLOGIST: Kaylyn LimSue Fox TECHNIQUE: Electroencephalogram was recorded utilizing standard 10-20 system of lead placement and reformatted into average and bipolar montages.  RECORDING TIME: 31 minutes ACTIVATION: hyperventilation and photic stimulation  CLINICAL INFORMATION: 28 year old female with eye twitching and tremors.  FINDINGS: Background rhythms of 10-11 hertz and 30-40 microvolts. No focal, lateralizing, epileptiform activity or seizures are seen. Patient recorded in the awake state. EKG channel shows regular rhythm 78 beats per minute. Eye movement artifact noted with photic stimulation, without epileptiform correlates.   IMPRESSION:  Normal EEG in the awake state.   INTERPRETING PHYSICIAN:  Suanne MarkerVIKRAM R. Glee Lashomb, MD Certified in Neurology, Neurophysiology and Neuroimaging  [facility] [address], [address] [place], [place] [address] [phone]

## 2013-12-01 MED ORDER — DEXTROSE 5 % IV SOLN
2.0000 g | INTRAVENOUS | Status: AC
  Administered 2013-12-02: 2 g via INTRAVENOUS
  Filled 2013-12-01 (×2): qty 2

## 2013-12-02 ENCOUNTER — Encounter (HOSPITAL_COMMUNITY): Payer: Medicaid Other | Admitting: Certified Registered Nurse Anesthetist

## 2013-12-02 ENCOUNTER — Encounter (HOSPITAL_COMMUNITY): Payer: Self-pay | Admitting: Surgery

## 2013-12-02 ENCOUNTER — Observation Stay (HOSPITAL_COMMUNITY)
Admission: RE | Admit: 2013-12-02 | Discharge: 2013-12-02 | Disposition: A | Discharge status: Home / Self Care | Payer: Medicaid Other | Source: Ambulatory Visit | Attending: General Surgery | Admitting: General Surgery

## 2013-12-02 ENCOUNTER — Encounter (HOSPITAL_COMMUNITY)
Admission: RE | Disposition: A | Discharge status: Home / Self Care | Payer: Self-pay | Source: Ambulatory Visit | Attending: General Surgery

## 2013-12-02 ENCOUNTER — Ambulatory Visit (HOSPITAL_COMMUNITY): Payer: Medicaid Other | Admitting: Certified Registered Nurse Anesthetist

## 2013-12-02 ENCOUNTER — Other Ambulatory Visit (INDEPENDENT_AMBULATORY_CARE_PROVIDER_SITE_OTHER): Payer: Self-pay | Admitting: General Surgery

## 2013-12-02 DIAGNOSIS — K219 Gastro-esophageal reflux disease without esophagitis: Secondary | ICD-10-CM | POA: Insufficient documentation

## 2013-12-02 DIAGNOSIS — K8020 Calculus of gallbladder without cholecystitis without obstruction: Principal | ICD-10-CM | POA: Insufficient documentation

## 2013-12-02 DIAGNOSIS — K589 Irritable bowel syndrome without diarrhea: Secondary | ICD-10-CM | POA: Insufficient documentation

## 2013-12-02 DIAGNOSIS — J45909 Unspecified asthma, uncomplicated: Secondary | ICD-10-CM | POA: Insufficient documentation

## 2013-12-02 DIAGNOSIS — IMO0002 Reserved for concepts with insufficient information to code with codable children: Secondary | ICD-10-CM | POA: Insufficient documentation

## 2013-12-02 DIAGNOSIS — Z9049 Acquired absence of other specified parts of digestive tract: Secondary | ICD-10-CM

## 2013-12-02 DIAGNOSIS — R259 Unspecified abnormal involuntary movements: Secondary | ICD-10-CM | POA: Insufficient documentation

## 2013-12-02 DIAGNOSIS — R569 Unspecified convulsions: Secondary | ICD-10-CM | POA: Insufficient documentation

## 2013-12-02 DIAGNOSIS — F411 Generalized anxiety disorder: Secondary | ICD-10-CM | POA: Insufficient documentation

## 2013-12-02 DIAGNOSIS — G43909 Migraine, unspecified, not intractable, without status migrainosus: Secondary | ICD-10-CM | POA: Insufficient documentation

## 2013-12-02 DIAGNOSIS — F429 Obsessive-compulsive disorder, unspecified: Secondary | ICD-10-CM | POA: Insufficient documentation

## 2013-12-02 DIAGNOSIS — K8010 Calculus of gallbladder with chronic cholecystitis without obstruction: Secondary | ICD-10-CM

## 2013-12-02 DIAGNOSIS — R350 Frequency of micturition: Secondary | ICD-10-CM | POA: Insufficient documentation

## 2013-12-02 DIAGNOSIS — D649 Anemia, unspecified: Secondary | ICD-10-CM | POA: Insufficient documentation

## 2013-12-02 HISTORY — PX: CHOLECYSTECTOMY: SHX55

## 2013-12-02 SURGERY — LAPAROSCOPIC CHOLECYSTECTOMY
Anesthesia: General | Site: Abdomen

## 2013-12-02 MED ORDER — ROCURONIUM BROMIDE 50 MG/5ML IV SOLN
INTRAVENOUS | Status: AC
  Filled 2013-12-02: qty 1

## 2013-12-02 MED ORDER — MIDAZOLAM HCL 5 MG/5ML IJ SOLN
INTRAMUSCULAR | Status: DC | PRN
  Administered 2013-12-02: 2 mg via INTRAVENOUS

## 2013-12-02 MED ORDER — OXYCODONE HCL 5 MG/5ML PO SOLN: 5.0000 mg | Freq: Once | ORAL | Status: DC | PRN

## 2013-12-02 MED ORDER — NEOSTIGMINE METHYLSULFATE 1 MG/ML IJ SOLN
INTRAMUSCULAR | Status: DC | PRN
  Administered 2013-12-02: 4 mg via INTRAVENOUS

## 2013-12-02 MED ORDER — LIDOCAINE HCL (CARDIAC) 20 MG/ML IV SOLN
INTRAVENOUS | Status: AC
  Filled 2013-12-02: qty 5

## 2013-12-02 MED ORDER — BUPIVACAINE-EPINEPHRINE 0.25% -1:200000 IJ SOLN
INTRAMUSCULAR | Status: DC | PRN
  Administered 2013-12-02: 16 mL

## 2013-12-02 MED ORDER — MORPHINE SULFATE 2 MG/ML IJ SOLN
1.0000 mg | INTRAMUSCULAR | Status: DC | PRN
  Administered 2013-12-02: 2 mg via INTRAVENOUS
  Filled 2013-12-02: qty 1

## 2013-12-02 MED ORDER — ARTIFICIAL TEARS OP OINT
TOPICAL_OINTMENT | OPHTHALMIC | Status: DC | PRN
  Administered 2013-12-02: 1 via OPHTHALMIC

## 2013-12-02 MED ORDER — HYDROMORPHONE HCL PF 1 MG/ML IJ SOLN
INTRAMUSCULAR | Status: AC
  Filled 2013-12-02: qty 1

## 2013-12-02 MED ORDER — 0.9 % SODIUM CHLORIDE (POUR BTL) OPTIME
TOPICAL | Status: DC | PRN
  Administered 2013-12-02: 1000 mL

## 2013-12-02 MED ORDER — OXYCODONE-ACETAMINOPHEN 5-325 MG PO TABS: 1.0000 | ORAL_TABLET | ORAL | Status: DC | PRN

## 2013-12-02 MED ORDER — LAMOTRIGINE ER 200 MG PO TB24: 200.0000 mg | ORAL_TABLET | Freq: Every day | ORAL | Status: DC

## 2013-12-02 MED ORDER — FENTANYL CITRATE 0.05 MG/ML IJ SOLN
INTRAMUSCULAR | Status: DC | PRN
  Administered 2013-12-02: 100 ug via INTRAVENOUS
  Administered 2013-12-02: 50 ug via INTRAVENOUS

## 2013-12-02 MED ORDER — IOHEXOL 300 MG/ML  SOLN
INTRAMUSCULAR | Status: DC | PRN
  Administered 2013-12-02: 1 mL via INTRAVENOUS

## 2013-12-02 MED ORDER — ONDANSETRON HCL 4 MG PO TABS
4.0000 mg | ORAL_TABLET | Freq: Four times a day (QID) | ORAL | Status: DC | PRN
Start: 2013-12-02 — End: 2013-12-02

## 2013-12-02 MED ORDER — HYDROMORPHONE HCL PF 1 MG/ML IJ SOLN
0.2500 mg | INTRAMUSCULAR | Status: DC | PRN
  Administered 2013-12-02 (×4): 0.5 mg via INTRAVENOUS

## 2013-12-02 MED ORDER — GLYCOPYRROLATE 0.2 MG/ML IJ SOLN
INTRAMUSCULAR | Status: DC | PRN
  Administered 2013-12-02: 0.6 mg via INTRAVENOUS

## 2013-12-02 MED ORDER — LIDOCAINE HCL (CARDIAC) 20 MG/ML IV SOLN
INTRAVENOUS | Status: DC | PRN
  Administered 2013-12-02: 60 mg via INTRAVENOUS

## 2013-12-02 MED ORDER — DEXAMETHASONE SODIUM PHOSPHATE 4 MG/ML IJ SOLN
INTRAMUSCULAR | Status: AC
  Filled 2013-12-02: qty 2

## 2013-12-02 MED ORDER — FENTANYL CITRATE 0.05 MG/ML IJ SOLN
INTRAMUSCULAR | Status: AC
  Filled 2013-12-02: qty 5

## 2013-12-02 MED ORDER — BUPIVACAINE-EPINEPHRINE (PF) 0.25% -1:200000 IJ SOLN
INTRAMUSCULAR | Status: AC
  Filled 2013-12-02: qty 30

## 2013-12-02 MED ORDER — CLONAZEPAM 1 MG PO TABS: 1.0000 mg | ORAL_TABLET | Freq: Every day | ORAL | Status: DC

## 2013-12-02 MED ORDER — ACETAMINOPHEN 325 MG PO TABS: 650.0000 mg | ORAL_TABLET | ORAL | Status: DC | PRN

## 2013-12-02 MED ORDER — MIDAZOLAM HCL 2 MG/2ML IJ SOLN
INTRAMUSCULAR | Status: AC
  Filled 2013-12-02: qty 2

## 2013-12-02 MED ORDER — SODIUM CHLORIDE 0.9 % IR SOLN
Status: DC | PRN
  Administered 2013-12-02: 1000 mL

## 2013-12-02 MED ORDER — LACTATED RINGERS IV SOLN
INTRAVENOUS | Status: DC | PRN
  Administered 2013-12-02: 07:00:00 via INTRAVENOUS

## 2013-12-02 MED ORDER — DIAZEPAM 5 MG/ML IJ SOLN
INTRAMUSCULAR | Status: AC
  Filled 2013-12-02: qty 2

## 2013-12-02 MED ORDER — ENOXAPARIN SODIUM 40 MG/0.4ML ~~LOC~~ SOLN: 40.0000 mg | SUBCUTANEOUS | Status: DC

## 2013-12-02 MED ORDER — ARTIFICIAL TEARS OP OINT
TOPICAL_OINTMENT | OPHTHALMIC | Status: AC
Start: 2013-12-02 — End: 2013-12-02
  Filled 2013-12-02: qty 3.5

## 2013-12-02 MED ORDER — ONDANSETRON HCL 4 MG/2ML IJ SOLN
INTRAMUSCULAR | Status: AC
  Filled 2013-12-02: qty 2

## 2013-12-02 MED ORDER — SODIUM CHLORIDE 0.9 % IV SOLN
INTRAVENOUS | Status: DC
  Administered 2013-12-02: 12:00:00 via INTRAVENOUS

## 2013-12-02 MED ORDER — FENTANYL CITRATE 0.05 MG/ML IJ SOLN: 50.0000 ug | Freq: Once | INTRAMUSCULAR | Status: DC

## 2013-12-02 MED ORDER — ONDANSETRON HCL 4 MG/2ML IJ SOLN
INTRAMUSCULAR | Status: DC | PRN
  Administered 2013-12-02: 4 mg via INTRAVENOUS

## 2013-12-02 MED ORDER — GLYCOPYRROLATE 0.2 MG/ML IJ SOLN
INTRAMUSCULAR | Status: AC
  Filled 2013-12-02: qty 4

## 2013-12-02 MED ORDER — NEOSTIGMINE METHYLSULFATE 1 MG/ML IJ SOLN
INTRAMUSCULAR | Status: AC
  Filled 2013-12-02: qty 10

## 2013-12-02 MED ORDER — OXYCODONE-ACETAMINOPHEN 5-325 MG PO TABS
1.0000 | ORAL_TABLET | ORAL | Status: DC | PRN
  Administered 2013-12-02 (×2): 2 via ORAL
  Filled 2013-12-02 (×2): qty 2

## 2013-12-02 MED ORDER — HYDROMORPHONE HCL PF 1 MG/ML IJ SOLN: 0.5000 mg | INTRAMUSCULAR | Status: DC | PRN

## 2013-12-02 MED ORDER — PROPOFOL 10 MG/ML IV BOLUS
INTRAVENOUS | Status: AC
  Filled 2013-12-02: qty 20

## 2013-12-02 MED ORDER — OXYCODONE HCL 5 MG PO TABS: 5.0000 mg | ORAL_TABLET | Freq: Once | ORAL | Status: DC | PRN

## 2013-12-02 MED ORDER — ROCURONIUM BROMIDE 100 MG/10ML IV SOLN
INTRAVENOUS | Status: DC | PRN
  Administered 2013-12-02: 50 mg via INTRAVENOUS

## 2013-12-02 MED ORDER — ONDANSETRON HCL 4 MG/2ML IJ SOLN: 4.0000 mg | Freq: Four times a day (QID) | INTRAMUSCULAR | Status: DC | PRN

## 2013-12-02 MED ORDER — PROMETHAZINE HCL 25 MG/ML IJ SOLN: 6.2500 mg | INTRAMUSCULAR | Status: DC | PRN

## 2013-12-02 MED ORDER — SCOPOLAMINE 1 MG/3DAYS TD PT72
MEDICATED_PATCH | TRANSDERMAL | Status: AC
  Administered 2013-12-02: 1 via TRANSDERMAL
  Filled 2013-12-02: qty 1

## 2013-12-02 MED ORDER — PROPOFOL 10 MG/ML IV BOLUS
INTRAVENOUS | Status: DC | PRN
  Administered 2013-12-02: 180 mg via INTRAVENOUS

## 2013-12-02 MED ORDER — MIDAZOLAM HCL 2 MG/2ML IJ SOLN: 1.0000 mg | INTRAMUSCULAR | Status: DC | PRN

## 2013-12-02 MED ORDER — EPHEDRINE SULFATE 50 MG/ML IJ SOLN
INTRAMUSCULAR | Status: DC | PRN
  Administered 2013-12-02: 15 mg via INTRAVENOUS

## 2013-12-02 MED ORDER — DEXAMETHASONE SODIUM PHOSPHATE 4 MG/ML IJ SOLN
INTRAMUSCULAR | Status: DC | PRN
  Administered 2013-12-02: 8 mg via INTRAVENOUS

## 2013-12-02 MED ORDER — DIAZEPAM 5 MG/ML IJ SOLN
5.0000 mg | Freq: Once | INTRAMUSCULAR | Status: AC
  Administered 2013-12-02: 5 mg via INTRAVENOUS

## 2013-12-02 SURGICAL SUPPLY — 51 items
APL SKNCLS STERI-STRIP NONHPOA (GAUZE/BANDAGES/DRESSINGS)
APPLIER CLIP 5 13 M/L LIGAMAX5 (MISCELLANEOUS) ×3
APR CLP MED LRG 5 ANG JAW (MISCELLANEOUS) ×1
BAG SPEC RTRVL LRG 6X4 10 (ENDOMECHANICALS) ×1
BANDAGE ADHESIVE 1X3 (GAUZE/BANDAGES/DRESSINGS) IMPLANT
BENZOIN TINCTURE PRP APPL 2/3 (GAUZE/BANDAGES/DRESSINGS) IMPLANT
BLADE SURG ROTATE 9660 (MISCELLANEOUS) IMPLANT
CANISTER SUCTION 2500CC (MISCELLANEOUS) ×3 IMPLANT
CHLORAPREP W/TINT 26ML (MISCELLANEOUS) ×3 IMPLANT
CLIP APPLIE 5 13 M/L LIGAMAX5 (MISCELLANEOUS) ×1 IMPLANT
COVER MAYO STAND STRL (DRAPES) ×3 IMPLANT
COVER SURGICAL LIGHT HANDLE (MISCELLANEOUS) ×3 IMPLANT
DECANTER SPIKE VIAL GLASS SM (MISCELLANEOUS) IMPLANT
DERMABOND ADVANCED (GAUZE/BANDAGES/DRESSINGS) ×2
DERMABOND ADVANCED .7 DNX12 (GAUZE/BANDAGES/DRESSINGS) ×1 IMPLANT
DRAPE C-ARM 42X72 X-RAY (DRAPES) ×3 IMPLANT
DRAPE UTILITY 15X26 W/TAPE STR (DRAPE) ×6 IMPLANT
ELECT REM PT RETURN 9FT ADLT (ELECTROSURGICAL) ×3
ELECTRODE REM PT RTRN 9FT ADLT (ELECTROSURGICAL) ×1 IMPLANT
GAUZE SPONGE 2X2 8PLY STRL LF (GAUZE/BANDAGES/DRESSINGS) IMPLANT
GLOVE BIOGEL M STRL SZ7.5 (GLOVE) IMPLANT
GLOVE BIOGEL PI IND STRL 7.0 (GLOVE) ×1 IMPLANT
GLOVE BIOGEL PI IND STRL 7.5 (GLOVE) ×1 IMPLANT
GLOVE BIOGEL PI IND STRL 8 (GLOVE) ×2 IMPLANT
GLOVE BIOGEL PI INDICATOR 7.0 (GLOVE) ×2
GLOVE BIOGEL PI INDICATOR 7.5 (GLOVE) ×2
GLOVE BIOGEL PI INDICATOR 8 (GLOVE) ×4
GLOVE SURG SS PI 7.0 STRL IVOR (GLOVE) ×3 IMPLANT
GLOVE SURG SS PI 7.5 STRL IVOR (GLOVE) ×12 IMPLANT
GOWN STRL REUS W/ TWL LRG LVL3 (GOWN DISPOSABLE) ×2 IMPLANT
GOWN STRL REUS W/ TWL XL LVL3 (GOWN DISPOSABLE) ×2 IMPLANT
GOWN STRL REUS W/TWL LRG LVL3 (GOWN DISPOSABLE) ×6
GOWN STRL REUS W/TWL XL LVL3 (GOWN DISPOSABLE) ×6
KIT BASIN OR (CUSTOM PROCEDURE TRAY) ×3 IMPLANT
KIT ROOM TURNOVER OR (KITS) ×3 IMPLANT
NS IRRIG 1000ML POUR BTL (IV SOLUTION) ×3 IMPLANT
PAD ARMBOARD 7.5X6 YLW CONV (MISCELLANEOUS) ×3 IMPLANT
POUCH SPECIMEN RETRIEVAL 10MM (ENDOMECHANICALS) ×3 IMPLANT
SCISSORS LAP 5X35 DISP (ENDOMECHANICALS) ×3 IMPLANT
SET CHOLANGIOGRAPH 5 50 .035 (SET/KITS/TRAYS/PACK) ×3 IMPLANT
SET IRRIG TUBING LAPAROSCOPIC (IRRIGATION / IRRIGATOR) ×3 IMPLANT
SLEEVE ENDOPATH XCEL 5M (ENDOMECHANICALS) ×6 IMPLANT
SPECIMEN JAR SMALL (MISCELLANEOUS) ×3 IMPLANT
SPONGE GAUZE 2X2 STER 10/PKG (GAUZE/BANDAGES/DRESSINGS)
SUT MNCRL AB 4-0 PS2 18 (SUTURE) ×3 IMPLANT
TOWEL OR 17X24 6PK STRL BLUE (TOWEL DISPOSABLE) ×3 IMPLANT
TOWEL OR 17X26 10 PK STRL BLUE (TOWEL DISPOSABLE) ×3 IMPLANT
TRAY LAPAROSCOPIC (CUSTOM PROCEDURE TRAY) ×3 IMPLANT
TROCAR XCEL BLUNT TIP 100MML (ENDOMECHANICALS) ×3 IMPLANT
TROCAR XCEL NON-BLD 5MMX100MML (ENDOMECHANICALS) ×3 IMPLANT
WATER STERILE IRR 1000ML POUR (IV SOLUTION) IMPLANT

## 2013-12-02 NOTE — Progress Notes (Signed)
Per Dr. Andrey CampanileWilson- ok to discharge patient if someone is available to stay with her. Patient's fiancee to remain with her. Discharge instructions reviewed with patient. Patient verbalizes understanding and able to state reason for admission and when to call MD. Discharge instructions and prescription given to patient. Patient discharged to home via wheelchair. Accompanied by husband.

## 2013-12-02 NOTE — H&P (Signed)
[name] is an 28 y.o. female.   Chief Complaint: here for surgery HPI: 28 year old Caucasian female referred by Dr. Jeraldine Barker for evaluation of gallstones. The patient states that she has known she has had gallstones since her last pregnancy in 2012. She had some mild symptoms at that time. However her symptoms have been coming more frequent. Now she has discomfort all the time. It generally is in her epigastric area and right upper quadrant and radiates to her back. It is associated with nausea. She had a severe attack on December 30 causing shortness of breath due to the pain which prompted her to go to the emergency room for evaluation. An ultrasound and lab work was performed. She denies any NSAID use. She denies any acholic stools. She has been trying diet modification. She knows that greasy fatty foods generally triggers a painful attack. She denies any weight loss. Her bowel movements alternate between diarrhea and constipation. She states she has a history of IBS.   Past Medical History  Diagnosis Date  . MIGRAINES, HX OF 06/24/2007  . Gallstones   . IBS (irritable bowel syndrome)     doesn't take any meds  . OCD (obsessive compulsive disorder)     takes Prozac daily  . Anxiety     takes Klonopin nightly  . ASTHMA NOS W/ACUTE EXACERBATION 01/05/2010    exercise induced  . History of migraine     last one a couple of days ago  . Coarse tremors   . Seizures     takes Lamictal daily;last seizure was in 11/14/10  . Degenerative disc disease     back  . Back pain   . GERD (gastroesophageal reflux disease)     but doesn't take anything for it  . Urinary frequency   . Medullary sponge kidney 2007    kidneys can be sick but she isnt sick  . PYELONEPHRITIS 06/24/2007  . History of UTI     frequent  . History of kidney stones   . Anemia     takes Ferrous Sulfate daily    Past Surgical History  Procedure Laterality Date  . Back surgery    . Lumbar laminectomy/decompression  microdiscectomy  07/24/2012    Procedure: LUMBAR LAMINECTOMY/DECOMPRESSION MICRODISCECTOMY;  Surgeon: Jacki Cones, MD;  Location: [facility];  Service: Orthopedics;  Laterality: Right;  L5-S1  . Colonoscopy      Family History  Problem Relation Age of Onset  . Anesthesia problems Neg Hx   . Heart disease Mother   . ADD / ADHD Sister    Social History:  reports that she has never smoked. She has never used smokeless tobacco. She reports that she drinks alcohol. She reports that she does not use illicit drugs.  Allergies:  Allergies  Allergen Reactions  . Other Anaphylaxis    balsalmic vingerette  . Ciprofloxacin Other (See Comments)    Patient stated that her arm started burning really intensely.  . Nsaids Other (See Comments)    Told to avoid NSAIDS due to her kidney disorder.  . Topiramate Other (See Comments)    Reaction unknown  . Triptans     Happened maybe in '05 possibly, reaction unsure.   . Adhesive [Tape] Rash    Medications Prior to Admission  Medication Sig Dispense Refill  . clonazePAM (KLONOPIN) 1 MG tablet Take 1 mg by mouth at bedtime.      . ferrous sulfate 325 (65 FE) MG tablet 325 mg daily with breakfast.       .  FLUoxetine (PROZAC) 40 MG capsule Take 40 mg by mouth at bedtime.      . LamoTRIgine XR (LAMICTAL XR) 200 MG TB24 Take 200 mg by mouth daily. BRAND NAME ONLY      . Norethindrone-Ethinyl Estradiol-Fe (GENERESS FE) 0.8-25 MG-MCG tablet Chew 1 tablet by mouth at bedtime.       . vitamin C (ASCORBIC ACID) 500 MG tablet Take 500 mg by mouth daily.        No results found for this or any previous visit (from the past 48 hour(s)). No results found.  Review of Systems  Constitutional: Negative for weight loss.  HENT: Negative for nosebleeds.   Eyes: Negative for blurred vision.  Respiratory: Negative for shortness of breath.   Cardiovascular: Negative for chest pain, palpitations, orthopnea and PND.       Denies DOE  Gastrointestinal: Negative for  nausea, vomiting and abdominal pain.        Had GB attack in mid feb prompting trip to ED  Genitourinary: Negative for dysuria and hematuria.  Musculoskeletal: Negative.   Skin: Negative for itching and rash.  Neurological: Positive for seizures. Negative for dizziness, focal weakness, loss of consciousness and headaches.       Denies TIAs, amaurosis fugax  Endo/Heme/Allergies: Does not bruise/bleed easily.  Psychiatric/Behavioral: The patient is nervous/anxious.     Blood pressure 158/82, pulse 64, temperature 98.3 F (36.8 C), temperature source Oral, height 5\' 1"  (1.549 m), weight 184 lb (83.462 kg), last menstrual period 11/25/2013, SpO2 100.00%. Physical Exam  Vitals reviewed. Constitutional: She is oriented to person, place, and time. She appears well-developed and well-nourished. No distress.  HENT:  Head: Normocephalic and atraumatic.  Right Ear: External ear normal.  Left Ear: External ear normal.  Eyes: Conjunctivae are normal. No scleral icterus.  Neck: Normal range of motion. No tracheal deviation present.  Cardiovascular: Normal rate.   Respiratory: Effort normal. No stridor. No respiratory distress.  GI: Soft. She exhibits no distension.  Musculoskeletal: She exhibits no edema and no tenderness.  Neurological: She is alert and oriented to person, place, and time. She exhibits normal muscle tone.  Skin: Skin is warm and dry. No rash noted. She is not diaphoretic. No erythema. No pallor.  Psychiatric: She has a normal mood and affect. Her behavior is normal. Judgment and thought content normal.     Assessment/Plan Symptomatic cholelithiasis  To OR for Lap chole All qeustions asked and answered  Mary Sellaric M. Andrey CampanileWilson, MD, FACS General, Bariatric, & Minimally Invasive Surgery [facility], [place]   [name],[name] 12/02/2013, 7:20 AM

## 2013-12-02 NOTE — Anesthesia Preprocedure Evaluation (Signed)
Anesthesia Evaluation  Patient identified by MRN, date of birth, ID band Patient awake    Reviewed: Allergy & Precautions, H&P , NPO status , Patient's Chart, lab work & pertinent test results  History of Anesthesia Complications Negative for: history of anesthetic complications (painful epidural placement with last pregnancy)  Airway Mallampati: I TM Distance: >3 FB Neck ROM: full    Dental  (+) Teeth Intact   Pulmonary asthma (last inhaler use 2-3 days ago, no hospitalizations) ,  breath sounds clear to auscultation        Cardiovascular negative cardio ROS  Rhythm:regular Rate:Normal     Neuro/Psych Seizures - (last in Feb 2012 - on lamictal), Well Controlled,  Anxiety negative neurological ROS  negative psych ROS   GI/Hepatic Gallstones   Endo/Other  negative endocrine ROS  Renal/GU Renal disease: medullary sponge kidney.medulary sponge  negative genitourinary   Musculoskeletal   Abdominal (+) + obese,   Peds  Hematology negative hematology ROS (+)   Anesthesia Other Findings   Reproductive/Obstetrics                           Anesthesia Physical Anesthesia Plan  ASA: II  Anesthesia Plan: General   Post-op Pain Management:    Induction: Intravenous  Airway Management Planned: Oral ETT  Additional Equipment:   Intra-op Plan:   Post-operative Plan: Extubation in OR  Informed Consent: I have reviewed the patients History and Physical, chart, labs and discussed the procedure including the risks, benefits and alternatives for the proposed anesthesia with the patient or authorized representative who has indicated his/her understanding and acceptance.     Plan Discussed with: CRNA and Surgeon  Anesthesia Plan Comments:         Anesthesia Quick Evaluation

## 2013-12-02 NOTE — Discharge Instructions (Signed)
[facility] °LAPAROSCOPIC SURGERY: POST OP INSTRUCTIONS °Always review your discharge instruction sheet given to you by the facility where your surgery was performed. °IF YOU HAVE DISABILITY OR FAMILY LEAVE FORMS, YOU MUST BRING THEM TO THE OFFICE FOR PROCESSING.   °DO NOT GIVE THEM TO YOUR DOCTOR. ° °1. A prescription for pain medication may be given to you upon discharge.  Take your pain medication as prescribed, if needed.  If narcotic pain medicine is not needed, then you may take acetaminophen (Tylenol) or ibuprofen (Advil) as needed. °2. Take your usually prescribed medications unless otherwise directed. °3. If you need a refill on your pain medication, please contact your pharmacy.  They will contact our office to request authorization. Prescriptions will not be filled after 5pm or on week-ends. °4. You should follow a light diet the first few days after arrival home, such as soup and crackers, etc.  Be sure to include lots of fluids daily. °5. Most patients will experience some swelling and bruising in the area of the incisions.  Ice packs will help.  Swelling and bruising can take several days to resolve.  °6. It is common to experience some constipation if taking pain medication after surgery.  Increasing fluid intake and taking a stool softener (such as Colace) will usually help or prevent this problem from occurring.  A mild laxative (Milk of Magnesia or Miralax) should be taken according to package instructions if there are no bowel movements after 48 hours. °7. If your surgeon used skin glue on the incision, you may shower in 24 hours.  The glue will flake off over the next 2-3 weeks.  Any sutures or staples will be removed at the office during your follow-up visit. °8. ACTIVITIES:  You may resume regular (light) daily activities beginning the next day--such as daily self-care, walking, climbing stairs--gradually increasing activities as tolerated.  You may have sexual  intercourse when it is comfortable.  Refrain from any heavy lifting or straining until approved by your doctor. °a. You may drive when you are no longer taking prescription pain medication, you can comfortably wear a seatbelt, and you can safely maneuver your car and apply brakes. °9. You should see your doctor in the office for a follow-up appointment approximately 2-3 weeks after your surgery.  Make sure that you call for this appointment within a day or two after you arrive home to insure a convenient appointment time. °10. OTHER INSTRUCTIONS:  °WHEN TO CALL YOUR DOCTOR: °1. Fever over 101.0 °2. Inability to urinate °3. Continued bleeding from incision. °4. Increased pain, redness, or drainage from the incision. °5. Increasing abdominal pain ° °The clinic staff is available to answer your questions during regular business hours.  Please don’t hesitate to call and ask to speak to one of the nurses for clinical concerns.  If you have a medical emergency, go to the nearest emergency room or call 911.  A surgeon from [facility] is always on call at the hospital. °[address], [address], [place], [place]  [address] ? [address] [address], [place], [place]   [address] °[phone] ? [phone] ? FAX [phone] °Web site: [email or web address] ° °

## 2013-12-02 NOTE — Transfer of Care (Signed)
Immediate Anesthesia Transfer of Care Note  Patient: [name]  Procedure(s) Performed: Procedure(s): LAPAROSCOPIC CHOLECYSTECTOMY WITH ATTEMPTED INTRAOPERATIVE CHOLANGIOGRAM (N/A)  Patient Location: PACU  Anesthesia Type:General  Level of Consciousness: awake, alert , oriented and patient cooperative  Airway & Oxygen Therapy: Patient Spontanous Breathing  Post-op Assessment: Report given to PACU RN, Post -op Vital signs reviewed and stable and Patient moving all extremities X 4  Post vital signs: Reviewed and stable  Complications: No apparent anesthesia complications

## 2013-12-02 NOTE — Op Note (Signed)
[name] [id number] 08/09/1986 12/02/2013   Laparoscopic Cholecystectomy Procedure Note  Indications: This patient presents with symptomatic gallbladder disease and will undergo laparoscopic cholecystectomy.  Pre-operative Diagnosis: symptomatic cholelithiasis  Post-operative Diagnosis: Same  Surgeon: Atilano InaWILSON,Anubis Fundora M   Assistants: Glenna FellowsHoxworth, Benjamin  Anesthesia: General endotracheal anesthesia  ASA Class: 2  Procedure Details  The patient was seen again in the Holding Room. The risks, benefits, complications, treatment options, and expected outcomes were discussed with the patient. The possibilities of reaction to medication, pulmonary aspiration, perforation of viscus, bleeding, recurrent infection, finding a normal gallbladder, the need for additional procedures, failure to diagnose a condition, the possible need to convert to an open procedure, and creating a complication requiring transfusion or operation were discussed with the patient. The likelihood of improving the patient's symptoms with return to their baseline status is good.  The patient and/or family concurred with the proposed plan, giving informed consent. The site of surgery properly noted. The patient was taken to Operating Room, identified as [name] and the procedure verified as Laparoscopic Cholecystectomy with Intraoperative Cholangiogram. A Time Out was held and the above information confirmed.  Prior to the induction of general anesthesia, antibiotic prophylaxis was administered. General endotracheal anesthesia was then administered and tolerated well. After the induction, the abdomen was prepped with Chloraprep and draped in the sterile fashion. The patient was positioned in the supine position.  Local anesthetic agent was injected into the skin near the umbilicus and an incision made. We dissected down to the abdominal fascia with blunt dissection.  The fascia was incised vertically and we entered the  peritoneal cavity bluntly.  A pursestring suture of 0-Vicryl was placed around the fascial opening.  The Hasson cannula was inserted and secured with the stay suture.  Pneumoperitoneum was then created with CO2 and tolerated well without any adverse changes in the patient's vital signs. An 5-mm port was placed in the subxiphoid position.  Two 5-mm ports were placed in the right upper quadrant. All skin incisions were infiltrated with a local anesthetic agent before making the incision and placing the trocars.   We positioned the patient in reverse Trendelenburg, tilted slightly to the patient's left.  The gallbladder was identified, the fundus grasped and retracted cephalad. Adhesions were lysed bluntly and with the electrocautery where indicated, taking care not to injure any adjacent organs or viscus. The infundibulum was grasped and retracted laterally, exposing the peritoneum overlying the triangle of Calot. This was then divided and exposed in a blunt fashion. A critical view of the cystic duct and cystic artery was obtained.  The cystic duct was clearly identified and bluntly dissected circumferentially. The cystic duct was ligated with a clip distally.   An incision was made in the cystic duct and the [facility] chol[facility] catheter was attempted to be introduced. However, I could not advance thru catheter thru what appeared to be a valve so I made no further attempts [facility]. The catheter was removed.  The cystic duct was then ligated with clips and divided. The cystic artery was identified, dissected free, ligated with clips and divided as well.   The gallbladder was dissected from the liver bed in retrograde fashion with the electrocautery. The gallbladder was removed and placed in an Endocatch sac.  The gallbladder and Endocatch sac were then removed through the umbilical port site. The liver bed was irrigated and inspected. Hemostasis was achieved with the electrocautery. Copious irrigation was  utilized and was repeatedly aspirated until clear.  The  pursestring suture was used to close the umbilical fascia.    We again inspected the right upper quadrant for hemostasis.  The umbilical closure was inspected and there was no air leak and nothing trapped within the closure. Pneumoperitoneum was released as we removed the trocars.  4-0 Monocryl was used to close the skin.   Dermabond was applied. The patient was then extubated and brought to the recovery room in stable condition. Instrument, sponge, and needle counts were correct at closure and at the conclusion of the case.   Findings: Cholelithiasis  Estimated Blood Loss: Minimal         Drains: none         Specimens: Gallbladder           Complications: None; patient tolerated the procedure well.         Disposition: PACU - hemodynamically stable.         Condition: stable  [name]. Rachael Campanile, MD, FACS General, Bariatric, & Minimally Invasive Surgery [facility], [place]

## 2013-12-02 NOTE — Preoperative (Signed)
Beta Blockers   Reason not to administer Beta Blockers:Not Applicable 

## 2013-12-02 NOTE — Anesthesia Postprocedure Evaluation (Signed)
  Anesthesia Post-op Note  Patient: [name]  Procedure(s) Performed: Procedure(s): LAPAROSCOPIC CHOLECYSTECTOMY WITH ATTEMPTED INTRAOPERATIVE CHOLANGIOGRAM (N/A)  Patient Location: PACU  Anesthesia Type:General  Level of Consciousness: awake and alert   Airway and Oxygen Therapy: Patient Spontanous Breathing  Post-op Pain: mild  Post-op Assessment: Post-op Vital signs reviewed, Patient's Cardiovascular Status Stable, Respiratory Function Stable, Patent Airway, No signs of Nausea or vomiting and Pain level controlled  Post-op Vital Signs: Reviewed and stable  Complications: No apparent anesthesia complications

## 2013-12-02 NOTE — Progress Notes (Signed)
Care of pt [facility] by [facility]. Pt appears comfortable, sleeping, VSS. When I awaken here, she says she "feels numb", pain "is better". Dr Andrey CampanileWilson called for update. He will admit her for overnight.

## 2013-12-02 NOTE — Progress Notes (Signed)
Patient presenting seizure activity. Patient has a history of seizures and states she took her medication this morning. Stable airway and o2 Sats at 97% on 3L. Dr. Gypsy BalsamKasik notified and states he will be over to see patient.

## 2013-12-03 NOTE — Discharge Summary (Signed)
Physician Discharge Summary  [name] ZOX:[id number] DOB: 07/03/1986 DOA: 12/02/2013  PCP: Illene Regulus, MD  Admit date: 12/02/2013 Discharge date: 12/02/2013  Recommendations for Outpatient Follow-up:  1.   Follow-up Information   Follow up with Atilano Ina, MD. Schedule an appointment as soon as possible for a visit in 3 weeks.   Specialty:  General Surgery   Contact information:   [address] [place] [address] [phone]      Discharge Diagnoses:  1. Symptomatic cholelithiasis  Surgical Procedure: Laparoscopic cholecystectomy  Discharge Condition: good Disposition: home  Diet recommendation: low fat   Filed Weights   12/02/13 0616 12/02/13 1230  Weight: 184 lb (83.462 kg) 184 lb (83.462 kg)    Hospital Course:  28 year old Caucasian female came in for planned laparoscopic cholecystectomy for symptomatic cholelithiasis. Please see operative note for further details. In PACU, the patient had eye twitching. There was no generalized seizure activity. She was taken upstairs to her room for observation. Her vital signs remained stable. There is no additional neurologic abnormalities. Later in the day the patient was doing well. Her vital signs are stable. She was oriented and appropriate. She was tolerating liquids. She was ventilating without difficulty. She was deemed stable for discharge after she requested to be discharged home. She did have her husband staying with her at home that night so I felt it was reasonable to discharge her home   Discharge Instructions  Discharge Orders   Future Appointments Provider Department Dept Phone   06/09/2014 2:00 PM Nilda Riggs, NP [facility] [phone]   Future Orders Complete By Expires   Increase activity slowly  As directed        Medication List         clonazePAM 1 MG tablet  Commonly known as:  KLONOPIN  Take 1 mg by mouth at bedtime.     ferrous sulfate 325 (65  FE) MG tablet  325 mg daily with breakfast.     FLUoxetine 40 MG capsule  Commonly known as:  PROZAC  Take 40 mg by mouth at bedtime.     GENERESS FE 0.8-25 MG-MCG tablet  Generic drug:  Norethindrone-Ethinyl Estradiol-Fe  Chew 1 tablet by mouth at bedtime.     LAMICTAL XR 200 MG Tb24  Generic drug:  LamoTRIgine XR  Take 200 mg by mouth daily. BRAND NAME ONLY     oxyCODONE-acetaminophen 5-325 MG per tablet  Commonly known as:  ROXICET  Take 1-2 tablets by mouth every 4 (four) hours as needed for severe pain.     vitamin C 500 MG tablet  Commonly known as:  ASCORBIC ACID  Take 500 mg by mouth daily.           Follow-up Information   Follow up with Atilano Ina, MD. Schedule an appointment as soon as possible for a visit in 3 weeks.   Specialty:  General Surgery   Contact information:   [address] [place] [address] [phone]        The results of significant diagnostics from this hospitalization (including imaging, microbiology, ancillary and laboratory) are listed below for reference.    Significant Diagnostic Studies: Dg Chest 2 View  11/24/2013   CLINICAL DATA:  Preop evaluation.  EXAM: CHEST  2 VIEW  COMPARISON:  DG THORACIC SPINE dated 11/04/2010; DG CHEST 2 VIEW dated 01/05/2010  FINDINGS: The heart size and mediastinal contours are within normal limits. Both lungs are clear.  The visualized skeletal structures are unremarkable.  IMPRESSION: No active cardiopulmonary disease.   Electronically Signed   By: Salome HolmesHector  Cooper M.D.   On: 11/24/2013 10:31    Microbiology: No results found for this or any previous visit (from the past 240 hour(s)).   Labs: Basic Metabolic Panel: No results found for this basename: NA, K, CL, CO2, GLUCOSE, BUN, CREATININE, CALCIUM, MG, PHOS,  in the last 168 hours Liver Function Tests: No results found for this basename: AST, ALT, ALKPHOS, BILITOT, PROT, ALBUMIN,  in the last 168 hours No results found for this  basename: LIPASE, AMYLASE,  in the last 168 hours No results found for this basename: AMMONIA,  in the last 168 hours CBC: No results found for this basename: WBC, NEUTROABS, HGB, HCT, MCV, PLT,  in the last 168 hours Cardiac Enzymes: No results found for this basename: CKTOTAL, CKMB, CKMBINDEX, TROPONINI,  in the last 168 hours BNP: BNP (last 3 results) No results found for this basename: PROBNP,  in the last 8760 hours CBG: No results found for this basename: GLUCAP,  in the last 168 hours  Active Problems:   S/P laparoscopic cholecystectomy   Time coordinating discharge: 10 minutes  Signed:  Atilano InaEric M Shawanda Sievert, MD [facility], [place] [phone] 12/03/2013, 9:56 AM

## 2013-12-04 ENCOUNTER — Encounter (HOSPITAL_COMMUNITY): Payer: Self-pay | Admitting: General Surgery

## 2013-12-04 ENCOUNTER — Telehealth (INDEPENDENT_AMBULATORY_CARE_PROVIDER_SITE_OTHER): Payer: Self-pay | Admitting: General Surgery

## 2013-12-04 NOTE — Telephone Encounter (Signed)
LMOM for patient to call [facility] and [facility]. She needs an apt with Dr Andrey CampanileWilson

## 2013-12-19 ENCOUNTER — Ambulatory Visit (INDEPENDENT_AMBULATORY_CARE_PROVIDER_SITE_OTHER): Payer: Medicaid Other | Admitting: General Surgery

## 2013-12-19 ENCOUNTER — Encounter (INDEPENDENT_AMBULATORY_CARE_PROVIDER_SITE_OTHER): Payer: Self-pay | Admitting: General Surgery

## 2013-12-19 VITALS — BP 122/86 | HR 78 | Temp 98.0°F | Resp 16 | Ht 61.0 in | Wt 186.2 lb

## 2013-12-19 DIAGNOSIS — Z09 Encounter for follow-up examination after completed treatment for conditions other than malignant neoplasm: Secondary | ICD-10-CM

## 2013-12-19 NOTE — Patient Instructions (Signed)
Call with any questions

## 2013-12-19 NOTE — Progress Notes (Signed)
Subjective:     Patient ID: [name], female   DOB: 09/14/1986, 28 y.o.   MRN: [id number]  HPI 28 year old obese Caucasian female comes in for followup after undergoing laparoscopic cholecystectomy on March 10. She went home the same day. She states the first week was a little bit rough however she feels back to her baseline self. She denies any current nausea, vomiting, diarrhea or constipation. She reports a normal appetite.  Review of Systems     Objective:   Physical Exam BP 122/86  Pulse 78  Temp(Src) 98 F (36.7 C) (Temporal)  Resp 16  Ht 5\' 1"  (1.549 m)  Wt 186 lb 3.2 oz (84.46 kg)  BMI 35.20 kg/m2  LMP 11/11/2013  Gen: alert, NAD, non-toxic appearing Pupils: equal, no scleral icterus Pulm: Lungs clear to auscultation, symmetric chest rise CV: regular rate and rhythm Abd: soft, nontender, nondistended. Well-healed trocar sites. No cellulitis. No incisional hernia Skin: no rash, no jaundice     Assessment:     Status post laparoscopic cholecystectomy     Plan:     She appears to be doing well. There appeared to be no complications from surgery. We reviewed her pathology report which showed chronic cholecystitis, cholesterolosis, and cholelithiasis. She was provided a copy. She was released to full activities. Followup as needed.  Mary SellaEric M. Andrey CampanileWilson, MD, FACS General, Bariatric, & Minimally Invasive Surgery [facility], [place]

## 2013-12-23 ENCOUNTER — Telehealth (INDEPENDENT_AMBULATORY_CARE_PROVIDER_SITE_OTHER): Payer: Self-pay

## 2013-12-23 DIAGNOSIS — R112 Nausea with vomiting, unspecified: Secondary | ICD-10-CM

## 2013-12-23 DIAGNOSIS — Z9889 Other specified postprocedural states: Principal | ICD-10-CM

## 2013-12-23 MED ORDER — ONDANSETRON HCL 4 MG PO TABS: ORAL_TABLET | ORAL | Status: DC

## 2013-12-23 NOTE — Telephone Encounter (Signed)
Patient calling into office to report that last night she reports chills, RUQ pain radiating to back, acid reflux, nausea and diarrhea.  Patient reports that laying in her recliner helped with some of her discomfort.  Patient requested a prescription for nausea medication.  Patient s/p Lap cholecystectomy with attempted IOC on 12/02/13.  Patient aware that I will review with Dr. Andrey CampanileWilson and call patient back with further instructions.

## 2013-12-23 NOTE — Telephone Encounter (Signed)
Spoke to Dr. Andrey CampanileWilson and rec'd verbal orders for CBC, CMET and Zofran 4mg  for nausea (sent to [facility]).  Patient advised to call our office if she continues to have pain, nausea or fevers.  Patient advised to go to the ER if her symptoms worsen.  Patient verbalized understanding and agrees with plan as above.

## 2013-12-23 NOTE — Addendum Note (Signed)
Addended by: Maryan PulsMOORE, Rachel Rison on: 12/23/2013 04:27 PM   Modules accepted: Orders

## 2013-12-24 LAB — COMPREHENSIVE METABOLIC PANEL
ALBUMIN: 3.8 g/dL (ref 3.5–5.2)
ALT: 12 U/L (ref 0–35)
AST: 13 U/L (ref 0–37)
Alkaline Phosphatase: 78 U/L (ref 39–117)
BUN: 11 mg/dL (ref 6–23)
CALCIUM: 8.6 mg/dL (ref 8.4–10.5)
CHLORIDE: 105 meq/L (ref 96–112)
CO2: 25 meq/L (ref 19–32)
CREATININE: 0.77 mg/dL (ref 0.50–1.10)
Glucose, Bld: 78 mg/dL (ref 70–99)
POTASSIUM: 4.1 meq/L (ref 3.5–5.3)
Sodium: 139 mEq/L (ref 135–145)
TOTAL PROTEIN: 6.9 g/dL (ref 6.0–8.3)
Total Bilirubin: 0.3 mg/dL (ref 0.2–1.2)

## 2013-12-24 LAB — CBC WITH DIFFERENTIAL/PLATELET
Basophils Absolute: 0 10*3/uL (ref 0.0–0.1)
Basophils Relative: 0 % (ref 0–1)
EOS PCT: 4 % (ref 0–5)
Eosinophils Absolute: 0.3 10*3/uL (ref 0.0–0.7)
HEMATOCRIT: 39 % (ref 36.0–46.0)
Hemoglobin: 13.1 g/dL (ref 12.0–15.0)
LYMPHS PCT: 30 % (ref 12–46)
Lymphs Abs: 2 10*3/uL (ref 0.7–4.0)
MCH: 29 pg (ref 26.0–34.0)
MCHC: 33.6 g/dL (ref 30.0–36.0)
MCV: 86.3 fL (ref 78.0–100.0)
Monocytes Absolute: 0.5 10*3/uL (ref 0.1–1.0)
Monocytes Relative: 7 % (ref 3–12)
Neutro Abs: 4 10*3/uL (ref 1.7–7.7)
Neutrophils Relative %: 59 % (ref 43–77)
PLATELETS: 308 10*3/uL (ref 150–400)
RBC: 4.52 MIL/uL (ref 3.87–5.11)
RDW: 13.4 % (ref 11.5–15.5)
WBC: 6.8 10*3/uL (ref 4.0–10.5)

## 2013-12-25 ENCOUNTER — Telehealth (INDEPENDENT_AMBULATORY_CARE_PROVIDER_SITE_OTHER): Payer: Self-pay | Admitting: General Surgery

## 2013-12-25 NOTE — Telephone Encounter (Signed)
Called patient to let her know that her lab work was normal and asked her how is she doing and she stated that she is doing better once she got the nausea med. I told her to call us back if she needs anything else

## 2014-01-04 ENCOUNTER — Ambulatory Visit (INDEPENDENT_AMBULATORY_CARE_PROVIDER_SITE_OTHER): Payer: Self-pay | Admitting: Neurology

## 2014-01-04 ENCOUNTER — Other Ambulatory Visit: Payer: Self-pay | Admitting: Neurology

## 2014-01-04 DIAGNOSIS — G4710 Hypersomnia, unspecified: Secondary | ICD-10-CM

## 2014-01-04 DIAGNOSIS — F418 Other specified anxiety disorders: Secondary | ICD-10-CM

## 2014-01-04 DIAGNOSIS — F99 Mental disorder, not otherwise specified: Principal | ICD-10-CM

## 2014-01-04 DIAGNOSIS — Z0289 Encounter for other administrative examinations: Secondary | ICD-10-CM

## 2014-01-04 DIAGNOSIS — F429 Obsessive-compulsive disorder, unspecified: Secondary | ICD-10-CM

## 2014-01-04 DIAGNOSIS — F5113 Hypersomnia due to other mental disorder: Secondary | ICD-10-CM

## 2014-01-08 ENCOUNTER — Encounter: Payer: Self-pay | Admitting: *Deleted

## 2014-01-08 ENCOUNTER — Telehealth: Payer: Self-pay | Admitting: Neurology

## 2014-01-08 NOTE — Sleep Study (Signed)
See media tab for full report  

## 2014-01-08 NOTE — Telephone Encounter (Signed)
I called and left a message for the patient about her recent sleep study results. I informed the patient that the study revealed no evidence of central or obstructive sleep apnea and therefore no need of intervention. I will fax a copy of the report to Dr. Lolly MustacheArfeen and Dr. Debby BudNorins offices and mail a copy of the report to patient.

## 2014-01-12 ENCOUNTER — Ambulatory Visit (HOSPITAL_COMMUNITY): Payer: Self-pay | Admitting: Psychiatry

## 2014-01-13 ENCOUNTER — Other Ambulatory Visit (HOSPITAL_COMMUNITY): Payer: Self-pay | Admitting: Psychiatry

## 2014-01-19 ENCOUNTER — Encounter (HOSPITAL_COMMUNITY): Payer: Self-pay | Admitting: Psychiatry

## 2014-01-19 ENCOUNTER — Telehealth: Payer: Self-pay | Admitting: Neurology

## 2014-01-19 ENCOUNTER — Ambulatory Visit (INDEPENDENT_AMBULATORY_CARE_PROVIDER_SITE_OTHER): Payer: Medicaid Other | Admitting: Psychiatry

## 2014-01-19 VITALS — BP 141/90 | HR 96 | Ht 60.5 in | Wt 184.6 lb

## 2014-01-19 DIAGNOSIS — F429 Obsessive-compulsive disorder, unspecified: Secondary | ICD-10-CM

## 2014-01-19 DIAGNOSIS — F909 Attention-deficit hyperactivity disorder, unspecified type: Secondary | ICD-10-CM

## 2014-01-19 DIAGNOSIS — F411 Generalized anxiety disorder: Secondary | ICD-10-CM

## 2014-01-19 DIAGNOSIS — F489 Nonpsychotic mental disorder, unspecified: Secondary | ICD-10-CM

## 2014-01-19 MED ORDER — CLONAZEPAM 1 MG PO TABS: 1.0000 mg | ORAL_TABLET | Freq: Every day | ORAL | Status: DC

## 2014-01-19 MED ORDER — FLUOXETINE HCL 40 MG PO CAPS: 40.0000 mg | ORAL_CAPSULE | Freq: Every day | ORAL | Status: DC

## 2014-01-19 NOTE — Telephone Encounter (Signed)
Pt's sleep study was negative for osa/csa.  During visit with Dr. Adele Schilder, pt was advised to proceed with Narcolepsy screening.  I explained to the patient that because of her current medications, she was not a good candidate for an MSLT but rather HLA testing.  Dr. Brett Fairy will need to advise if she would like to see this patient in consultation first or proceed with order for HLA.

## 2014-01-19 NOTE — Progress Notes (Signed)
[place] [phone] Progress Note  [name] [id number] 28 y.o.  01/19/2014 4:39 PM  Chief Complaint:  Medication management and followup.       History of Present Illness:  Patient  caMe for her followup appointment.  She poor sleep and anxiety symptoms.  She admitted panic attack 10 days ago.  She has not been taking Klonopin .  She ran out .  Recently she had a sleep study which does not shows any sleep apnea.  However she was recommended to see a neurologist for narcolepsy .  The patient continued to have morning naps and feeling very tired during the day.  She is compliant with Prozac.  She continues to feel dizzy sometimes.  However her OCD symptoms are much improved from the past.  She denies any agitation, anger, hallucinations or any paranoia.  She denies any crying spells.  Recently she has surgery for gallbladder.  She is recovering better.  She is enrolled in [employer] and doing online courses.  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  She is currently not working.  Her vitals are stable.  Her weight is unchanged from the past.   Suicidal Ideation: No Plan Formed: No Patient has means to carry out plan: No  Homicidal Ideation: No Plan Formed: No Patient has means to carry out plan: No  Past Psychiatric History/Hospitalization(s) Patient endorsed history of depression and anxiety symptoms after the first child born.  She has symptoms of ADD however she has no problem in school.  She always get better grades.  She was never tested for ADHD.  She also has history of rituals, obsessions and compulsions.  She endorsed history of anxiety nervousness and trust issue.  She denies any history of mania, psychosis, paranoia or any hallucination.  She denies any history of suicidal attempt, self abusive behavior and aggression.   Anxiety: Yes Bipolar Disorder: No Depression: Yes Mania: No Psychosis: No Schizophrenia: No Personality Disorder:  No Hospitalization for psychiatric illness: No History of Electroconvulsive Shock Therapy: No Prior Suicide Attempts: No  Medical History; Patient has seizure disorder.  She started having grand mal seizures at early age.  She denies any history of head trauma.  Patient also has medullary sponge kidney however her kidney function test are all normal.  She is seeing Dr. [name].  She has history of migraine headaches.  Review of Systems: Psychiatric: Agitation: No Hallucination: No Depressed Mood: Yes Insomnia: Yes Hypersomnia: No Altered Concentration: No Feels Worthless: No Grandiose Ideas: No Belief In Special Powers: No New/Increased Substance Abuse: No Compulsions: Yes  Neurologic: Headache: Yes Seizure: Yes Paresthesias: No    Outpatient Encounter Prescriptions as of 01/19/2014  Medication Sig  . clonazePAM (KLONOPIN) 1 MG tablet Take 1 tablet (1 mg total) by mouth at bedtime.  Marland Kitchen FLUoxetine (PROZAC) 40 MG capsule Take 1 capsule (40 mg total) by mouth at bedtime.  . [DISCONTINUED] clonazePAM (KLONOPIN) 1 MG tablet Take 1 mg by mouth at bedtime.  . [DISCONTINUED] FLUoxetine (PROZAC) 40 MG capsule Take 40 mg by mouth at bedtime.  . ferrous sulfate 325 (65 FE) MG tablet 325 mg daily with breakfast.   . LAMICTAL XR 200 MG TB24 TAKE 1 TABLET BY MOUTH EVERY DAY  . Norethindrone-Ethinyl Estradiol-Fe (GENERESS FE) 0.8-25 MG-MCG tablet Chew 1 tablet by mouth at bedtime.   . ondansetron (ZOFRAN) 4 MG tablet Take one tablet every 6 hours PRN nausea, #15, no refills, generic substitution allowed  . oxyCODONE-acetaminophen (ROXICET) 5-325  MG per tablet Take 1-2 tablets by mouth every 4 (four) hours as needed for severe pain.  . vitamin C (ASCORBIC ACID) 500 MG tablet Take 500 mg by mouth daily.    Recent Results (from the past 2160 hour(s))  COMPREHENSIVE METABOLIC PANEL     Status: None   Collection Time    10/30/13  4:15 PM      Result Value Ref Range   Glucose 85  65 - 99  mg/dL   BUN 10  6 - 20 mg/dL   Creatinine, Ser 0.82  0.57 - 1.00 mg/dL   GFR calc non Af Amer 98  >59 mL/min/1.73   GFR calc Af Amer 113  >59 mL/min/1.73   BUN/Creatinine Ratio 12  8 - 20   Sodium 141  134 - 144 mmol/L   Potassium 3.9  3.5 - 5.2 mmol/L   Chloride 100  97 - 108 mmol/L   CO2 22  18 - 29 mmol/L   Calcium 9.3  8.7 - 10.2 mg/dL   Total Protein 7.2  6.0 - 8.5 g/dL   Albumin 4.3  3.5 - 5.5 g/dL   Globulin, Total 2.9  1.5 - 4.5 g/dL   Albumin/Globulin Ratio 1.5  1.1 - 2.5   Total Bilirubin <0.2  0.0 - 1.2 mg/dL   Alkaline Phosphatase 87  39 - 117 IU/L   AST 14  0 - 40 IU/L   ALT 11  0 - 32 IU/L  LAMOTRIGINE LEVEL     Status: None   Collection Time    10/30/13  4:15 PM      Result Value Ref Range   Lamotrigine Lvl 3.2  2.0 - 20.0 ug/mL   Comment:                                 Detection Limit = 1.0  ANEMIA PROFILE B     Status: Abnormal   Collection Time    10/30/13  4:15 PM      Result Value Ref Range   TIBC 433  250 - 450 ug/dL   UIBC 396 (*) 150 - 375 ug/dL   Iron 37  35 - 155 ug/dL   Iron Saturation 9 (*) 15 - 55 %   Ferritin 40  15 - 150 ng/mL   Vitamin B-12 275  211 - 946 pg/mL   Folate 13.4  >3.0 ng/mL   Comment: A serum folate concentration of less than 3.1 ng/mL is     considered to represent clinical deficiency.   WBC 8.1  3.4 - 10.8 x10E3/uL   RBC 4.38  3.77 - 5.28 x10E6/uL   Hemoglobin 12.6  11.1 - 15.9 g/dL   HCT 37.8  34.0 - 46.6 %   MCV 86  79 - 97 fL   MCH 28.8  26.6 - 33.0 pg   MCHC 33.3  31.5 - 35.7 g/dL   RDW 13.3  12.3 - 15.4 %   Platelets 351  150 - 379 x10E3/uL   Neutrophils Relative % 65     Lymphs 29     Monocytes 4     Eos 1     Basos 1     Neutrophils Absolute 5.3  1.4 - 7.0 x10E3/uL   Lymphocytes Absolute 2.3  0.7 - 3.1 x10E3/uL   Monocytes Absolute 0.4  0.1 - 0.9 x10E3/uL   Eosinophils Absolute 0.1  0.0 - 0.4 x10E3/uL  Basophils Absolute 0.1  0.0 - 0.2 x10E3/uL   Immature Granulocytes 0     Immature Grans (Abs) 0.0  0.0 -  0.1 x10E3/uL   Retic Ct Pct 1.7  0.6 - 2.6 %  BASIC METABOLIC PANEL     Status: Abnormal   Collection Time    11/24/13  9:53 AM      Result Value Ref Range   Sodium 136 (*) 137 - 147 mEq/L   Potassium 3.9  3.7 - 5.3 mEq/L   Chloride 99  96 - 112 mEq/L   CO2 26  19 - 32 mEq/L   Glucose, Bld 99  70 - 99 mg/dL   BUN 13  6 - 23 mg/dL   Creatinine, Ser 0.69  0.50 - 1.10 mg/dL   Calcium 9.2  8.4 - 10.5 mg/dL   GFR calc non Af Amer >90  >90 mL/min   GFR calc Af Amer >90  >90 mL/min   Comment: (NOTE)     The eGFR has been calculated using the CKD EPI equation.     This calculation has not been validated in all clinical situations.     eGFR's persistently <90 mL/min signify possible Chronic Kidney     Disease.  CBC     Status: None   Collection Time    11/24/13  9:53 AM      Result Value Ref Range   WBC 7.4  4.0 - 10.5 K/uL   RBC 4.43  3.87 - 5.11 MIL/uL   Hemoglobin 13.0  12.0 - 15.0 g/dL   HCT 38.8  36.0 - 46.0 %   MCV 87.6  78.0 - 100.0 fL   MCH 29.3  26.0 - 34.0 pg   MCHC 33.5  30.0 - 36.0 g/dL   RDW 12.6  11.5 - 15.5 %   Platelets 320  150 - 400 K/uL  HCG, SERUM, QUALITATIVE     Status: None   Collection Time    11/24/13  9:53 AM      Result Value Ref Range   Preg, Serum NEGATIVE  NEGATIVE   Comment:            THE SENSITIVITY OF THIS     METHODOLOGY IS >10 mIU/mL.  CBC WITH DIFFERENTIAL     Status: None   Collection Time    12/23/13  8:37 AM      Result Value Ref Range   WBC 6.8  4.0 - 10.5 K/uL   RBC 4.52  3.87 - 5.11 MIL/uL   Hemoglobin 13.1  12.0 - 15.0 g/dL   HCT 39.0  36.0 - 46.0 %   MCV 86.3  78.0 - 100.0 fL   MCH 29.0  26.0 - 34.0 pg   MCHC 33.6  30.0 - 36.0 g/dL   RDW 13.4  11.5 - 15.5 %   Platelets 308  150 - 400 K/uL   Neutrophils Relative % 59  43 - 77 %   Neutro Abs 4.0  1.7 - 7.7 K/uL   Lymphocytes Relative 30  12 - 46 %   Lymphs Abs 2.0  0.7 - 4.0 K/uL   Monocytes Relative 7  3 - 12 %   Monocytes Absolute 0.5  0.1 - 1.0 K/uL   Eosinophils  Relative 4  0 - 5 %   Eosinophils Absolute 0.3  0.0 - 0.7 K/uL   Basophils Relative 0  0 - 1 %   Basophils Absolute 0.0  0.0 - 0.1 K/uL   Smear  Review Criteria for review not met    COMPREHENSIVE METABOLIC PANEL     Status: None   Collection Time    12/23/13  8:37 AM      Result Value Ref Range   Sodium 139  135 - 145 mEq/L   Potassium 4.1  3.5 - 5.3 mEq/L   Chloride 105  96 - 112 mEq/L   CO2 25  19 - 32 mEq/L   Glucose, Bld 78  70 - 99 mg/dL   BUN 11  6 - 23 mg/dL   Creat 0.77  0.50 - 1.10 mg/dL   Total Bilirubin 0.3  0.2 - 1.2 mg/dL   Alkaline Phosphatase 78  39 - 117 U/L   AST 13  0 - 37 U/L   ALT 12  0 - 35 U/L   Total Protein 6.9  6.0 - 8.3 g/dL   Albumin 3.8  3.5 - 5.2 g/dL   Calcium 8.6  8.4 - 10.5 mg/dL      Physical Exam: Constitutional:  BP 141/90  Pulse 96  Ht 5' 0.5" (1.537 m)  Wt 184 lb 9.6 oz (83.734 kg)  BMI 35.44 kg/m2  Musculoskeletal: Strength & Muscle Tone: within normal limits Gait & Station: normal Patient leans: N/A  Mental Status Examination;  Patient is a young female who is overweight, casually dressed and groomed.  She appears anxious but cooperative.  Her speech is clear and coherent.  Her thought processes logical and goal-directed.  She described her mood anxious and her affect is constricted.  She denies any active or passive suicidal thoughts or homicidal thoughts she denies any auditory or visual hallucination.  There were no delusions. Her attention and concentration is fair.  She gets easily distracted.  She is alert and oriented x3.  Her insight judgment and impulse control is okay.   Established Problem, Stable/Improving (1), Review of Psycho-Social Stressors (1), Review or order clinical lab tests (1), Review and summation of old records (2), Review of Last Therapy Session (1) and Review of Medication Regimen & Side Effects (2)  Assessment: Axis I: Obsessive-compulsive disorder, anxiety disorder NOS, ADHD  Axis II: Deferred  Axis  III:  Past Medical History  Diagnosis Date  . MIGRAINES, HX OF 06/24/2007  . Gallstones   . IBS (irritable bowel syndrome)     doesn't take any meds  . OCD (obsessive compulsive disorder)     takes Prozac daily  . Anxiety     takes Klonopin nightly  . ASTHMA NOS W/ACUTE EXACERBATION 01/05/2010    exercise induced  . History of migraine     last one a couple of days ago  . Coarse tremors   . Seizures     takes Lamictal daily;last seizure was in 11/14/10  . Degenerative disc disease     back  . Back pain   . GERD (gastroesophageal reflux disease)     but doesn't take anything for it  . Urinary frequency   . Medullary sponge kidney 2007    kidneys can be sick but she isnt sick  . PYELONEPHRITIS 06/24/2007  . History of UTI     frequent  . History of kidney stones   . Anemia     takes Ferrous Sulfate daily    Axis IV: Moderate   Plan:  I reviewed records , blood results and sleep study results .  Recommended to restart Klonopin which she has not taken it for 10 days.  Recommended to call us if  she ever ran out from the medication .  Patient continues to have morning naps and feeling very tired.  Recommended to see neurologist to rule out narcolepsy .  I will continue Klonopin and Prozac at present dose.  Her OCD symptoms are getting better.  Discussed the risk and benefits of medication.  Followup in 2 months.  Time spent 25 minutes.  More than 50% of the time spent in psychoeducation, counseling and coordination of care.  Discuss safety plan that anytime having active suicidal thoughts or homicidal thoughts then patient need to call 911 or go to the local emergency room.   Jordyn Hofacker T., MD 01/19/2014

## 2014-01-20 ENCOUNTER — Telehealth: Payer: Self-pay | Admitting: Neurology

## 2014-01-20 ENCOUNTER — Other Ambulatory Visit (INDEPENDENT_AMBULATORY_CARE_PROVIDER_SITE_OTHER): Payer: Self-pay

## 2014-01-20 DIAGNOSIS — Z5181 Encounter for therapeutic drug level monitoring: Secondary | ICD-10-CM

## 2014-01-20 DIAGNOSIS — Z0289 Encounter for other administrative examinations: Secondary | ICD-10-CM

## 2014-01-20 NOTE — Telephone Encounter (Signed)
The patient did not have any abnormalities in her PSG , and HLA testing has a lower yield given her normal nocturnal sleep. I will still offer it.   Dr Adele Schilder should get a copy of her sleep study and the HLA test, if both are negative, than I have no organic sleep disorder to persue. CD

## 2014-01-20 NOTE — Telephone Encounter (Signed)
The patient had another jerking episode coming out of sleep today. The patient is on Lamictal taking 200 mg SR tablet at night. The last level was 3.2 of Lamictal in February. I'll have him come in for another blood level checked. The patient was off of clonazepam until yesterday, restarted. If the patient has myoclonus coming out of sleep, potentially Keppra may be a better selection for treatment of these events.

## 2014-01-22 ENCOUNTER — Telehealth: Payer: Self-pay | Admitting: Neurology

## 2014-01-22 LAB — LAMOTRIGINE LEVEL: LAMOTRIGINE LVL: 1 ug/mL — AB (ref 2.0–20.0)

## 2014-01-22 MED ORDER — LAMOTRIGINE 25 MG PO TABS: ORAL_TABLET | ORAL | Status: DC

## 2014-01-22 MED ORDER — LAMOTRIGINE ER 300 MG PO TB24: 300.0000 mg | ORAL_TABLET | Freq: Every day | ORAL | Status: DC

## 2014-01-22 NOTE — Telephone Encounter (Signed)
I called patient. The lamotrigine level was 1.0, 2 months ago it was 3.2. The patient indicates that she has not missed any doses, it is not clear why the level has dropped so much. I will go up on the dose. The patient will be given Lamictal 25 mg tablets taking 30 mg in the morning for 2 weeks, and then go to 100 mg in the morning. She will then be converted to 300 mg daily of the XR Lamictal.

## 2014-01-22 NOTE — Telephone Encounter (Signed)
Severely OCD and depressed patient , no KEPPRA for her.

## 2014-01-23 ENCOUNTER — Telehealth: Payer: Self-pay | Admitting: Neurology

## 2014-01-23 NOTE — Telephone Encounter (Signed)
Left message for Dr. Joneen Caraway at [facility], will do HLA test for narcolepsy ( low yield ) see normal sleep study with great difference in patients sleep perception to the the recorded time.  Referred to Dr Adele Schilder , psychiatry. She has severe depression, anxiety and OCD- panic attacks more likely cause for her "spells" that I am no calling seizuers.  I am willing to arrange referral for Vestibular rehab , but a neurological follow up is not needed.  lamicaal presribed for OCD and not seizures, level is irrelevant . CD

## 2014-01-31 ENCOUNTER — Emergency Department (HOSPITAL_COMMUNITY)
Admission: EM | Admit: 2014-01-31 | Discharge: 2014-01-31 | Disposition: A | Discharge status: Home / Self Care | Payer: Medicaid Other | Attending: Emergency Medicine | Admitting: Emergency Medicine

## 2014-01-31 ENCOUNTER — Encounter (HOSPITAL_COMMUNITY): Payer: Self-pay | Admitting: Emergency Medicine

## 2014-01-31 DIAGNOSIS — Y939 Activity, unspecified: Secondary | ICD-10-CM | POA: Insufficient documentation

## 2014-01-31 DIAGNOSIS — W268XXA Contact with other sharp object(s), not elsewhere classified, initial encounter: Secondary | ICD-10-CM | POA: Insufficient documentation

## 2014-01-31 DIAGNOSIS — D649 Anemia, unspecified: Secondary | ICD-10-CM | POA: Insufficient documentation

## 2014-01-31 DIAGNOSIS — Y929 Unspecified place or not applicable: Secondary | ICD-10-CM | POA: Insufficient documentation

## 2014-01-31 DIAGNOSIS — Z87440 Personal history of urinary (tract) infections: Secondary | ICD-10-CM | POA: Insufficient documentation

## 2014-01-31 DIAGNOSIS — Z79899 Other long term (current) drug therapy: Secondary | ICD-10-CM | POA: Insufficient documentation

## 2014-01-31 DIAGNOSIS — Z87442 Personal history of urinary calculi: Secondary | ICD-10-CM | POA: Insufficient documentation

## 2014-01-31 DIAGNOSIS — Z8739 Personal history of other diseases of the musculoskeletal system and connective tissue: Secondary | ICD-10-CM | POA: Insufficient documentation

## 2014-01-31 DIAGNOSIS — S61209A Unspecified open wound of unspecified finger without damage to nail, initial encounter: Secondary | ICD-10-CM | POA: Insufficient documentation

## 2014-01-31 DIAGNOSIS — F411 Generalized anxiety disorder: Secondary | ICD-10-CM | POA: Insufficient documentation

## 2014-01-31 DIAGNOSIS — G40909 Epilepsy, unspecified, not intractable, without status epilepticus: Secondary | ICD-10-CM | POA: Insufficient documentation

## 2014-01-31 DIAGNOSIS — J45909 Unspecified asthma, uncomplicated: Secondary | ICD-10-CM | POA: Insufficient documentation

## 2014-01-31 DIAGNOSIS — Z8719 Personal history of other diseases of the digestive system: Secondary | ICD-10-CM | POA: Insufficient documentation

## 2014-01-31 DIAGNOSIS — Z8742 Personal history of other diseases of the female genital tract: Secondary | ICD-10-CM | POA: Insufficient documentation

## 2014-01-31 DIAGNOSIS — G43909 Migraine, unspecified, not intractable, without status migrainosus: Secondary | ICD-10-CM | POA: Insufficient documentation

## 2014-01-31 DIAGNOSIS — IMO0002 Reserved for concepts with insufficient information to code with codable children: Secondary | ICD-10-CM

## 2014-01-31 NOTE — ED Provider Notes (Signed)
Medical screening examination/treatment/procedure(s) were performed by non-physician practitioner and as supervising physician I was immediately available for consultation/collaboration.   EKG Interpretation None       Doug SouSam Mischele Detter, MD 01/31/14 2355

## 2014-01-31 NOTE — ED Provider Notes (Addendum)
CSN: [id number]     Arrival date & time 01/31/14  2125 History  This chart was scribed for non-physician practitioner Earley FavorGail Jerilynn Feldmeier working with No att. providers found by Carl Bestelina Holson, ED Scribe. This patient was seen in room WTR7/WTR7 and the patient's care was started at 9:34 PM.   Chief Complaint  Patient presents with  . Finger Injury   The history is provided by the patient. No language interpreter was used.   HPI Comments: [name] is a 28 y.o. female who presents to the Emergency Department complaining of a laceration to her middle finger that she incurred at 1:30 PM this evening after she cut herself with hedge trimmers.  She states that her mother who is an Charity fundraiserN cleaned her wound with peroxide and dressed it with gauze.  She states that when she tried to change the dressing a couple of hours later, the bleeding continues.    Past Medical History  Diagnosis Date  . MIGRAINES, HX OF 06/24/2007  . Gallstones   . IBS (irritable bowel syndrome)     doesn't take any meds  . OCD (obsessive compulsive disorder)     takes Prozac daily  . Anxiety     takes Klonopin nightly  . ASTHMA NOS W/ACUTE EXACERBATION 01/05/2010    exercise induced  . History of migraine     last one a couple of days ago  . Coarse tremors   . Seizures     takes Lamictal daily;last seizure was in 11/14/10  . Degenerative disc disease     back  . Back pain   . GERD (gastroesophageal reflux disease)     but doesn't take anything for it  . Urinary frequency   . Medullary sponge kidney 2007    kidneys can be sick but she isnt sick  . PYELONEPHRITIS 06/24/2007  . History of UTI     frequent  . History of kidney stones   . Anemia     takes Ferrous Sulfate daily   Past Surgical History  Procedure Laterality Date  . Back surgery    . Lumbar laminectomy/decompression microdiscectomy  07/24/2012    Procedure: LUMBAR LAMINECTOMY/DECOMPRESSION MICRODISCECTOMY;  Surgeon: Jacki Conesonald A Gioffre, MD;  Location: [facility];   Service: Orthopedics;  Laterality: Right;  L5-S1  . Colonoscopy    . Cholecystectomy N/A 12/02/2013    Procedure: LAPAROSCOPIC CHOLECYSTECTOMY WITH ATTEMPTED INTRAOPERATIVE CHOLANGIOGRAM;  Surgeon: Atilano InaEric M Wilson, MD;  [facility];  Service: General;  Laterality: N/A;   Family History  Problem Relation Age of Onset  . Anesthesia problems Neg Hx   . Heart disease Mother   . ADD / ADHD Sister    History  Substance Use Topics  . Smoking status: Never Smoker   . Smokeless tobacco: Never Used  . Alcohol Use: Yes     Comment: occas.   OB History   Grav Para Term Preterm Abortions TAB SAB Ect Mult Living   2 2 2  0 0 0 0 0 0 2     Review of Systems  Constitutional: Negative for fever.  Skin: Positive for wound.  Hematological: Does not bruise/bleed easily.  All other systems reviewed and are negative.     Allergies  Other; Ciprofloxacin; Nsaids; Topiramate; Triptans; and Adhesive  Home Medications   Prior to Admission medications   Medication Sig Start Date End Date Taking? Authorizing Provider  clonazePAM (KLONOPIN) 1 MG tablet Take 1 tablet (1 mg total) by mouth at bedtime. 01/19/14  Cleotis NipperSyed T Arfeen, MD  ferrous sulfate 325 (65 FE) MG tablet 325 mg daily with breakfast.     Historical Provider, MD  FLUoxetine (PROZAC) 40 MG capsule Take 1 capsule (40 mg total) by mouth at bedtime. 01/19/14   Cleotis NipperSyed T Arfeen, MD  LamoTRIgine (LAMICTAL XR) 300 MG TB24 Take 1 tablet (300 mg total) by mouth at bedtime. 01/22/14   York Spanielharles K Willis, MD  lamoTRIgine (LAMICTAL) 25 MG tablet 2 tablets in the morning for 2 weeks, then take 4 tablets in the morning 01/22/14   York Spanielharles K Willis, MD  Norethindrone-Ethinyl Estradiol-Fe (GENERESS FE) 0.8-25 MG-MCG tablet Chew 1 tablet by mouth at bedtime.     Historical Provider, MD  ondansetron (ZOFRAN) 4 MG tablet Take one tablet every 6 hours PRN nausea, #15, no refills, generic substitution allowed 12/23/13   Atilano InaEric M Wilson, MD  oxyCODONE-acetaminophen (ROXICET)  5-325 MG per tablet Take 1-2 tablets by mouth every 4 (four) hours as needed for severe pain. 12/02/13   Atilano InaEric M Wilson, MD  vitamin C (ASCORBIC ACID) 500 MG tablet Take 500 mg by mouth daily.    Historical Provider, MD   BP 134/85  Pulse 80  Temp(Src) 98.4 F (36.9 C) (Oral)  Resp 16  SpO2 100%  Physical Exam  Nursing note and vitals reviewed. Constitutional: She is oriented to person, place, and time. She appears well-developed and well-nourished.  HENT:  Head: Normocephalic and atraumatic.  Eyes: EOM are normal.  Neck: Normal range of motion.  Cardiovascular: Normal rate.   Pulmonary/Chest: Effort normal.  Musculoskeletal: Normal range of motion.  Neurological: She is alert and oriented to person, place, and time.  Skin: Skin is warm and dry.  Psychiatric: She has a normal mood and affect. Her behavior is normal.    ED Course  LACERATION REPAIR Date/Time: 02/25/2014 9:43 PM Performed by: Arman FilterSCHULZ, Alpa Salvo K Authorized by: Arman FilterSCHULZ, Samarrah Tranchina K Comments: Wound stop applied    (including critical care time)      COORDINATION OF CARE: 9:37 PM- Discussed applying wound seal to the patient's middle finger and re-wrapping the patient's wound.  The patient agreed to the treatment plan.   Labs Review Labs Reviewed - No data to display  Imaging Review No results found.   EKG Interpretation None      MDM   Final diagnoses:  Laceration       I personally performed the services described in this documentation, which was scribed in my presence. The recorded information has been reviewed and is accurate.   Arman FilterGail K Tian Mcmurtrey, NP 01/31/14 2353  Arman FilterGail K Kenyatte Chatmon, NP 02/25/14 [phone]

## 2014-01-31 NOTE — ED Notes (Signed)
Pt states she cut third finger of L hand with hedge trimmers this afternoon around 1330. Pt states she cleaned the wound and wrapped it in gauze, but it still is bleeding profusely per pt. Pt arrives with dressing to wound. Bleeding controlled. Pt is unsure when she had a tetanus shot last.

## 2014-01-31 NOTE — Discharge Instructions (Signed)
Keep the wound covered until the steri strip fall off naturally in 5-7 days

## 2014-02-24 ENCOUNTER — Telehealth: Payer: Self-pay | Admitting: Neurology

## 2014-02-24 ENCOUNTER — Other Ambulatory Visit: Payer: Self-pay

## 2014-02-24 MED ORDER — LAMICTAL XR 300 MG PO TB24: 300.0000 mg | ORAL_TABLET | Freq: Every day | ORAL | Status: DC

## 2014-02-24 NOTE — Telephone Encounter (Signed)
[name] with [facility], calling requesting new Rx for Lamictal 100 mg that states medically necessary in order for insurance to pay.  Her contact # [phone].

## 2014-02-25 NOTE — Telephone Encounter (Signed)
Rx signed and faxed.

## 2014-03-02 NOTE — ED Provider Notes (Signed)
Medical screening examination/treatment/procedure(s) were performed by non-physician practitioner and as supervising physician I was immediately available for consultation/collaboration.   EKG Interpretation None       Doug Sou, MD 03/02/14 1200

## 2014-03-16 ENCOUNTER — Ambulatory Visit (HOSPITAL_COMMUNITY): Payer: Self-pay | Admitting: Psychiatry

## 2014-03-18 ENCOUNTER — Other Ambulatory Visit (HOSPITAL_COMMUNITY): Payer: Self-pay | Admitting: Psychiatry

## 2014-03-23 ENCOUNTER — Other Ambulatory Visit (HOSPITAL_COMMUNITY): Payer: Self-pay | Admitting: *Deleted

## 2014-03-23 ENCOUNTER — Other Ambulatory Visit (HOSPITAL_COMMUNITY): Payer: Self-pay | Admitting: Psychiatry

## 2014-03-23 DIAGNOSIS — F429 Obsessive-compulsive disorder, unspecified: Secondary | ICD-10-CM

## 2014-03-23 MED ORDER — CLONAZEPAM 1 MG PO TABS: ORAL_TABLET | ORAL | Status: DC

## 2014-03-23 NOTE — Telephone Encounter (Signed)
RX for Clonazepam was printed in error. RX was called to pharmacy 6/29 @ 1025

## 2014-03-31 ENCOUNTER — Other Ambulatory Visit (HOSPITAL_COMMUNITY): Payer: Self-pay | Admitting: *Deleted

## 2014-03-31 DIAGNOSIS — F429 Obsessive-compulsive disorder, unspecified: Secondary | ICD-10-CM

## 2014-03-31 MED ORDER — FLUOXETINE HCL 40 MG PO CAPS: 40.0000 mg | ORAL_CAPSULE | Freq: Every day | ORAL | Status: DC

## 2014-03-31 NOTE — Telephone Encounter (Signed)
Refill of Prozac [facility] to [facility] Contacted patient to let her know RX sent and remind to make follow up appt

## 2014-04-04 ENCOUNTER — Emergency Department (HOSPITAL_COMMUNITY)
Admission: EM | Admit: 2014-04-04 | Discharge: 2014-04-04 | Disposition: A | Discharge status: Home / Self Care | Payer: Medicaid Other | Attending: Emergency Medicine | Admitting: Emergency Medicine

## 2014-04-04 ENCOUNTER — Encounter (HOSPITAL_COMMUNITY): Payer: Self-pay | Admitting: Emergency Medicine

## 2014-04-04 ENCOUNTER — Emergency Department (HOSPITAL_COMMUNITY): Payer: Medicaid Other

## 2014-04-04 DIAGNOSIS — G40909 Epilepsy, unspecified, not intractable, without status epilepticus: Secondary | ICD-10-CM | POA: Diagnosis not present

## 2014-04-04 DIAGNOSIS — G43909 Migraine, unspecified, not intractable, without status migrainosus: Secondary | ICD-10-CM | POA: Diagnosis not present

## 2014-04-04 DIAGNOSIS — Z79899 Other long term (current) drug therapy: Secondary | ICD-10-CM | POA: Diagnosis not present

## 2014-04-04 DIAGNOSIS — R109 Unspecified abdominal pain: Secondary | ICD-10-CM | POA: Insufficient documentation

## 2014-04-04 DIAGNOSIS — J45909 Unspecified asthma, uncomplicated: Secondary | ICD-10-CM | POA: Insufficient documentation

## 2014-04-04 DIAGNOSIS — D649 Anemia, unspecified: Secondary | ICD-10-CM | POA: Insufficient documentation

## 2014-04-04 DIAGNOSIS — Z8739 Personal history of other diseases of the musculoskeletal system and connective tissue: Secondary | ICD-10-CM | POA: Diagnosis not present

## 2014-04-04 DIAGNOSIS — Z87440 Personal history of urinary (tract) infections: Secondary | ICD-10-CM | POA: Diagnosis not present

## 2014-04-04 DIAGNOSIS — E669 Obesity, unspecified: Secondary | ICD-10-CM | POA: Diagnosis not present

## 2014-04-04 DIAGNOSIS — F411 Generalized anxiety disorder: Secondary | ICD-10-CM | POA: Diagnosis not present

## 2014-04-04 DIAGNOSIS — Z8719 Personal history of other diseases of the digestive system: Secondary | ICD-10-CM | POA: Diagnosis not present

## 2014-04-04 DIAGNOSIS — Z87448 Personal history of other diseases of urinary system: Secondary | ICD-10-CM | POA: Insufficient documentation

## 2014-04-04 DIAGNOSIS — Z87442 Personal history of urinary calculi: Secondary | ICD-10-CM | POA: Diagnosis not present

## 2014-04-04 LAB — CBC WITH DIFFERENTIAL/PLATELET
Basophils Absolute: 0 10*3/uL (ref 0.0–0.1)
Basophils Relative: 0 % (ref 0–1)
EOS ABS: 0.1 10*3/uL (ref 0.0–0.7)
EOS PCT: 2 % (ref 0–5)
HCT: 36.7 % (ref 36.0–46.0)
Hemoglobin: 12.3 g/dL (ref 12.0–15.0)
Lymphocytes Relative: 25 % (ref 12–46)
Lymphs Abs: 2.2 10*3/uL (ref 0.7–4.0)
MCH: 29.1 pg (ref 26.0–34.0)
MCHC: 33.5 g/dL (ref 30.0–36.0)
MCV: 87 fL (ref 78.0–100.0)
Monocytes Absolute: 0.5 10*3/uL (ref 0.1–1.0)
Monocytes Relative: 5 % (ref 3–12)
NEUTROS PCT: 68 % (ref 43–77)
Neutro Abs: 6.1 10*3/uL (ref 1.7–7.7)
PLATELETS: 278 10*3/uL (ref 150–400)
RBC: 4.22 MIL/uL (ref 3.87–5.11)
RDW: 12.3 % (ref 11.5–15.5)
WBC: 8.9 10*3/uL (ref 4.0–10.5)

## 2014-04-04 LAB — URINALYSIS, ROUTINE W REFLEX MICROSCOPIC
Glucose, UA: NEGATIVE mg/dL
Hgb urine dipstick: NEGATIVE
KETONES UR: NEGATIVE mg/dL
Leukocytes, UA: NEGATIVE
Nitrite: NEGATIVE
Protein, ur: NEGATIVE mg/dL
SPECIFIC GRAVITY, URINE: 1.036 — AB (ref 1.005–1.030)
UROBILINOGEN UA: 0.2 mg/dL (ref 0.0–1.0)
pH: 5 (ref 5.0–8.0)

## 2014-04-04 LAB — BASIC METABOLIC PANEL
Anion gap: 13 (ref 5–15)
BUN: 13 mg/dL (ref 6–23)
CALCIUM: 9.3 mg/dL (ref 8.4–10.5)
CO2: 24 mEq/L (ref 19–32)
Chloride: 103 mEq/L (ref 96–112)
Creatinine, Ser: 0.84 mg/dL (ref 0.50–1.10)
Glucose, Bld: 95 mg/dL (ref 70–99)
POTASSIUM: 3.7 meq/L (ref 3.7–5.3)
SODIUM: 140 meq/L (ref 137–147)

## 2014-04-04 LAB — HCG, SERUM, QUALITATIVE: PREG SERUM: NEGATIVE

## 2014-04-04 MED ORDER — MORPHINE SULFATE 4 MG/ML IJ SOLN
4.0000 mg | INTRAMUSCULAR | Status: DC | PRN
  Administered 2014-04-04: 4 mg via INTRAVENOUS
  Filled 2014-04-04: qty 1

## 2014-04-04 MED ORDER — SODIUM CHLORIDE 0.9 % IV BOLUS (SEPSIS)
500.0000 mL | Freq: Once | INTRAVENOUS | Status: AC
  Administered 2014-04-04: 500 mL via INTRAVENOUS

## 2014-04-04 MED ORDER — OXYBUTYNIN CHLORIDE ER 5 MG PO TB24: 5.0000 mg | ORAL_TABLET | Freq: Every day | ORAL | Status: DC

## 2014-04-04 MED ORDER — OXYBUTYNIN CHLORIDE 5 MG PO TABS
5.0000 mg | ORAL_TABLET | Freq: Three times a day (TID) | ORAL | Status: DC
  Administered 2014-04-04: 5 mg via ORAL
  Filled 2014-04-04: qty 1

## 2014-04-04 MED ORDER — DIAZEPAM 5 MG/ML IJ SOLN: 5.0000 mg | Freq: Once | INTRAMUSCULAR | Status: DC

## 2014-04-04 NOTE — ED Notes (Signed)
Pt aware of need of urine sample. States she feels dehydrated and unable to urinate

## 2014-04-04 NOTE — ED Provider Notes (Signed)
CSN: [id number]     Arrival date & time 04/04/14  1545 History   First MD Initiated Contact with Patient 04/04/14 1730     Chief Complaint  Patient presents with  . Flank Pain     (Consider location/radiation/quality/duration/timing/severity/associated sxs/prior Treatment) HPI   [name] is a 28 y.o. female past medical history significant for IBS, anxiety, OCD, seizure, chronic back pain complaining of severe bilateral flank pain onset one week ago preceded by bladder spasms onset to go patient states today she feels like her kidneys are "burning.". Past medical history of medullary sponge kidney (followed by nephrologist Dr. Meyer Cory) patient says her urine is foul-smelling and she has reduced urine output. She denies dysuria, hematuria. States she history of frequent kidney stones but never required intervention to pass. Patient states she is urinated on herself 3 times in the last 2 weeks because she does not sense that she has the urge to urinate. Has a history of 2 lumbar spinal fusions. She denies saddle anesthesia, weakness, numbness, increasing back pain.    Past Medical History  Diagnosis Date  . MIGRAINES, HX OF 06/24/2007  . Gallstones   . IBS (irritable bowel syndrome)     doesn't take any meds  . OCD (obsessive compulsive disorder)     takes Prozac daily  . Anxiety     takes Klonopin nightly  . ASTHMA NOS W/ACUTE EXACERBATION 01/05/2010    exercise induced  . History of migraine     last one a couple of days ago  . Coarse tremors   . Seizures     takes Lamictal daily;last seizure was in 11/14/10  . Degenerative disc disease     back  . Back pain   . GERD (gastroesophageal reflux disease)     but doesn't take anything for it  . Urinary frequency   . Medullary sponge kidney 2007    kidneys can be sick but she isnt sick  . PYELONEPHRITIS 06/24/2007  . History of UTI     frequent  . History of kidney stones   . Anemia     takes Ferrous Sulfate daily   Past  Surgical History  Procedure Laterality Date  . Back surgery    . Lumbar laminectomy/decompression microdiscectomy  07/24/2012    Procedure: LUMBAR LAMINECTOMY/DECOMPRESSION MICRODISCECTOMY;  Surgeon: Jacki Cones, MD;  Location: [facility];  Service: Orthopedics;  Laterality: Right;  L5-S1  . Colonoscopy    . Cholecystectomy N/A 12/02/2013    Procedure: LAPAROSCOPIC CHOLECYSTECTOMY WITH ATTEMPTED INTRAOPERATIVE CHOLANGIOGRAM;  Surgeon: Atilano Ina, MD;  Location: [facility] OR;  Service: General;  Laterality: N/A;   Family History  Problem Relation Age of Onset  . Anesthesia problems Neg Hx   . Heart disease Mother   . ADD / ADHD Sister    History  Substance Use Topics  . Smoking status: Never Smoker   . Smokeless tobacco: Never Used  . Alcohol Use: Yes     Comment: occas.   OB History   Grav Para Term Preterm Abortions TAB SAB Ect Mult Living   2 2 2  0 0 0 0 0 0 2     Review of Systems  10 systems reviewed and found to be negative, except as noted in the HPI.   Allergies  Other; Ciprofloxacin; Nsaids; Topiramate; Triptans; and Adhesive  Home Medications   Prior to Admission medications   Medication Sig Start Date End Date Taking? Authorizing Provider  clonazePAM (KLONOPIN) 1 MG tablet TAKE  1 TABLET BY MOUTH AT BEDTIME 03/23/14  Yes Cleotis Nipper, MD  FLUoxetine (PROZAC) 40 MG capsule Take 1 capsule (40 mg total) by mouth at bedtime. 03/31/14  Yes Cleotis Nipper, MD  gabapentin (NEURONTIN) 300 MG capsule Take 300 mg by mouth daily.   Yes Historical Provider, MD  LamoTRIgine (LAMICTAL XR) 300 MG TB24 Take 300 mg by mouth daily. 02/24/14  Yes Carmen Dohmeier, MD  Norethindrone-Ethinyl Estradiol-Fe (GENERESS FE) 0.8-25 MG-MCG tablet Chew 1 tablet by mouth at bedtime.    Yes Historical Provider, MD  oxybutynin (DITROPAN XL) 5 MG 24 hr tablet Take 1 tablet (5 mg total) by mouth at bedtime. 04/04/14   Goldie Dimmer, PA-C   BP 123/65  Pulse 74  Temp(Src) 98.5 F (36.9 C) (Oral)  Resp  20  SpO2 98%  LMP 03/28/2014 Physical Exam  Nursing note and vitals reviewed. Constitutional: She is oriented to person, place, and time. She appears well-developed and well-nourished. No distress.  Obese, tearful  HENT:  Head: Normocephalic.  Mouth/Throat: Oropharynx is clear and moist.  Eyes: Conjunctivae and EOM are normal. Pupils are equal, round, and reactive to light.  Neck: Normal range of motion.  Cardiovascular: Normal rate, regular rhythm and intact distal pulses.   Pulmonary/Chest: Effort normal and breath sounds normal. No stridor. No respiratory distress. She has no wheezes. She has no rales. She exhibits no tenderness.  Abdominal: Soft. Bowel sounds are normal. She exhibits no distension and no mass. There is no tenderness. There is no rebound and no guarding.  Genitourinary:  Exquisite bilateral CVA tenderness  Musculoskeletal: Normal range of motion.  Hole scar to the lumbar region patient states she has had 2 fusions by [facility] orthopedics  Neurological: She is alert and oriented to person, place, and time.  Psychiatric: She has a normal mood and affect.    ED Course  Procedures (including critical care time) Labs Review Labs Reviewed  URINALYSIS, ROUTINE W REFLEX MICROSCOPIC - Abnormal; Notable for the following:    Color, Urine AMBER (*)    APPearance CLOUDY (*)    Specific Gravity, Urine 1.036 (*)    Bilirubin Urine SMALL (*)    All other components within normal limits  CBC WITH DIFFERENTIAL  BASIC METABOLIC PANEL  HCG, SERUM, QUALITATIVE    Imaging Review Ct Abdomen Pelvis Wo Contrast  04/04/2014   CLINICAL DATA:  Bilateral flank pain.  EXAM: CT ABDOMEN AND PELVIS WITHOUT CONTRAST  TECHNIQUE: Multidetector CT imaging of the abdomen and pelvis was performed following the standard protocol without IV contrast.  COMPARISON:  None.  FINDINGS: Mild dependent atelectasis in the lung bases.  Kidneys appear symmetrical in size and shape. There is a 2 mm stone  in the mid upper pole of the left kidney. No other renal, ureteral, or bladder stones are identified. No hydronephrosis or hydroureter. No bladder wall thickening.  Surgical absence of the gallbladder. The unenhanced appearance of the liver, spleen, pancreas, adrenal glands, abdominal aorta, inferior vena cava, and retroperitoneal lymph nodes is unremarkable. Stomach, small bowel, and colon are decompressed. No free air or free fluid in the abdomen  Pelvis: The appendix is normal. No free or loculated pelvic fluid collections. Uterus and ovaries are not enlarged. No pelvic mass or lymphadenopathy. No changes of diverticulitis. No destructive bone lesions appreciated. Calcified phleboliths in the pelvis.  IMPRESSION: 2 mm nonobstructing stone in the left kidney. No ureteral stone or obstruction demonstrated.   Electronically Signed   By: Burman Nieves  M.D.   On: 04/04/2014 22:14     EKG Interpretation None      MDM   Final diagnoses:  Flank pain    Filed Vitals:   04/04/14 1637 04/04/14 2040 04/04/14 2243  BP: 142/92 162/90 123/65  Pulse: 104 86 74  Temp: 98.5 F (36.9 C)    TempSrc: Oral    Resp: 16 16 20   SpO2: 100%  98%    Medications  morphine 4 MG/ML injection 4 mg (4 mg Intravenous Given 04/04/14 1917)  oxybutynin (DITROPAN) tablet 5 mg (5 mg Oral Given 04/04/14 2107)  sodium chloride 0.9 % bolus 500 mL (500 mLs Intravenous New Bag/Given 04/04/14 1914)    [place] [name] is a 28 y.o. female presenting with bilateral flank pain described as burning and bladder spasm. Patient also reports several episodes of urinary incontinence. Patient with normal post void residual. Doubt cauda equina. Blood work, urinalysis, stone protocol CT without acute abnormality. Will start patient on Ditropan and ask her to follow with urology.  Evaluation does not show pathology that would require ongoing emergent intervention or inpatient treatment. Pt is hemodynamically stable and mentating appropriately.  Discussed findings and plan with patient/guardian, who agrees with care plan. All questions answered. Return precautions discussed and outpatient follow up given.   Discharge Medication List as of 04/04/2014 10:19 PM    START taking these medications   Details  oxybutynin (DITROPAN XL) 5 MG 24 hr tablet Take 1 tablet (5 mg total) by mouth at bedtime., Starting 04/04/2014, Until Discontinued, [employer]             Sukaina Toothaker, PA-C 04/05/14 [id number]

## 2014-04-04 NOTE — Discharge Instructions (Signed)
Please follow with your primary care doctor in the next 2 days for a check-up. They must obtain records for further management.   Do not hesitate to return to the Emergency Department for any new, worsening or concerning symptoms.    Flank Pain Flank pain refers to pain that is located on the side of the body between the upper abdomen and the back. The pain may occur over a short period of time (acute) or may be long-term or reoccurring (chronic). It may be mild or severe. Flank pain can be caused by many things. CAUSES  Some of the more common causes of flank pain include:  Muscle strains.   Muscle spasms.   A disease of your spine (vertebral disk disease).   A lung infection (pneumonia).   Fluid around your lungs (pulmonary edema).   A kidney infection.   Kidney stones.   A very painful skin rash caused by the chickenpox virus (shingles).   Gallbladder disease.  HOME CARE INSTRUCTIONS  Home care will depend on the cause of your pain. In general,  Rest as directed by your caregiver.  Drink enough fluids to keep your urine clear or pale yellow.  Only take over-the-counter or prescription medicines as directed by your caregiver. Some medicines may help relieve the pain.  Tell your caregiver about any changes in your pain.  Follow up with your caregiver as directed. SEEK IMMEDIATE MEDICAL CARE IF:   Your pain is not controlled with medicine.   You have new or worsening symptoms.  Your pain increases.   You have abdominal pain.   You have shortness of breath.   You have persistent nausea or vomiting.   You have swelling in your abdomen.   You feel faint or pass out.   You have blood in your urine.  You have a fever or persistent symptoms for more than 2-3 days.  You have a fever and your symptoms suddenly get worse. MAKE SURE YOU:   Understand these instructions.  Will watch your condition.  Will get help right away if you are not doing  well or get worse. Document Released: 11/02/2005 Document Revised: 06/05/2012 Document Reviewed: 04/25/2012 [facility] Patient Information 2015 [place], [place]. This information is not intended to replace advice given to you by your health care provider. Make sure you discuss any questions you have with your health care provider.

## 2014-04-04 NOTE — ED Notes (Signed)
Pt c/o bilateral flank pain x 1 wk, worsening today.  States hx of medullary sponge kidney.  States that she is having bladder spasms 2 wks ago.

## 2014-04-05 NOTE — ED Provider Notes (Signed)
Medical screening examination/treatment/procedure(s) were performed by non-physician practitioner and as supervising physician I was immediately available for consultation/collaboration.   EKG Interpretation None        Naomie Crow H Bronnie Vasseur, MD 04/05/14 1451 

## 2014-04-14 ENCOUNTER — Telehealth: Payer: Self-pay | Admitting: *Deleted

## 2014-04-14 ENCOUNTER — Encounter (HOSPITAL_COMMUNITY): Payer: Self-pay | Admitting: Psychiatry

## 2014-04-14 ENCOUNTER — Ambulatory Visit (INDEPENDENT_AMBULATORY_CARE_PROVIDER_SITE_OTHER): Payer: Medicaid Other | Admitting: Psychiatry

## 2014-04-14 VITALS — BP 137/75 | HR 80 | Ht 60.0 in | Wt 195.4 lb

## 2014-04-14 DIAGNOSIS — F429 Obsessive-compulsive disorder, unspecified: Secondary | ICD-10-CM

## 2014-04-14 MED ORDER — FLUOXETINE HCL 40 MG PO CAPS: 40.0000 mg | ORAL_CAPSULE | Freq: Every day | ORAL | Status: DC

## 2014-04-14 MED ORDER — CLONAZEPAM 1 MG PO TABS: ORAL_TABLET | ORAL | Status: DC

## 2014-04-14 NOTE — Addendum Note (Signed)
Addended by: Kathryne SharperARFEEN, Addaleigh Nicholls T on: 04/14/2014 04:53 PM   Modules accepted: Level of Service

## 2014-04-14 NOTE — Progress Notes (Signed)
[facility] [phone] Progress Note  [name] [id number] 28 y.o.  04/14/2014 4:27 PM  Chief Complaint:  I had kidney pain.  I was in the emergency room.         History of Present Illness:  [name] gave for her followup appointment.  She is taking her Klonopin and Prozac.  Recently she visited the emergency room because of kidney pain.  The patient was disappointed because she was not given any medication and finally she was given medication from her primary care physician for nausea .  She is feeling better now.  She is concerned about her weight gain.  She has gained more than 10 pound in past few months.  She is taking Neurontin for her headaches.  She mentioned her headaches is getting better but she is concerned about the weight gain.  She is relieved that her panic attack is under control.  She is sleeping better but sometimes she is sleeping too much during the day.  She denies any hallucination, paranoia or any suicidal thoughts.  Last month she married and she is very happy with the husband.  Patient mentioned that relationship is going very well.  She also mentioned that her OCD symptoms are under control.  She denies any dizziness, tremors, shakes or any agitation or hallucination.  She is going to start college next month .  She is enrolled in [employer] and doing online courses.  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  She still had some attention and focus issue and she is concerned if she able to be attention during her classes.  She is wondering if she can take anything to help her focus and attention.  She still drinks alcohol on occasions but denies any binge drinking.  Her vitals are stable other than she has weight gain.  She is currently not working.    Suicidal Ideation: No Plan Formed: No Patient has means to carry out plan: No  Homicidal Ideation: No Plan Formed: No Patient has means to carry out plan: No  Past Psychiatric  History/Hospitalization(s) Patient endorsed history of depression and anxiety symptoms after the first child born.  She has symptoms of ADD however she has no problem in school.  She always get better grades.  She was never tested for ADHD.  She also has history of rituals, obsessions and compulsions.  She endorsed history of anxiety nervousness and trust issue.  She denies any history of mania, psychosis, paranoia or any hallucination.  She denies any history of suicidal attempt, self abusive behavior and aggression.   Anxiety: Yes Bipolar Disorder: No Depression: Yes Mania: No Psychosis: No Schizophrenia: No Personality Disorder: No Hospitalization for psychiatric illness: No History of Electroconvulsive Shock Therapy: No Prior Suicide Attempts: No  Medical History; Patient has seizure disorder.  She started having grand mal seizures at early age.  She denies any history of head trauma.  Patient also has medullary sponge kidney however her kidney function test are all normal.  She is seeing Dr. [name].  She has history of migraine headaches.  Recently she visited the emergency room for a kidney pain.  Review of Systems: Psychiatric: Agitation: No Hallucination: No Depressed Mood: No Insomnia: No Hypersomnia: Yes Altered Concentration: No Feels Worthless: No Grandiose Ideas: No Belief In Special Powers: No New/Increased Substance Abuse: No Compulsions: Yes  Neurologic: Headache: Yes Seizure: Yes Paresthesias: No    Outpatient Encounter Prescriptions as of 04/14/2014  Medication Sig  . clonazePAM (KLONOPIN)  1 MG tablet TAKE 1 TABLET BY MOUTH AT BEDTIME  . FLUoxetine (PROZAC) 40 MG capsule Take 1 capsule (40 mg total) by mouth at bedtime.  . gabapentin (NEURONTIN) 300 MG capsule Take 300 mg by mouth daily.  . LamoTRIgine (LAMICTAL XR) 300 MG TB24 Take 300 mg by mouth daily.  . Norethindrone-Ethinyl Estradiol-Fe (GENERESS FE) 0.8-25 MG-MCG tablet Chew 1 tablet by mouth at  bedtime.   . [DISCONTINUED] clonazePAM (KLONOPIN) 1 MG tablet TAKE 1 TABLET BY MOUTH AT BEDTIME  . [DISCONTINUED] FLUoxetine (PROZAC) 40 MG capsule Take 1 capsule (40 mg total) by mouth at bedtime.  . [DISCONTINUED] oxybutynin (DITROPAN XL) 5 MG 24 hr tablet Take 1 tablet (5 mg total) by mouth at bedtime.    Recent Results (from the past 2160 hour(s))  LAMOTRIGINE LEVEL     Status: Abnormal   Collection Time    01/20/14  9:12 AM      Result Value Ref Range   Lamotrigine Lvl 1.0 (*) 2.0 - 20.0 ug/mL   Comment:                                 Detection Limit = 1.0  CBC WITH DIFFERENTIAL     Status: None   Collection Time    04/04/14  6:53 PM      Result Value Ref Range   WBC 8.9  4.0 - 10.5 K/uL   RBC 4.22  3.87 - 5.11 MIL/uL   Hemoglobin 12.3  12.0 - 15.0 g/dL   HCT 36.7  36.0 - 46.0 %   MCV 87.0  78.0 - 100.0 fL   MCH 29.1  26.0 - 34.0 pg   MCHC 33.5  30.0 - 36.0 g/dL   RDW 12.3  11.5 - 15.5 %   Platelets 278  150 - 400 K/uL   Neutrophils Relative % 68  43 - 77 %   Neutro Abs 6.1  1.7 - 7.7 K/uL   Lymphocytes Relative 25  12 - 46 %   Lymphs Abs 2.2  0.7 - 4.0 K/uL   Monocytes Relative 5  3 - 12 %   Monocytes Absolute 0.5  0.1 - 1.0 K/uL   Eosinophils Relative 2  0 - 5 %   Eosinophils Absolute 0.1  0.0 - 0.7 K/uL   Basophils Relative 0  0 - 1 %   Basophils Absolute 0.0  0.0 - 0.1 K/uL  BASIC METABOLIC PANEL     Status: None   Collection Time    04/04/14  6:53 PM      Result Value Ref Range   Sodium 140  137 - 147 mEq/L   Potassium 3.7  3.7 - 5.3 mEq/L   Chloride 103  96 - 112 mEq/L   CO2 24  19 - 32 mEq/L   Glucose, Bld 95  70 - 99 mg/dL   BUN 13  6 - 23 mg/dL   Creatinine, Ser 0.84  0.50 - 1.10 mg/dL   Calcium 9.3  8.4 - 10.5 mg/dL   GFR calc non Af Amer >90  >90 mL/min   GFR calc Af Amer >90  >90 mL/min   Comment: (NOTE)     The eGFR has been calculated using the CKD EPI equation.     This calculation has not been validated in all clinical situations.     eGFR's  persistently <90 mL/min signify possible Chronic Kidney  Disease.   Anion gap 13  5 - 15  HCG, SERUM, QUALITATIVE     Status: None   Collection Time    04/04/14  6:53 PM      Result Value Ref Range   Preg, Serum NEGATIVE  NEGATIVE   Comment:            THE SENSITIVITY OF THIS     METHODOLOGY IS >10 mIU/mL.  URINALYSIS, ROUTINE W REFLEX MICROSCOPIC     Status: Abnormal   Collection Time    04/04/14  7:07 PM      Result Value Ref Range   Color, Urine AMBER (*) YELLOW   Comment: BIOCHEMICALS MAY BE AFFECTED BY COLOR   APPearance CLOUDY (*) CLEAR   Specific Gravity, Urine 1.036 (*) 1.005 - 1.030   pH 5.0  5.0 - 8.0   Glucose, UA NEGATIVE  NEGATIVE mg/dL   Hgb urine dipstick NEGATIVE  NEGATIVE   Bilirubin Urine SMALL (*) NEGATIVE   Ketones, ur NEGATIVE  NEGATIVE mg/dL   Protein, ur NEGATIVE  NEGATIVE mg/dL   Urobilinogen, UA 0.2  0.0 - 1.0 mg/dL   Nitrite NEGATIVE  NEGATIVE   Leukocytes, UA NEGATIVE  NEGATIVE   Comment: MICROSCOPIC NOT DONE ON URINES WITH NEGATIVE PROTEIN, BLOOD, LEUKOCYTES, NITRITE, OR GLUCOSE <1000 mg/dL.      Physical Exam: Constitutional:  BP 137/75  Pulse 80  Ht 5' (1.524 m)  Wt 195 lb 6.4 oz (88.633 kg)  BMI 38.16 kg/m2  LMP 03/28/2014  Musculoskeletal: Strength & Muscle Tone: within normal limits Gait & Station: normal Patient leans: N/A  Mental Status Examination;  Patient is a young female who is overweight, casually dressed and groomed.  She appears anxious but cooperative.  Her speech is clear and coherent.  Her thought processes logical and goal-directed.  She described her mood anxious and her affect is constricted.  She denies any active or passive suicidal thoughts or homicidal thoughts she denies any auditory or visual hallucination.  There were no delusions. Her attention and concentration is fair.  She gets easily distracted.  She is alert and oriented x3.  Her insight judgment and impulse control is okay.   New problem, with  additional work up planned, Review of Psycho-Social Stressors (1), Review or order clinical lab tests (1), Review and summation of old records (2), Review of Last Therapy Session (1) and Review of Medication Regimen & Side Effects (2)  Assessment: Axis I: Obsessive-compulsive disorder, anxiety disorder NOS, ADHD  Axis II: Deferred  Axis III:  Past Medical History  Diagnosis Date  . MIGRAINES, HX OF 06/24/2007  . Gallstones   . IBS (irritable bowel syndrome)     doesn't take any meds  . OCD (obsessive compulsive disorder)     takes Prozac daily  . Anxiety     takes Klonopin nightly  . ASTHMA NOS W/ACUTE EXACERBATION 01/05/2010    exercise induced  . History of migraine     last one a couple of days ago  . Coarse tremors   . Seizures     takes Lamictal daily;last seizure was in 11/14/10  . Degenerative disc disease     back  . Back pain   . GERD (gastroesophageal reflux disease)     but doesn't take anything for it  . Urinary frequency   . Medullary sponge kidney 2007    kidneys can be sick but she isnt sick  . PYELONEPHRITIS 06/24/2007  . History of UTI  frequent  . History of kidney stones   . Anemia     takes Ferrous Sulfate daily    Axis IV: Moderate   Plan:  I reviewed records , blood results and her current medication.  She has sleep studies but she was told that she does not have any sleep apnea.  She is taking Klonopin 1 mg at bedtime and Prozac 40 mg.  Her panic attack is under control.  She has gained weight from the past which could be due to Neurontin .  I recommended to discuss with her neurologist about changing her headache medication .  Patient is requesting something to help her attention and focus.  I'm concerned about her seizures .  She has history of seizures and any stimulant may cause precipitation of seizures.  I recommended to have her neurologist review her medication and if her neurologist is okay then we can consider Wellbutrin .  However given  the history of seizures it should be coming from her neurologist.  At this time of anxiety and depression is under control with Prozac and Klonopin.  Discussed weight loss program including watching her calorie intake and diet.  Recommended regular exercise.  I will see her again in 2 months.  ime spent 25 minutes.  More than 50% of the time spent in psychoeducation, counseling and coordination of care.  Discuss safety plan that anytime having active suicidal thoughts or homicidal thoughts then patient need to call 911 or go to the local emergency room.   Areliz Rothman T., MD 04/14/2014

## 2014-04-15 ENCOUNTER — Telehealth (HOSPITAL_COMMUNITY): Payer: Self-pay

## 2014-04-15 NOTE — Telephone Encounter (Signed)
I called.  Relayed Dr Dohmeier's message.  She will speak with other provider and see if there is a different option they would prefer that is not contraindicated with seizure patients and will call us back if needed.

## 2014-04-15 NOTE — Telephone Encounter (Signed)
Please advise 

## 2014-04-15 NOTE — Telephone Encounter (Signed)
I called the patient back.  She said her Psychiatrist wants to verify Dr Vickey Hugerohmeier would be okay with the patient starting Wellbutrin.  Please advise.  Thank you.

## 2014-04-15 NOTE — Telephone Encounter (Signed)
Wellbutrin in a patient with seizures is contraindicated.

## 2014-04-16 ENCOUNTER — Telehealth (HOSPITAL_COMMUNITY): Payer: Self-pay | Admitting: Psychiatry

## 2014-04-16 ENCOUNTER — Telehealth: Payer: Self-pay | Admitting: Neurology

## 2014-04-16 NOTE — Telephone Encounter (Signed)
Patient's psychiatrist would like a recommendation on a medication that will help her to focus but will not cause seizures as Wellbutrin was declined. Please call to advise.

## 2014-04-16 NOTE — Telephone Encounter (Signed)
I returned patient's phone call I left a message. 

## 2014-04-16 NOTE — Telephone Encounter (Signed)
Patient call us back and told that Wellbutrin is not recommended by her neurologist because of history of seizures.

## 2014-04-17 NOTE — Telephone Encounter (Signed)
The psychiatrist name is Dr. Kathryne SharperSyed Arfeen [facility], telephone number is [phone].  Dr. Lolly MustacheArfeen is out of the office until Monday and the patient called their office on yesterday.  His assistant printed off the note and will give to him on Monday to be addressed.  DS, CMA

## 2014-04-17 NOTE — Telephone Encounter (Signed)
Vyvanse for focus and  alertness . Provigil/ NUVIGIL - if not anxious and not having panic attacks, for patient with morning fatigue and sleepiness.   Both have not shown to lower the seizure threshold.  Who is the psychiatrist and what is his/ her number so we can directly message them ?  Porfirio Mylararmen Anjani Feuerborn

## 2014-04-24 ENCOUNTER — Other Ambulatory Visit (HOSPITAL_COMMUNITY): Payer: Self-pay | Admitting: Psychiatry

## 2014-04-24 ENCOUNTER — Telehealth: Payer: Self-pay | Admitting: Neurology

## 2014-04-24 ENCOUNTER — Telehealth (HOSPITAL_COMMUNITY): Payer: Self-pay

## 2014-04-24 DIAGNOSIS — F429 Obsessive-compulsive disorder, unspecified: Secondary | ICD-10-CM

## 2014-04-24 MED ORDER — MODAFINIL 100 MG PO TABS: 100.0000 mg | ORAL_TABLET | Freq: Every day | ORAL | Status: DC

## 2014-04-24 NOTE — Telephone Encounter (Signed)
[name], husband picked up prescription on 04/24/14 DL [id number]  dlo

## 2014-04-24 NOTE — Telephone Encounter (Signed)
Gave message to Dr Vickey Hugerohmeier

## 2014-04-24 NOTE — Telephone Encounter (Signed)
Called Dr. Sheela StackArfeen's office and the voicemail system came on, left a detailed message for Dr. Lolly MustacheArfeen to text Dr. Vickey Hugerohmeier about the patient.

## 2014-04-24 NOTE — Telephone Encounter (Signed)
Please give dr Lolly Mustachearfeen my cell phone number and he can text me at his convenience.  [phone]

## 2014-04-27 ENCOUNTER — Telehealth (HOSPITAL_COMMUNITY): Payer: Self-pay | Admitting: *Deleted

## 2014-04-27 NOTE — Telephone Encounter (Signed)
Initiated Prior Authorization through [employer] for [employer] and Company[employer]igil Information given - will take 24- 36 hours Can call for update with PA # [id number]  Notified patient of status

## 2014-04-27 NOTE — Telephone Encounter (Signed)
Patient left ZO:[id number]:Lost the prescription she was given at appointment last week. Can she get a new one?

## 2014-04-28 ENCOUNTER — Telehealth: Payer: Self-pay | Admitting: Neurology

## 2014-04-28 ENCOUNTER — Telehealth (HOSPITAL_COMMUNITY): Payer: Self-pay

## 2014-04-28 MED ORDER — CLONAZEPAM 1 MG PO TABS: ORAL_TABLET | ORAL | Status: DC

## 2014-04-28 NOTE — Telephone Encounter (Signed)
I returned patient's phone call.  She lost the prescription of Klonopin.  Explained that Klonopin is a controlled substance and she should be careful about benzodiazepine prescriptions.  We will give her a new prescription one time.  I also discussed about neurologist recommendation. Earlier today I talked to her neurologist Dr Wilford Sports about sleep studies and she told me that patient was recommended to do HLA studies in April which has never done.  I reinforced patient to have these tests done before we can start any stimulants.  Patient acknowledged.

## 2014-04-28 NOTE — Telephone Encounter (Signed)
Patient requesting a return call, questioning testing for Narcolepsy.  Please return call anytime and if not available can leave message on vm..Thanks

## 2014-04-29 NOTE — Telephone Encounter (Signed)
Patient was schedule for her Narcolepsy labs to be done on Monday, August 10 at 4:00 pm.

## 2014-04-30 ENCOUNTER — Encounter (HOSPITAL_COMMUNITY): Payer: Self-pay | Admitting: Psychology

## 2014-04-30 ENCOUNTER — Telehealth (HOSPITAL_COMMUNITY): Payer: Self-pay

## 2014-04-30 DIAGNOSIS — F429 Obsessive-compulsive disorder, unspecified: Secondary | ICD-10-CM

## 2014-04-30 NOTE — Telephone Encounter (Signed)
Noted  

## 2014-04-30 NOTE — Progress Notes (Signed)
[name] is a 28 y.o. female patient being discharged from counseling due to not being active in counseling.  Outpatient Therapist Discharge Summary  [name]    07/12/1986   Admission Date: 07/29/13   Discharge Date:  04/30/14 Reason for Discharge:  Didn't f/u w/ counseling Diagnosis:  OCD (obsessive compulsive disorder)   Comments:  Pt didn't return following admission, 2 no shows. Pt will continue w/ Dr. Lolly MustacheArfeen.   [name]           [name], LPC

## 2014-04-30 NOTE — Telephone Encounter (Signed)
04/30/14 4:50pm Patient came and pick-up rx script [id number].Marland Kitchen.Marguerite Olea/sh

## 2014-05-01 ENCOUNTER — Telehealth (HOSPITAL_COMMUNITY): Payer: Self-pay

## 2014-05-03 ENCOUNTER — Other Ambulatory Visit (HOSPITAL_COMMUNITY): Payer: Self-pay | Admitting: Psychiatry

## 2014-05-04 ENCOUNTER — Ambulatory Visit (INDEPENDENT_AMBULATORY_CARE_PROVIDER_SITE_OTHER): Payer: Medicaid Other | Admitting: Neurology

## 2014-05-04 ENCOUNTER — Other Ambulatory Visit (HOSPITAL_COMMUNITY): Payer: Self-pay | Admitting: Psychiatry

## 2014-05-04 ENCOUNTER — Ambulatory Visit: Payer: Self-pay

## 2014-05-04 DIAGNOSIS — F489 Nonpsychotic mental disorder, unspecified: Secondary | ICD-10-CM

## 2014-05-04 NOTE — Telephone Encounter (Signed)
Given on 04/14/14 with one refill. Too soon to refill

## 2014-05-06 NOTE — Progress Notes (Signed)
Patient here for lab draw for HLA typing for narcolepsy.  Patient to treatment room.  Under aseptic technique 23g butterfly inserted in right AC, good blood return.  Tolerated well, guaze applied.  Patient to check out in NAD. 

## 2014-05-13 ENCOUNTER — Telehealth (HOSPITAL_COMMUNITY): Payer: Self-pay | Admitting: *Deleted

## 2014-05-13 ENCOUNTER — Telehealth: Payer: Self-pay | Admitting: *Deleted

## 2014-05-13 ENCOUNTER — Telehealth (HOSPITAL_COMMUNITY): Payer: Self-pay

## 2014-05-13 NOTE — Telephone Encounter (Signed)
Provigil not authorized by insurance.Dr. 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

## 2014-05-13 NOTE — Telephone Encounter (Signed)
Message copied by Tonny BollmanKNISLEY, Trenten Watchman M on Wed May 13, 2014  2:04 PM ------      Message from: Kathryne SharperARFEEN, SYED T      Created: Wed May 13, 2014  1:59 PM      Regarding: RE: Provigil auth[place] [place] [facility]       Please call patient. She should contact nuerology for this medication.      ----- Message -----         From: Tonny BollmanSandra M Wyvonne Carda, RN         Sent: 05/13/2014  12:33 PM           To: Cleotis NipperSyed T Arfeen, MD      Subject: Provigil authroization denied by [place] Medicaid             FYI:      [place] Medicaid denied request for Provigil.      Stated it did not meed FDA requirements for this medication.      In order to approve-Must have diagnosis of narcolepsy, Excessive sleepiness r/t shift work, Excessive sleepiness r/t Myostonic Dystrophy, Excessive sleepiness r/t diagnosis of Sleep Apnea or use of CPAP.       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      Please advise on next step                    ------

## 2014-05-13 NOTE — Telephone Encounter (Signed)
Dr. Vickey Hugerohmeier addressed her labs for Narcolepsy which were positive and the advised to patient to come into the office to discuss the results and possible treatment options.  The patient was notified and an appointment was scheduled with Dr. Vickey Hugerohmeier for May 18, 2014.

## 2014-05-18 ENCOUNTER — Ambulatory Visit (INDEPENDENT_AMBULATORY_CARE_PROVIDER_SITE_OTHER): Payer: Medicaid Other | Admitting: Neurology

## 2014-05-18 ENCOUNTER — Other Ambulatory Visit (HOSPITAL_COMMUNITY): Payer: Self-pay | Admitting: *Deleted

## 2014-05-18 ENCOUNTER — Encounter: Payer: Self-pay | Admitting: Neurology

## 2014-05-18 VITALS — Ht 61.0 in | Wt 201.0 lb

## 2014-05-18 DIAGNOSIS — R0989 Other specified symptoms and signs involving the circulatory and respiratory systems: Secondary | ICD-10-CM

## 2014-05-18 DIAGNOSIS — R0609 Other forms of dyspnea: Secondary | ICD-10-CM

## 2014-05-18 DIAGNOSIS — G47419 Narcolepsy without cataplexy: Secondary | ICD-10-CM

## 2014-05-18 DIAGNOSIS — R519 Headache, unspecified: Secondary | ICD-10-CM

## 2014-05-18 DIAGNOSIS — E669 Obesity, unspecified: Secondary | ICD-10-CM

## 2014-05-18 DIAGNOSIS — F429 Obsessive-compulsive disorder, unspecified: Secondary | ICD-10-CM

## 2014-05-18 DIAGNOSIS — R0683 Snoring: Secondary | ICD-10-CM

## 2014-05-18 DIAGNOSIS — R51 Headache: Secondary | ICD-10-CM

## 2014-05-18 HISTORY — DX: Narcolepsy without cataplexy: G47.419

## 2014-05-18 MED ORDER — FLUOXETINE HCL 40 MG PO CAPS
40.0000 mg | ORAL_CAPSULE | Freq: Every day | ORAL | Status: DC
Start: 2014-05-18 — End: 2014-06-15

## 2014-05-18 MED ORDER — MODAFINIL 200 MG PO TABS: 200.0000 mg | ORAL_TABLET | Freq: Every day | ORAL | Status: DC

## 2014-05-18 NOTE — Patient Instructions (Signed)
Narcolepsy Narcolepsy is a nervous system disorder that causes daytime sleepiness and sudden bouts of irresistible sleep during the day (sleep attacks). Many people with narcolepsy live with extreme daytime sleepiness for many years before being diagnosed and treated. Narcolepsy is a lifelong (chronic) disorder.  You normally go through cycles when you sleep. When your sleep becomes deeper, you have less body movement, and you start dreaming. This type of deep sleep should happen after about 90 minutes of lighter sleep. Deep sleep is called rapid eye movement (REM) sleep. When you have narcolepsy, REM is not well regulated. It can happen as soon as you fall asleep, and components of REM sleep can occur during the day when you are awake. Uncontrolled REM sleep causes symptoms of narcolepsy. CAUSES Narcolepsy may be caused by an abnormality with a chemical messenger (neurotransmitter) in your brain that controls your sleep and wake cycles. Most people with narcolepsy have low levels of the neurotransmitter hypocretin. Hypocretin is important for controlling wakefulness.  A hypocretin imbalance may be caused by abnormal genes that are passed down through families. It may also develop if the body's defense system (immune system) mistakenly attacks the brain cells that produce hypocretin (autoimmune disease). RISK FACTORS You may be at higher risk for narcolepsy if you have a family history of the disease. Other risk factors that may contribute include:  Injuries, tumors, or infections in the areas of the brain that control sleep.  Exposure to toxins.  Stress.  Hormones produced during puberty and menopause.  Poor sleep habits. SIGNS AND SYMPTOMS  Symptoms of narcolepsy can start at any age but usually begin when people are between the ages of 7 and 25 years. There are four major symptoms. Not everyone with narcolepsy will have all four.   Excessive daytime sleepiness. This is the most common symptom  and is usually the first symptom you will notice.  You may feel as if you are in a mental fog.  Daytime sleepiness may severely affect your performance at work or school.  You may fall asleep during a conversation or while eating dinner.  Sudden loss of muscle tone (cataplexy). You do not lose consciousness, but you may suddenly lose muscle control. When this occurs, your speech may become slurred, or your knees may buckle. This symptom is usually triggered by surprise, anger, or laughter.  Sleep paralysis. You may lose the ability to speak or move just as you start to fall asleep or wake up. You will be aware of the paralysis. It usually lasts for just a few seconds or minutes.  Vivid hallucinations. These may occur with sleep paralysis. The hallucinations are like having bizarre or frightening dreams while you are still awake. Other symptoms may include:   Trouble staying asleep at night (insomnia).  Restless sleep.  Feeling a strong urge to get up at night to smoke or eat. DIAGNOSIS  Your health care provider can diagnose narcolepsy based on your symptoms and the results of two diagnostic tests. You may also be asked to keep a sleep diary for several weeks. You may need the tests if you have had daytime sleepiness for at least 3 months. The two tests are:   A polysomnogram to find out how well your REM sleep is regulated at night. This test is an overnight sleep study. It measures your heart rate, breathing, movement, and brain waves.  A multiple sleep latency test (MSLT) to find out how well your REM sleep is regulated during the day. This   is a daytime sleep study. You may need to take several naps during the day. This test also measures your heart rate, breathing, movement, and brain waves. TREATMENT  There is no cure for narcolepsy, but treatment can be very effective in helping manage the condition. Treatment may include:  Lifestyle and sleeping strategies to help cope with the  condition.  Medicines. These may include:  Stimulant medicines to fight daytime sleepiness.  Antidepressant medicines to treat cataplexy.  Sodium oxybate. This is a strong sedative that you take at night. It can help daytime sleepiness and cataplexy. HOME CARE INSTRUCTIONS  Take all medicines as directed by your health care provider.  Follow these sleep practices:  Try to get about 8 hours of sleep every night.  Go to sleep and get up close to the same time every day.  Keep your bedroom dark, quiet, and comfortable.  Schedule short naps for when you feel sleepiest during the day. Tell your employer or teachers that you have narcolepsy. You may be able to adjust your schedule to include time for naps.  Try to get at least 20 minutes of exercise every day. This will help you sleep better at night and reduce daytime sleepiness.  Do not drink alcohol or caffeinated beverages within 4-5 hours of bedtime.  Do not eat a heavy meal before bedtime. Eat at about the same times every day.  Do not smoke.  Do not drive if you are sleepy or have untreated narcolepsy.  Do not swim or go out on the water without a life jacket. SEEK MEDICAL CARE IF: Your medicines are not controlling your narcolepsy symptoms. SEEK IMMEDIATE MEDICAL CARE IF:  You hurt yourself during a sleep attack or an attack of cataplexy.  You have chest pain or trouble breathing. Document Released: 09/01/2002 Document Revised: 01/26/2014 Document Reviewed: 09/03/2013 ExitCare Patient Information 2015 [employer]. This information is not intended to replace advice given to you by your health care provider. Make sure you discuss any questions you have with your health care provider.  

## 2014-05-18 NOTE — Progress Notes (Signed)
[facility]  Provider:  Larey Seat, M D  Referring Provider: Saralyn Pilar, MD Primary Care Physician:  Saralyn Pilar  Chief Complaint  Patient presents with  . Nacrolepsy results    F/u, Rm 11    HPI:  [name] is a 28 y.o. female  Is seen here as a referral/ revisit  from Dr. Prince Rome for  Spells,     The patient had a history of one clinical seizure at age 35, he had exercise-induced asthma as a teenager and she had a back surgery at age 43 portable spine. In 2004 -February she had a second seizure,  witnessed , and as far as she recalls unprovoked.  Became rigid her eyes rolled back and she lost awareness of her surroundings. She also has reported myoclonic jerks she also has insomnia and excessive daytime sleepiness. Her EEG was normal. Then on 04/12/2013 the patient had a second seizure in adulthood, had not been on seizure medication since her pregnancy in 2012. She reports that she was home alone with her 47-year-old child and remembers looking down performed feeling disconnected then lost control of her surroundings she of books sitting on the sofa feeling very tired with pain of the head and neck.  She received 1 mg of Ativan intravenously and the local emergency room 1 no imaging was done.  The patient has reportedly still problems to get enough sleep and on questioning had many symptoms that would be obsessive-compulsive in origin please look at our last visit note for details.  I had initiated a medical as well as recommended strongly a psychology or psychiatric visit to evaluate for depression with OCD.  She was tested on request of Dr. 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   The patient got married to the father of her 2 children  in June 2015.   Review of Systems: Out of a complete 14 system review, the patient complains of only the following symptoms, and all other reviewed systems are negative. epworth 19 , FSS 81,   Depression score not obtained today.  Insomnia worse, severe  nightmares.  History   Social History  . Marital Status: Married    Spouse Name: N/A    Number of Children: 2  . Years of Education: 14   Occupational History  . 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 Smoking status: Never Smoker   . Smokeless tobacco: Never Used  . Alcohol Use: Yes     Comment: occas.  . Drug Use: No  . Sexual Activity: Yes    Partners: Male   Other Topics Concern  . Not on file   Social History Narrative   HSG, finished 2 years of college. single mom with 2 sons blake - Aug '09, Elijah Dec '12. 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 Still lives with father of her sons with wedding plans on hold ( Jan '13)    Family History  Problem Relation Age of Onset  . Anesthesia problems Neg Hx   . Heart disease Mother   . ADD / ADHD Sister     Past Medical History  Diagnosis Date  . MIGRAINES, HX OF 06/24/2007  . Gallstones   . IBS (irritable bowel syndrome)     doesn't take any meds  . OCD (obsessive compulsive disorder)     takes Prozac daily  .  Anxiety     takes Klonopin nightly  . ASTHMA NOS W/ACUTE EXACERBATION 01/05/2010    exercise induced  . History of migraine     last one a couple of days ago  . Coarse tremors   . Seizures     takes Lamictal daily;last seizure was in 11/14/10  . Degenerative disc disease     back  . Back pain   . GERD (gastroesophageal reflux disease)     but doesn't take anything for it  . Urinary frequency   . Medullary sponge kidney 2007    kidneys can be sick but she isnt sick  . PYELONEPHRITIS 06/24/2007  . History of UTI     frequent  . History of kidney stones   . Anemia     takes Ferrous Sulfate daily  . Narcolepsy   . 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 Back surgery    . Lumbar laminectomy/decompression microdiscectomy  07/24/2012     Procedure: LUMBAR LAMINECTOMY/DECOMPRESSION MICRODISCECTOMY;  Surgeon: Tobi Bastos, MD;  Location: [facility];  Service: Orthopedics;  Laterality: Right;  L5-S1  . Colonoscopy    . Cholecystectomy N/A 12/02/2013    Procedure: LAPAROSCOPIC CHOLECYSTECTOMY WITH ATTEMPTED INTRAOPERATIVE CHOLANGIOGRAM;  Surgeon: Gayland Curry, MD;  Location: [facility];  Service: General;  Laterality: N/A;    Current Outpatient Prescriptions  Medication Sig Dispense Refill  . clonazePAM (KLONOPIN) 1 MG tablet TAKE 1 TABLET BY MOUTH AT BEDTIME  30 tablet  1  . FLUoxetine (PROZAC) 40 MG capsule Take 1 capsule (40 mg total) by mouth at bedtime.  30 capsule  1  . gabapentin (NEURONTIN) 300 MG capsule Take 300 mg by mouth daily.      . LamoTRIgine (LAMICTAL XR) 300 MG TB24 Take 300 mg by mouth daily.      . Norethindrone-Ethinyl Estradiol-Fe (GENERESS FE) 0.8-25 MG-MCG tablet Chew 1 tablet by mouth at bedtime.       . ondansetron (ZOFRAN) 4 MG tablet Take one tablet every 6 hours PRN nausea, #15, no refills, generic substitution allowed       No current facility-administered medications for this visit.    Allergies as of 05/18/2014 - Review Complete 05/18/2014  Allergen Reaction Noted  . Other Anaphylaxis 09/01/2011  . Ciprofloxacin Other (See Comments)   . Nsaids Other (See Comments) 11/14/2010  . Topiramate Other (See Comments)   . Triptans  07/23/2012  . Adhesive [tape] Rash 11/19/2013    Vitals: Ht '5\' 1"'  (1.549 m)  Wt 201 lb (91.173 kg)  BMI 38.00 kg/m2  LMP 04/27/2014 Last Weight:  Wt Readings from Last 1 Encounters:  05/18/14 201 lb (91.173 kg)   Last Height:   Ht Readings from Last 1 Encounters:  05/18/14 '5\' 1"'  (1.549 m)   Body mass index is now  38 from 34.55 kg/(m^2). Up from last visit.   Neck circumference. 15 inches, mallompati 3 , further weight gain.  Generalized: In no acute distress, very anxious and tense appearing , pleasant obese Caucasian female  Neck: Supple, no carotid bruits   Cardiac: Regular rate rhythm, no murmur.  Pulmonary: Clear to auscultation bilaterally.  Musculoskeletal: Tenderness of the paraspinal cervical area and over both levator scapulae .  Neurological examination  Mentation: Alert oriented to time, place, history taking, language fluent, and causual conversation .  The patient is very emotional, easily upset-  She is falling asleep at any place and time.    Cranial  nerve II-XII:  Pupils were equal round reactive to light extraocular movements were full, visual field were full on confrontational test. facial sensation and strength were normal. hearing was intact to finger rubbing bilaterally. Uvula tongue midline.  head turning and shoulder shrug and were normal and symmetric.Tongue protrusion into cheek strength was normal.  MOTOR:  normal bulk and tone, full strength in the BUE, BLE, fine finger movements normal, no pronator drift  SENSORY:  normal and symmetric to light touch\ vibration and proprioception  COORDINATION:  finger-nose-finger normal on the right, and rotation/supination weakness on the left arm, heel-to-shin normal bilaterally, there was no truncal ataxia.  REFLEXES:  2/2, plantar responses were flexor bilaterally.  GAIT/STATION: Rising up from seated position without assistance, normal stance, without trunk ataxia,  moderate stride, good arm swing, smooth turning, able to perform tiptoe, and heel walking without difficulty.   DIAGNOSTIC DATA (LABS, IMAGING, TESTING)  - I reviewed patient records, labs, notes, HLA test . 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   I like for [name] to continue taking the Lamictal at 100 mg X. are as it seems to have had a positive effect on her mood and compulsion.  [name] has trouble initiating weight loss, and especially  her nephrologist has urged her to work on this problem.  At a body mass index of 35 she is considered obese. She wakes with headaches,  I ordered a sleep study , SPLIT at 15 and score at 4% . CO2 measures needed.   My plan for [name] is to see her in 3 month by NP and after that once a year in this office for medication management.  This visit duration was 45 minutes including education for narcolepsy and problem oriented physical exam.

## 2014-05-22 ENCOUNTER — Telehealth: Payer: Self-pay | Admitting: Neurology

## 2014-05-22 NOTE — Telephone Encounter (Signed)
I contacted ins, spoke with Misty Stanley.  Provided clinical info.  The request is currently under review.  I called the patient back.  She is aware.

## 2014-05-22 NOTE — Telephone Encounter (Signed)
Patient checking status of authorization for modafinil (PROVIGIL) 200 MG tablet.  Please call anytime and leave detailed message on voice mail.

## 2014-05-27 ENCOUNTER — Encounter: Payer: Self-pay | Admitting: Neurology

## 2014-06-08 ENCOUNTER — Encounter: Payer: Self-pay | Admitting: Dietician

## 2014-06-08 ENCOUNTER — Ambulatory Visit: Payer: Self-pay | Admitting: Dietician

## 2014-06-08 ENCOUNTER — Encounter: Payer: Medicaid Other | Attending: Family Medicine | Admitting: Dietician

## 2014-06-08 VITALS — Ht 61.0 in | Wt 195.2 lb

## 2014-06-08 DIAGNOSIS — Z713 Dietary counseling and surveillance: Secondary | ICD-10-CM | POA: Diagnosis not present

## 2014-06-08 DIAGNOSIS — E669 Obesity, unspecified: Secondary | ICD-10-CM | POA: Insufficient documentation

## 2014-06-08 DIAGNOSIS — Z6836 Body mass index (BMI) 36.0-36.9, adult: Secondary | ICD-10-CM | POA: Diagnosis not present

## 2014-06-08 NOTE — Patient Instructions (Addendum)
Plan to eat at least 3 x day. If you are hungry, add a snack (1-3). Have healthy snacks available. Have protein with carbs for snacks.  For protein, have boiled eggs, peanut butter, beans, chicken salad, nuts available to have for snacks/meals. Try a [facility] [employer] or a protein shake (less 10 g carbs) for a meal replacement.  Consider cutting back on sweet tea. Drink mostly water.  Aim to get at least 30 minutes of activity each day.

## 2014-06-08 NOTE — Progress Notes (Signed)
  Medical Nutrition Therapy:  Appt start time: 1040 end time:  1120.  Assessment:  Primary concerns today: Bunny is here today she she's had unexplained weight gain. Has gained weight (about 50 lbs) after her back surgery in 2010. Gained 20 lbs last month in a week's time period though has lost it. Has recently weighed over 200 lbs, but lost some weight since then. Was recently diagnosed with narcolepsy. Still feels tired and has no energy and has a follow up sleep study scheduled.  Can get down to 180 lbs and can't get below. Is never hungry, takes care of kids, and forgets to take care of herself. Started to feel hungry again last week. Tends to skip a lot of meals. Has been eating the same pattern for awhile, but eats more vegetables and less sugar than she used to before her surgery. Uses small plates.   Is a full time student studying early childhood education. Lives with her husband and 2 kids. She does the food shopping and meal preparation at home. Eats out maybe 1 x week.   Does not eat a lot of sodium since it gives her migraines. Also tries to limit sugar to less 2g.  Preferred Learning Style:   No preference indicated   Learning Readiness:   Contemplating  MEDICATIONS: see list   DIETARY INTAKE:  Usual eating pattern includes 1-2 meals and 0-1 snacks per day.  Avoided foods include: balsamic vinegar, soy milk, frozen food, canned food, yogurt, avoids a lot of meat, hummus     24-hr recall:  B ( AM): honey nut cheerios with 2% milk with orange juice or bacon and egg sandwich  Snk ( AM): none  L ( PM): skips or will have 1/2 peanut butter and jelly  Snk ( PM): none D ( PM): chicken, green beans, potatoes, or meatloaf or beef stroganoff or pork chops Snk ( PM): once in a while will have popcorn Beverages: water, 1 glass tea, small glass orange juice  Usual physical activity: daily yoga for 30 minutes, plays with kids and walks in neighborhood with them  Estimated energy  needs: 2000 calories 225 g carbohydrates 150 g protein 56 g fat  Progress Towards Goal(s):  In progress.   Nutritional Diagnosis:  [place]-3.3 Overweight/obesity As related to hx of meal skipping and excess carbohydrates.  As evidenced by BMI of 36.9.    Intervention:  Nutrition counseling provided. Plan: Plan to eat at least 3 x day. If you are hungry, add a snack (1-3). Have healthy snacks available. Have protein with carbs for snacks.  For protein, have boiled eggs, peanut butter, beans, chicken salad, nuts available to have for snacks/meals. Try a [facility] [employer] or a protein shake (less 10 g carbs) for a meal replacement.  Consider cutting back on sweet tea. Drink mostly water.  Aim to get at least 30 minutes of activity each day.  Teaching Method Utilized:  Visual Auditory Hands on  Handouts given during visit include:  MyPlate Handout  15 g CHO  Barriers to learning/adherence to lifestyle change: busy schedule, doesn't like a lot of foods, doesn't feel hungry  Demonstrated degree of understanding via:  Teach Back   Monitoring/Evaluation:  Dietary intake, exercise, and body weight prn.

## 2014-06-09 ENCOUNTER — Ambulatory Visit: Payer: Medicaid Other | Admitting: Nurse Practitioner

## 2014-06-15 ENCOUNTER — Telehealth: Payer: Self-pay | Admitting: *Deleted

## 2014-06-15 ENCOUNTER — Ambulatory Visit (HOSPITAL_COMMUNITY): Payer: Self-pay | Admitting: Psychiatry

## 2014-06-15 ENCOUNTER — Other Ambulatory Visit (HOSPITAL_COMMUNITY): Payer: Self-pay | Admitting: Psychiatry

## 2014-06-15 NOTE — Telephone Encounter (Signed)
Not working, constantly tired and sleepy.  Modafinil  po daily (am).  Just diagnosed and first attempt at medications. Can we try something else?  [phone]  Also trying to get appt for sleep study.  Will forward to sleep lab.

## 2014-06-16 MED ORDER — MODAFINIL 200 MG PO TABS: ORAL_TABLET | ORAL | Status: DC

## 2014-06-16 NOTE — Telephone Encounter (Signed)
Sleep study ? This patient cannot undergo MSLT on antidepressants and other psychotropic medications- that's why we did HLA test.  However , we can rule out apnea in a PSG or other contributory sleep conditions.

## 2014-06-17 NOTE — Telephone Encounter (Signed)
Pt's Rx was faxed to [phone], confirmation received.

## 2014-06-19 ENCOUNTER — Telehealth: Payer: Self-pay | Admitting: Neurology

## 2014-06-19 NOTE — Telephone Encounter (Signed)
Patient has been called and the sleep study has been scheduled and the prescription was at the pharmacy, but they did not pay attention to the medication increase.  Patient verbalized understanding.

## 2014-06-19 NOTE — Telephone Encounter (Signed)
Patient is following up on a call she had to made to our office this past Tuesday and patient has not received a returned call. Please call.

## 2014-06-24 ENCOUNTER — Telehealth: Payer: Self-pay | Admitting: Neurology

## 2014-06-24 NOTE — Telephone Encounter (Signed)
There is only adderall or ritalin to try , the XYREM medication is not safe in depression patients. RV with Np to discuss treatment options with stimulants. Epworth. .Marland Kitchen

## 2014-06-24 NOTE — Telephone Encounter (Signed)
I called back.  The patient says even though she has been taking Provigil 200mg   one and one half tabs daily, still feels very tired.  She would like to know if something else can be recommended.  Please advise.  Thank you.

## 2014-06-24 NOTE — Telephone Encounter (Signed)
Patient pick up Rx of modafinil (PROVIGIL) 200 MG tablet from Drug store.  She received Generic brand and questioning if it's ok to take?  Please call any time and may leave detailed message on voicemail.

## 2014-06-26 NOTE — Telephone Encounter (Signed)
Scheduled patient to see Megan on 06/29/14.

## 2014-06-26 NOTE — Telephone Encounter (Signed)
Called patient to schedule appointment, left message to call the office back to schedule with NP MM.

## 2014-06-29 ENCOUNTER — Ambulatory Visit (INDEPENDENT_AMBULATORY_CARE_PROVIDER_SITE_OTHER): Payer: Medicaid Other | Admitting: Adult Health

## 2014-06-29 ENCOUNTER — Encounter: Payer: Self-pay | Admitting: Adult Health

## 2014-06-29 VITALS — BP 121/81 | HR 79 | Ht 61.5 in | Wt 198.0 lb

## 2014-06-29 DIAGNOSIS — R569 Unspecified convulsions: Secondary | ICD-10-CM

## 2014-06-29 DIAGNOSIS — G47419 Narcolepsy without cataplexy: Secondary | ICD-10-CM

## 2014-06-29 DIAGNOSIS — Z5181 Encounter for therapeutic drug level monitoring: Secondary | ICD-10-CM

## 2014-06-29 MED ORDER — AMPHETAMINE-DEXTROAMPHETAMINE 10 MG PO TABS: 10.0000 mg | ORAL_TABLET | Freq: Every day | ORAL | Status: DC

## 2014-06-29 NOTE — Progress Notes (Signed)
I agree with the assessment and plan as directed by NP .The patient is known to me .   Arminta Gamm, MD  

## 2014-06-29 NOTE — Progress Notes (Signed)
PATIENT: [name] DOB: Jun 02, 1986  REASON FOR VISIT: follow up HISTORY FROM: patient  HISTORY OF PRESENT ILLNESS:  [name] is a 28 year old female with a history of seizures and narcolepsy. She returns today for follow-up. She is currently taking Lamictal 300 mg daily for seizures and Provigil 200 mg for daytime sleepiness. She reports that she continues to feel fatigued and sleepy throughout the day. Her Epworth is 20 and Fatigue Severity score is 60. She feels that the Provigil offers some relief but would like to try something else.She sleeps 6-7 hours a night. Goes to bed between 11:00 pm- 12:00 am and arises at 6:30 am. She does have trouble falling asleep and therefore some nights she will only get 3-4 hours of sleep. She does use Klonopin to help her fall asleep.  She reports that Lamictal has controlled her seizures. Her last seizure was in April.   HISTORY 05/18/14 (CD): [name] is a 28 y.o. female Is seen here as a referral/ revisit from Dr. Lorenso Courier for [facility],  The patient had a history of one clinical seizure at age 72, he had exercise-induced asthma as a teenager and she had a back surgery at age 63 portable spine. In 2004 -February she had a second seizure, witnessed , and as far as she recalls unprovoked.  Became rigid her eyes rolled back and she lost awareness of her surroundings. She also has reported myoclonic jerks she also has insomnia and excessive daytime sleepiness. Her EEG was normal. Then on 04/12/2013 the patient had a second seizure in adulthood, had not been on seizure medication since her pregnancy in 2012. She reports that she was home alone with her 13-year-old child and remembers looking down performed feeling disconnected then lost control of her surroundings she of books sitting on the sofa feeling very tired with pain of the head and neck.  She received 1 mg of Ativan intravenously and the local emergency room 1 no imaging was done.  The patient has reportedly  still problems to get enough sleep and on questioning had many symptoms that would be obsessive-compulsive in origin please look at our last visit note for details.  I had initiated a medical as well as recommended strongly a psychology or psychiatric visit to evaluate for depression with OCD.    REVIEW OF SYSTEMS: Full 14 system review of systems performed and notable only for:  Constitutional: N/A  Eyes: N/A Ear/Nose/Throat: N/A  Skin: N/A  Cardiovascular: N/A  Respiratory: cough Gastrointestinal: N/A  Genitourinary: N/A Hematology/Lymphatic: N/A  Endocrine: N/A Musculoskeletal:back pain Allergy/Immunology:food allergies Neurological: N/A Psychiatric: depression, nervous/anxious,hallucinations Sleep: insomnia, daytime sleepiness, snoring   ALLERGIES: Allergies  Allergen Reactions  . Other Anaphylaxis    balsalmic vingerette  . Ciprofloxacin Other (See Comments)    Patient stated that her arm started burning really intensely.  . Nsaids Other (See Comments)    Told to avoid NSAIDS due to her kidney disorder.  . Topiramate Other (See Comments)    Reaction unknown  . Triptans     Happened maybe in '05 possibly, reaction unsure.   . Adhesive [Tape] Rash    HOME MEDICATIONS: Outpatient Prescriptions Prior to Visit  Medication Sig Dispense Refill  . clonazePAM (KLONOPIN) 1 MG tablet TAKE 1 TABLET BY MOUTH AT BEDTIME  30 tablet  1  . FLUoxetine (PROZAC) 40 MG capsule TAKE 1 CAPSULE (40 MG TOTAL) BY MOUTH AT BEDTIME.  30 capsule  0  . gabapentin (NEURONTIN) 300 MG  capsule Take 300 mg by mouth daily.      . LamoTRIgine (LAMICTAL XR) 300 MG TB24 Take 300 mg by mouth daily.      . modafinil (PROVIGIL) 200 MG tablet Take one in AM and one half tab at lunch time.  45 tablet  5  . Norethindrone-Ethinyl Estradiol-Fe (GENERESS FE) 0.8-25 MG-MCG tablet Chew 1 tablet by mouth at bedtime.       . ondansetron (ZOFRAN) 4 MG tablet Take one tablet every 6 hours PRN nausea, #15, no refills,  generic substitution allowed       No facility-administered medications prior to visit.    PAST MEDICAL HISTORY: Past Medical History  Diagnosis Date  . MIGRAINES, HX OF 06/24/2007  . Gallstones   . IBS (irritable bowel syndrome)     doesn't take any meds  . OCD (obsessive compulsive disorder)     takes Prozac daily  . Anxiety     takes Klonopin nightly  . ASTHMA NOS W/ACUTE EXACERBATION 01/05/2010    exercise induced  . History of migraine     last one a couple of days ago  . Coarse tremors   . Seizures     takes Lamictal daily;last seizure was in 11/14/10  . Degenerative disc disease     back  . Back pain   . GERD (gastroesophageal reflux disease)     but doesn't take anything for it  . Urinary frequency   . Medullary sponge kidney 2007    kidneys can be sick but she isnt sick  . PYELONEPHRITIS 06/24/2007  . History of UTI     frequent  . History of kidney stones   . Anemia     takes Ferrous Sulfate daily  . Narcolepsy   . 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 Back surgery    . Lumbar laminectomy/decompression microdiscectomy  07/24/2012    Procedure: LUMBAR LAMINECTOMY/DECOMPRESSION MICRODISCECTOMY;  Surgeon: Jacki Cones, MD;  Location: [facility];  Service: Orthopedics;  Laterality: Right;  L5-S1  . Colonoscopy    . Cholecystectomy N/A 12/02/2013    Procedure: LAPAROSCOPIC CHOLECYSTECTOMY WITH ATTEMPTED INTRAOPERATIVE CHOLANGIOGRAM;  Surgeon: Atilano Ina, MD;  Location: [facility];  Service: General;  Laterality: N/A;    FAMILY HISTORY: Family History  Problem Relation Age of Onset  . Anesthesia problems Neg Hx   . Heart disease Mother   . ADD / ADHD Sister     SOCIAL HISTORY: History   Social History  . Marital Status: Married    Spouse Name: N/A    Number of Children: 2  . Years of Education: 14   Occupational History  . 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 Smoking status: Never Smoker   . Smokeless tobacco: Never Used  . Alcohol Use: Yes     Comment: occas.  . Drug Use: No  . Sexual Activity: Yes    Partners: Male   Other Topics Concern  . Not on file   Social History Narrative   HSG, finished 2 years of college. single mom with 2 sons blake - Aug '09, Elijah Dec '12. 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 Still lives with father of her sons with wedding plans on hold ( Jan '13)      PHYSICAL EXAM  Filed Vitals:   06/29/14 [phone]  BP: 121/81  Pulse: 79  Height: 5' 1.5" (1.562 m)  Weight: 198 lb (89.812 kg)   Body mass index is 36.81 kg/(m^2).  Generalized: Well developed, in no acute distress   Neurological examination  Mentation: Alert oriented to time, place, history taking. Follows all commands speech and language fluent Cranial nerve II-XII: Pupils were equal round reactive to light. Extraocular movements were full, visual field were full on confrontational test. Facial sensation and strength were normal.  Uvula tongue midline. Tonsils 4 +. Head turning and shoulder shrug  were normal and symmetric. Motor: The motor testing reveals 5 over 5 strength of all 4 extremities. Good symmetric motor tone is noted throughout.  Sensory: Sensory testing is intact to soft touch on all 4 extremities. No evidence of extinction is noted.  Coordination: Cerebellar testing reveals good finger-nose-finger and heel-to-shin bilaterally.  Gait and station: Gait is normal. Tandem gait is normal. Romberg is negative. No drift is seen.  Reflexes: Deep tendon reflexes are symmetric and normal bilaterally.    DIAGNOSTIC DATA (LABS, IMAGING, TESTING) - I reviewed patient records, labs, notes, testing and imaging myself where available.  Lab Results  Component Value Date   WBC 8.9 04/04/2014   HGB 12.3 04/04/2014   HCT 36.7 04/04/2014   MCV 87.0 04/04/2014   PLT 278 04/04/2014      Component Value  Date/Time   NA 140 04/04/2014 1853   NA 141 10/30/2013 1615   K 3.7 04/04/2014 1853   CL 103 04/04/2014 1853   CO2 24 04/04/2014 1853   GLUCOSE 95 04/04/2014 1853   GLUCOSE 85 10/30/2013 1615   BUN 13 04/04/2014 1853   BUN 10 10/30/2013 1615   CREATININE 0.84 04/04/2014 1853   CREATININE 0.77 12/23/2013 0837   CALCIUM 9.3 04/04/2014 1853   PROT 6.9 12/23/2013 0837   PROT 7.2 10/30/2013 1615   ALBUMIN 3.8 12/23/2013 0837   AST 13 12/23/2013 0837   ALT 12 12/23/2013 0837   ALKPHOS 78 12/23/2013 0837   BILITOT 0.3 12/23/2013 0837   GFRNONAA >90 04/04/2014 1853   GFRAA >90 04/04/2014 1853    Lab Results  Component Value Date   VITAMINB12 275 10/30/2013   Lab Results  Component Value Date   TSH 1.53 12/07/2006      ASSESSMENT AND PLAN 28 y.o. year old female  has a past medical history of MIGRAINES, HX OF (06/24/2007); Gallstones; IBS (irritable bowel syndrome); OCD (obsessive compulsive disorder); Anxiety; ASTHMA NOS W/ACUTE EXACERBATION (01/05/2010); History of migraine; Coarse tremors; Seizures; Degenerative disc disease; Back pain; GERD (gastroesophageal reflux disease); Urinary frequency; Medullary sponge kidney (2007); PYELONEPHRITIS (06/24/2007); History of UTI; History of kidney stones; Anemia; Narcolepsy; and Narcolepsy (05/18/2014). here with:  1. Narcolepsy 2. Seizures  The patient feels like Provigil offers minimal benefit. She would like to try something different. I will stop the Provigil and start Adderall 10 mg daily. She will let us know how this dosage is working. We may have to adjust the dose for maximal benefit. The patient continues to take Lamictal for seizures. She has been seizure free since April. I will check a lamotrigine level today. The patient had a sleep study scheduled for later this month. She will followup with us in November following her sleep study.   Butch PennyMegan Elroy Schembri, MSN, NP-C 06/29/2014, 9:35 AM [facility] [address], [address] [place], [place]  [id number] [phone]  Note: This document was prepared with digital dictation and possible smart phrase technology. Any transcriptional  errors that result from this process are unintentional.

## 2014-06-29 NOTE — Patient Instructions (Signed)
Amphetamine; Dextroamphetamine tablets What is this medicine? AMPHETAMINE; DEXTROAMPHETAMINE(am FET a meen; dex troe am FET a meen) is used to treat attention-deficit hyperactivity disorder (ADHD). It may also be used for narcolepsy. Federal law prohibits giving this medicine to any person other than the person for whom it was prescribed. Do not share this medicine with anyone else. This medicine may be used for other purposes; ask your health care provider or pharmacist if you have questions. COMMON BRAND NAME(S): Adderall What should I tell my health care provider before I take this medicine? They need to know if you have any of these conditions: -anxiety or panic attacks -circulation problems in fingers and toes -glaucoma -hardening or blockages of the arteries or heart blood vessels -heart disease or a heart defect -high blood pressure -history of a drug or alcohol abuse problem -history of stroke -kidney disease -liver disease -mental illness -seizures -suicidal thoughts, plans, or attempt; a previous suicide attempt by you or a family member -thyroid disease -Tourette's syndrome -an unusual or allergic reaction to dextroamphetamine, other amphetamines, other medicines, foods, dyes, or preservatives -pregnant or trying to get pregnant -breast-feeding How should I use this medicine? Take this medicine by mouth with a glass of water. Follow the directions on the prescription label. Take your doses at regular intervals. Do not take your medicine more often than directed. Do not suddenly stop your medicine. You must gradually reduce the dose or you may feel withdrawal effects. Ask your doctor or health care professional for advice. Talk to your pediatrician regarding the use of this medicine in children. Special care may be needed. While this drug may be prescribed for children as young as 3 years for selected conditions, precautions do apply. Overdosage: If you think you have taken too  much of this medicine contact a poison control center or emergency room at once. NOTE: This medicine is only for you. Do not share this medicine with others. What if I miss a dose? If you miss a dose, take it as soon as you can. If it is almost time for your next dose, take only that dose. Do not take double or extra doses. What may interact with this medicine? Do not take this medicine with any of the following medications: -certain medicines for migraine headache like almotriptan, eletriptan, frovatriptan, naratriptan, rizatriptan, sumatriptan, zolmitriptan -MAOIS like Carbex, Eldepryl, Marplan, Nardil, and Parnate -meperidine -other stimulant medicines for attention disorders, weight loss, or to stay awake -pimozide -procarbazine This medicine may also interact with the following medications: -acetazolamide -ammonium chloride -antacids -ascorbic acid -atomoxetine -caffeine -certain medicines for blood pressure -certain medicines for depression, anxiety, or psychotic disturbances -certain medicines for seizures like carbamazepine, phenobarbital, phenytoin -certain medicines for stomach problems like cimetidine, famotidine, omeprazole, lansoprazole -cold or allergy medicines -glutamic acid -methenamine -narcotic medicines for pain -norepinephrine -phenothiazines like chlorpromazine, mesoridazine, prochlorperazine, thioridazine -sodium acid phosphate -sodium bicarbonate This list may not describe all possible interactions. Give your health care provider a list of all the medicines, herbs, non-prescription drugs, or dietary supplements you use. Also tell them if you smoke, drink alcohol, or use illegal drugs. Some items may interact with your medicine. What should I watch for while using this medicine? Visit your doctor or health care professional for regular checks on your progress. This prescription requires that you follow special procedures with your doctor and pharmacy. You will  need to have a new written prescription from your doctor every time you need a refill. This medicine may affect your   concentration, or hide signs of tiredness. Until you know how this medicine affects you, do not drive, ride a bicycle, use machinery, or do anything that needs mental alertness. Tell your doctor or health care professional if this medicine loses its effects, or if you feel you need to take more than the prescribed amount. Do not change the dosage without talking to your doctor or health care professional. Decreased appetite is a common side effect when starting this medicine. Eating small, frequent meals or snacks can help. Talk to your doctor if you continue to have poor eating habits. Height and weight growth of a child taking this medicine will be monitored closely. Do not take this medicine close to bedtime. It may prevent you from sleeping. If you are going to need surgery, a MRI, CT scan, or other procedure, tell your doctor that you are taking this medicine. You may need to stop taking this medicine before the procedure. Tell your doctor or healthcare professional right away if you notice unexplained wounds on your fingers and toes while taking this medicine. You should also tell your healthcare provider if you experience numbness or pain, changes in the skin color, or sensitivity to temperature in your fingers or toes. What side effects may I notice from receiving this medicine? Side effects that you should report to your doctor or health care professional as soon as possible: -allergic reactions like skin rash, itching or hives, swelling of the face, lips, or tongue -changes in vision -chest pain or chest tightness -confusion, trouble speaking or understanding -fast, irregular heartbeat -fingers or toes feel numb, cool, painful -hallucination, loss of contact with reality -high blood pressure -males: prolonged or painful erection -seizures -severe headaches -shortness of  breath -suicidal thoughts or other mood changes -trouble walking, dizziness, loss of balance or coordination -uncontrollable head, mouth, neck, arm, or leg movements Side effects that usually do not require medical attention (report to your doctor or health care professional if they continue or are bothersome): -anxious -headache -loss of appetite -nausea, vomiting -trouble sleeping -weight loss This list may not describe all possible side effects. Call your doctor for medical advice about side effects. You may report side effects to FDA at 1-800-FDA-1088. Where should I keep my medicine? Keep out of the reach of children. This medicine can be abused. Keep your medicine in a safe place to protect it from theft. Do not share this medicine with anyone. Selling or giving away this medicine is dangerous and against the law. Store at room temperature between 15 and 30 degrees C (59 and 86 degrees F). Keep container tightly closed. Throw away any unused medicine after the expiration date. NOTE: This sheet is a summary. It may not cover all possible information. If you have questions about this medicine, talk to your doctor, pharmacist, or health care provider.  2015, [employer] Standard. (2013-07-25 18:16:55)  

## 2014-06-29 NOTE — Addendum Note (Signed)
Addended by: Enedina FinnerMILLIKAN, Arath Kaigler P on: 06/29/2014 11:03 AM   Modules accepted: Level of Service

## 2014-06-30 ENCOUNTER — Encounter (HOSPITAL_COMMUNITY): Payer: Self-pay | Admitting: Psychiatry

## 2014-06-30 ENCOUNTER — Ambulatory Visit (INDEPENDENT_AMBULATORY_CARE_PROVIDER_SITE_OTHER): Payer: Medicaid Other | Admitting: Psychiatry

## 2014-06-30 ENCOUNTER — Telehealth: Payer: Self-pay | Admitting: Adult Health

## 2014-06-30 VITALS — BP 116/71 | HR 90 | Ht 61.5 in | Wt 198.8 lb

## 2014-06-30 DIAGNOSIS — F429 Obsessive-compulsive disorder, unspecified: Secondary | ICD-10-CM

## 2014-06-30 DIAGNOSIS — F419 Anxiety disorder, unspecified: Secondary | ICD-10-CM

## 2014-06-30 DIAGNOSIS — F42 Obsessive-compulsive disorder: Secondary | ICD-10-CM

## 2014-06-30 DIAGNOSIS — F909 Attention-deficit hyperactivity disorder, unspecified type: Secondary | ICD-10-CM

## 2014-06-30 LAB — LAMOTRIGINE LEVEL: Lamotrigine Lvl: NOT DETECTED ug/mL (ref 2.0–20.0)

## 2014-06-30 MED ORDER — CLONAZEPAM 1 MG PO TABS: 1.0000 mg | ORAL_TABLET | Freq: Every day | ORAL | Status: DC

## 2014-06-30 MED ORDER — FLUOXETINE HCL 40 MG PO CAPS: 40.0000 mg | ORAL_CAPSULE | Freq: Every day | ORAL | Status: DC

## 2014-06-30 NOTE — Progress Notes (Signed)
[name] [phone] Progress Note  [name] [id number] 28 y.o.  06/30/2014 9:12 AM  Chief Complaint:  I still feel tired and sleeping too much.  My anxiety is better.      History of Present Illness:  [name] for her followup appointment.  She is taking Prozac 40 mg and Klonopin at bedtime.  She is taking Klonopin 2 mg but it is unclear increase her dose of Klonopin.  She is seeing a neurologist and now she is taking Adderall than her Provigil did not work very well.  She tried Provigil up to 300 mg but she remains very tired fatigue and hypersomnia.  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  She is getting treatment from Dr. Sibyl Parr.  She admitted some time she has some anxiety and nervousness but overall her OCD and panic attacks are under control.  She denies any rituals, checking numbers or any obsessive thoughts about cleaning are less intense and less frequent.  She endorsed at least 13 to 14 hours sleep in 24 hours.  She denies any paranoia, hallucination or any crying spells.  Her grades are improving.  She made good grades in her psychology class.  She is enrolled in [employer] for early childhood development and doing online courses.  Sometimes she has difficulty in focus and attention.  She is concerned about her weight gain .  She denied any side effects of medication.  Her migraine headaches are also less intense from the past. She still drinks alcohol on occasions but denies any binge drinking.  Her vitals are stable other than she has weight gain.  Patient lives with her husband and her son .  She is currently not working.    Suicidal Ideation: No Plan Formed: No Patient has means to carry out plan: No  Homicidal Ideation: No Plan Formed: No Patient has means to carry out plan: No  Past Psychiatric History/Hospitalization(s) Patient endorsed history of depression and anxiety symptoms after the first child born.   She has symptoms of ADD however she has no problem in school.  She always get better grades.  She was never tested for ADHD.  She also has history of rituals, obsessions and compulsions.  She endorsed history of anxiety nervousness and trust issue.  She denies any history of mania, psychosis, paranoia or any hallucination.  She denies any history of suicidal attempt, self abusive behavior and aggression.   Anxiety: Yes Bipolar Disorder: No Depression: Yes Mania: No Psychosis: No Schizophrenia: No Personality Disorder: No Hospitalization for psychiatric illness: No History of Electroconvulsive Shock Therapy: No Prior Suicide Attempts: No  Medical History; Patient has seizure disorder.  She started having grand mal seizures at early age.  She denies any history of head trauma.  Patient also has medullary sponge kidney however her kidney function test are all normal.  She is seeing Dr. [name] for narcolepsy.  She has history of migraine headaches.   Review of Systems  Constitutional: Positive for malaise/fatigue. Negative for weight loss.  Skin: Negative.   Neurological: Negative.   Psychiatric/Behavioral: Negative for suicidal ideas and hallucinations. The patient is nervous/anxious.        Hypersomnia    Psychiatric: Agitation: No Hallucination: No Depressed Mood: No Insomnia: No Hypersomnia: Yes Altered Concentration: No Feels Worthless: No Grandiose Ideas: No Belief In Special Powers: No New/Increased Substance Abuse: No Compulsions: Yes  Neurologic: Headache: Yes Seizure: Yes Paresthesias: No    Outpatient  Encounter Prescriptions as of 06/30/2014  Medication Sig  . amphetamine-dextroamphetamine (ADDERALL) 10 MG tablet Take 1 tablet (10 mg total) by mouth daily with breakfast.  . clonazePAM (KLONOPIN) 1 MG tablet Take 1 tablet (1 mg total) by mouth at bedtime. TAKE 1 TABLET BY MOUTH AT BEDTIME  . FLUoxetine (PROZAC) 40 MG capsule Take 1 capsule (40 mg total) by mouth  daily.  Marland Kitchen gabapentin (NEURONTIN) 300 MG capsule Take 300 mg by mouth daily.  . LamoTRIgine (LAMICTAL XR) 300 MG TB24 Take 300 mg by mouth daily.  . Norethindrone-Ethinyl Estradiol-Fe (GENERESS FE) 0.8-25 MG-MCG tablet Chew 1 tablet by mouth at bedtime.   . [DISCONTINUED] clonazePAM (KLONOPIN) 1 MG tablet 2 mg. TAKE 1 TABLET BY MOUTH AT BEDTIME  . [DISCONTINUED] FLUoxetine (PROZAC) 40 MG capsule TAKE 1 CAPSULE (40 MG TOTAL) BY MOUTH AT BEDTIME.    Recent Results (from the past 2160 hour(s))  CBC WITH DIFFERENTIAL     Status: None   Collection Time    04/04/14  6:53 PM      Result Value Ref Range   WBC 8.9  4.0 - 10.5 K/uL   RBC 4.22  3.87 - 5.11 MIL/uL   Hemoglobin 12.3  12.0 - 15.0 g/dL   HCT 36.7  36.0 - 46.0 %   MCV 87.0  78.0 - 100.0 fL   MCH 29.1  26.0 - 34.0 pg   MCHC 33.5  30.0 - 36.0 g/dL   RDW 12.3  11.5 - 15.5 %   Platelets 278  150 - 400 K/uL   Neutrophils Relative % 68  43 - 77 %   Neutro Abs 6.1  1.7 - 7.7 K/uL   Lymphocytes Relative 25  12 - 46 %   Lymphs Abs 2.2  0.7 - 4.0 K/uL   Monocytes Relative 5  3 - 12 %   Monocytes Absolute 0.5  0.1 - 1.0 K/uL   Eosinophils Relative 2  0 - 5 %   Eosinophils Absolute 0.1  0.0 - 0.7 K/uL   Basophils Relative 0  0 - 1 %   Basophils Absolute 0.0  0.0 - 0.1 K/uL  BASIC METABOLIC PANEL     Status: None   Collection Time    04/04/14  6:53 PM      Result Value Ref Range   Sodium 140  137 - 147 mEq/L   Potassium 3.7  3.7 - 5.3 mEq/L   Chloride 103  96 - 112 mEq/L   CO2 24  19 - 32 mEq/L   Glucose, Bld 95  70 - 99 mg/dL   BUN 13  6 - 23 mg/dL   Creatinine, Ser 0.84  0.50 - 1.10 mg/dL   Calcium 9.3  8.4 - 10.5 mg/dL   GFR calc non Af Amer >90  >90 mL/min   GFR calc Af Amer >90  >90 mL/min   Comment: (NOTE)     The eGFR has been calculated using the CKD EPI equation.     This calculation has not been validated in all clinical situations.     eGFR's persistently <90 mL/min signify possible Chronic Kidney     Disease.   Anion  gap 13  5 - 15  HCG, SERUM, QUALITATIVE     Status: None   Collection Time    04/04/14  6:53 PM      Result Value Ref Range   Preg, Serum NEGATIVE  NEGATIVE   Comment:  THE SENSITIVITY OF THIS     METHODOLOGY IS >10 mIU/mL.  URINALYSIS, ROUTINE W REFLEX MICROSCOPIC     Status: Abnormal   Collection Time    04/04/14  7:07 PM      Result Value Ref Range   Color, Urine AMBER (*) YELLOW   Comment: BIOCHEMICALS MAY BE AFFECTED BY COLOR   APPearance CLOUDY (*) CLEAR   Specific Gravity, Urine 1.036 (*) 1.005 - 1.030   pH 5.0  5.0 - 8.0   Glucose, UA NEGATIVE  NEGATIVE mg/dL   Hgb urine dipstick NEGATIVE  NEGATIVE   Bilirubin Urine SMALL (*) NEGATIVE   Ketones, ur NEGATIVE  NEGATIVE mg/dL   Protein, ur NEGATIVE  NEGATIVE mg/dL   Urobilinogen, UA 0.2  0.0 - 1.0 mg/dL   Nitrite NEGATIVE  NEGATIVE   Leukocytes, UA NEGATIVE  NEGATIVE   Comment: MICROSCOPIC NOT DONE ON URINES WITH NEGATIVE PROTEIN, BLOOD, LEUKOCYTES, NITRITE, OR GLUCOSE <1000 mg/dL.      Physical Exam: Constitutional:  BP 116/71  Pulse 90  Ht 5' 1.5" (1.562 m)  Wt 198 lb 12.8 oz (90.175 kg)  BMI 36.96 kg/m2  Musculoskeletal: Strength & Muscle Tone: within normal limits Gait & Station: normal Patient leans: N/A  Mental Status Examination;  Patient is a young female who is overweight, casually dressed and groomed.  She appears anxious but cooperative.  Her speech is clear and coherent.  Her thought processes logical and goal-directed.  She described her mood anxious and her affect is constricted.  She denies any active or passive suicidal thoughts or homicidal thoughts she denies any auditory or visual hallucination.  There were no delusions. Her attention and concentration is fair.  She gets easily distracted.  She is alert and oriented x3.  Her insight judgment and impulse control is okay.   New problem, with additional work up planned, Review of Psycho-Social Stressors (1), Review or order clinical lab  tests (1), Review and summation of old records (2), Review of Last Therapy Session (1) and Review of Medication Regimen & Side Effects (2)  Assessment: Axis I: Obsessive-compulsive disorder, anxiety disorder NOS, ADHD  Axis II: Deferred  Axis III:  Past Medical History  Diagnosis Date  . MIGRAINES, HX OF 06/24/2007  . Gallstones   . IBS (irritable bowel syndrome)     doesn't take any meds  . OCD (obsessive compulsive disorder)     takes Prozac daily  . Anxiety     takes Klonopin nightly  . ASTHMA NOS W/ACUTE EXACERBATION 01/05/2010    exercise induced  . History of migraine     last one a couple of days ago  . Coarse tremors   . Seizures     takes Lamictal daily;last seizure was in 11/14/10  . Degenerative disc disease     back  . Back pain   . GERD (gastroesophageal reflux disease)     but doesn't take anything for it  . Urinary frequency   . Medullary sponge kidney 2007    kidneys can be sick but she isnt sick  . PYELONEPHRITIS 06/24/2007  . History of UTI     frequent  . History of kidney stones   . Anemia     takes Ferrous Sulfate daily  . Narcolepsy   . 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    Axis IV: Moderate   Plan:  I reviewed records , blood results and her current medication.  Her recent lab shows normal BUN creatinine.  Her neurologist is trying to  adjust medication for her narcolepsy.  She tried Provigil however it did not work and now she is taking Adderall 10 mg.  She is also taking Lamictal which is prescribed by her neurologist for seizures.  She is seizures free in a while. I recommended to try Klonopin 1 mg half to one tablet 2 white excessive sedation during the day.  Continue Prozac 40 mg daily.  Her OCD is under control from Prozac.  Recommended to call us back if she has any question or any concern.  I will see her again in 3 months.Time spent 25 minutes.  More than 50% of the time spent in psychoeducation, counseling and coordination of care.  Discuss safety plan  that anytime having active suicidal thoughts or homicidal thoughts then patient need to call 911 or go to the local emergency room.   Novalyn Lajara T., MD 06/30/2014

## 2014-06-30 NOTE — Telephone Encounter (Signed)
I called the patient. Her Lamictal level came back as "none detected." patient states that she did not take it this past weekend because she went out of town and forgot it. I will have the patient back in 1 month to recheck the Lamictal level.

## 2014-07-02 ENCOUNTER — Telehealth: Payer: Self-pay | Admitting: Adult Health

## 2014-07-02 NOTE — Telephone Encounter (Signed)
Patient questioning when will amphetamine-dextroamphetamine (ADDERALL) 10 MG tablet take effect, still feels very tired.  Stopped taking clonazePAM (KLONOPIN) 1 MG tablet at night per Psychiatrist request.  Please call and advise, may leave detailed message if not available.

## 2014-07-03 NOTE — Telephone Encounter (Signed)
Spoke to patient and relayed that she can try taking two tablets daily for a total of 20mg  of Adderall, per Aundra MilletMegan.  Patient verbalized understanding.

## 2014-07-03 NOTE — Telephone Encounter (Signed)
I started the patient on a low dose of adderall. She can try taking two tablets daily for a total of 20 mg. Please call and let her know.

## 2014-07-05 ENCOUNTER — Ambulatory Visit (INDEPENDENT_AMBULATORY_CARE_PROVIDER_SITE_OTHER): Payer: Medicaid Other

## 2014-07-05 DIAGNOSIS — E669 Obesity, unspecified: Secondary | ICD-10-CM

## 2014-07-05 DIAGNOSIS — R0683 Snoring: Secondary | ICD-10-CM

## 2014-07-05 DIAGNOSIS — G4710 Hypersomnia, unspecified: Secondary | ICD-10-CM

## 2014-07-05 DIAGNOSIS — R519 Headache, unspecified: Secondary | ICD-10-CM

## 2014-07-05 DIAGNOSIS — G47419 Narcolepsy without cataplexy: Secondary | ICD-10-CM

## 2014-07-05 DIAGNOSIS — R51 Headache: Secondary | ICD-10-CM

## 2014-07-07 ENCOUNTER — Telehealth: Payer: Self-pay | Admitting: Adult Health

## 2014-07-07 NOTE — Telephone Encounter (Signed)
Patient stated she had Sleep Sunday 10/11, patient took 20 mg of amphetamine-dextroamphetamine (ADDERALL) 10 MG tablet for Narcolepsy after waking.  Patient slept all day Monday.   Please call and advise, may leave detailed message on voice mail.

## 2014-07-07 NOTE — Telephone Encounter (Signed)
I called the patient. She had her sleep study Sunday night and the next morning she took her adderall. She states that it was not effective, states that she slept all day. I have advised that patient that she can give it a try for a couple more days if it is still not effective then we may have to switch her back to Provigil.

## 2014-07-13 ENCOUNTER — Telehealth: Payer: Self-pay | Admitting: Adult Health

## 2014-07-13 MED ORDER — AMPHETAMINE-DEXTROAMPHET ER 20 MG PO CP24: 20.0000 mg | ORAL_CAPSULE | Freq: Every day | ORAL | Status: DC

## 2014-07-13 NOTE — Telephone Encounter (Signed)
Patient calling to discuss trying 30 mg of extended release Adderall since her current dosage wears off at 2 pm. Patient states she likes Adderall much better than Provigil. Please return call and advise.

## 2014-07-13 NOTE — Telephone Encounter (Signed)
Patient is currently taking Adderall immediate release 20 mg daily. She states that this is working well except it wears off by 2:00pm. I will change her prescription to Adderall ER 20 mg daily. She will let us know if this is beneficial.

## 2014-07-16 ENCOUNTER — Other Ambulatory Visit (HOSPITAL_COMMUNITY): Payer: Self-pay | Admitting: Psychiatry

## 2014-07-20 ENCOUNTER — Other Ambulatory Visit: Payer: Self-pay | Admitting: Adult Health

## 2014-07-20 ENCOUNTER — Telehealth: Payer: Self-pay | Admitting: Adult Health

## 2014-07-20 ENCOUNTER — Other Ambulatory Visit: Payer: Self-pay | Admitting: Neurology

## 2014-07-20 MED ORDER — LAMOTRIGINE ER 300 MG PO TB24: 300.0000 mg | ORAL_TABLET | Freq: Every day | ORAL | Status: DC

## 2014-07-20 NOTE — Telephone Encounter (Signed)
Patient is calling and wondering if all the narcolepsy medication she is taking could affect her menstrual period.

## 2014-07-20 NOTE — Telephone Encounter (Signed)
Prescription sent

## 2014-07-20 NOTE — Telephone Encounter (Signed)
I don't know of any correlation between adderall and the menstrual cycle. This question would be better suited for her OB-GYN. Please call the patient and let her know.

## 2014-07-20 NOTE — Telephone Encounter (Signed)
See phone note

## 2014-07-21 ENCOUNTER — Telehealth: Payer: Self-pay | Admitting: *Deleted

## 2014-07-21 NOTE — Telephone Encounter (Signed)
Patient contacted and provided the results of her overnight sleep study.  Patient was mailed a copy of test results and a copy was faxed to Dr. Lolly MustacheArfeen.  Patient instructed to contact our office if she would like to schedule a follow up visit once she reviews the entire report.

## 2014-07-21 NOTE — Telephone Encounter (Signed)
Spoke to patient and she is aware. She will call her OB/GYN just to make sure.

## 2014-07-26 ENCOUNTER — Telehealth: Payer: Self-pay | Admitting: Neurology

## 2014-07-27 ENCOUNTER — Encounter (HOSPITAL_COMMUNITY): Payer: Self-pay | Admitting: Psychiatry

## 2014-07-27 ENCOUNTER — Telehealth: Payer: Self-pay | Admitting: Adult Health

## 2014-07-27 NOTE — Telephone Encounter (Signed)
Lamicatal should not be weaned , as it is considered safer than having seizures. The question is how far along she is in pregnancy, as after 15 weeks a weaning is not longer useful.  Benzos are not recommended in any stage of pregancy , and should be gently weaned.   Ask Dr Lolly MustacheArfeen about how.  Adderall should be weaned, to evry other day for 8 days, than off. which means she needs to stop driving for the rest of the pregnancy .  CD

## 2014-07-27 NOTE — Telephone Encounter (Signed)
Patient stated she took two pregnancy test this weekend and both came back positive.  Questioning what to do about taking Narcolepsy and Seizure medication.  Please call and advise.

## 2014-07-27 NOTE — Telephone Encounter (Signed)
I called the patient and left a voice message. I let her know that I had received her message and forwarded the note to Dr. Vickey Hugerohmeier for her input. Once I hear from Dr. Vickey Hugerohmeier I will give her a call back.

## 2014-07-27 NOTE — Telephone Encounter (Signed)
Please see phone note  

## 2014-07-27 NOTE — Telephone Encounter (Signed)
Dr. Vickey Hugerohmeier, She is taking Lamictal and Adderall which are both category C for pregnancy. What is your recommendation, since the patient had a positive pregnancy test?

## 2014-07-28 ENCOUNTER — Other Ambulatory Visit: Payer: Self-pay

## 2014-07-28 ENCOUNTER — Telehealth (HOSPITAL_COMMUNITY): Payer: Self-pay | Admitting: *Deleted

## 2014-07-28 MED ORDER — LAMICTAL XR 300 MG PO TB24: 300.0000 mg | ORAL_TABLET | Freq: Every day | ORAL | Status: DC

## 2014-07-28 NOTE — Telephone Encounter (Signed)
error 

## 2014-07-28 NOTE — Telephone Encounter (Signed)
Patient left VM: Just found out she is pregnant. Takes Clonazepam and Fluoxetine -- Are these medicines safe to continue in pregnancy?

## 2014-07-28 NOTE — Telephone Encounter (Signed)
I called the patient. She will continue on the Lamictal. She will wean off the Adderall. She will take the medication every other day for 8 days then stop. The patient was concerned about not being able to drive for the next 9 months off of Adderall. I consulted with Dr. Vickey Hugerohmeier and we will reconsider adding medications after her first trimester. For now she should not be driving.

## 2014-07-28 NOTE — Telephone Encounter (Signed)
I returned patient's phone call.  She is [redacted] weeks pregnant.  I told to stop Klonopin and Prozac.  Discussed teratogenic effects.  She has not established care with OB/GYN at this time however she will soon.  I recommended to call us back once she had OB/GYN care so we can coordinate. She acknowledges and will call us back.

## 2014-07-28 NOTE — Telephone Encounter (Signed)
Patient has been on Brand Name previously.  Pharmacy requested a New Rx indicating this.

## 2014-07-28 NOTE — Addendum Note (Signed)
Addended by: Enedina FinnerMILLIKAN, Nekhi Liwanag P on: 07/28/2014 11:07 AM   Modules accepted: Orders, Medications

## 2014-07-29 ENCOUNTER — Telehealth: Payer: Self-pay | Admitting: Adult Health

## 2014-07-29 DIAGNOSIS — Z5181 Encounter for therapeutic drug level monitoring: Secondary | ICD-10-CM

## 2014-07-29 NOTE — Telephone Encounter (Signed)
I called the patient to let her know that it was time to recheck her Lamictal level. She can come within the next week to have this checked. She verbalized understanding.

## 2014-08-03 ENCOUNTER — Inpatient Hospital Stay (HOSPITAL_COMMUNITY)
Admission: AD | Admit: 2014-08-03 | Discharge: 2014-08-04 | Disposition: A | Discharge status: Home / Self Care | Payer: Medicaid Other | Source: Ambulatory Visit | Attending: Obstetrics & Gynecology | Admitting: Obstetrics & Gynecology

## 2014-08-03 DIAGNOSIS — O418X10 Other specified disorders of amniotic fluid and membranes, first trimester, not applicable or unspecified: Secondary | ICD-10-CM

## 2014-08-03 DIAGNOSIS — O26891 Other specified pregnancy related conditions, first trimester: Secondary | ICD-10-CM

## 2014-08-03 DIAGNOSIS — Z3A01 Less than 8 weeks gestation of pregnancy: Secondary | ICD-10-CM | POA: Insufficient documentation

## 2014-08-03 DIAGNOSIS — R102 Pelvic and perineal pain: Secondary | ICD-10-CM

## 2014-08-03 DIAGNOSIS — O468X1 Other antepartum hemorrhage, first trimester: Secondary | ICD-10-CM | POA: Insufficient documentation

## 2014-08-04 ENCOUNTER — Inpatient Hospital Stay (HOSPITAL_COMMUNITY): Payer: Medicaid Other

## 2014-08-04 ENCOUNTER — Encounter (HOSPITAL_COMMUNITY): Payer: Self-pay

## 2014-08-04 DIAGNOSIS — O43891 Other placental disorders, first trimester: Secondary | ICD-10-CM

## 2014-08-04 DIAGNOSIS — Z3A01 Less than 8 weeks gestation of pregnancy: Secondary | ICD-10-CM | POA: Diagnosis not present

## 2014-08-04 DIAGNOSIS — O468X1 Other antepartum hemorrhage, first trimester: Secondary | ICD-10-CM | POA: Diagnosis present

## 2014-08-04 LAB — URINALYSIS, ROUTINE W REFLEX MICROSCOPIC
BILIRUBIN URINE: NEGATIVE
Glucose, UA: NEGATIVE mg/dL
HGB URINE DIPSTICK: NEGATIVE
Ketones, ur: 15 mg/dL — AB
Leukocytes, UA: NEGATIVE
Nitrite: NEGATIVE
PH: 5.5 (ref 5.0–8.0)
Protein, ur: NEGATIVE mg/dL
Specific Gravity, Urine: >1.03 — ABNORMAL HIGH (ref 1.005–1.030)
Urobilinogen, UA: 0.2 mg/dL (ref 0.0–1.0)

## 2014-08-04 LAB — CBC
HCT: 37.8 % (ref 36.0–46.0)
HEMOGLOBIN: 12.6 g/dL (ref 12.0–15.0)
MCH: 29.7 pg (ref 26.0–34.0)
MCHC: 33.3 g/dL (ref 30.0–36.0)
MCV: 89.2 fL (ref 78.0–100.0)
Platelets: 261 10*3/uL (ref 150–400)
RBC: 4.24 MIL/uL (ref 3.87–5.11)
RDW: 13 % (ref 11.5–15.5)
WBC: 9.3 10*3/uL (ref 4.0–10.5)

## 2014-08-04 LAB — HCG, QUANTITATIVE, PREGNANCY: hCG, Beta Chain, Quant, S: 16489 m[IU]/mL — ABNORMAL HIGH (ref <5)

## 2014-08-04 MED ORDER — ACETAMINOPHEN 500 MG PO TABS
1000.0000 mg | ORAL_TABLET | Freq: Once | ORAL | Status: AC
  Administered 2014-08-04: 1000 mg via ORAL
  Filled 2014-08-04: qty 2

## 2014-08-04 NOTE — MAU Note (Signed)
Pelvic and back pain since 9:15 pm.  No bleeding.  First appt at [facility] tomorrow.

## 2014-08-04 NOTE — Discharge Instructions (Signed)
First Trimester of Pregnancy The first trimester of pregnancy is from week 1 until the end of week 12 (months 1 through 3). A week after a sperm fertilizes an egg, the egg will implant on the wall of the uterus. This embryo will begin to develop into a baby. Genes from you and your partner are forming the baby. The female genes determine whether the baby is a boy or a girl. At 6-8 weeks, the eyes and face are formed, and the heartbeat can be seen on ultrasound. At the end of 12 weeks, all the baby's organs are formed.  Now that you are pregnant, you will want to do everything you can to have a healthy baby. Two of the most important things are to get good prenatal care and to follow your health care provider's instructions. Prenatal care is all the medical care you receive before the baby's birth. This care will help prevent, find, and treat any problems during the pregnancy and childbirth. BODY CHANGES Your body goes through many changes during pregnancy. The changes vary from woman to woman.   You may gain or lose a couple of pounds at first.  You may feel sick to your stomach (nauseous) and throw up (vomit). If the vomiting is uncontrollable, call your health care provider.  You may tire easily.  You may develop headaches that can be relieved by medicines approved by your health care provider.  You may urinate more often. Painful urination may mean you have a bladder infection.  You may develop heartburn as a result of your pregnancy.  You may develop constipation because certain hormones are causing the muscles that push waste through your intestines to slow down.  You may develop hemorrhoids or swollen, bulging veins (varicose veins).  Your breasts may begin to grow larger and become tender. Your nipples may stick out more, and the tissue that surrounds them (areola) may become darker.  Your gums may bleed and may be sensitive to brushing and flossing.  Dark spots or blotches (chloasma,  mask of pregnancy) may develop on your face. This will likely fade after the baby is born.  Your menstrual periods will stop.  You may have a loss of appetite.  You may develop cravings for certain kinds of food.  You may have changes in your emotions from day to day, such as being excited to be pregnant or being concerned that something may go wrong with the pregnancy and baby.  You may have more vivid and strange dreams.  You may have changes in your hair. These can include thickening of your hair, rapid growth, and changes in texture. Some women also have hair loss during or after pregnancy, or hair that feels dry or thin. Your hair will most likely return to normal after your baby is born. WHAT TO EXPECT AT YOUR PRENATAL VISITS During a routine prenatal visit:  You will be weighed to make sure you and the baby are growing normally.  Your blood pressure will be taken.  Your abdomen will be measured to track your baby's growth.  The fetal heartbeat will be listened to starting around week 10 or 12 of your pregnancy.  Test results from any previous visits will be discussed. Your health care provider may ask you:  How you are feeling.  If you are feeling the baby move.  If you have had any abnormal symptoms, such as leaking fluid, bleeding, severe headaches, or abdominal cramping.  If you have any questions. Other tests   that may be performed during your first trimester include:  Blood tests to find your blood type and to check for the presence of any previous infections. They will also be used to check for low iron levels (anemia) and Rh antibodies. Later in the pregnancy, blood tests for diabetes will be done along with other tests if problems develop.  Urine tests to check for infections, diabetes, or protein in the urine.  An ultrasound to confirm the proper growth and development of the baby.  An amniocentesis to check for possible genetic problems.  Fetal screens for  spina bifida and Down syndrome.  You may need other tests to make sure you and the baby are doing well. HOME CARE INSTRUCTIONS  Medicines  Follow your health care provider's instructions regarding medicine use. Specific medicines may be either safe or unsafe to take during pregnancy.  Take your prenatal vitamins as directed.  If you develop constipation, try taking a stool softener if your health care provider approves. Diet  Eat regular, well-balanced meals. Choose a variety of foods, such as meat or vegetable-based protein, fish, milk and low-fat dairy products, vegetables, fruits, and whole grain breads and cereals. Your health care provider will help you determine the amount of weight gain that is right for you.  Avoid raw meat and uncooked cheese. These carry germs that can cause birth defects in the baby.  Eating four or five small meals rather than three large meals a day may help relieve nausea and vomiting. If you start to feel nauseous, eating a few soda crackers can be helpful. Drinking liquids between meals instead of during meals also seems to help nausea and vomiting.  If you develop constipation, eat more high-fiber foods, such as fresh vegetables or fruit and whole grains. Drink enough fluids to keep your urine clear or pale yellow. Activity and Exercise  Exercise only as directed by your health care provider. Exercising will help you:  Control your weight.  Stay in shape.  Be prepared for labor and delivery.  Experiencing pain or cramping in the lower abdomen or low back is a good sign that you should stop exercising. Check with your health care provider before continuing normal exercises.  Try to avoid standing for long periods of time. Move your legs often if you must stand in one place for a long time.  Avoid heavy lifting.  Wear low-heeled shoes, and practice good posture.  You may continue to have sex unless your health care provider directs you  otherwise. Relief of Pain or Discomfort  Wear a good support bra for breast tenderness.   Take warm sitz baths to soothe any pain or discomfort caused by hemorrhoids. Use hemorrhoid cream if your health care provider approves.   Rest with your legs elevated if you have leg cramps or low back pain.  If you develop varicose veins in your legs, wear support hose. Elevate your feet for 15 minutes, 3-4 times a day. Limit salt in your diet. Prenatal Care  Schedule your prenatal visits by the twelfth week of pregnancy. They are usually scheduled monthly at first, then more often in the last 2 months before delivery.  Write down your questions. Take them to your prenatal visits.  Keep all your prenatal visits as directed by your health care provider. Safety  Wear your seat belt at all times when driving.  Make a list of emergency phone numbers, including numbers for family, friends, the hospital, and police and fire departments. General Tips    Ask your health care provider for a referral to a local prenatal education class. Begin classes no later than at the beginning of month 6 of your pregnancy.  Ask for help if you have counseling or nutritional needs during pregnancy. Your health care provider can offer advice or refer you to specialists for help with various needs.  Do not use hot tubs, steam rooms, or saunas.  Do not douche or use tampons or scented sanitary pads.  Do not cross your legs for long periods of time.  Avoid cat litter boxes and soil used by cats. These carry germs that can cause birth defects in the baby and possibly loss of the fetus by miscarriage or stillbirth.  Avoid all smoking, herbs, alcohol, and medicines not prescribed by your health care provider. Chemicals in these affect the formation and growth of the baby.  Schedule a dentist appointment. At home, brush your teeth with a soft toothbrush and be gentle when you floss. SEEK MEDICAL CARE IF:   You have  dizziness.  You have mild pelvic cramps, pelvic pressure, or nagging pain in the abdominal area.  You have persistent nausea, vomiting, or diarrhea.  You have a bad smelling vaginal discharge.  You have pain with urination.  You notice increased swelling in your face, hands, legs, or ankles. SEEK IMMEDIATE MEDICAL CARE IF:   You have a fever.  You are leaking fluid from your vagina.  You have spotting or bleeding from your vagina.  You have severe abdominal cramping or pain.  You have rapid weight gain or loss.  You vomit blood or material that looks like coffee grounds.  You are exposed to German measles and have never had them.  You are exposed to fifth disease or chickenpox.  You develop a severe headache.  You have shortness of breath.  You have any kind of trauma, such as from a fall or a car accident. Document Released: 09/05/2001 Document Revised: 01/26/2014 Document Reviewed: 07/22/2013 Exit[employer] Patient Information 2015 [employer]. This information is not intended to replace advice given to you by your health care provider. Make sure you discuss any questions you have with your health care provider.  

## 2014-08-04 NOTE — MAU Provider Note (Signed)
History     CSN: [id number]  Arrival date and time: 08/03/14 2356   First Provider Initiated Contact with Patient 08/04/14 0044      Chief Complaint  Patient presents with  . Abdominal Pain   HPI  [name] is a 28 y.o. G3P2002 at [redacted]w[redacted]d who presents today with abdominal pain. She states that the pain started at 2115. She denies any vaginal bleeding. She has an appointment to start [facility] tomorrow.   Past Medical History  Diagnosis Date  . MIGRAINES, HX OF 06/24/2007  . Gallstones   . IBS (irritable bowel syndrome)     doesn't take any meds  . OCD (obsessive compulsive disorder)     takes Prozac daily  . Anxiety     takes Klonopin nightly  . ASTHMA NOS W/ACUTE EXACERBATION 01/05/2010    exercise induced  . History of migraine     last one a couple of days ago  . Coarse tremors   . Seizures     takes Lamictal daily;last seizure was in 11/14/10  . Degenerative disc disease     back  . Back pain   . GERD (gastroesophageal reflux disease)     but doesn't take anything for it  . Urinary frequency   . Medullary sponge kidney 2007    kidneys can be sick but she isnt sick  . PYELONEPHRITIS 06/24/2007  . History of UTI     frequent  . History of kidney stones   . Anemia     takes Ferrous Sulfate daily  . Narcolepsy   . 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 Back surgery    . Lumbar laminectomy/decompression microdiscectomy  07/24/2012    Procedure: LUMBAR LAMINECTOMY/DECOMPRESSION MICRODISCECTOMY;  Surgeon: Jacki Cones, MD;  Location: [facility] ORS;  Service: Orthopedics;  Laterality: Right;  L5-S1  . Colonoscopy    . Cholecystectomy N/A 12/02/2013    Procedure: LAPAROSCOPIC CHOLECYSTECTOMY WITH ATTEMPTED INTRAOPERATIVE CHOLANGIOGRAM;  Surgeon: Atilano Ina, MD;  Location: [facility] OR;  Service: General;  Laterality: N/A;    Family History  Problem Relation Age of Onset  . Anesthesia problems Neg Hx   . Heart disease Mother   . ADD / ADHD  Sister     History  Substance Use Topics  . Smoking status: Never Smoker   . Smokeless tobacco: Never Used  . Alcohol Use: Yes     Comment: occas.    Allergies:  Allergies  Allergen Reactions  . Other Anaphylaxis    balsalmic vingerette  . Soy Allergy Anaphylaxis  . Ciprofloxacin Other (See Comments)    Patient stated that her arm started burning really intensely.  . Nsaids Other (See Comments)    Told to avoid NSAIDS due to her kidney disorder.  . Topiramate Other (See Comments)    Reaction unknown  . Triptans     Happened maybe in '05 possibly, reaction unsure.   . Adhesive [Tape] Rash    Prescriptions prior to admission  Medication Sig Dispense Refill Last Dose  . clonazePAM (KLONOPIN) 1 MG tablet Take 1 tablet (1 mg total) by mouth at bedtime. TAKE 1 TABLET BY MOUTH AT BEDTIME 30 tablet 2   . FLUoxetine (PROZAC) 40 MG capsule Take 1 capsule (40 mg total) by mouth daily. 30 capsule 2   . gabapentin (NEURONTIN) 300 MG capsule Take 300 mg by mouth daily.   Taking  . LAMICTAL XR 300 MG TB24 Take 1 tablet (  300 mg total) by mouth daily. 30 tablet 5   . Norethindrone-Ethinyl Estradiol-Fe (GENERESS FE) 0.8-25 MG-MCG tablet Chew 1 tablet by mouth at bedtime.    Taking    ROS Physical Exam   Blood pressure 126/63, pulse 99, temperature 98.5 F (36.9 C), temperature source Oral, resp. rate 18, height 5\' 1"  (1.549 m), weight 88.905 kg (196 lb), last menstrual period 06/19/2014, SpO2 100 %.  Physical Exam  MAU Course  Procedures  Results for orders placed or performed during the hospital encounter of 08/03/14 (from the past 24 hour(s))  CBC     Status: None   Collection Time: 08/04/14 12:22 AM  Result Value Ref Range   WBC 9.3 4.0 - 10.5 K/uL   RBC 4.24 3.87 - 5.11 MIL/uL   Hemoglobin 12.6 12.0 - 15.0 g/dL   HCT 16.137.8 09.636.0 - 04.546.0 %   MCV 89.2 78.0 - 100.0 fL   MCH 29.7 26.0 - 34.0 pg   MCHC 33.3 30.0 - 36.0 g/dL   RDW 40.913.0 81.111.5 - 91.415.5 %   Platelets 261 150 - 400 K/uL   hCG, quantitative, pregnancy     Status: Abnormal   Collection Time: 08/04/14 12:22 AM  Result Value Ref Range   hCG, Beta Chain, Quant, S 16489 (H) <5 mIU/mL  Urinalysis, Routine w reflex microscopic     Status: Abnormal   Collection Time: 08/04/14 12:25 AM  Result Value Ref Range   Color, Urine YELLOW YELLOW   APPearance CLEAR CLEAR   Specific Gravity, Urine >1.030 (H) 1.005 - 1.030   pH 5.5 5.0 - 8.0   Glucose, UA NEGATIVE NEGATIVE mg/dL   Hgb urine dipstick NEGATIVE NEGATIVE   Bilirubin Urine NEGATIVE NEGATIVE   Ketones, ur 15 (A) NEGATIVE mg/dL   Protein, ur NEGATIVE NEGATIVE mg/dL   Urobilinogen, UA 0.2 0.0 - 1.0 mg/dL   Nitrite NEGATIVE NEGATIVE   Leukocytes, UA NEGATIVE NEGATIVE   [place] Ob Comp Less 14 Wks  08/04/2014   CLINICAL DATA:  Pregnant patient with pelvic pain. Quantitative HCG 16,489.  EXAM: OBSTETRIC <14 WK US AND TRANSVAGINAL OB US  TECHNIQUE: Both transabdominal and transvaginal ultrasound examinations were performed for complete evaluation of the gestation as well as the maternal uterus, adnexal regions, and pelvic cul-de-sac. Transvaginal technique was performed to assess early pregnancy.  COMPARISON:  None.  FINDINGS: Intrauterine gestational sac: Visualized/normal in shape. Small subchorionic hemorrhage is identified measuring 1.7 x 0.8 x 0.7 cm.  Yolk sac:  Visualized.  Embryo:  Visualized.  Cardiac Activity: Detected.  Heart Rate:  121 bpm  CRL:   0.53  mm   6 w 3 d                  US EDC: 03/27/2015.  Maternal uterus/adnexae: Unremarkable.  IMPRESSION: Single living intrauterine pregnancy. Small subchorionic hemorrhage is noted. The examination is otherwise negative.   Electronically Signed   By: Drusilla Kannerhomas  Dalessio M.D.   On: 08/04/2014 01:31   [place] Ob Transvaginal  08/04/2014   CLINICAL DATA:  Pregnant patient with pelvic pain. Quantitative HCG 16,489.  EXAM: OBSTETRIC <14 WK US AND TRANSVAGINAL OB US  TECHNIQUE: Both transabdominal and transvaginal ultrasound  examinations were performed for complete evaluation of the gestation as well as the maternal uterus, adnexal regions, and pelvic cul-de-sac. Transvaginal technique was performed to assess early pregnancy.  COMPARISON:  None.  FINDINGS: Intrauterine gestational sac: Visualized/normal in shape. Small subchorionic hemorrhage is identified measuring 1.7 x 0.8 x 0.7 cm.  Yolk sac:  Visualized.  Embryo:  Visualized.  Cardiac Activity: Detected.  Heart Rate:  121 bpm  CRL:   0.53  mm   6 w 3 d                  US EDC: 03/27/2015.  Maternal uterus/adnexae: Unremarkable.  IMPRESSION: Single living intrauterine pregnancy. Small subchorionic hemorrhage is noted. The examination is otherwise negative.   Electronically Signed   By: Drusilla Kannerhomas  Dalessio M.D.   On: 08/04/2014 01:31     Assessment and Plan   1. Subchorionic hematoma in first trimester   2. Pelvic pain affecting pregnancy in first trimester, antepartum    Reassurance provided First trimester precautions reviewed Return to MAU as needed  Follow-up Information    Follow up with MORRIS, MEGAN, DO.   Specialty:  Obstetrics and Gynecology   Why:  As scheduled   Contact information:   [address], [address], [address] [place] [address] [phone]        Tawnya CrookHogan, Jahnasia Tatum Donovan 08/04/2014, 1:22 AM

## 2014-08-10 ENCOUNTER — Ambulatory Visit: Payer: Self-pay | Admitting: Nurse Practitioner

## 2014-08-10 ENCOUNTER — Ambulatory Visit: Payer: Medicaid Other | Admitting: Adult Health

## 2014-08-11 ENCOUNTER — Encounter: Payer: Self-pay | Admitting: *Deleted

## 2014-08-24 ENCOUNTER — Encounter (HOSPITAL_COMMUNITY): Payer: Self-pay | Admitting: *Deleted

## 2014-08-24 ENCOUNTER — Inpatient Hospital Stay (HOSPITAL_COMMUNITY): Payer: Medicaid Other

## 2014-08-24 ENCOUNTER — Inpatient Hospital Stay (HOSPITAL_COMMUNITY)
Admission: AD | Admit: 2014-08-24 | Discharge: 2014-08-24 | Disposition: A | Discharge status: Home / Self Care | Payer: Medicaid Other | Source: Ambulatory Visit | Attending: Obstetrics and Gynecology | Admitting: Obstetrics and Gynecology

## 2014-08-24 ENCOUNTER — Encounter: Payer: Self-pay | Admitting: Neurology

## 2014-08-24 DIAGNOSIS — G8929 Other chronic pain: Secondary | ICD-10-CM | POA: Diagnosis not present

## 2014-08-24 DIAGNOSIS — M549 Dorsalgia, unspecified: Secondary | ICD-10-CM | POA: Insufficient documentation

## 2014-08-24 DIAGNOSIS — N898 Other specified noninflammatory disorders of vagina: Secondary | ICD-10-CM | POA: Diagnosis present

## 2014-08-24 DIAGNOSIS — O26899 Other specified pregnancy related conditions, unspecified trimester: Secondary | ICD-10-CM

## 2014-08-24 DIAGNOSIS — Z3A09 9 weeks gestation of pregnancy: Secondary | ICD-10-CM | POA: Diagnosis not present

## 2014-08-24 DIAGNOSIS — O26891 Other specified pregnancy related conditions, first trimester: Secondary | ICD-10-CM

## 2014-08-24 DIAGNOSIS — O9989 Other specified diseases and conditions complicating pregnancy, childbirth and the puerperium: Secondary | ICD-10-CM | POA: Diagnosis not present

## 2014-08-24 DIAGNOSIS — R109 Unspecified abdominal pain: Secondary | ICD-10-CM | POA: Diagnosis not present

## 2014-08-24 LAB — WET PREP, GENITAL
Clue Cells Wet Prep HPF POC: NONE SEEN
Trich, Wet Prep: NONE SEEN
Yeast Wet Prep HPF POC: NONE SEEN

## 2014-08-24 LAB — URINALYSIS, ROUTINE W REFLEX MICROSCOPIC
Bilirubin Urine: NEGATIVE
Glucose, UA: NEGATIVE mg/dL
Hgb urine dipstick: NEGATIVE
KETONES UR: NEGATIVE mg/dL
LEUKOCYTES UA: NEGATIVE
NITRITE: NEGATIVE
PROTEIN: NEGATIVE mg/dL
Specific Gravity, Urine: 1.015 (ref 1.005–1.030)
UROBILINOGEN UA: 0.2 mg/dL (ref 0.0–1.0)
pH: 7.5 (ref 5.0–8.0)

## 2014-08-24 MED ORDER — OXYCODONE-ACETAMINOPHEN 5-325 MG PO TABS: 1.0000 | ORAL_TABLET | Freq: Four times a day (QID) | ORAL | Status: DC | PRN

## 2014-08-24 NOTE — Discharge Instructions (Signed)
Abdominal Pain During Pregnancy °Belly (abdominal) pain is common during pregnancy. Most of the time, it is not a serious problem. Other times, it can be a sign that something is wrong with the pregnancy. Always tell your doctor if you have belly pain. °HOME CARE °Monitor your belly pain for any changes. The following actions may help you feel better: °· Do not have sex (intercourse) or put anything in your vagina until you feel better. °· Rest until your pain stops. °· Drink clear fluids if you feel sick to your stomach (nauseous). Do not eat solid food until you feel better. °· Only take medicine as told by your doctor. °· Keep all doctor visits as told. °GET HELP RIGHT AWAY IF:  °· You are bleeding, leaking fluid, or pieces of tissue come out of your vagina. °· You have more pain or cramping. °· You keep throwing up (vomiting). °· You have pain when you pee (urinate) or have blood in your pee. °· You have a fever. °· You do not feel your baby moving as much. °· You feel very weak or feel like passing out. °· You have trouble breathing, with or without belly pain. °· You have a very bad headache and belly pain. °· You have fluid leaking from your vagina and belly pain. °· You keep having watery poop (diarrhea). °· Your belly pain does not go away after resting, or the pain gets worse. °MAKE SURE YOU:  °· Understand these instructions. °· Will watch your condition. °· Will get help right away if you are not doing well or get worse. °Document Released: 08/30/2009 Document Revised: 05/14/2013 Document Reviewed: 04/10/2013 °Exit[employer]® Patient Information ©2015 [employer]. This information is not intended to replace advice given to you by your health care provider. Make sure you discuss any questions you have with your health care provider. ° °

## 2014-08-24 NOTE — MAU Provider Note (Signed)
History     CSN: [id number]  Arrival date and time: 08/24/14 1706   First Provider Initiated Contact with Patient 08/24/14 1752      Chief Complaint  Patient presents with  . Vaginal Discharge   HPI  Ms. [name] is a 28 y.o. G3P2002 at [redacted]w[redacted]d who presents to MAU today with complaint of LOF. The patient states that she noted a scant amount of blood with wiping around 1500. She put on a pad and then noted a gush of fluid around 1530 that soaked the pad and her underwear. She states another smaller gush later and a continued off and on trickle of fluid since then. She states severe low back pain, which is consistent with her chronic DDD pain. She also states mild to moderate lower abdominal cramping. She denies any vaginal bleeding since one episode of spotting around 1500. She states frequent nausea with occasional vomiting which has been consistent throughout the pregnancy. She denies fever today.   OB History    Gravida Para Term Preterm AB TAB SAB Ectopic Multiple Living   3 2 2  0 0 0 0 0 0 2      Past Medical History  Diagnosis Date  . MIGRAINES, HX OF 06/24/2007  . Gallstones   . IBS (irritable bowel syndrome)     doesn't take any meds  . OCD (obsessive compulsive disorder)     takes Prozac daily  . Anxiety     takes Klonopin nightly  . ASTHMA NOS W/ACUTE EXACERBATION 01/05/2010    exercise induced  . History of migraine     last one a couple of days ago  . Coarse tremors   . Seizures     takes Lamictal daily;last seizure was in 11/14/10  . Degenerative disc disease     back  . Back pain   . GERD (gastroesophageal reflux disease)     but doesn't take anything for it  . Urinary frequency   . Medullary sponge kidney 2007    kidneys can be sick but she isnt sick  . PYELONEPHRITIS 06/24/2007  . History of UTI     frequent  . History of kidney stones   . Anemia     takes Ferrous Sulfate daily  . Narcolepsy   . 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 Back surgery    . Lumbar laminectomy/decompression microdiscectomy  07/24/2012    Procedure: LUMBAR LAMINECTOMY/DECOMPRESSION MICRODISCECTOMY;  Surgeon: Jacki Cones, MD;  Location: [facility];  Service: Orthopedics;  Laterality: Right;  L5-S1  . Cholecystectomy N/A 12/02/2013    Procedure: LAPAROSCOPIC CHOLECYSTECTOMY WITH ATTEMPTED INTRAOPERATIVE CHOLANGIOGRAM;  Surgeon: Atilano Ina, MD;  Location: [facility];  Service: General;  Laterality: N/A;  . Wisdom tooth extraction      Family History  Problem Relation Age of Onset  . Anesthesia problems Neg Hx   . Heart disease Mother   . ADD / ADHD Sister     History  Substance Use Topics  . Smoking status: Never Smoker   . Smokeless tobacco: Never Used  . Alcohol Use: Yes     Comment: occas. when not pregnant    Allergies:  Allergies  Allergen Reactions  . Other Anaphylaxis    balsalmic vingerette  . Soy Allergy Anaphylaxis  . Ciprofloxacin Other (See Comments)    Patient stated that her arm started burning really intensely.  . Nsaids Other (See Comments)    Told to  avoid NSAIDS due to her kidney disorder.  . Topiramate Other (See Comments)    Reaction unknown  . Triptans     Happened maybe in '05 possibly, reaction unsure.   . Adhesive [Tape] Rash    Prescriptions prior to admission  Medication Sig Dispense Refill Last Dose  . amphetamine-dextroamphetamine (ADDERALL XR) 20 MG 24 hr capsule Take 20 mg by mouth daily.   Past Month at Unknown time  . clonazePAM (KLONOPIN) 1 MG tablet Take 1 tablet (1 mg total) by mouth at bedtime. TAKE 1 TABLET BY MOUTH AT BEDTIME 30 tablet 2 Past Week at Unknown time  . FLUoxetine (PROZAC) 40 MG capsule Take 1 capsule (40 mg total) by mouth daily. 30 capsule 2 Past Week at Unknown time  . gabapentin (NEURONTIN) 300 MG capsule Take 300 mg by mouth daily.   Past Week at Unknown time  . LAMICTAL XR 300 MG TB24 Take 1 tablet (300 mg total) by mouth daily. 30 tablet 5  08/24/2014 at Unknown time  . Norethindrone-Ethinyl Estradiol-Fe (GENERESS FE) 0.8-25 MG-MCG tablet Chew 1 tablet by mouth at bedtime.    Taking    Review of Systems  Constitutional: Negative for fever and malaise/fatigue.  Gastrointestinal: Positive for nausea, vomiting and abdominal pain.  Genitourinary: Negative for dysuria, urgency and frequency.       + vaginal bleeding, discharge   Physical Exam   Blood pressure 121/40, pulse 78, temperature 98.2 F (36.8 C), temperature source Oral, resp. rate 16, height 5' 1.5" (1.562 m), weight 192 lb 12.8 oz (87.454 kg), last menstrual period 06/19/2014, SpO2 100 %.  Physical Exam  Constitutional: She is oriented to person, place, and time. She appears well-developed and well-nourished. No distress.  HENT:  Head: Normocephalic.  Cardiovascular: Normal rate.   Respiratory: Effort normal.  GI: Soft. She exhibits no distension. There is tenderness (moderate tenderness to palpation to the lower abdomen).  Genitourinary: Vaginal discharge (small amount of clear mucus) found.  Cervix: 0.5cm/thick/posterier  Neurological: She is alert and oriented to person, place, and time.  Skin: Skin is warm and dry. No erythema.  Psychiatric: She has a normal mood and affect.     Results for orders placed or performed during the hospital encounter of 08/24/14 (from the past 24 hour(s))  Urinalysis, Routine w reflex microscopic     Status: None   Collection Time: 08/24/14  5:30 PM  Result Value Ref Range   Color, Urine YELLOW YELLOW   APPearance CLEAR CLEAR   Specific Gravity, Urine 1.015 1.005 - 1.030   pH 7.5 5.0 - 8.0   Glucose, UA NEGATIVE NEGATIVE mg/dL   Hgb urine dipstick NEGATIVE NEGATIVE   Bilirubin Urine NEGATIVE NEGATIVE   Ketones, ur NEGATIVE NEGATIVE mg/dL   Protein, ur NEGATIVE NEGATIVE mg/dL   Urobilinogen, UA 0.2 0.0 - 1.0 mg/dL   Nitrite NEGATIVE NEGATIVE   Leukocytes, UA NEGATIVE NEGATIVE  Wet prep, genital     Status: Abnormal    Collection Time: 08/24/14  6:10 PM  Result Value Ref Range   Yeast Wet Prep HPF POC NONE SEEN NONE SEEN   Trich, Wet Prep NONE SEEN NONE SEEN   Clue Cells Wet Prep HPF POC NONE SEEN NONE SEEN   WBC, Wet Prep HPF POC FEW (A) NONE SEEN   [place] Ob Transvaginal  08/24/2014   CLINICAL DATA:  28 year old pregnant female with pelvic pain and spotting. Estimated gestational age of [redacted] weeks 2 days by first ultrasound.  EXAM: TRANSVAGINAL OB  ULTRASOUND  TECHNIQUE: Transvaginal ultrasound was performed for complete evaluation of the gestation as well as the maternal uterus, adnexal regions, and pelvic cul-de-sac.  COMPARISON:  08/04/2014  FINDINGS: Intrauterine gestational sac: Visualized/normal in shape.  Yolk sac:  Visualized  Embryo:  Visualized  Cardiac Activity: Visualized  Heart Rate: 148 bpm  CRL:   26.3  mm   9 w 3 d                  US EDC: 03/26/2015  Maternal uterus/adnexae: A small subchorionic hemorrhage is noted.  Ovaries bilaterally are unremarkable.  There is no evidence of free fluid or adnexal mass.  IMPRESSION: Single living intrauterine gestation with estimated gestational age of [redacted] weeks 3 days by this ultrasound and 9 weeks 2 days by first ultrasound.  Small subchorionic hemorrhage.   Electronically Signed   By: Laveda AbbeJeff  Hu M.D.   On: 08/24/2014 19:53    MAU Course  Procedures  MDM Discussed patient with Dr. Renaldo FiddlerAdkins. Recommends US for evaluation of pregnancy, bleeding and fluid levels.  Discussed US results with Dr. Renaldo FiddlerAdkins. Ok for discharge with Rx for Percocet PRN for pain.  Assessment and Plan  A: Vaginal discharge in pregnancy Abdominal pain in pregnancy Chronic back pain  P: Discharge home Rx for Percocet given to patient Patient advised to follow-up with Dr. Renaldo FiddlerAdkins as scheduled or sooner PRN Patient may return to MAU as needed or if her condition were to change or worsen   Marny LowensteinJulie N Denyse Fillion, PA-C  08/24/2014, 8:15 PM

## 2014-08-24 NOTE — MAU Note (Signed)
Urine in lab 

## 2014-08-24 NOTE — MAU Note (Signed)
Patient states she has had several gushes of clear fluid since 1530, states she has saturated pads and had light spotting. Lower back pain and lower abdominal pain. Nausea and vomiting all the time.

## 2014-08-25 ENCOUNTER — Telehealth (HOSPITAL_COMMUNITY): Payer: Self-pay | Admitting: *Deleted

## 2014-08-25 ENCOUNTER — Encounter: Payer: Self-pay | Admitting: Adult Health

## 2014-08-25 ENCOUNTER — Ambulatory Visit (INDEPENDENT_AMBULATORY_CARE_PROVIDER_SITE_OTHER): Payer: Medicaid Other | Admitting: Adult Health

## 2014-08-25 VITALS — BP 115/68 | HR 77 | Temp 97.6°F | Ht 61.5 in | Wt 191.0 lb

## 2014-08-25 DIAGNOSIS — G47419 Narcolepsy without cataplexy: Secondary | ICD-10-CM

## 2014-08-25 DIAGNOSIS — R569 Unspecified convulsions: Secondary | ICD-10-CM

## 2014-08-25 DIAGNOSIS — Z3491 Encounter for supervision of normal pregnancy, unspecified, first trimester: Secondary | ICD-10-CM

## 2014-08-25 LAB — GC/CHLAMYDIA PROBE AMP
CT Probe RNA: NEGATIVE
GC Probe RNA: NEGATIVE

## 2014-08-25 NOTE — Patient Instructions (Signed)
Narcolepsy Narcolepsy is a nervous system disorder that causes daytime sleepiness and sudden bouts of irresistible sleep during the day (sleep attacks). Many people with narcolepsy live with extreme daytime sleepiness for many years before being diagnosed and treated. Narcolepsy is a lifelong (chronic) disorder.  You normally go through cycles when you sleep. When your sleep becomes deeper, you have less body movement, and you start dreaming. This type of deep sleep should happen after about 90 minutes of lighter sleep. Deep sleep is called rapid eye movement (REM) sleep. When you have narcolepsy, REM is not well regulated. It can happen as soon as you fall asleep, and components of REM sleep can occur during the day when you are awake. Uncontrolled REM sleep causes symptoms of narcolepsy. CAUSES Narcolepsy may be caused by an abnormality with a chemical messenger (neurotransmitter) in your brain that controls your sleep and wake cycles. Most people with narcolepsy have low levels of the neurotransmitter hypocretin. Hypocretin is important for controlling wakefulness.  A hypocretin imbalance may be caused by abnormal genes that are passed down through families. It may also develop if the body's defense system (immune system) mistakenly attacks the brain cells that produce hypocretin (autoimmune disease). RISK FACTORS You may be at higher risk for narcolepsy if you have a family history of the disease. Other risk factors that may contribute include:  Injuries, tumors, or infections in the areas of the brain that control sleep.  Exposure to toxins.  Stress.  Hormones produced during puberty and menopause.  Poor sleep habits. SIGNS AND SYMPTOMS  Symptoms of narcolepsy can start at any age but usually begin when people are between the ages of 7 and 25 years. There are four major symptoms. Not everyone with narcolepsy will have all four.   Excessive daytime sleepiness. This is the most common symptom  and is usually the first symptom you will notice.  You may feel as if you are in a mental fog.  Daytime sleepiness may severely affect your performance at work or school.  You may fall asleep during a conversation or while eating dinner.  Sudden loss of muscle tone (cataplexy). You do not lose consciousness, but you may suddenly lose muscle control. When this occurs, your speech may become slurred, or your knees may buckle. This symptom is usually triggered by surprise, anger, or laughter.  Sleep paralysis. You may lose the ability to speak or move just as you start to fall asleep or wake up. You will be aware of the paralysis. It usually lasts for just a few seconds or minutes.  Vivid hallucinations. These may occur with sleep paralysis. The hallucinations are like having bizarre or frightening dreams while you are still awake. Other symptoms may include:   Trouble staying asleep at night (insomnia).  Restless sleep.  Feeling a strong urge to get up at night to smoke or eat. DIAGNOSIS  Your health care provider can diagnose narcolepsy based on your symptoms and the results of two diagnostic tests. You may also be asked to keep a sleep diary for several weeks. You may need the tests if you have had daytime sleepiness for at least 3 months. The two tests are:   A polysomnogram to find out how well your REM sleep is regulated at night. This test is an overnight sleep study. It measures your heart rate, breathing, movement, and brain waves.  A multiple sleep latency test (MSLT) to find out how well your REM sleep is regulated during the day. This   is a daytime sleep study. You may need to take several naps during the day. This test also measures your heart rate, breathing, movement, and brain waves. TREATMENT  There is no cure for narcolepsy, but treatment can be very effective in helping manage the condition. Treatment may include:  Lifestyle and sleeping strategies to help cope with the  condition.  Medicines. These may include:  Stimulant medicines to fight daytime sleepiness.  Antidepressant medicines to treat cataplexy.  Sodium oxybate. This is a strong sedative that you take at night. It can help daytime sleepiness and cataplexy. HOME CARE INSTRUCTIONS  Take all medicines as directed by your health care provider.  Follow these sleep practices:  Try to get about 8 hours of sleep every night.  Go to sleep and get up close to the same time every day.  Keep your bedroom dark, quiet, and comfortable.  Schedule short naps for when you feel sleepiest during the day. Tell your employer or teachers that you have narcolepsy. You may be able to adjust your schedule to include time for naps.  Try to get at least 20 minutes of exercise every day. This will help you sleep better at night and reduce daytime sleepiness.  Do not drink alcohol or caffeinated beverages within 4-5 hours of bedtime.  Do not eat a heavy meal before bedtime. Eat at about the same times every day.  Do not smoke.  Do not drive if you are sleepy or have untreated narcolepsy.  Do not swim or go out on the water without a life jacket. SEEK MEDICAL CARE IF: Your medicines are not controlling your narcolepsy symptoms. SEEK IMMEDIATE MEDICAL CARE IF:  You hurt yourself during a sleep attack or an attack of cataplexy.  You have chest pain or trouble breathing. Document Released: 09/01/2002 Document Revised: 01/26/2014 Document Reviewed: 09/03/2013 ExitCare Patient Information 2015 [employer]. This information is not intended to replace advice given to you by your health care provider. Make sure you discuss any questions you have with your health care provider.  

## 2014-08-25 NOTE — Telephone Encounter (Signed)
Patient left message to call her. Phoned patient: She is [redacted] weeks pregnant. States she is just miserable. Very depressed, crying all the time, husband states she is not herself.Not feeling well emotionally at all.   OB-GYN told her to stop all meds except Lamictal for seizures so she is no longer on Prozac. OB suggested she just try to make the best of it until she makes it through the first trimester, but she does not think she can. Asked if she sees a therapist as emotional support would be helpful. States she is open to seeing therapist, but states she is not currently seeing a therapist, as she just didn't click with last therapist.  Informed her message will be placed in MD "In Basket" and someone will be in touch in the AM. She states she is willing to sign Release of Information for Dr. Lolly MustacheArfeen to talk with OB if that will help.

## 2014-08-25 NOTE — Progress Notes (Signed)
I agree with the assessment and plan as directed by NP . The patient's condition of narcolepsy cannot be safely treated with the current medication during a pregnancy. None , other than melatonin and unisom or benadryl are considered safe .   Her depression is currently her worst symptom, and  she needs to address this with Dr Lolly MustacheArfeen, her psychiatrist.  The patient is known to me .   Rachael Tukes, MD

## 2014-08-25 NOTE — Progress Notes (Signed)
PATIENT: [name] DOB: 1986-02-13  REASON FOR VISIT: follow up HISTORY FROM: patient  HISTORY OF PRESENT ILLNESS: [name] is a 28 year old female with a history of seizures and narcolepsy. She returns today for follow-up. The patient is currently [redacted] weeks pregnant. She was taken off her medication for narcolepsy until after the first trimester. She continues to take Lamictal for seizures. Patient is very tearful during the visit. She states that she is very depressed since coming off her adderall. She states that she is very tired throughout the day and her husband has even noticed a changed in her mood. She states that she has even had thoughts of "getting rid of the baby" so she can feel normal. She denies any seizure events and is tolerating Lamictal fine.  Other than the pregnancy,no new medical history since last seen.   HISTORY 06/29/14: [name] is a 28 year old female with a history of seizures and narcolepsy. She returns today for follow-up. She is currently taking Lamictal 300 mg daily for seizures and Provigil 200 mg for daytime sleepiness. She reports that she continues to feel fatigued and sleepy throughout the day. Her Epworth is 20 and Fatigue Severity score is 60. She feels that the Provigil offers some relief but would like to try something else.She sleeps 6-7 hours a night. Goes to bed between 11:00 pm- 12:00 am and arises at 6:30 am. She does have trouble falling asleep and therefore some nights she will only get 3-4 hours of sleep. She does use Klonopin to help her fall asleep. She reports that Lamictal has controlled her seizures. Her last seizure was in April.   HISTORY 05/18/14 (CD): [name] is a 28 y.o. female Is seen here as a referral/ revisit from Dr. Lorenso Courier for [facility],  The patient had a history of one clinical seizure at age 77, he had exercise-induced asthma as a teenager and she had a back surgery at age 44 portable spine. In 2004 -February she had a second  seizure, witnessed , and as far as she recalls unprovoked.  Became rigid her eyes rolled back and she lost awareness of her surroundings. She also has reported myoclonic jerks she also has insomnia and excessive daytime sleepiness. Her EEG was normal. Then on 04/12/2013 the patient had a second seizure in adulthood, had not been on seizure medication since her pregnancy in 2012. She reports that she was home alone with her 34-year-old child and remembers looking down performed feeling disconnected then lost control of her surroundings she of books sitting on the sofa feeling very tired with pain of the head and neck.  She received 1 mg of Ativan intravenously and the local emergency room 1 no imaging was done.  The patient has reportedly still problems to get enough sleep and on questioning had many symptoms that would be obsessive-compulsive in origin please look at our last visit note for details.  I had initiated a medical as well as recommended strongly a psychology or psychiatric visit to evaluate for depression with OCD.  REVIEW OF SYSTEMS: Out of a complete 14 system review of symptoms, the patient complains only of the following symptoms, and all other reviewed systems are negative.  Chills, fatigue Coughing, shortness of breath Diarrhea, nausea, vomiting Insomnia, frequent waking, daytime sleepiness, snoring Back pain, muscle cramps Dizziness, headache, weakness, tremors Behavior problem, confusion, decreased concentration, depression, nervous/anxious  ALLERGIES: Allergies  Allergen Reactions  . Other Anaphylaxis    balsalmic vingerette  .  Soy Allergy Anaphylaxis  . Ciprofloxacin Other (See Comments)    Patient stated that her arm started burning really intensely.  . Nsaids Other (See Comments)    Told to avoid NSAIDS due to her kidney disorder.  . Topiramate Other (See Comments)    Reaction unknown  . Triptans     Happened maybe in '05 possibly, reaction unsure.   .  Adhesive [Tape] Rash    HOME MEDICATIONS: Outpatient Prescriptions Prior to Visit  Medication Sig Dispense Refill  . amphetamine-dextroamphetamine (ADDERALL XR) 20 MG 24 hr capsule Take 20 mg by mouth daily.    . clonazePAM (KLONOPIN) 1 MG tablet Take 1 tablet (1 mg total) by mouth at bedtime. TAKE 1 TABLET BY MOUTH AT BEDTIME 30 tablet 2  . FLUoxetine (PROZAC) 40 MG capsule Take 1 capsule (40 mg total) by mouth daily. 30 capsule 2  . gabapentin (NEURONTIN) 300 MG capsule Take 300 mg by mouth daily.    Marland Kitchen. LAMICTAL XR 300 MG TB24 Take 1 tablet (300 mg total) by mouth daily. 30 tablet 5  . oxyCODONE-acetaminophen (PERCOCET/ROXICET) 5-325 MG per tablet Take 1-2 tablets by mouth every 6 (six) hours as needed for severe pain. 20 tablet 0   No facility-administered medications prior to visit.    PAST MEDICAL HISTORY: Past Medical History  Diagnosis Date  . MIGRAINES, HX OF 06/24/2007  . Gallstones   . IBS (irritable bowel syndrome)     doesn't take any meds  . OCD (obsessive compulsive disorder)     takes Prozac daily  . Anxiety     takes Klonopin nightly  . ASTHMA NOS W/ACUTE EXACERBATION 01/05/2010    exercise induced  . History of migraine     last one a couple of days ago  . Coarse tremors   . Seizures     takes Lamictal daily;last seizure was in 11/14/10  . Degenerative disc disease     back  . Back pain   . GERD (gastroesophageal reflux disease)     but doesn't take anything for it  . Urinary frequency   . Medullary sponge kidney 2007    kidneys can be sick but she isnt sick  . PYELONEPHRITIS 06/24/2007  . History of UTI     frequent  . History of kidney stones   . Anemia     takes Ferrous Sulfate daily  . Narcolepsy   . 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 Back surgery    . Lumbar laminectomy/decompression microdiscectomy  07/24/2012    Procedure: LUMBAR LAMINECTOMY/DECOMPRESSION MICRODISCECTOMY;   Surgeon: Jacki Conesonald A Gioffre, MD;  Location: [facility];  Service: Orthopedics;  Laterality: Right;  L5-S1  . Cholecystectomy N/A 12/02/2013    Procedure: LAPAROSCOPIC CHOLECYSTECTOMY WITH ATTEMPTED INTRAOPERATIVE CHOLANGIOGRAM;  Surgeon: Atilano InaEric M Wilson, MD;  [facility];  Service: General;  Laterality: N/A;  . Wisdom tooth extraction      FAMILY HISTORY: Family History  Problem Relation Age of Onset  . Anesthesia problems Neg Hx   . Heart disease Mother   . ADD / ADHD Sister     SOCIAL HISTORY: History   Social History  . Marital Status: Married    Spouse Name: N/A    Number of Children: 2  . Years of Education: 14   Occupational History  . 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 Smoking status: Never  Smoker   . Smokeless tobacco: Never Used  . Alcohol Use: Yes     Comment: occas. when not pregnant  . Drug Use: No  . Sexual Activity:    Partners: Male     Comment: last sex  Aug 22 2014   Other Topics Concern  . Not on file   Social History Narrative   HSG, finished 2 years of college. single mom with 2 sons blake - Aug '09, Elijah Dec '12. 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 Still lives with father of her sons with wedding plans on hold ( Jan '13)      PHYSICAL EXAM  Filed Vitals:   08/25/14 0822  BP: 115/68  Pulse: 77  Temp: 97.6 F (36.4 C)  TempSrc: Oral  Height: 5' 1.5" (1.562 m)  Weight: 191 lb (86.637 kg)   Body mass index is 35.51 kg/(m^2).  Generalized: Well developed, in no acute distress   Neurological examination  Mentation: Alert oriented to time, place, history taking. Follows all commands speech and language fluent Cranial nerve II-XII: Pupils were equal round reactive to light. Extraocular movements were full, visual field were full on confrontational test. Facial sensation and strength were normal. Uvula tongue midline. Head turning and shoulder shrug  were normal and  symmetric. Motor: The motor testing reveals 5 over 5 strength of all 4 extremities. Good symmetric motor tone is noted throughout.  Sensory: Sensory testing is intact to soft touch on all 4 extremities. No evidence of extinction is noted.  Coordination: Cerebellar testing reveals good finger-nose-finger and heel-to-shin bilaterally.  Gait and station: Gait is normal.   Reflexes: Deep tendon reflexes are symmetric and normal bilaterally.    DIAGNOSTIC DATA (LABS, IMAGING, TESTING) - I reviewed patient records, labs, notes, testing and imaging myself where available.  Lab Results  Component Value Date   WBC 9.3 08/04/2014   HGB 12.6 08/04/2014   HCT 37.8 08/04/2014   MCV 89.2 08/04/2014   PLT 261 08/04/2014      Component Value Date/Time   NA 140 04/04/2014 1853   NA 141 10/30/2013 1615   K 3.7 04/04/2014 1853   CL 103 04/04/2014 1853   CO2 24 04/04/2014 1853   GLUCOSE 95 04/04/2014 1853   GLUCOSE 85 10/30/2013 1615   BUN 13 04/04/2014 1853   BUN 10 10/30/2013 1615   CREATININE 0.84 04/04/2014 1853   CREATININE 0.77 12/23/2013 0837   CALCIUM 9.3 04/04/2014 1853   PROT 6.9 12/23/2013 0837   PROT 7.2 10/30/2013 1615   ALBUMIN 3.8 12/23/2013 0837   AST 13 12/23/2013 0837   ALT 12 12/23/2013 0837   ALKPHOS 78 12/23/2013 0837   BILITOT 0.3 12/23/2013 0837   GFRNONAA >90 04/04/2014 1853   GFRAA >90 04/04/2014 1853   Lab Results  Component Value Date   VITAMINB12 275 10/30/2013   Lab Results  Component Value Date   TSH 1.53 12/07/2006      ASSESSMENT AND PLAN 28 y.o. year old female  has a past medical history of MIGRAINES, HX OF (06/24/2007); Gallstones; IBS (irritable bowel syndrome); OCD (obsessive compulsive disorder); Anxiety; ASTHMA NOS W/ACUTE EXACERBATION (01/05/2010); History of migraine; Coarse tremors; Seizures; Degenerative disc disease; Back pain; GERD (gastroesophageal reflux disease); Urinary frequency; Medullary sponge kidney (2007); PYELONEPHRITIS  (06/24/2007); History of UTI; History of kidney stones; Anemia; Narcolepsy; and Narcolepsy (05/18/2014). here with:  1. Narcolepsy  2. Seizures  Patient denies any recent seizure events. She  is tolerating Lamictal well. The patient is requesting to go back on Adderall for her narcolepsy. She states that coming off the Adderall has caused depression due to her extreme tiredness and unable to function during the day. I have advised patient that I would like to consult with Dr. Vickey Hugerohmeier and she is currently not in the office. I advised patient that I will call her once I speak to Dr. Vickey Hugerohmeier. The patient verbalized understanding.   I have consulted with Dr. Vickey Hugerohmeier and she recommends that the patient wait until after the first trimester before considering restarting these medications. Dr. Vickey Hugerohmeier also feels that coming off Adderall should not have caused extreme depression. She recommends that the patient follow up with her psychiatrist regarding her depression. I called the patient and explained Dr. Oliva Bustardohmeier's recommendation. The patient is not happy with this recommendation but understands. I advised the patient that if she begins to have suicidal  thoughts of harming her unborn child then she should go to the emergency room or let us or her psychiatrist know. The patient verbalized understanding. I will forward this note to Dr. Vickey Hugerohmeier for review.  Butch PennyMegan Marnee Sherrard, MSN, NP-C 08/25/2014, 8:28 AM [facility] [address], [address] [place], [place] [id number] [phone]  Note: This document was prepared with digital dictation and possible smart phrase technology. Any transcriptional errors that result from this process are unintentional.

## 2014-08-27 ENCOUNTER — Other Ambulatory Visit: Payer: Self-pay | Admitting: Obstetrics and Gynecology

## 2014-08-28 LAB — CYTOLOGY - PAP

## 2014-09-01 ENCOUNTER — Other Ambulatory Visit: Payer: Self-pay | Admitting: Nephrology

## 2014-09-01 ENCOUNTER — Telehealth (HOSPITAL_COMMUNITY): Payer: Self-pay | Admitting: Psychiatry

## 2014-09-01 DIAGNOSIS — R109 Unspecified abdominal pain: Secondary | ICD-10-CM

## 2014-09-01 NOTE — Telephone Encounter (Signed)
I returned phone call.  She is pregnant.  She is no longer taking Klonopin and Prozac.  Dr. Sharon SellerMcClung started her on Zoloft and she is taking Lamictal extended release.  She had tried to get medicine for narcolepsy however it was not given.  Reassurance given.  Her depression is not as bad.  She has no suicidal thoughts.  She scheduled to see in first week of January.  We will discuss more on her next visit.

## 2014-09-03 ENCOUNTER — Other Ambulatory Visit: Payer: Self-pay

## 2014-09-07 ENCOUNTER — Ambulatory Visit
Admission: RE | Admit: 2014-09-07 | Discharge: 2014-09-07 | Disposition: A | Discharge status: Home / Self Care | Payer: Medicaid Other | Source: Ambulatory Visit | Attending: Nephrology | Admitting: Nephrology

## 2014-09-07 DIAGNOSIS — R109 Unspecified abdominal pain: Secondary | ICD-10-CM

## 2014-09-22 ENCOUNTER — Inpatient Hospital Stay (HOSPITAL_COMMUNITY)
Admission: AD | Admit: 2014-09-22 | Discharge: 2014-09-22 | Disposition: A | Discharge status: Home / Self Care | Payer: Medicaid Other | Source: Ambulatory Visit | Attending: Obstetrics and Gynecology | Admitting: Obstetrics and Gynecology

## 2014-09-22 ENCOUNTER — Encounter (HOSPITAL_COMMUNITY): Payer: Self-pay | Admitting: *Deleted

## 2014-09-22 DIAGNOSIS — O99611 Diseases of the digestive system complicating pregnancy, first trimester: Secondary | ICD-10-CM | POA: Insufficient documentation

## 2014-09-22 DIAGNOSIS — O210 Mild hyperemesis gravidarum: Secondary | ICD-10-CM | POA: Diagnosis present

## 2014-09-22 DIAGNOSIS — O219 Vomiting of pregnancy, unspecified: Secondary | ICD-10-CM

## 2014-09-22 DIAGNOSIS — K219 Gastro-esophageal reflux disease without esophagitis: Secondary | ICD-10-CM | POA: Insufficient documentation

## 2014-09-22 DIAGNOSIS — R51 Headache: Secondary | ICD-10-CM | POA: Insufficient documentation

## 2014-09-22 DIAGNOSIS — Z3A13 13 weeks gestation of pregnancy: Secondary | ICD-10-CM | POA: Diagnosis not present

## 2014-09-22 LAB — URINALYSIS, ROUTINE W REFLEX MICROSCOPIC
Bilirubin Urine: NEGATIVE
Glucose, UA: NEGATIVE mg/dL
Ketones, ur: 15 mg/dL — AB
LEUKOCYTES UA: NEGATIVE
NITRITE: NEGATIVE
PH: 6 (ref 5.0–8.0)
Protein, ur: NEGATIVE mg/dL
Urobilinogen, UA: 0.2 mg/dL (ref 0.0–1.0)

## 2014-09-22 LAB — CBC
HCT: 35.9 % — ABNORMAL LOW (ref 36.0–46.0)
HEMOGLOBIN: 12.3 g/dL (ref 12.0–15.0)
MCH: 29.7 pg (ref 26.0–34.0)
MCHC: 34.3 g/dL (ref 30.0–36.0)
MCV: 86.7 fL (ref 78.0–100.0)
Platelets: 224 10*3/uL (ref 150–400)
RBC: 4.14 MIL/uL (ref 3.87–5.11)
RDW: 12.6 % (ref 11.5–15.5)
WBC: 8.3 10*3/uL (ref 4.0–10.5)

## 2014-09-22 LAB — COMPREHENSIVE METABOLIC PANEL
ALT: 13 U/L (ref 0–35)
AST: 12 U/L (ref 0–37)
Albumin: 3.4 g/dL — ABNORMAL LOW (ref 3.5–5.2)
Alkaline Phosphatase: 73 U/L (ref 39–117)
Anion gap: 7 (ref 5–15)
BUN: 6 mg/dL (ref 6–23)
CO2: 24 mmol/L (ref 19–32)
Calcium: 9.1 mg/dL (ref 8.4–10.5)
Chloride: 105 mEq/L (ref 96–112)
Creatinine, Ser: 0.44 mg/dL — ABNORMAL LOW (ref 0.50–1.10)
GFR calc Af Amer: >90 mL/min (ref >90)
GFR calc non Af Amer: >90 mL/min (ref >90)
Glucose, Bld: 90 mg/dL (ref 70–99)
POTASSIUM: 4.2 mmol/L (ref 3.5–5.1)
SODIUM: 136 mmol/L (ref 135–145)
TOTAL PROTEIN: 6.9 g/dL (ref 6.0–8.3)
Total Bilirubin: 0.4 mg/dL (ref 0.3–1.2)

## 2014-09-22 LAB — URINE MICROSCOPIC-ADD ON

## 2014-09-22 MED ORDER — PROMETHAZINE HCL 25 MG/ML IJ SOLN
25.0000 mg | Freq: Once | INTRAVENOUS | Status: AC
  Administered 2014-09-22: 25 mg via INTRAVENOUS
  Filled 2014-09-22: qty 1

## 2014-09-22 MED ORDER — HYDROMORPHONE HCL 1 MG/ML IJ SOLN
1.0000 mg | Freq: Once | INTRAMUSCULAR | Status: AC
  Administered 2014-09-22: 1 mg via INTRAVENOUS
  Filled 2014-09-22: qty 1

## 2014-09-22 MED ORDER — PROMETHAZINE HCL 12.5 MG PO TABS: 12.5000 mg | ORAL_TABLET | Freq: Four times a day (QID) | ORAL | Status: DC | PRN

## 2014-09-22 NOTE — MAU Note (Signed)
Pt. Has had a migraine, vomiting and diarrhea since last Tuesday.  Pt. Is also having chills that started a couple of days ago.  Pt. Denies any bleeding, but is having sharp cramps in her lower abdomen.

## 2014-09-22 NOTE — Discharge Instructions (Signed)
Morning Sickness °Morning sickness is when you feel sick to your stomach (nauseous) during pregnancy. You may feel sick to your stomach and throw up (vomit). You may feel sick in the morning, but you can feel this way any time of day. Some women feel very sick to their stomach and cannot stop throwing up (hyperemesis gravidarum). °HOME CARE °· Only take medicines as told by your doctor. °· Take multivitamins as told by your doctor. Taking multivitamins before getting pregnant can stop or lessen the harshness of morning sickness. °· Eat dry toast or unsalted crackers before getting out of bed. °· Eat 5 to 6 small meals a day. °· Eat dry and bland foods like rice and baked potatoes. °· Do not drink liquids with meals. Drink between meals. °· Do not eat greasy, fatty, or spicy foods. °· Have someone cook for you if the smell of food causes you to feel sick or throw up. °· If you feel sick to your stomach after taking prenatal vitamins, take them at night or with a snack. °· Eat protein when you need a snack (nuts, yogurt, cheese). °· Eat unsweetened gelatins for dessert. °· Wear a bracelet used for sea sickness (acupressure wristband). °· Go to a doctor that puts thin needles into certain body points (acupuncture) to improve how you feel. °· Do not smoke. °· Use a humidifier to keep the air in your house free of odors. °· Get lots of fresh air. °GET HELP IF: °· You need medicine to feel better. °· You feel dizzy or lightheaded. °· You are losing weight. °GET HELP RIGHT AWAY IF:  °· You feel very sick to your stomach and cannot stop throwing up. °· You pass out (faint). °MAKE SURE YOU: °· Understand these instructions. °· Will watch your condition. °· Will get help right away if you are not doing well or get worse. °Document Released: 10/19/2004 Document Revised: 09/16/2013 Document Reviewed: 02/26/2013 °ExitCare® Patient Information ©2015 [employer]. This information is not intended to replace advice given to you by  your health care provider. Make sure you discuss any questions you have with your health care provider. ° °Eating Plan for Hyperemesis Gravidarum °Severe cases of hyperemesis gravidarum can lead to dehydration and malnutrition. The hyperemesis eating plan is one way to lessen the symptoms of nausea and vomiting. It is often used with prescribed medicines to control your symptoms.  °WHAT CAN I DO TO RELIEVE MY SYMPTOMS? °Listen to your body. Everyone is different and has different preferences. Find what works best for you. Some of the following things may help: °· Eat and drink slowly. °· Eat 5-6 small meals daily instead of 3 large meals.   °· Eat crackers before you get out of bed in the morning.   °· Starchy foods are usually well tolerated (such as cereal, toast, bread, potatoes, pasta, rice, and pretzels).   °· Ginger may help with nausea. Add ¼ tsp ground ginger to hot tea or choose ginger tea.   °· Try drinking 100% fruit juice or an electrolyte drink. °· Continue to take your prenatal vitamins as directed by your health care provider. If you are having trouble taking your prenatal vitamins, talk with your health care provider about different options. °· Include at least 1 serving of protein with your meals and snacks (such as meats or poultry, beans, nuts, eggs, or yogurt). Try eating a protein-rich snack before bed (such as cheese and crackers or a half turkey or peanut butter sandwich). °WHAT THINGS SHOULD I   AVOID TO REDUCE MY SYMPTOMS? °The following things may help reduce your symptoms: °· Avoid foods with strong smells. Try eating meals in well-ventilated areas that are free of odors. °· Avoid drinking water or other beverages with meals. Try not to drink anything less than 30 minutes before and after meals. °· Avoid drinking more than 1 cup of fluid at a time. °· Avoid fried or high-fat foods, such as butter and cream sauces. °· Avoid spicy foods. °· Avoid skipping meals the best you can. Nausea can be  more intense on an empty stomach. If you cannot tolerate food at that time, do not force it. Try sucking on ice chips or other frozen items and make up the calories later. °· Avoid lying down within 2 hours after eating. °Document Released: 07/09/2007 Document Revised: 09/16/2013 Document Reviewed: 07/16/2013 °ExitCare® Patient Information ©2015 [employer]. This information is not intended to replace advice given to you by your health care provider. Make sure you discuss any questions you have with your health care provider. ° °

## 2014-09-22 NOTE — MAU Provider Note (Signed)
History     CSN: [id number]  Arrival date and time: 09/22/14 4098   First Provider Initiated Contact with Patient 09/22/14 1834      No chief complaint on file.  HPI  [name] is a 28 yo G3P2002 at [redacted]w[redacted]d wks IUP here with migraine, vomiting and diarrhea since last Tuesday. Reports vomiting x 2 today.  No report of eating today.  Tried crackers and ginger ale, however would have a loose stool.  Reports having 2 loose stools per hour since 7:00 am.  Pt. Is also having chills that started a couple of days ago.  Denies any vaginal bleeding, but is having sharp cramps in her lower abdomen.    Past Medical History  Diagnosis Date  . MIGRAINES, HX OF 06/24/2007  . Gallstones   . IBS (irritable bowel syndrome)     doesn't take any meds  . OCD (obsessive compulsive disorder)     takes Prozac daily  . Anxiety     takes Klonopin nightly  . ASTHMA NOS W/ACUTE EXACERBATION 01/05/2010    exercise induced  . History of migraine     last one a couple of days ago  . Coarse tremors   . Seizures     takes Lamictal daily;last seizure was in 11/14/10  . Degenerative disc disease     back  . Back pain   . GERD (gastroesophageal reflux disease)     but doesn't take anything for it  . Urinary frequency   . Medullary sponge kidney 2007    kidneys can be sick but she isnt sick  . PYELONEPHRITIS 06/24/2007  . History of UTI     frequent  . History of kidney stones   . Anemia     takes Ferrous Sulfate daily  . Narcolepsy   . 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 Back surgery    . Lumbar laminectomy/decompression microdiscectomy  07/24/2012    Procedure: LUMBAR LAMINECTOMY/DECOMPRESSION MICRODISCECTOMY;  Surgeon: Jacki Cones, MD;  Location: [facility] ORS;  Service: Orthopedics;  Laterality: Right;  L5-S1  . Cholecystectomy N/A 12/02/2013    Procedure: LAPAROSCOPIC CHOLECYSTECTOMY WITH ATTEMPTED INTRAOPERATIVE CHOLANGIOGRAM;  Surgeon: Atilano Ina, MD;   Location: [facility];  Service: General;  Laterality: N/A;  . Wisdom tooth extraction      Family History  Problem Relation Age of Onset  . Anesthesia problems Neg Hx   . Heart disease Mother   . ADD / ADHD Sister     History  Substance Use Topics  . Smoking status: Never Smoker   . Smokeless tobacco: Never Used  . Alcohol Use: No     Comment: occas. when not pregnant    Allergies:  Allergies  Allergen Reactions  . Other Anaphylaxis    balsalmic vingerette  . Soy Allergy Anaphylaxis  . Ciprofloxacin Other (See Comments)    Patient stated that her arm started burning really intensely.  . Nsaids Other (See Comments)    Told to avoid NSAIDS due to her kidney disorder.  . Topiramate Other (See Comments)    Reaction unknown, childhood allergy.  . Triptans     Happened maybe in '05 possibly, reaction unsure.   . Adhesive [Tape] Itching and Rash    Prescriptions prior to admission  Medication Sig Dispense Refill Last Dose  . acetaminophen (TYLENOL) 500 MG tablet Take 1,000 mg by mouth every 6 (six) hours as needed for moderate pain (Used for migraine.).  Past Week at Unknown time  . LAMICTAL XR 300 MG TB24 Take 1 tablet (300 mg total) by mouth daily. 30 tablet 5 09/22/2014 at 0800  . amphetamine-dextroamphetamine (ADDERALL XR) 20 MG 24 hr capsule Take 20 mg by mouth daily.   Not Taking at Unknown time  . clonazePAM (KLONOPIN) 1 MG tablet Take 1 tablet (1 mg total) by mouth at bedtime. TAKE 1 TABLET BY MOUTH AT BEDTIME (Patient not taking: Reported on 08/25/2014) 30 tablet 2 Not Taking at Unknown time  . FLUoxetine (PROZAC) 40 MG capsule Take 1 capsule (40 mg total) by mouth daily. (Patient not taking: Reported on 08/25/2014) 30 capsule 2 Not Taking at Unknown time  . gabapentin (NEURONTIN) 300 MG capsule Take 300 mg by mouth daily.   Not Taking at Unknown time  . oxyCODONE-acetaminophen (PERCOCET/ROXICET) 5-325 MG per tablet Take 1-2 tablets by mouth every 6 (six) hours as needed for  severe pain. (Patient not taking: Reported on 08/25/2014) 20 tablet 0 Not Taking at Unknown time    Review of Systems  Constitutional: Positive for chills. Negative for fever.  Gastrointestinal: Positive for nausea, vomiting, abdominal pain (cramping) and diarrhea. Negative for constipation and blood in stool.  Genitourinary: Negative for dysuria and urgency.  Neurological: Positive for headaches.  All other systems reviewed and are negative.  Physical Exam   Blood pressure 119/70, pulse 82, temperature 98.1 F (36.7 C), temperature source Oral, resp. rate 18, last menstrual period 06/19/2014.  Physical Exam  Constitutional: She is oriented to person, place, and time. She appears well-developed and well-nourished. No distress.  HENT:  Head: Normocephalic.  Mouth/Throat: Mucous membranes are dry.  Neck: Normal range of motion. Neck supple.  Cardiovascular: Normal rate, regular rhythm and normal heart sounds.   Respiratory: Effort normal and breath sounds normal.  GI: Soft. There is tenderness (midpelvis).  Genitourinary: No bleeding in the vagina. No vaginal discharge found.  Neurological: She is alert and oriented to person, place, and time. She has normal reflexes.  Skin: Skin is warm and dry. She is not diaphoretic.   FHR 154 via doppler  Results for orders placed or performed during the hospital encounter of 09/22/14 (from the past 24 hour(s))  Urinalysis, Routine w reflex microscopic     Status: Abnormal   Collection Time: 09/22/14  6:07 PM  Result Value Ref Range   Color, Urine YELLOW YELLOW   APPearance HAZY (A) CLEAR   Specific Gravity, Urine >1.030 (H) 1.005 - 1.030   pH 6.0 5.0 - 8.0   Glucose, UA NEGATIVE NEGATIVE mg/dL   Hgb urine dipstick TRACE (A) NEGATIVE   Bilirubin Urine NEGATIVE NEGATIVE   Ketones, ur 15 (A) NEGATIVE mg/dL   Protein, ur NEGATIVE NEGATIVE mg/dL   Urobilinogen, UA 0.2 0.0 - 1.0 mg/dL   Nitrite NEGATIVE NEGATIVE   Leukocytes, UA NEGATIVE  NEGATIVE  Urine microscopic-add on     Status: Abnormal   Collection Time: 09/22/14  6:07 PM  Result Value Ref Range   Squamous Epithelial / LPF MANY (A) RARE   WBC, UA 3-6 <3 WBC/hpf   RBC / HPF 0-2 <3 RBC/hpf   Bacteria, UA MANY (A) RARE  CBC     Status: Abnormal   Collection Time: 09/22/14  6:55 PM  Result Value Ref Range   WBC 8.3 4.0 - 10.5 K/uL   RBC 4.14 3.87 - 5.11 MIL/uL   Hemoglobin 12.3 12.0 - 15.0 g/dL   HCT 16.135.9 (L) 09.636.0 - 04.546.0 %  MCV 86.7 78.0 - 100.0 fL   MCH 29.7 26.0 - 34.0 pg   MCHC 34.3 30.0 - 36.0 g/dL   RDW 16.112.6 09.611.5 - 04.515.5 %   Platelets 224 150 - 400 K/uL  Comprehensive metabolic panel     Status: Abnormal   Collection Time: 09/22/14  6:55 PM  Result Value Ref Range   Sodium 136 135 - 145 mmol/L   Potassium 4.2 3.5 - 5.1 mmol/L   Chloride 105 96 - 112 mEq/L   CO2 24 19 - 32 mmol/L   Glucose, Bld 90 70 - 99 mg/dL   BUN 6 6 - 23 mg/dL   Creatinine, Ser 4.090.44 (L) 0.50 - 1.10 mg/dL   Calcium 9.1 8.4 - 81.110.5 mg/dL   Total Protein 6.9 6.0 - 8.3 g/dL   Albumin 3.4 (L) 3.5 - 5.2 g/dL   AST 12 0 - 37 U/L   ALT 13 0 - 35 U/L   Alkaline Phosphatase 73 39 - 117 U/L   Total Bilirubin 0.4 0.3 - 1.2 mg/dL   GFR calc non Af Amer >90 >90 mL/min   GFR calc Af Amer >90 >90 mL/min   Anion gap 7 5 - 15    MAU Course  Procedures  IV LR with Phenergan 25 mg and dilaudid 1 mg IV 1956 Consulted with Dr. Arelia SneddonMcComb > reviewed HPI/exam/urine results and plan of care > agrees with plan  2000 Report given to J. Wenzel who assumes care of patient.  Eino FarberWalidah Kennith GainN Karim, CNM  2000 - Care assumed from OrtleyWalidah Karim, [place] Patient received IV fluids and medications. She has tolerated PO while in MAU and states mild continued nausea, without any episodes of vomiting or diarrhea. Headache has improved significantly.   Assessment and Plan  A: SIUP at 5330w4d Nausea and vomiting in pregnancy prior to [redacted] weeks gestation Headache  P: Discharge home Rx for Phenergan given to  patient First trimester warning signs discussed Patient given information on AVS regarding morning sickness and hyperemesis diet Patient advised to follow-up with [facility] as scheduled or sooner if symptoms persist or worsen Patient may return to MAU as needed or if her condition were to change or worsen   Marny LowensteinJulie N Wenzel, PA-C  09/22/2014 9:13 PM

## 2014-09-30 ENCOUNTER — Ambulatory Visit (HOSPITAL_COMMUNITY): Payer: Self-pay | Admitting: Psychiatry

## 2014-10-19 ENCOUNTER — Telehealth: Payer: Self-pay

## 2014-10-19 MED ORDER — LAMOTRIGINE ER 300 MG PO TB24: 300.0000 mg | ORAL_TABLET | Freq: Every day | ORAL | Status: DC

## 2014-10-19 NOTE — Telephone Encounter (Signed)
[facility] from [facility]  Pharmacist [phone]   On [address] [place]  Called patient is requesting the Generic of Lamictal 300 mg. Per [facility] we will need a new rx. Patient is also out of refills.

## 2014-10-19 NOTE — Telephone Encounter (Signed)
Ok to change but please make the patient aware that changing from brand name to generic can cause her to have a seizure.

## 2014-10-19 NOTE — Telephone Encounter (Signed)
Rx has been sent.  I called the patient.  Got no answer.  Left message.

## 2014-10-19 NOTE — Telephone Encounter (Signed)
Patient is requesting that we change her Lamictal Rx from Brand Name Only to Generic Substitution Permissible.  Okay to change?  Please advise.  Thank you.

## 2014-10-21 ENCOUNTER — Telehealth: Payer: Self-pay | Admitting: Adult Health

## 2014-10-21 NOTE — Telephone Encounter (Signed)
Patient called office requesting to be started on Adderall again.  I tried to call back to see if she had consulted psych on both home and cell, but got no answer on either line.  Left message.

## 2014-10-21 NOTE — Telephone Encounter (Signed)
I called the patient. She states that she did have a miscarriage and stated that "she didn't want to talk about it." I explained that I had spoken with Dr. Vickey Hugerohmeier and she would rather the patient be evaluated by her psychiatrist  for depression and now grief related to her miscarriage before we restart adderall. Patient verbalized understanding.

## 2014-10-21 NOTE — Telephone Encounter (Signed)
Patient is requesting that we restart her on Adderall.  Says she has not consulted psych, but unfortunately, has lost the baby she was carrying.  Please advise.  Thank you.

## 2014-10-21 NOTE — Telephone Encounter (Signed)
Pt is calling back stating that she has not consulted a psych.  She is trying to get back on all of her medications prior to her being pregnant.  She lost her baby. When calling back please call her work number that is listed.  Please call and advise.

## 2014-10-21 NOTE — Telephone Encounter (Signed)
Pt is calling and requesting to be put back on Adderall.  Please call and advise.

## 2014-11-30 ENCOUNTER — Telehealth: Payer: Self-pay | Admitting: Neurology

## 2014-11-30 ENCOUNTER — Encounter (HOSPITAL_COMMUNITY): Payer: Self-pay | Admitting: Psychiatry

## 2014-11-30 ENCOUNTER — Ambulatory Visit (INDEPENDENT_AMBULATORY_CARE_PROVIDER_SITE_OTHER): Payer: Medicaid Other | Admitting: Psychiatry

## 2014-11-30 VITALS — BP 123/84 | HR 85 | Ht 61.5 in | Wt 183.8 lb

## 2014-11-30 DIAGNOSIS — F909 Attention-deficit hyperactivity disorder, unspecified type: Secondary | ICD-10-CM

## 2014-11-30 DIAGNOSIS — F429 Obsessive-compulsive disorder, unspecified: Secondary | ICD-10-CM

## 2014-11-30 DIAGNOSIS — F42 Obsessive-compulsive disorder: Secondary | ICD-10-CM

## 2014-11-30 DIAGNOSIS — F419 Anxiety disorder, unspecified: Secondary | ICD-10-CM

## 2014-11-30 MED ORDER — FLUOXETINE HCL 40 MG PO CAPS: 40.0000 mg | ORAL_CAPSULE | Freq: Every day | ORAL | Status: DC

## 2014-11-30 NOTE — Telephone Encounter (Signed)
According to Dr. [name] note- patient's symptoms of depression have not gotten worse. She restarted prozac. He gave no indication that he would not recommend her restart adderall. [name], you can refill.

## 2014-11-30 NOTE — Telephone Encounter (Signed)
Patient called to state that her psychiatrist has cleared her to go back on Adderall. She would like a prescription of this ASAP. Her best contact # is [phone] and it is okay to leave a message.
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
## 2014-12-01 ENCOUNTER — Telehealth: Payer: Self-pay

## 2014-12-01 ENCOUNTER — Other Ambulatory Visit: Payer: Self-pay | Admitting: *Deleted

## 2014-12-01 MED ORDER — AMPHETAMINE-DEXTROAMPHET ER 20 MG PO CP24: 20.0000 mg | ORAL_CAPSULE | Freq: Every day | ORAL | Status: DC

## 2014-12-01 NOTE — Telephone Encounter (Signed)
Called patient to pick up RX spoke with patient (Adderall)

## 2014-12-28 ENCOUNTER — Telehealth: Payer: Self-pay | Admitting: Adult Health

## 2014-12-28 MED ORDER — AMPHETAMINE-DEXTROAMPHET ER 30 MG PO CP24: 30.0000 mg | ORAL_CAPSULE | Freq: Every day | ORAL | Status: DC

## 2014-12-28 NOTE — Telephone Encounter (Signed)
Patient stated she's extremely exhausted after taking Rx amphetamine-dextroamphetamine (ADDERALL XR) 20 MG 24 hr capsule.  She's cut back on Rx clonazePAM (KLONOPIN) 1 MG tablet due to feeling tired and exhausted during the day.  Questioning if there's an alternative medication.  Please call and advise.

## 2014-12-28 NOTE — Telephone Encounter (Signed)
I called the patient. She feels that the Adderall XL 20 mg is not offering her much relief for her sleepiness during the day. She states that she is tired all day long. Her psychiatrist recommended that she cut back on the Klonopin. She has completely stopped taking Klonopin now. She is still sleepy during the day. I will increase her Adderall to 30 mg if this is not beneficial we may have to consider other medication.

## 2014-12-29 ENCOUNTER — Telehealth: Payer: Self-pay

## 2014-12-29 NOTE — Telephone Encounter (Signed)
Called patient and informed Rx ready for pick up at front desk. Patient verbalized understanding.  

## 2014-12-30 ENCOUNTER — Other Ambulatory Visit (HOSPITAL_COMMUNITY): Payer: Self-pay | Admitting: Psychiatry

## 2014-12-30 NOTE — Telephone Encounter (Signed)
Patient last seen 11-30-14. Medication was sent 11-30-14, 30 day supply no refill.  Patient cancelled appointment on 01-04-15. Patient rescheduled appointment on 01-11-15. Do you want to refill?

## 2014-12-30 NOTE — Telephone Encounter (Signed)
Patient is pregnant.

## 2014-12-31 NOTE — Telephone Encounter (Signed)
lmom for patient to call back.  Need to verify pregnancy.  Per Dr. Lolly MustacheArfeen if pregnant, no refill.  If not pregnant then okay.

## 2015-01-01 NOTE — Telephone Encounter (Signed)
LMOM at home and cell for patient to call back

## 2015-01-01 NOTE — Telephone Encounter (Signed)
Patient called back.  Patient stated she is not pregnant at this time.  I advised patient I will send refill of Prozac in today.

## 2015-01-04 ENCOUNTER — Ambulatory Visit (HOSPITAL_COMMUNITY): Payer: Self-pay | Admitting: Psychiatry

## 2015-01-11 ENCOUNTER — Ambulatory Visit (HOSPITAL_COMMUNITY): Payer: Self-pay | Admitting: Psychiatry

## 2015-01-11 ENCOUNTER — Encounter: Payer: Self-pay | Admitting: Neurology

## 2015-01-17 ENCOUNTER — Other Ambulatory Visit (HOSPITAL_COMMUNITY): Payer: Self-pay | Admitting: Psychiatry

## 2015-01-19 NOTE — Telephone Encounter (Signed)
Telephone call from patient requesting a refill of her Klonopin.  Informed patient she would need to schedule an appointment as canceled 01/04/15 but then she was canceled when Dr. Lolly MustacheArfeen was going to be out on 01/11/15.  Patient reported she had taken off work to attend appointment that date and now would have to reschedule and arrange another day off to attend.  Patient agreed to schedule needed appointment but requests Dr. Lolly MustacheArfeen approve a refill of her requested Klonopin.

## 2015-01-31 ENCOUNTER — Other Ambulatory Visit (HOSPITAL_COMMUNITY): Payer: Self-pay | Admitting: Psychiatry

## 2015-02-01 ENCOUNTER — Telehealth: Payer: Self-pay | Admitting: Neurology

## 2015-02-01 NOTE — Telephone Encounter (Signed)
Request entered, forwarded to provider for approval.  

## 2015-02-01 NOTE — Telephone Encounter (Signed)
Patient is calling for written Rx Adderall XR 30 mg.  Please call when ready.  Thanks!

## 2015-02-01 NOTE — Telephone Encounter (Signed)
Denied medication refill.  Patient was told by [name], RN that she needs to make an appointment for future refills.  Tried to contact patient had to leave a message for a call back.  Notified pharmacy to have patient call the office. Note below:   Expand All Collapse All   Telephone call from patient requesting a refill of her Klonopin. Informed patient she would need to schedule an appointment as canceled 01/04/15 but then she was canceled when Dr. Lolly MustacheArfeen was going to be out on 01/11/15. Patient reported she had taken off work to attend appointment that date and now would have to reschedule and arrange another day off to attend. Patient agreed to schedule needed appointment but requests Dr. Lolly MustacheArfeen approve a refill of her requested Klonopin.

## 2015-02-04 MED ORDER — AMPHETAMINE-DEXTROAMPHET ER 30 MG PO CP24
30.0000 mg | ORAL_CAPSULE | Freq: Every day | ORAL | Status: DC
Start: 2015-02-04 — End: 2015-03-15

## 2015-02-05 ENCOUNTER — Telehealth: Payer: Self-pay

## 2015-02-05 NOTE — Telephone Encounter (Signed)
Called pt to inform her that her adderall is ready for pick up at the front desk. Gave her clinic hours. Pt verbalized understanding.

## 2015-02-05 NOTE — Telephone Encounter (Signed)
Patient called wanting to check and see if her script was ready for pick up. Please call and advise. Patient can be reached @ [phone]

## 2015-02-08 ENCOUNTER — Other Ambulatory Visit (HOSPITAL_COMMUNITY): Payer: Self-pay | Admitting: Psychiatry

## 2015-02-08 NOTE — Telephone Encounter (Signed)
Denied medication refill.  Second refill request. Patient was told by [name], RN that she needs to make an appointment for future refills.  Tried to contact patient had to leave a message for a call back.  Note below:   Expand All Collapse All  Telephone call from patient requesting a refill of her Klonopin. Informed patient she would need to schedule an appointment as canceled 01/04/15 but then she was canceled when Dr. Lolly MustacheArfeen was going to be out on 01/11/15. Patient reported she had taken off work to attend appointment that date and now would have to reschedule and arrange another day off to attend. Patient agreed to schedule needed appointment but requests Dr. Lolly MustacheArfeen approve a refill of her requested Klonopin.

## 2015-02-25 ENCOUNTER — Telehealth: Payer: Self-pay | Admitting: Neurology

## 2015-02-25 NOTE — Telephone Encounter (Signed)
Patient is calling.  The patient states she has lost weight from 200 lbs to 175 lbs and has been having back spasms. Could this be coming from the weight loss?  Please call and advise.

## 2015-02-25 NOTE — Telephone Encounter (Signed)
Returned pt's phone call. No answer, left message asking her to call me back at her convenience.

## 2015-02-27 ENCOUNTER — Other Ambulatory Visit (HOSPITAL_COMMUNITY): Payer: Self-pay | Admitting: Psychiatry

## 2015-03-02 ENCOUNTER — Other Ambulatory Visit (HOSPITAL_COMMUNITY): Payer: Self-pay | Admitting: Psychiatry

## 2015-03-02 NOTE — Telephone Encounter (Signed)
Attempted to call pt again. No answer. Left message on home number to call me back.

## 2015-03-15 ENCOUNTER — Telehealth: Payer: Self-pay

## 2015-03-15 ENCOUNTER — Other Ambulatory Visit: Payer: Self-pay | Admitting: Neurology

## 2015-03-15 MED ORDER — AMPHETAMINE-DEXTROAMPHET ER 30 MG PO CP24: 30.0000 mg | ORAL_CAPSULE | Freq: Every day | ORAL | Status: DC

## 2015-03-15 NOTE — Telephone Encounter (Signed)
Request entered, forwarded to provider for approval.  

## 2015-03-15 NOTE — Telephone Encounter (Signed)
Pt's RX is ready for pick up at the front desk. Called to inform her, no answer, left message asking her to call me back.

## 2015-03-15 NOTE — Telephone Encounter (Signed)
Patient called and requested a refill on Rx. amphetamine-dextroamphetamine (ADDERALL XR) 30 MG 24 hr capsule. Informed her it would be ready within 24 hours unless notified otherwise.

## 2015-04-19 ENCOUNTER — Telehealth: Payer: Self-pay

## 2015-04-19 ENCOUNTER — Other Ambulatory Visit: Payer: Self-pay | Admitting: Neurology

## 2015-04-19 MED ORDER — AMPHETAMINE-DEXTROAMPHET ER 30 MG PO CP24: 30.0000 mg | ORAL_CAPSULE | Freq: Every day | ORAL | Status: DC

## 2015-04-19 NOTE — Telephone Encounter (Signed)
Request entered, forwarded to provider for approval.  

## 2015-04-19 NOTE — Telephone Encounter (Signed)
Patient called and requested a refill on Rx. amphetamine-dextroamphetamine (ADDERALL XR) 30 MG 24 hr capsule.

## 2015-04-19 NOTE — Telephone Encounter (Signed)
Called pt to inform her that her adderall is ready for pick up at the front desk.  No answer, asked pt to call me back.

## 2015-05-18 ENCOUNTER — Telehealth: Payer: Self-pay | Admitting: Neurology

## 2015-05-18 NOTE — Telephone Encounter (Signed)
Patient called to request refill on amphetamine-dextroamphetamine (ADDERALL XR) 30 MG 24 hr capsule

## 2015-05-21 ENCOUNTER — Other Ambulatory Visit: Payer: Self-pay

## 2015-05-21 MED ORDER — AMPHETAMINE-DEXTROAMPHET ER 30 MG PO CP24: 30.0000 mg | ORAL_CAPSULE | Freq: Every day | ORAL | Status: DC

## 2015-05-21 NOTE — Telephone Encounter (Signed)
WID has approved request

## 2015-05-21 NOTE — Telephone Encounter (Signed)
It appears the original message was not forwarded.  Received request today.  Dr Dohmeier/Megan are both out of the office.  Request has been sent to [facility] for approval.

## 2015-05-24 ENCOUNTER — Encounter: Payer: Self-pay | Admitting: *Deleted

## 2015-05-24 NOTE — Progress Notes (Signed)
Adderall rx. up front [facility] 

## 2015-06-05 ENCOUNTER — Emergency Department (HOSPITAL_COMMUNITY)
Admission: EM | Admit: 2015-06-05 | Discharge: 2015-06-05 | Disposition: A | Discharge status: Home / Self Care | Payer: Medicaid Other | Attending: Emergency Medicine | Admitting: Emergency Medicine

## 2015-06-05 ENCOUNTER — Emergency Department (HOSPITAL_COMMUNITY): Payer: Medicaid Other

## 2015-06-05 ENCOUNTER — Encounter (HOSPITAL_COMMUNITY): Payer: Self-pay | Admitting: Emergency Medicine

## 2015-06-05 DIAGNOSIS — Z79899 Other long term (current) drug therapy: Secondary | ICD-10-CM | POA: Insufficient documentation

## 2015-06-05 DIAGNOSIS — G43909 Migraine, unspecified, not intractable, without status migrainosus: Secondary | ICD-10-CM | POA: Insufficient documentation

## 2015-06-05 DIAGNOSIS — M545 Low back pain: Secondary | ICD-10-CM | POA: Insufficient documentation

## 2015-06-05 DIAGNOSIS — G40909 Epilepsy, unspecified, not intractable, without status epilepticus: Secondary | ICD-10-CM | POA: Insufficient documentation

## 2015-06-05 DIAGNOSIS — Z862 Personal history of diseases of the blood and blood-forming organs and certain disorders involving the immune mechanism: Secondary | ICD-10-CM | POA: Insufficient documentation

## 2015-06-05 DIAGNOSIS — Z87448 Personal history of other diseases of urinary system: Secondary | ICD-10-CM | POA: Insufficient documentation

## 2015-06-05 DIAGNOSIS — J45901 Unspecified asthma with (acute) exacerbation: Secondary | ICD-10-CM | POA: Insufficient documentation

## 2015-06-05 DIAGNOSIS — G8929 Other chronic pain: Secondary | ICD-10-CM

## 2015-06-05 DIAGNOSIS — F419 Anxiety disorder, unspecified: Secondary | ICD-10-CM | POA: Insufficient documentation

## 2015-06-05 DIAGNOSIS — Z8719 Personal history of other diseases of the digestive system: Secondary | ICD-10-CM | POA: Insufficient documentation

## 2015-06-05 DIAGNOSIS — Q615 Medullary cystic kidney: Secondary | ICD-10-CM | POA: Insufficient documentation

## 2015-06-05 DIAGNOSIS — Z87442 Personal history of urinary calculi: Secondary | ICD-10-CM | POA: Insufficient documentation

## 2015-06-05 DIAGNOSIS — Z87440 Personal history of urinary (tract) infections: Secondary | ICD-10-CM | POA: Insufficient documentation

## 2015-06-05 DIAGNOSIS — M549 Dorsalgia, unspecified: Secondary | ICD-10-CM

## 2015-06-05 LAB — CBC WITH DIFFERENTIAL/PLATELET
Basophils Absolute: 0 K/uL (ref 0.0–0.1)
Basophils Relative: 0 % (ref 0–1)
Eosinophils Absolute: 0 K/uL (ref 0.0–0.7)
Eosinophils Relative: 0 % (ref 0–5)
HCT: 39.9 % (ref 36.0–46.0)
Hemoglobin: 13.3 g/dL (ref 12.0–15.0)
Lymphocytes Relative: 6 % — ABNORMAL LOW (ref 12–46)
Lymphs Abs: 0.8 K/uL (ref 0.7–4.0)
MCH: 30.2 pg (ref 26.0–34.0)
MCHC: 33.3 g/dL (ref 30.0–36.0)
MCV: 90.7 fL (ref 78.0–100.0)
Monocytes Absolute: 0.6 K/uL (ref 0.1–1.0)
Monocytes Relative: 5 % (ref 3–12)
Neutro Abs: 10.8 K/uL — ABNORMAL HIGH (ref 1.7–7.7)
Neutrophils Relative %: 89 % — ABNORMAL HIGH (ref 43–77)
Platelets: 234 K/uL (ref 150–400)
RBC: 4.4 MIL/uL (ref 3.87–5.11)
RDW: 12.8 % (ref 11.5–15.5)
WBC: 12.2 K/uL — ABNORMAL HIGH (ref 4.0–10.5)

## 2015-06-05 LAB — BASIC METABOLIC PANEL WITH GFR
Anion gap: 7 (ref 5–15)
BUN: 11 mg/dL (ref 6–20)
CO2: 23 mmol/L (ref 22–32)
Calcium: 9.1 mg/dL (ref 8.9–10.3)
Chloride: 107 mmol/L (ref 101–111)
Creatinine, Ser: 0.89 mg/dL (ref 0.44–1.00)
GFR calc Af Amer: >60 mL/min
GFR calc non Af Amer: >60 mL/min
Glucose, Bld: 122 mg/dL — ABNORMAL HIGH (ref 65–99)
Potassium: 3.5 mmol/L (ref 3.5–5.1)
Sodium: 137 mmol/L (ref 135–145)

## 2015-06-05 LAB — I-STAT TROPONIN, ED: Troponin i, poc: 0 ng/mL (ref 0.00–0.08)

## 2015-06-05 LAB — I-STAT BETA HCG BLOOD, ED (MC, WL, AP ONLY): I-stat hCG, quantitative: <5 m[IU]/mL (ref <5)

## 2015-06-05 MED ORDER — OXYCODONE-ACETAMINOPHEN 5-325 MG PO TABS: 2.0000 | ORAL_TABLET | ORAL | Status: DC | PRN

## 2015-06-05 MED ORDER — ONDANSETRON 8 MG PO TBDP
8.0000 mg | ORAL_TABLET | Freq: Once | ORAL | Status: AC
  Administered 2015-06-05: 8 mg via ORAL
  Filled 2015-06-05: qty 1

## 2015-06-05 MED ORDER — HYDROCODONE-ACETAMINOPHEN 5-325 MG PO TABS
1.0000 | ORAL_TABLET | Freq: Once | ORAL | Status: AC
  Administered 2015-06-05: 1 via ORAL
  Filled 2015-06-05: qty 1

## 2015-06-05 MED ORDER — ONDANSETRON HCL 4 MG PO TABS: 4.0000 mg | ORAL_TABLET | Freq: Four times a day (QID) | ORAL | Status: DC

## 2015-06-05 NOTE — ED Notes (Addendum)
Pt c/o increasing L side back pain and numbness radiating down L arm and leg x 1 month and nausea x 1 month and central chest tightness starting this morning.  Pt is followed by neurology w/ unknown diagnosis.   During Triage, Pt continually stated that she was going to "pass out" and would slump over in the chair.  Vitals remained stable and Pt was easily arousable.

## 2015-06-05 NOTE — ED Provider Notes (Signed)
CSN: [id number]     Arrival date & time 06/05/15  1209 History   First MD Initiated Contact with Patient 06/05/15 1231     Chief Complaint  Patient presents with  . Back Pain  . Nausea     (Consider location/radiation/quality/duration/timing/severity/associated sxs/prior Treatment) HPI Comments: [name] is a 29 y.o F with a past medical history of spinal stenosis status post laminectomy and microdiscectomy who presents to the ED today c/o back pain x 6months with pain now radiating down L arm and L leg. Pt states that she had back surgery in 2013 and never had follow up because she did not like her care and did not want to see her surgeon again. Pt has had back pain eer since but it has increased over the last 6 months. Pt states that the pain is now in her left arm and leg which started 9month ago. Today pt now has numbness in L arm and L leg. Pt also c/o chest tightness that started this morning. Does not radiate. Pt states that on the way over here her sister told her that she was "passing out a few times". No syncope once in ED. Pt states that she has never had a pain contract and has never seen pain management. PCP gives pt tylenol for pain management. Denies new trauma or injury, bowel or bladder incontinence, saddle anesthesia, SOB, HA, fever, chills, dysuria, cough,   Patient is a 29 y.o. female presenting with back pain. The history is provided by the patient.  Back Pain   Past Medical History  Diagnosis Date  . MIGRAINES, HX OF 06/24/2007  . Gallstones   . IBS (irritable bowel syndrome)     doesn't take any meds  . OCD (obsessive compulsive disorder)     takes Prozac daily  . Anxiety     takes Klonopin nightly  . ASTHMA NOS W/ACUTE EXACERBATION 01/05/2010    exercise induced  . History of migraine     last one a couple of days ago  . Coarse tremors   . Seizures     takes Lamictal daily;last seizure was in 11/14/10  . Degenerative disc disease     back  . Back pain   .  GERD (gastroesophageal reflux disease)     but doesn't take anything for it  . Urinary frequency   . Medullary sponge kidney 2007    kidneys can be sick but she isnt sick  . PYELONEPHRITIS 06/24/2007  . History of UTI     frequent  . History of kidney stones   . Anemia     takes Ferrous Sulfate daily  . Narcolepsy   . 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 Back surgery    . Lumbar laminectomy/decompression microdiscectomy  07/24/2012    Procedure: LUMBAR LAMINECTOMY/DECOMPRESSION MICRODISCECTOMY;  Surgeon: Jacki Cones, MD;  Location: [facility];  Service: Orthopedics;  Laterality: Right;  L5-S1  . Cholecystectomy N/A 12/02/2013    Procedure: LAPAROSCOPIC CHOLECYSTECTOMY WITH ATTEMPTED INTRAOPERATIVE CHOLANGIOGRAM;  Surgeon: Atilano Ina, MD;  Location: [facility] OR;  Service: General;  Laterality: N/A;  . Wisdom tooth extraction     Family History  Problem Relation Age of Onset  . Anesthesia problems Neg Hx   . Heart disease Mother   . ADD / ADHD Sister    Social History  Substance Use Topics  . Smoking status: Never Smoker   . Smokeless tobacco: Never Used  . Alcohol  Use: No     Comment: occas. when not pregnant   OB History    Gravida Para Term Preterm AB TAB SAB Ectopic Multiple Living   3 2 2  0 0 0 0 0 0 2     Review of Systems  Musculoskeletal: Positive for back pain.  All other systems reviewed and are negative.     Allergies  Other; Soy allergy; Sumatriptan; Zolmitriptan; Ciprofloxacin; Nsaids; Topiramate; Triptans; and Adhesive  Home Medications   Prior to Admission medications   Medication Sig Start Date End Date Taking? Authorizing Provider  acetaminophen (TYLENOL) 500 MG tablet Take 1,000 mg by mouth every 6 (six) hours as needed for moderate pain (Used for migraine.).   Yes Historical Provider, MD  amphetamine-dextroamphetamine (ADDERALL XR) 30 MG 24 hr capsule Take 1 capsule (30 mg total) by mouth daily. 05/21/15  Yes  Asa Lente, MD  norgestrel-ethinyl estradiol (LO/OVRAL,CRYSELLE) 0.3-30 MG-MCG tablet Take 1 tablet by mouth.   Yes Historical Provider, MD  triamcinolone cream (KENALOG) 0.5 % Apply 1 application topically daily as needed (itchy rashes on knees and elbows.).  11/25/14  Yes Historical Provider, MD  FLUoxetine (PROZAC) 40 MG capsule TAKE 1 CAPSULE (40 MG TOTAL) BY MOUTH DAILY. Patient not taking: Reported on 06/05/2015 01/01/15   Cleotis Nipper, MD  LamoTRIgine (LAMICTAL XR) 300 MG TB24 Take 1 tablet (300 mg total) by mouth daily. Patient not taking: Reported on 06/05/2015 10/19/14   Butch Penny, NP   BP 118/48 mmHg  Pulse 97  Temp(Src) 98.2 F (36.8 C) (Oral)  Resp 13  SpO2 98%  LMP 06/19/2014 Physical Exam  Constitutional: She is oriented to person, place, and time. She appears well-developed and well-nourished. No distress.  HENT:  Head: Normocephalic and atraumatic.  Mouth/Throat: Oropharynx is clear and moist. No oropharyngeal exudate.  Eyes: Conjunctivae and EOM are normal. Pupils are equal, round, and reactive to light. Right eye exhibits no discharge. Left eye exhibits no discharge. No scleral icterus.  Neck: Normal range of motion.  NO meningeal signs  Cardiovascular: Normal rate, regular rhythm, normal heart sounds and intact distal pulses.  Exam reveals no gallop and no friction rub.   No murmur heard. Pulmonary/Chest: Effort normal and breath sounds normal. No respiratory distress. She has no wheezes. She has no rales. She exhibits tenderness.  Abdominal: Soft. Bowel sounds are normal. She exhibits no distension and no mass. There is no tenderness. There is no rebound and no guarding.  Musculoskeletal: Normal range of motion. She exhibits tenderness. She exhibits no edema.  Midline lumbar spinal tenderness. No decrease ROM of back. No obvious bony deformity.   Neurological: She is alert and oriented to person, place, and time. No cranial nerve deficit.  Strength 4/5 in LLE  and LUE. Strength 5/5 in RLE and RUE. No sensory deficits.  No gait abnormality.  Normal finger to nose, heel to shin.  Skin: Skin is warm and dry. No rash noted. She is not diaphoretic. No erythema. No pallor.  Psychiatric: She has a normal mood and affect. Her behavior is normal.  Nursing note and vitals reviewed.   ED Course  Procedures (including critical care time) Labs Review Labs Reviewed  BASIC METABOLIC PANEL - Abnormal; Notable for the following:    Glucose, Bld 122 (*)    All other components within normal limits  CBC WITH DIFFERENTIAL/PLATELET - Abnormal; Notable for the following:    WBC 12.2 (*)    Neutrophils Relative % 89 (*)  Neutro Abs 10.8 (*)    Lymphocytes Relative 6 (*)    All other components within normal limits  I-STAT TROPOININ, ED  I-STAT BETA HCG BLOOD, ED ([facility], [facility], [facility] ONLY)    Imaging Review Dg Cervical Spine Complete  06/05/2015   CLINICAL DATA:  Left-sided neck pain with numbness down the left arm and leg for 1 month.  EXAM: CERVICAL SPINE  4+ VIEWS  COMPARISON:  Cervical spine MRI 12/15/2014  FINDINGS: No evidence of fracture or subluxation. Reversal cervical lordosis which is likely positional. No endplate erosion or notable degenerative changes. There is limited visualization of the lower cervical spine due to overlapping soft tissues. Limited right oblique radiographs but no suspected osseous foraminal stenosis. Clear apical lungs.  IMPRESSION: 1. No explanation for neck pain. 2. Limited lateral visualization of the lower cervical spine.   Electronically Signed   By: Marnee Spring M.D.   On: 06/05/2015 14:43   Dg Thoracic Spine 2 View  06/05/2015   CLINICAL DATA:  Back pain. History of spinal surgery and degenerative disc disease.  EXAM: THORACIC SPINE 2 VIEWS  COMPARISON:  11/04/2010  FINDINGS: There is no evidence of thoracic spine fracture. Alignment is normal. No other significant bone abnormalities are identified.  IMPRESSION: Negative.    Electronically Signed   By: Marnee Spring M.D.   On: 06/05/2015 14:41   Dg Lumbar Spine Complete  06/05/2015   CLINICAL DATA:  Left-sided back pain. Numbness radiating to left leg.  EXAM: LUMBAR SPINE - COMPLETE 4+ VIEW  COMPARISON:  04/04/2014  FINDINGS: There is no vertebral compression deformity. Anatomic alignment. Stable mild narrowing of the L4-5 disc. Stable moderate narrowing of the L5-S1 disc. No definite fracture.  IMPRESSION: No acute bony pathology.  Degenerative change.   Electronically Signed   By: Jolaine Click M.D.   On: 06/05/2015 14:47   I have personally reviewed and evaluated these images and lab results as part of my medical decision-making.   EKG Interpretation None      MDM   Final diagnoses:  Chronic back pain    Pt seen for chronic back pain with new onset left arm and leg numbness. Xrays of spine were negative for fracture or  bony pathology. No red flag back pain symptoms at this time. No concern for cauda equina. Mild increase in WBC, nonspecific. Likely unremarkable finding. Strongly recommend follow up with PCP for further pani management. Pain continues to state that she is not under pain contract, nor has seh seen a pain management physician. Return precautions outlined in patient discharge instructions.      Lester Kinsman De Pue, PA-C 06/05/15 1641  Rachael Bilis, MD 06/05/15 [phone]

## 2015-06-05 NOTE — Discharge Instructions (Signed)
Back Pain, Adult °Low back pain is very common. About 1 in 5 people have back pain. The cause of low back pain is rarely dangerous. The pain often gets better over time. About half of people with a sudden onset of back pain feel better in just 2 weeks. About 8 in 10 people feel better by 6 weeks.  °CAUSES °Some common causes of back pain include: °· Strain of the muscles or ligaments supporting the spine. °· Wear and tear (degeneration) of the spinal discs. °· Arthritis. °· Direct injury to the back. °DIAGNOSIS °Most of the time, the direct cause of low back pain is not known. However, back pain can be treated effectively even when the exact cause of the pain is unknown. Answering your caregiver's questions about your overall health and symptoms is one of the most accurate ways to make sure the cause of your pain is not dangerous. If your caregiver needs more information, he or she may order lab work or imaging tests (X-rays or MRIs). However, even if imaging tests show changes in your back, this usually does not require surgery. °HOME CARE INSTRUCTIONS °For many people, back pain returns. Since low back pain is rarely dangerous, it is often a condition that people can learn to manage on their own.  °· Remain active. It is stressful on the back to sit or stand in one place. Do not sit, drive, or stand in one place for more than 30 minutes at a time. Take short walks on level surfaces as soon as pain allows. Try to increase the length of time you walk each day. °· Do not stay in bed. Resting more than 1 or 2 days can delay your recovery. °· Do not avoid exercise or work. Your body is made to move. It is not dangerous to be active, even though your back may hurt. Your back will likely heal faster if you return to being active before your pain is gone. °· Pay attention to your body when you  bend and lift. Many people have less discomfort when lifting if they bend their knees, keep the load close to their bodies, and  avoid twisting. Often, the most comfortable positions are those that put less stress on your recovering back. °· Find a comfortable position to sleep. Use a firm mattress and lie on your side with your knees slightly bent. If you lie on your back, put a pillow under your knees. °· Only take over-the-counter or prescription medicines as directed by your caregiver. Over-the-counter medicines to reduce pain and inflammation are often the most helpful. Your caregiver may prescribe muscle relaxant drugs. These medicines help dull your pain so you can more quickly return to your normal activities and healthy exercise. °· Put ice on the injured area. °¨ Put ice in a plastic bag. °¨ Place a towel between your skin and the bag. °¨ Leave the ice on for 15-20 minutes, 03-04 times a day for the first 2 to 3 days. After that, ice and heat may be alternated to reduce pain and spasms. °· Ask your caregiver about trying back exercises and gentle massage. This may be of some benefit. °· Avoid feeling anxious or stressed. Stress increases muscle tension and can worsen back pain. It is important to recognize when you are anxious or stressed and learn ways to manage it. Exercise is a great option. °SEEK MEDICAL CARE IF: °· You have pain that is not relieved with rest or medicine. °· You have pain that does not improve in 1 week. °· You have new symptoms. °· You are generally not feeling well. °SEEK   IMMEDIATE MEDICAL CARE IF:   You have pain that radiates from your back into your legs.  You develop new bowel or bladder control problems.  You have unusual weakness or numbness in your arms or legs.  You develop nausea or vomiting.  You develop abdominal pain.  You feel faint. Document Released: 09/11/2005 Document Revised: 03/12/2012 Document Reviewed: 01/13/2014 [facility] Patient Information 2015 [place], [place]. This information is not intended to replace advice given to you by your health care provider. Make sure you  discuss any questions you have with your health care provider.  Follow up with PCP for long term management of back pain.

## 2015-06-05 NOTE — ED Provider Notes (Signed)
Percocet prescription printed on plain paper.  Prior provider had gone home.  Reprinted prescription on prescription paper.    Roxy Horseman, PA-C 06/05/15 1758  Laurence Spates, MD 06/05/15 [phone]

## 2015-06-29 ENCOUNTER — Other Ambulatory Visit: Payer: Self-pay | Admitting: Neurology

## 2015-06-29 NOTE — Telephone Encounter (Signed)
Pt called and needs refill on amphetamine-dextroamphetamine (ADDERALL XR) 30 MG 24 hr capsule. Thank you

## 2015-06-30 ENCOUNTER — Other Ambulatory Visit: Payer: Self-pay | Admitting: Neurology

## 2015-06-30 ENCOUNTER — Other Ambulatory Visit: Payer: Self-pay

## 2015-06-30 IMAGING — US US OB TRANSVAGINAL
1 series · 14 of 28 positions shown · non-contrast
Comparison: 08/04/2014

CLINICAL DATA: 28-year-old pregnant female with pelvic pain and
spotting. Estimated gestational age of 9 weeks 2 days by first
ultrasound.

EXAM:
TRANSVAGINAL OB ULTRASOUND
TECHNIQUE: Transvaginal ultrasound was performed for complete evaluation of the
gestation as well as the maternal uterus, adnexal regions, and
pelvic cul-de-sac.

[Series 1: us ob transvaginal · 14 of 50 slices shown]
[im 2/50]
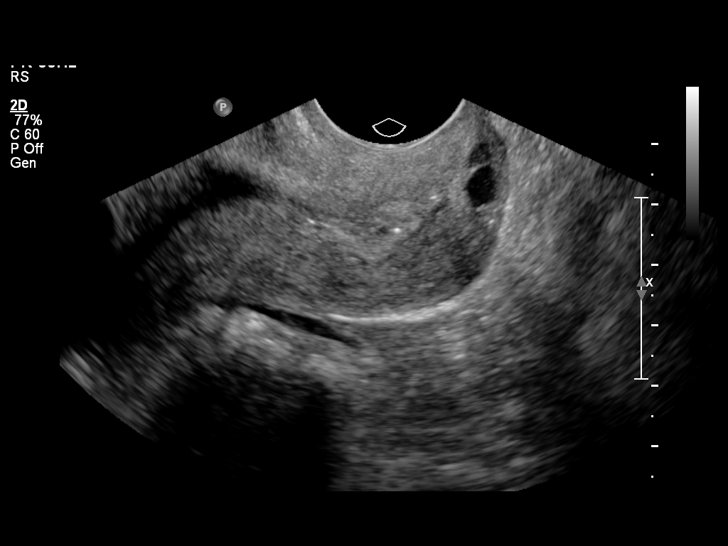
[im 6/50]
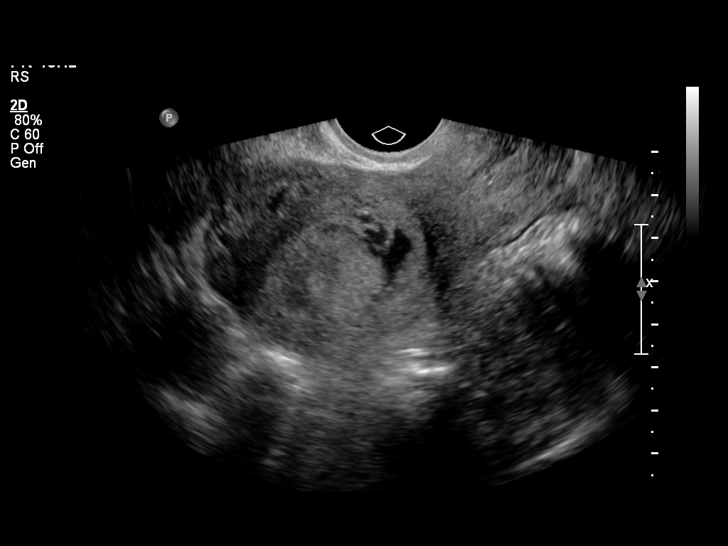
[im 10/50]
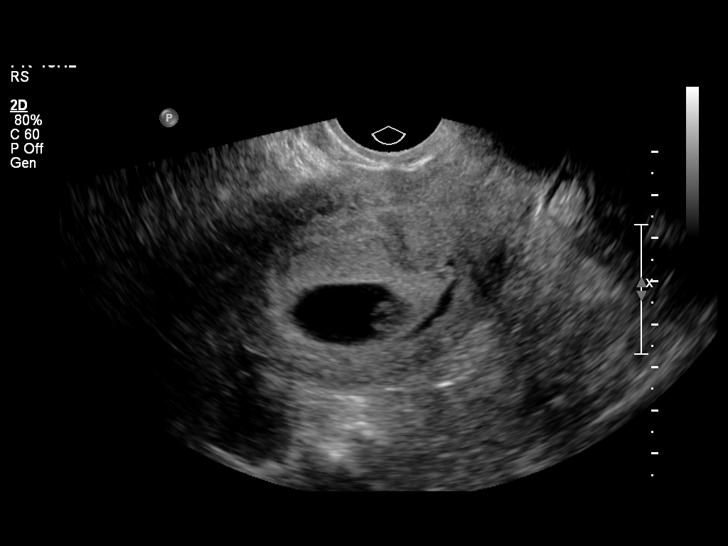
[im 13/50]
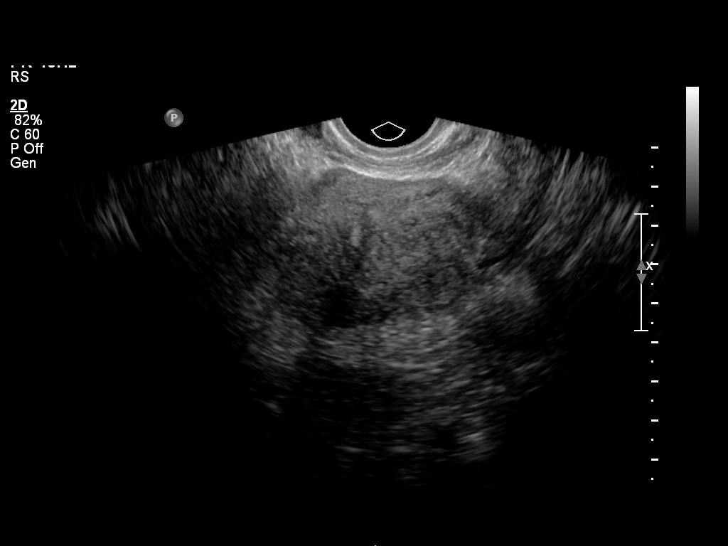
[im 17/50]
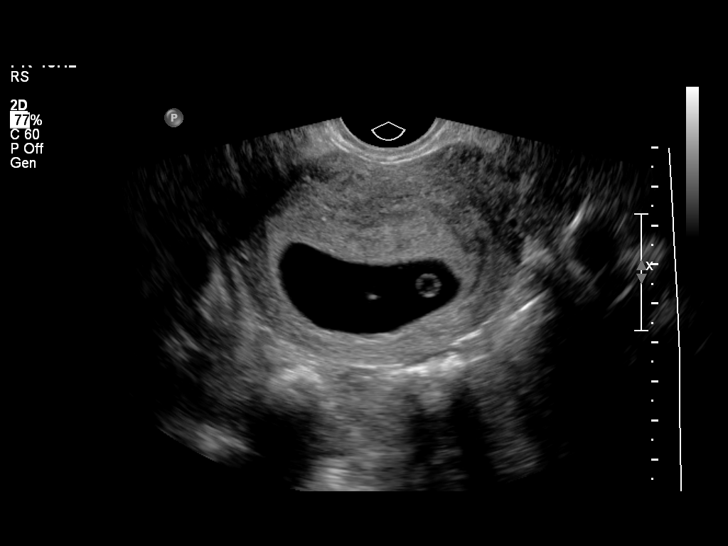
[im 20/50]
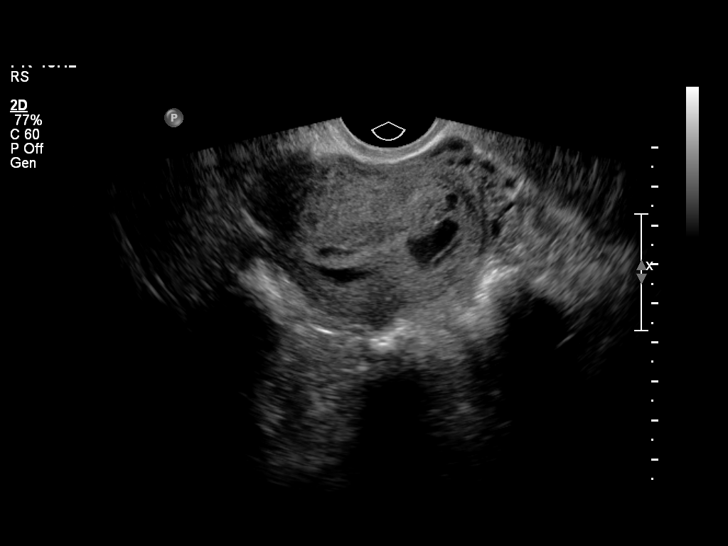
[im 24/50]
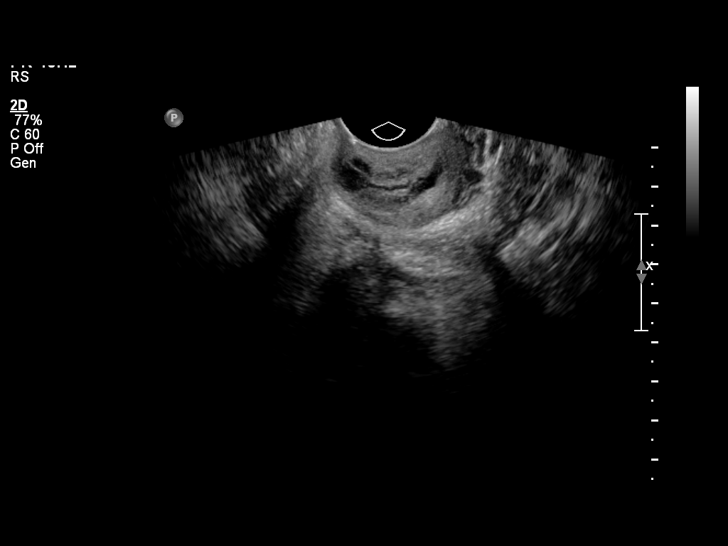
[im 28/50]
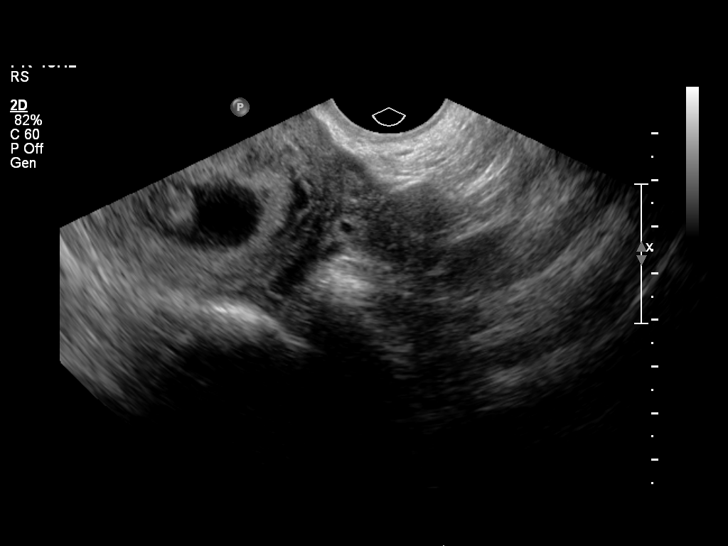
[im 31/50]
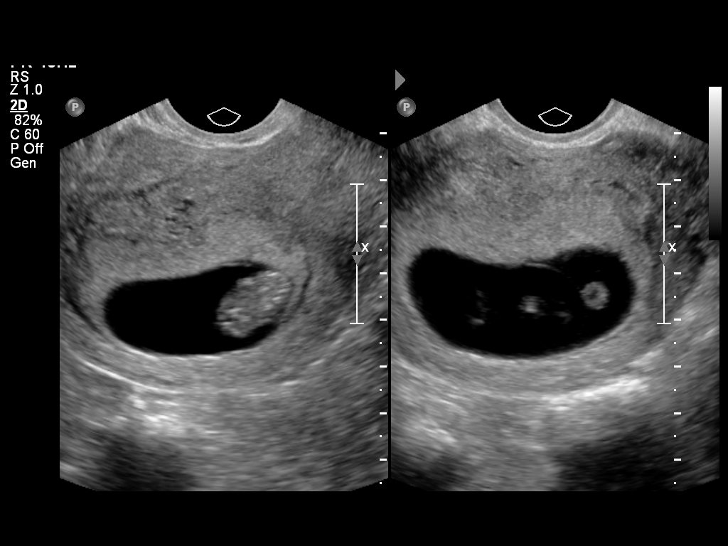
[im 35/50]
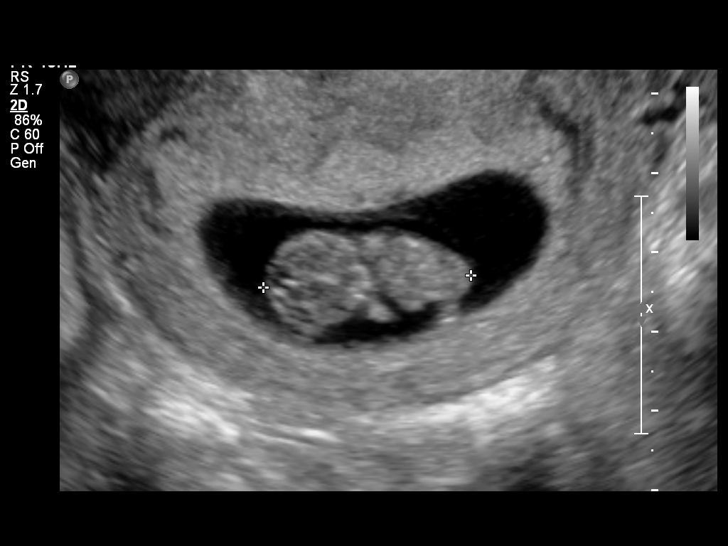
[im 39/50]
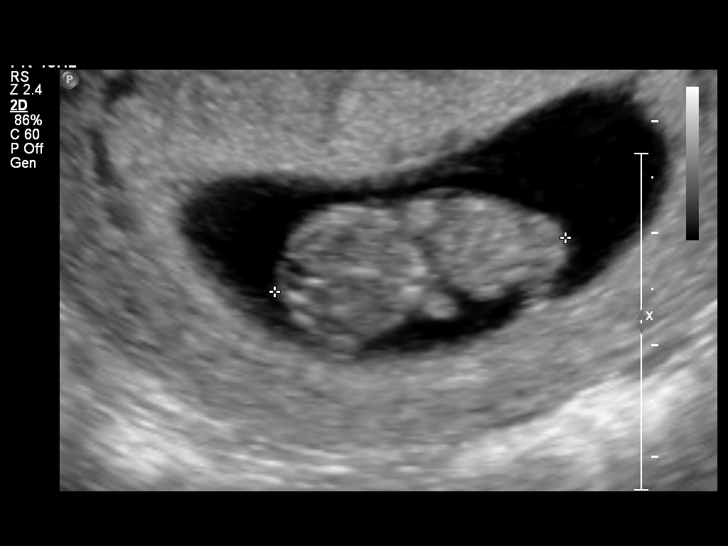
[im 42/50]
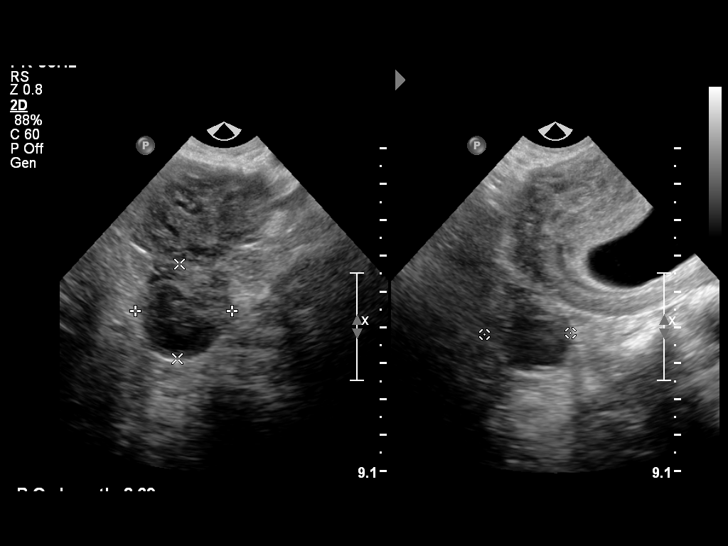
[im 46/50]
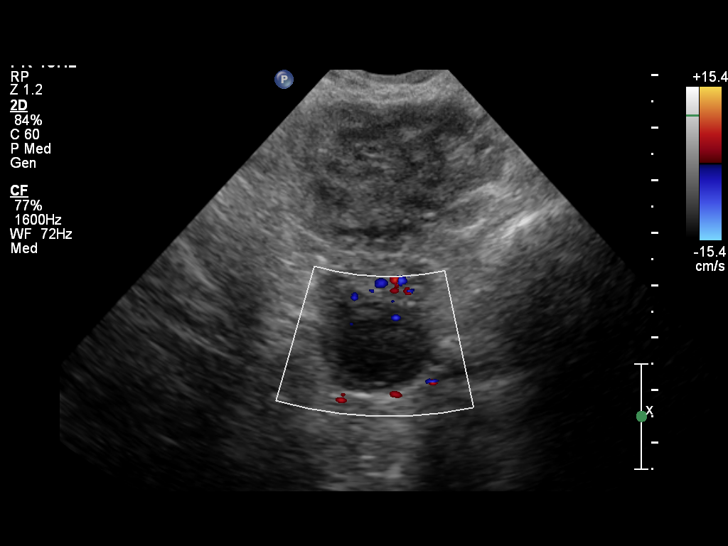
[im 50/50]
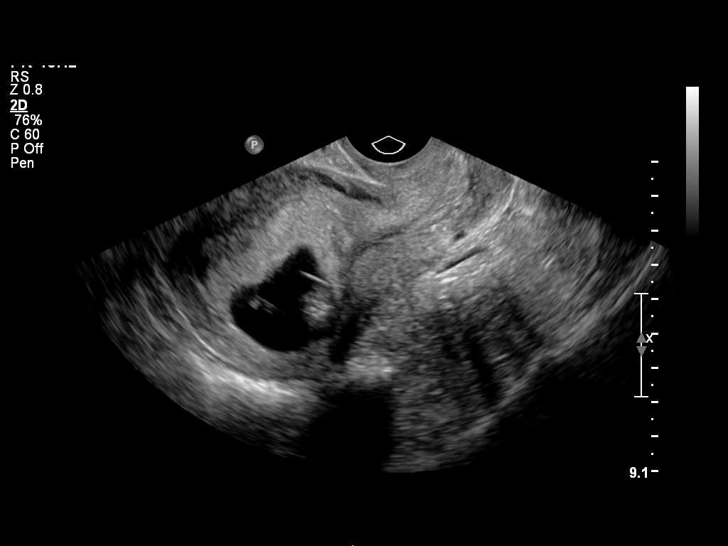

[14 of 28 positions shown; findings below may reference images not displayed]

FINDINGS: Intrauterine gestational sac: Visualized/normal in shape.

Yolk sac:  Visualized

Embryo:  Visualized

Cardiac Activity: Visualized

Heart Rate: 148 bpm

CRL:   26.3  mm   9 w 3 d                  US EDC: 03/26/2015

Maternal uterus/adnexae: A small subchorionic hemorrhage is noted.

Ovaries bilaterally are unremarkable.

There is no evidence of free fluid or adnexal mass.
IMPRESSION: Single living intrauterine gestation with estimated gestational age
of 9 weeks 3 days by this ultrasound and 9 weeks 2 days by first
ultrasound.

Small subchorionic hemorrhage.

## 2015-06-30 MED ORDER — AMPHETAMINE-DEXTROAMPHET ER 30 MG PO CP24: 30.0000 mg | ORAL_CAPSULE | Freq: Every day | ORAL | Status: DC

## 2015-06-30 NOTE — Telephone Encounter (Signed)
Called patient left her a message stating when her RX is ready we will call her for Pick up.

## 2015-06-30 NOTE — Telephone Encounter (Signed)
Patient called to check status of amphetamine-dextroamphetamine (ADDERALL XR) 30 MG 24 hr capsule. Please call patient to advise [phone].

## 2015-06-30 NOTE — Telephone Encounter (Signed)
RX is ready for pick up at the front desk. Called pt to inform her. No answer, left a message asking that this was Baxter Hire, Dr. Oliva Bustard RN, and to please call us back at [phone].

## 2015-06-30 NOTE — Telephone Encounter (Signed)
Dr. Dohmeier is not in the office, the script she printed cannot be signed by any other physician. Dr. Athar agreed to sign for her refill as long as the RX has Dr. Athar's name on it.  

## 2015-07-01 ENCOUNTER — Telehealth: Payer: Self-pay

## 2015-07-01 ENCOUNTER — Other Ambulatory Visit: Payer: Self-pay

## 2015-07-01 MED ORDER — AMPHETAMINE-DEXTROAMPHET ER 30 MG PO CP24: 30.0000 mg | ORAL_CAPSULE | Freq: Every day | ORAL | Status: DC

## 2015-07-01 NOTE — Telephone Encounter (Signed)
Pt called and her RX adderall is ready for pick up. The one signed by Dr. Frances Furbish yesterday has been misplaced, cannot be found, so Dr. Kyla Balzarine agreed to sign a replacement. RX is up front for pick up.

## 2015-07-14 IMAGING — US US RENAL
1 series · 14 of 25 positions shown · non-contrast
Comparison: CT abdomen 04/04/2014

CLINICAL DATA: Right flank pain, initial encounter

EXAM:
RENAL/URINARY TRACT ULTRASOUND COMPLETE

[Series 1: us renal · 0.30mm/px · 14 of 31 slices shown]
[im 1/31]
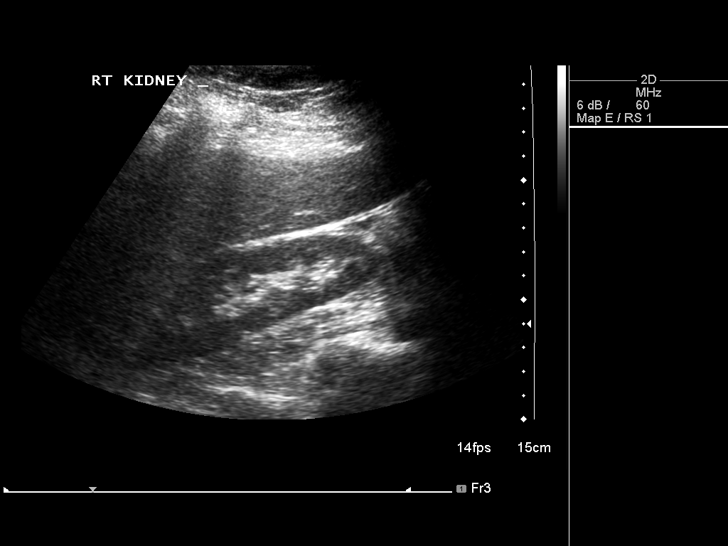
[im 3/31]
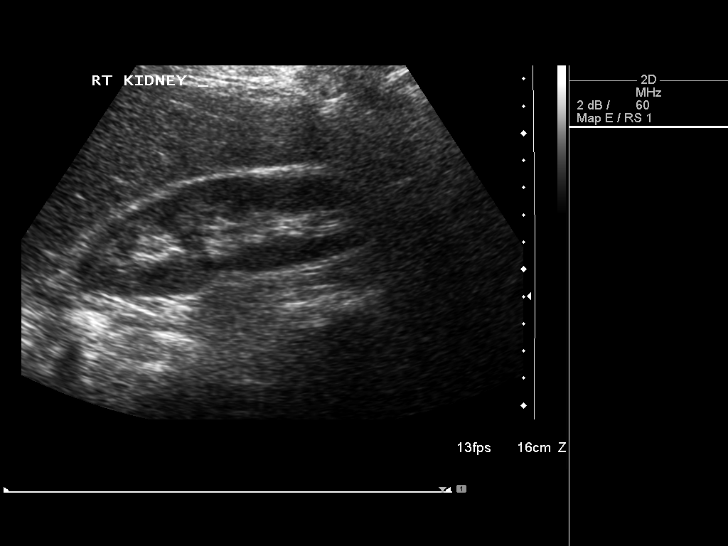
[im 6/31]
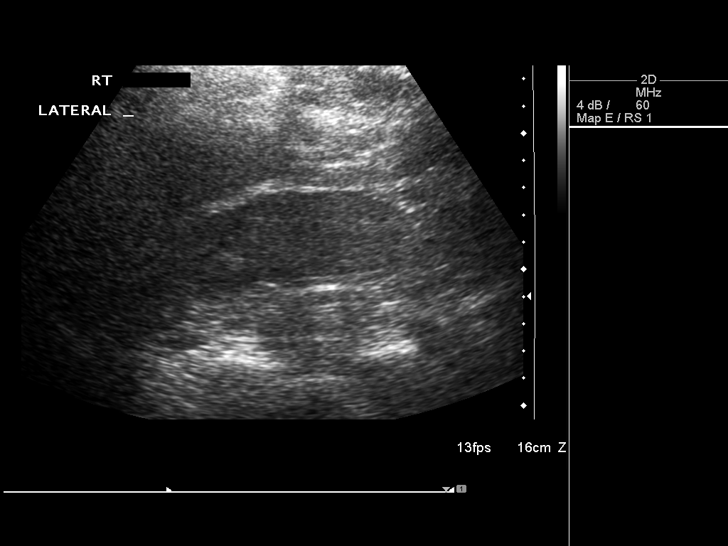
[im 8/31]
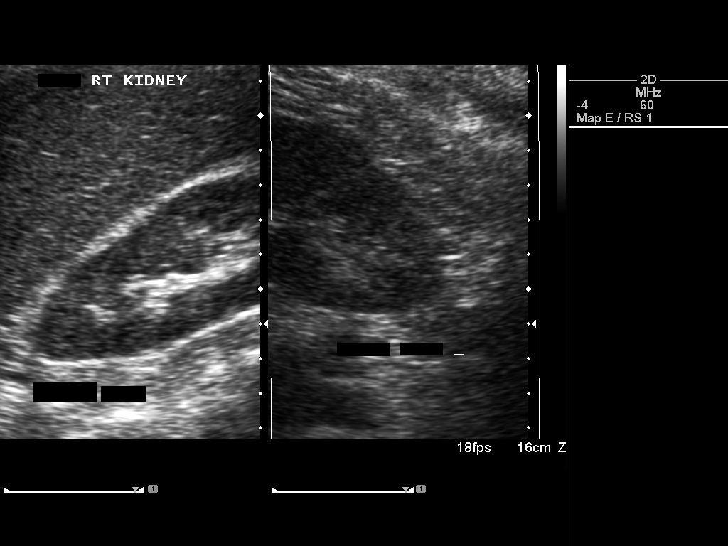
[im 11/31]
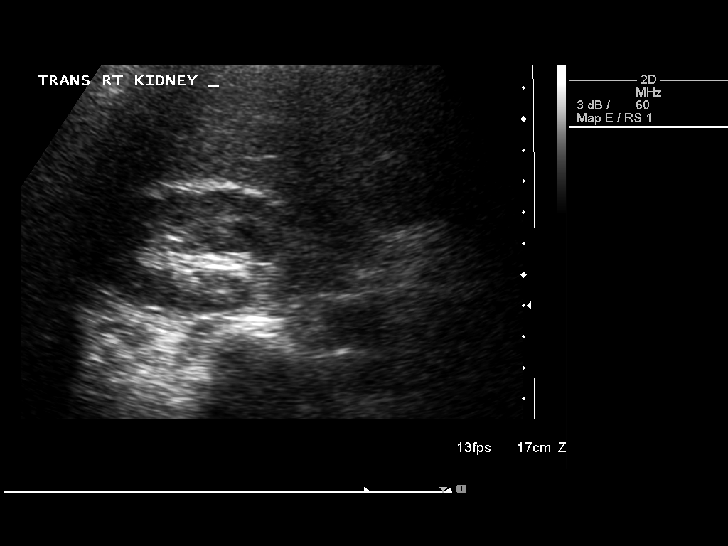
[im 12/31]
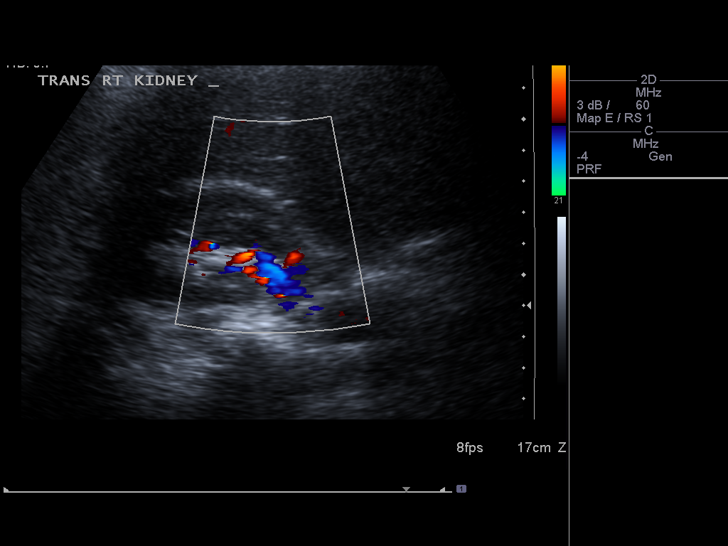
[im 14/31]
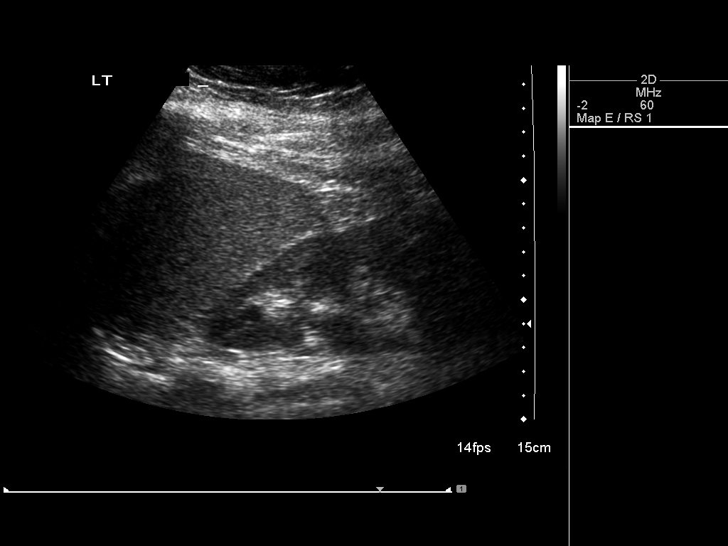
[im 17/31]
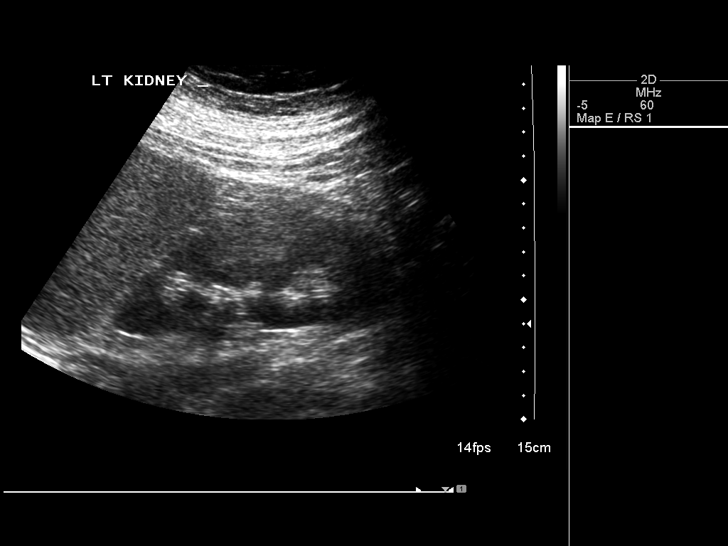
[im 19/31]
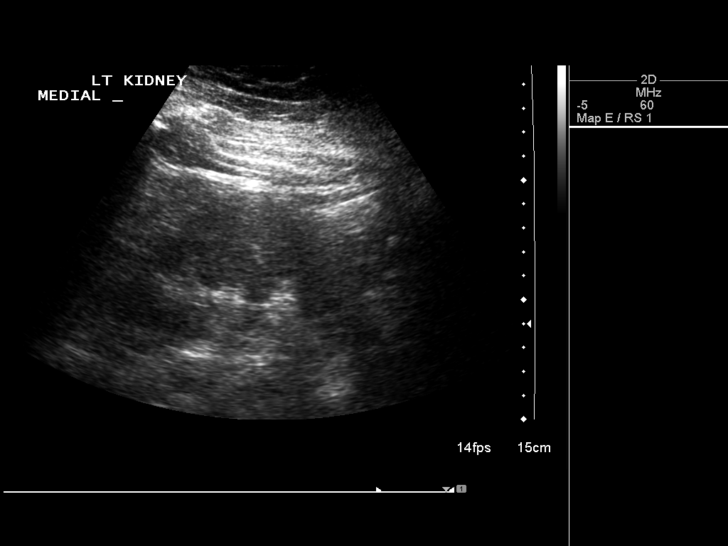
[im 21/31]
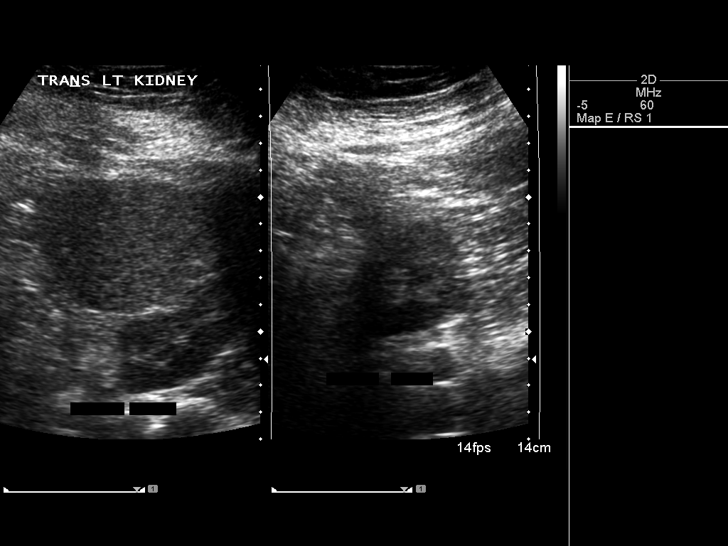
[im 23/31]
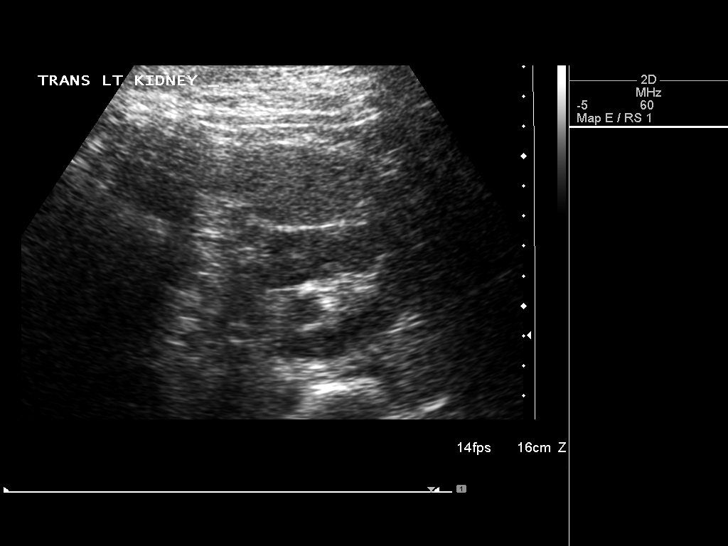
[im 26/31]
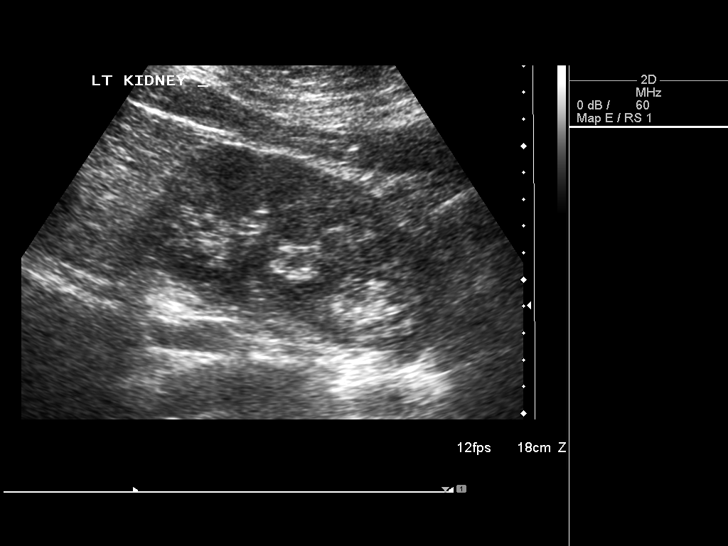
[im 28/31]
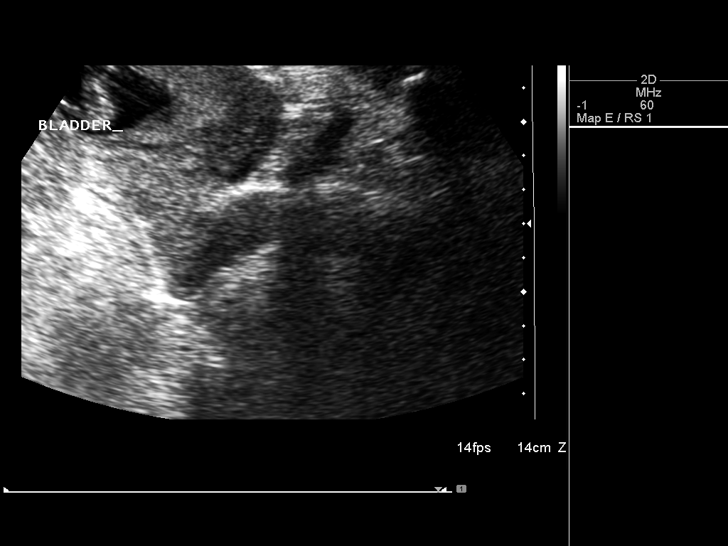
[im 31/31]
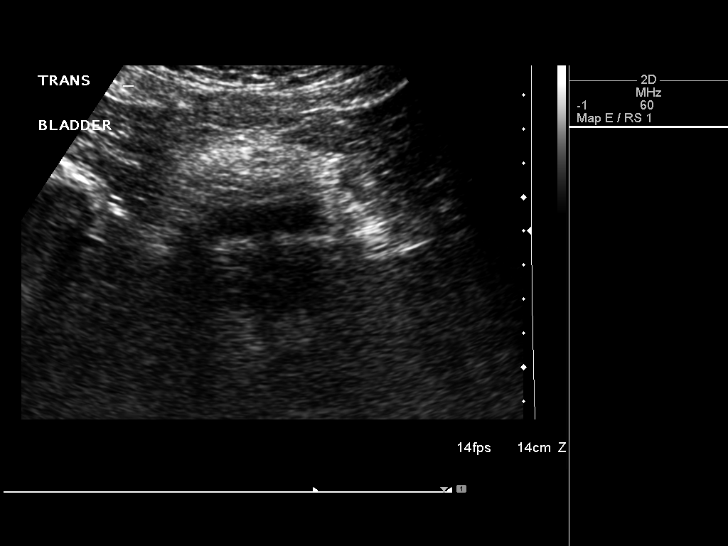

[14 of 25 positions shown; findings below may reference images not displayed]

FINDINGS: Right Kidney:

Length: 11.7 cm. Echogenicity within normal limits. No mass or
hydronephrosis visualized.

Left Kidney:

Length: 12.4 cm. Echogenicity within normal limits. No mass or
hydronephrosis visualized.

Bladder:

Suboptimally evaluated secondary to underdistention. Patient voided
prior to the exam.
IMPRESSION: Normal renal ultrasound.

## 2015-07-16 ENCOUNTER — Telehealth: Payer: Self-pay

## 2015-07-16 NOTE — Telephone Encounter (Addendum)
[employer] has approved the request for continuation of coverage on Adderall XR effective until 07/14/2018 or until the policy changes or is terminated.  Ref # [id number]

## 2015-07-22 NOTE — Telephone Encounter (Signed)
Error

## 2015-08-23 ENCOUNTER — Telehealth: Payer: Self-pay

## 2015-08-23 ENCOUNTER — Other Ambulatory Visit: Payer: Self-pay | Admitting: Neurology

## 2015-08-23 MED ORDER — AMPHETAMINE-DEXTROAMPHET ER 30 MG PO CP24: 30.0000 mg | ORAL_CAPSULE | Freq: Every day | ORAL | Status: DC

## 2015-08-23 NOTE — Telephone Encounter (Signed)
Patient is calling for a written Rx amphetamine-dextroamphetamine 30 mg 24 hr capsule.  Thanks!

## 2015-08-23 NOTE — Telephone Encounter (Signed)
Called pt to inform her that her RX for adderall is ready for pick up at the front desk. No answer, left a message asking that she call us back.  If pt calls back, please remind her to make a follow up appt. It has been a year since her last appt.

## 2015-09-13 ENCOUNTER — Encounter: Payer: Self-pay | Admitting: Adult Health

## 2015-09-13 ENCOUNTER — Ambulatory Visit (INDEPENDENT_AMBULATORY_CARE_PROVIDER_SITE_OTHER): Payer: BLUE CROSS/BLUE SHIELD | Admitting: Adult Health

## 2015-09-13 VITALS — BP 119/78 | HR 85 | Ht 61.5 in | Wt 165.0 lb

## 2015-09-13 DIAGNOSIS — R569 Unspecified convulsions: Secondary | ICD-10-CM | POA: Diagnosis not present

## 2015-09-13 DIAGNOSIS — G47419 Narcolepsy without cataplexy: Secondary | ICD-10-CM | POA: Diagnosis not present

## 2015-09-13 MED ORDER — AMPHETAMINE-DEXTROAMPHETAMINE 5 MG PO TABS: ORAL_TABLET | ORAL | Status: DC

## 2015-09-13 NOTE — Progress Notes (Signed)
PATIENT: [name] DOB: 1986/08/20  REASON FOR VISIT: follow up-  Seizures, narcolepsy HISTORY FROM: patient  HISTORY OF PRESENT ILLNESS:  [name] is a 29 year old female with a history of seizures and narcolepsy. She returns today for follow-up. The patient continues to take Adderall extended release 30 mg daily. She reports that initially this was working very well. She states that for the last month she's noticed that she has began to be more sleepy in the afternoons. She states usually around 3:00PM she has the most trouble. She state that she usually does not drive at night due to her sleepiness. She reports that she no longer takes the Klonopin at bedtime. She states that the Adderall has been working well to keep her alert during the day that she no longer  needs the Klonopin. The patient was also on Lamictal for seizures. She states that she is no longer taking the Lamictal. She states that she has not had a seizure in over a year. She states that in the past she's had 2 grand mal seizures. She feels that since she's been taking Adderall this is made her feel overall better. The patient is reporting that she's been having hip pain bilateral. Occasional have discomfort in the right leg. In the past she's had to back surgeries. She has not followed up with her orthopedic and or 4 years. She denies any new neurological symptoms she returns today for an evaluation.  HISTORY 08/25/14: [name] is a 29 year old female with a history of seizures and narcolepsy. She returns today for follow-up. The patient is currently [redacted] weeks pregnant. She was taken off her medication for narcolepsy until after the first trimester. She continues to take Lamictal for seizures. Patient is very tearful during the visit. She states that she is very depressed since coming off her adderall. She states that she is very tired throughout the day and her husband has even noticed a changed in her mood. She states that she  has even had thoughts of "getting rid of the baby" so she can feel normal. She denies any seizure events and is tolerating Lamictal fine. Other than the pregnancy,no new medical history since last seen.   HISTORY 06/29/14: [name] is a 29 year old female with a history of seizures and narcolepsy. She returns today for follow-up. She is currently taking Lamictal 300 mg daily for seizures and Provigil 200 mg for daytime sleepiness. She reports that she continues to feel fatigued and sleepy throughout the day. Her Epworth is 20 and Fatigue Severity score is 60. She feels that the Provigil offers some relief but would like to try something else.She sleeps 6-7 hours a night. Goes to bed between 11:00 pm- 12:00 am and arises at 6:30 am. She does have trouble falling asleep and therefore some nights she will only get 3-4 hours of sleep. She does use Klonopin to help her fall asleep. She reports that Lamictal has controlled her seizures. Her last seizure was in April.    REVIEW OF SYSTEMS: Out of a complete 14 system review of symptoms, the patient complains only of the following symptoms, and all other reviewed systems are negative.   frequent waking, daytime sleepiness, joint pain, walking difficulty  ALLERGIES: Allergies  Allergen Reactions  . Other Anaphylaxis    balsalmic vingerette  . Soy Allergy Anaphylaxis  . Sumatriptan     Other reaction(s): Other unknown  . Zolmitriptan Other (See Comments)    Other reaction(s): Other  unknown Reaction unknown  . Ciprofloxacin Other (See Comments)    Patient stated that her arm started burning really intensely.  . Nsaids Other (See Comments)    Told to avoid NSAIDS due to her kidney disorder.  . Topiramate Other (See Comments)    Reaction unknown, childhood allergy.  . Triptans     Happened maybe in '05 possibly, reaction unsure.   . Adhesive [Tape] Itching and Rash    HOME MEDICATIONS: Outpatient Prescriptions Prior to Visit  Medication Sig  Dispense Refill  . amphetamine-dextroamphetamine (ADDERALL XR) 30 MG 24 hr capsule Take 1 capsule (30 mg total) by mouth daily. 30 capsule 0  . norgestrel-ethinyl estradiol (LO/OVRAL,CRYSELLE) 0.3-30 MG-MCG tablet Take 1 tablet by mouth.    . triamcinolone cream (KENALOG) 0.5 % Apply 1 application topically daily as needed (itchy rashes on knees and elbows.).     Marland Kitchen acetaminophen (TYLENOL) 500 MG tablet Take 1,000 mg by mouth every 6 (six) hours as needed for moderate pain (Used for migraine.).    Marland Kitchen FLUoxetine (PROZAC) 40 MG capsule TAKE 1 CAPSULE (40 MG TOTAL) BY MOUTH DAILY. (Patient not taking: Reported on 06/05/2015) 30 capsule 0  . LamoTRIgine (LAMICTAL XR) 300 MG TB24 Take 1 tablet (300 mg total) by mouth daily. (Patient not taking: Reported on 06/05/2015) 30 tablet 5  . ondansetron (ZOFRAN) 4 MG tablet Take 1 tablet (4 mg total) by mouth every 6 (six) hours. 12 tablet 0   No facility-administered medications prior to visit.    PAST MEDICAL HISTORY: Past Medical History  Diagnosis Date  . MIGRAINES, HX OF 06/24/2007  . Gallstones   . IBS (irritable bowel syndrome)     doesn't take any meds  . OCD (obsessive compulsive disorder)     takes Prozac daily  . Anxiety     takes Klonopin nightly  . ASTHMA NOS W/ACUTE EXACERBATION 01/05/2010    exercise induced  . History of migraine     last one a couple of days ago  . Coarse tremors   . Seizures (HCC)     takes Lamictal daily;last seizure was in 11/14/10  . Degenerative disc disease     back  . Back pain   . GERD (gastroesophageal reflux disease)     but doesn't take anything for it  . Urinary frequency   . Medullary sponge kidney 2007    kidneys can be sick but she isnt sick  . PYELONEPHRITIS 06/24/2007  . History of UTI     frequent  . History of kidney stones   . Anemia     takes Ferrous Sulfate daily  . Narcolepsy   . 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 Back  surgery    . Lumbar laminectomy/decompression microdiscectomy  07/24/2012    Procedure: LUMBAR LAMINECTOMY/DECOMPRESSION MICRODISCECTOMY;  Surgeon: Jacki Cones, MD;  Location: [facility];  Service: Orthopedics;  Laterality: Right;  L5-S1  . Cholecystectomy N/A 12/02/2013    Procedure: LAPAROSCOPIC CHOLECYSTECTOMY WITH ATTEMPTED INTRAOPERATIVE CHOLANGIOGRAM;  Surgeon: Atilano Ina, MD;  Location: [facility] OR;  Service: General;  Laterality: N/A;  . Wisdom tooth extraction      FAMILY HISTORY: Family History  Problem Relation Age of Onset  . Anesthesia problems Neg Hx   . Heart disease Mother   . ADD / ADHD Sister     SOCIAL HISTORY: Social History   Social History  . Marital Status: Married    Spouse  Name: N/A  . Number of Children: 2  . Years of Education: 14   Occupational History  . 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 Smoking status: Never Smoker   . Smokeless tobacco: Never Used  . Alcohol Use: No     Comment: occas. when not pregnant  . Drug Use: No  . Sexual Activity:    Partners: Male     Comment: last sex  Aug 22 2014   Other Topics Concern  . Not on file   Social History Narrative   HSG, finished 2 years of college. single mom with 2 sons blake - Aug '09, Elijah Dec '12. 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 Still lives with father of her sons with wedding plans on hold ( Jan '13)      PHYSICAL EXAM  Filed Vitals:   09/13/15 0923  BP: 119/78  Pulse: 85  Height: 5' 1.5" (1.562 m)  Weight: 165 lb (74.844 kg)   Body mass index is 30.68 kg/(m^2).  Generalized: Well developed, in no acute distress   Neurological examination  Mentation: Alert oriented to time, place, history taking. Follows all commands speech and language fluent Cranial nerve II-XII: Pupils were equal round reactive to light. Extraocular movements were full, visual field were full on confrontational test. Facial  sensation and strength were normal. Uvula tongue midline. Head turning and shoulder shrug  were normal and symmetric. Motor: The motor testing reveals 5 over 5 strength of all 4 extremities. Good symmetric motor tone is noted throughout.  Sensory: Sensory testing is intact to soft touch on all 4 extremities. No evidence of extinction is noted.  Coordination: Cerebellar testing reveals good finger-nose-finger and heel-to-shin bilaterally.  Gait and station: Gait is normal. Tandem gait is normal. Romberg is negative. No drift is seen.  Reflexes: Deep tendon reflexes are symmetric and normal bilaterally.   DIAGNOSTIC DATA (LABS, IMAGING, TESTING) - I reviewed patient records, labs, notes, testing and imaging myself where available.  Lab Results  Component Value Date   WBC 12.2* 06/05/2015   HGB 13.3 06/05/2015   HCT 39.9 06/05/2015   MCV 90.7 06/05/2015   PLT 234 06/05/2015      Component Value Date/Time   NA 137 06/05/2015 1319   NA 141 10/30/2013 1615   K 3.5 06/05/2015 1319   CL 107 06/05/2015 1319   CO2 23 06/05/2015 1319   GLUCOSE 122* 06/05/2015 1319   GLUCOSE 85 10/30/2013 1615   BUN 11 06/05/2015 1319   BUN 10 10/30/2013 1615   CREATININE 0.89 06/05/2015 1319   CREATININE 0.77 12/23/2013 0837   CALCIUM 9.1 06/05/2015 1319   PROT 6.9 09/22/2014 1855   PROT 7.2 10/30/2013 1615   ALBUMIN 3.4* 09/22/2014 1855   ALBUMIN 4.3 10/30/2013 1615   AST 12 09/22/2014 1855   ALT 13 09/22/2014 1855   ALKPHOS 73 09/22/2014 1855   BILITOT 0.4 09/22/2014 1855   GFRNONAA >60 06/05/2015 1319   GFRAA >60 06/05/2015 1319   Lab Results  Component Value Date   VITAMINB12 275 10/30/2013       ASSESSMENT AND PLAN 29 y.o. year old female  has a past medical history of MIGRAINES, HX OF (06/24/2007); Gallstones; IBS (irritable bowel syndrome); OCD (obsessive compulsive disorder); Anxiety; ASTHMA NOS W/ACUTE EXACERBATION (01/05/2010); History of migraine; Coarse tremors; Seizures (HCC);  Degenerative disc disease; Back pain; GERD (gastroesophageal reflux disease);  Urinary frequency; Medullary sponge kidney (2007); PYELONEPHRITIS (06/24/2007); History of UTI; History of kidney stones; Anemia; Narcolepsy; and Narcolepsy (05/18/2014). here with:   1. Narcolepsy 2. Seizures   Overall the patient is doing well. She does report that she is  beginning to be more sleepy in the afternoons. She will continue taking Adderall extended release 30 mg daily. I will give the patient Adderall immediate release 5 mg to take it lunch as needed to help with afternoon sleepiness. Patient verbalized understanding. Also explained to the patient the risk associated with seizures. At this time the patient is adamant that she does not want to be on any medication. She will follow-up in 3-4 months with Dr. Vickey Hugerohmeier.     Butch PennyMegan Levii Hairfield, MSN, NP-C 09/13/2015, 9:45 AM [facility] [address], [address] [place], [place] [address] [phone]

## 2015-09-13 NOTE — Progress Notes (Signed)
I agree with the assessment and plan as directed by NP .The patient is known to me .   Janica Eldred, MD  

## 2015-09-13 NOTE — Patient Instructions (Addendum)
Continue Adderall XR 30 mg daily  Add Adderall immediate release 5 mg at lunch if needed.

## 2015-09-28 ENCOUNTER — Other Ambulatory Visit: Payer: Self-pay | Admitting: Adult Health

## 2015-09-28 MED ORDER — AMPHETAMINE-DEXTROAMPHET ER 30 MG PO CP24: 30.0000 mg | ORAL_CAPSULE | Freq: Every day | ORAL | Status: DC

## 2015-09-28 NOTE — Telephone Encounter (Signed)
Request entered, forwarded to provider for approval.   I called the pharmacy regarding Adderall 5mg .  Spoke with Revonda StandardAllison.  She processed Rx, and said it goes through without any problems.  They will fill Rx as prescribed and notify patient when it's ready for pick up.

## 2015-09-28 NOTE — Telephone Encounter (Signed)
Patient is calling to order her written Rx amphetamine-dextroamphetamine 30 mg mg 24 hr capsule. Patient is also asking if her Rx amphetamine-dextroamphetamine 5 mg has been approved.  Please call.  Thanks!

## 2015-09-28 NOTE — Telephone Encounter (Addendum)
Called patient and informed Rx ready for pick up at front desk. No answer. Left vmail. 

## 2015-10-20 ENCOUNTER — Encounter (HOSPITAL_COMMUNITY): Payer: Self-pay | Admitting: Emergency Medicine

## 2015-10-20 ENCOUNTER — Emergency Department (HOSPITAL_COMMUNITY)
Admission: EM | Admit: 2015-10-20 | Discharge: 2015-10-20 | Disposition: A | Discharge status: Home / Self Care | Payer: Worker's Compensation | Attending: Emergency Medicine | Admitting: Emergency Medicine

## 2015-10-20 ENCOUNTER — Emergency Department (HOSPITAL_COMMUNITY): Payer: Worker's Compensation

## 2015-10-20 DIAGNOSIS — Z8679 Personal history of other diseases of the circulatory system: Secondary | ICD-10-CM | POA: Diagnosis not present

## 2015-10-20 DIAGNOSIS — D642 Secondary sideroblastic anemia due to drugs and toxins: Secondary | ICD-10-CM | POA: Diagnosis not present

## 2015-10-20 DIAGNOSIS — J45901 Unspecified asthma with (acute) exacerbation: Secondary | ICD-10-CM | POA: Insufficient documentation

## 2015-10-20 DIAGNOSIS — Q615 Medullary cystic kidney: Secondary | ICD-10-CM | POA: Insufficient documentation

## 2015-10-20 DIAGNOSIS — F429 Obsessive-compulsive disorder, unspecified: Secondary | ICD-10-CM | POA: Diagnosis not present

## 2015-10-20 DIAGNOSIS — Z8669 Personal history of other diseases of the nervous system and sense organs: Secondary | ICD-10-CM | POA: Diagnosis not present

## 2015-10-20 DIAGNOSIS — S4991XD Unspecified injury of right shoulder and upper arm, subsequent encounter: Secondary | ICD-10-CM | POA: Insufficient documentation

## 2015-10-20 DIAGNOSIS — Z793 Long term (current) use of hormonal contraceptives: Secondary | ICD-10-CM | POA: Insufficient documentation

## 2015-10-20 DIAGNOSIS — F419 Anxiety disorder, unspecified: Secondary | ICD-10-CM | POA: Insufficient documentation

## 2015-10-20 DIAGNOSIS — Z79899 Other long term (current) drug therapy: Secondary | ICD-10-CM | POA: Diagnosis not present

## 2015-10-20 DIAGNOSIS — Z87442 Personal history of urinary calculi: Secondary | ICD-10-CM | POA: Insufficient documentation

## 2015-10-20 DIAGNOSIS — S6991XD Unspecified injury of right wrist, hand and finger(s), subsequent encounter: Secondary | ICD-10-CM | POA: Insufficient documentation

## 2015-10-20 DIAGNOSIS — W1839XD Other fall on same level, subsequent encounter: Secondary | ICD-10-CM | POA: Insufficient documentation

## 2015-10-20 DIAGNOSIS — Z87440 Personal history of urinary (tract) infections: Secondary | ICD-10-CM | POA: Diagnosis not present

## 2015-10-20 DIAGNOSIS — Z8719 Personal history of other diseases of the digestive system: Secondary | ICD-10-CM | POA: Insufficient documentation

## 2015-10-20 DIAGNOSIS — M79601 Pain in right arm: Secondary | ICD-10-CM

## 2015-10-20 DIAGNOSIS — Z8739 Personal history of other diseases of the musculoskeletal system and connective tissue: Secondary | ICD-10-CM | POA: Insufficient documentation

## 2015-10-20 DIAGNOSIS — S59901D Unspecified injury of right elbow, subsequent encounter: Secondary | ICD-10-CM | POA: Insufficient documentation

## 2015-10-20 MED ORDER — OXYCODONE-ACETAMINOPHEN 5-325 MG PO TABS
2.0000 | ORAL_TABLET | Freq: Once | ORAL | Status: AC
  Administered 2015-10-20: 2 via ORAL
  Filled 2015-10-20: qty 2

## 2015-10-20 MED ORDER — OXYCODONE-ACETAMINOPHEN 5-325 MG PO TABS: 1.0000 | ORAL_TABLET | Freq: Four times a day (QID) | ORAL | Status: DC | PRN

## 2015-10-20 NOTE — ED Notes (Signed)
Discharge instructions, follow up care, and rx x1 reviewed with patient. Patient verbalized understanding. 

## 2015-10-20 NOTE — Discharge Instructions (Signed)

## 2015-10-20 NOTE — ED Notes (Signed)
Patient transported to X-ray 

## 2015-10-20 NOTE — ED Notes (Signed)
Pt states that on Friday she fell landing on her L arm. Has been seen twice by PCP. Told to come in here today because she is having numbness and R hand is cold and discolored. Capillary refill <3 sec.  Alert and oriented.

## 2015-10-20 NOTE — ED Provider Notes (Signed)
CSN: [id number]     Arrival date & time 10/20/15  1454 History   First MD Initiated Contact with Patient 10/20/15 1732     Chief Complaint  Patient presents with  . Arm Injury     (Consider location/radiation/quality/duration/timing/severity/associated sxs/prior Treatment) HPI Comments: Patient presents to the emergency department with chief complaint of right arm pain. She states that she fell landing on her right arm last Friday. She states that she was seen by her primary care doctor and diagnosed with a wrist contusion. She states that over the past several days she has had intermittent numbness and discoloration to her right hand. She denies any aggravating or relieving factors. She states that she does have pain with movement of the wrist. She has tried taking hydrocodone with no relief. She states that she is mainly here today because her employer wanted her to return to work, and she does not feel that she can do so. Additionally, she states that she has had more pain at the elbow and the shoulder than she had originally. She denies any other symptoms.  The history is provided by the patient. No language interpreter was used.    Past Medical History  Diagnosis Date  . MIGRAINES, HX OF 06/24/2007  . Gallstones   . IBS (irritable bowel syndrome)     doesn't take any meds  . OCD (obsessive compulsive disorder)     takes Prozac daily  . Anxiety     takes Klonopin nightly  . ASTHMA NOS W/ACUTE EXACERBATION 01/05/2010    exercise induced  . History of migraine     last one a couple of days ago  . Coarse tremors   . Seizures (HCC)     takes Lamictal daily;last seizure was in 11/14/10  . Degenerative disc disease     back  . Back pain   . GERD (gastroesophageal reflux disease)     but doesn't take anything for it  . Urinary frequency   . Medullary sponge kidney 2007    kidneys can be sick but she isnt sick  . PYELONEPHRITIS 06/24/2007  . History of UTI     frequent  . History  of kidney stones   . Anemia     takes Ferrous Sulfate daily  . Narcolepsy   . 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 Back surgery    . Lumbar laminectomy/decompression microdiscectomy  07/24/2012    Procedure: LUMBAR LAMINECTOMY/DECOMPRESSION MICRODISCECTOMY;  Surgeon: Jacki Cones, MD;  Location: [facility];  Service: Orthopedics;  Laterality: Right;  L5-S1  . Cholecystectomy N/A 12/02/2013    Procedure: LAPAROSCOPIC CHOLECYSTECTOMY WITH ATTEMPTED INTRAOPERATIVE CHOLANGIOGRAM;  Surgeon: Atilano Ina, MD;  Location: [facility] OR;  Service: General;  Laterality: N/A;  . Wisdom tooth extraction     Family History  Problem Relation Age of Onset  . Anesthesia problems Neg Hx   . Heart disease Mother   . ADD / ADHD Sister    Social History  Substance Use Topics  . Smoking status: Never Smoker   . Smokeless tobacco: Never Used  . Alcohol Use: No     Comment: occas. when not pregnant   OB History    Gravida Para Term Preterm AB TAB SAB Ectopic Multiple Living   0 0 0 0 0 0 2     Review of Systems  Constitutional: Negative for fever and chills.  Respiratory: Negative for shortness of breath.  Cardiovascular: Negative for chest pain.  Gastrointestinal: Negative for nausea, vomiting, diarrhea and constipation.  Genitourinary: Negative for dysuria.  Musculoskeletal: Positive for arthralgias.  Neurological: Positive for numbness.  All other systems reviewed and are negative.     Allergies  Other; Soy allergy; Sumatriptan; Zolmitriptan; Ciprofloxacin; Nsaids; Topiramate; Triptans; and Adhesive  Home Medications   Prior to Admission medications   Medication Sig Start Date End Date Taking? Authorizing Provider  amphetamine-dextroamphetamine (ADDERALL XR) 30 MG 24 hr capsule Take 1 capsule (30 mg total) by mouth daily. 09/28/15  Yes Butch Penny, NP  amphetamine-dextroamphetamine (ADDERALL) 5 MG tablet Take 1 tablet PO at lunch if  needed. Patient taking differently: Take 5 mg by mouth daily as needed (narcolepsy). T 09/13/15  Yes Butch Penny, NP  HYDROcodone-acetaminophen (NORCO/VICODIN) 5-325 MG tablet Take 1 tablet by mouth every 6 (six) hours as needed for severe pain.  10/15/15  Yes Historical Provider, MD  norgestrel-ethinyl estradiol (LO/OVRAL,CRYSELLE) 0.3-30 MG-MCG tablet Take 1 tablet by mouth at bedtime.    Yes Historical Provider, MD  triamcinolone cream (KENALOG) 0.5 % Apply 1 application topically daily as needed (itchy rashes on knees and elbows.).  11/25/14  Yes Historical Provider, MD   BP 131/84 mmHg  Pulse 88  Temp(Src) 98 F (36.7 C) (Oral)  Resp 16  SpO2 100%  LMP 09/26/2015 (Approximate) Physical Exam  Constitutional: She is oriented to person, place, and time. She appears well-developed and well-nourished.  HENT:  Head: Normocephalic and atraumatic.  Eyes: Conjunctivae and EOM are normal. Pupils are equal, round, and reactive to light.  Neck: Normal range of motion. Neck supple.  Cardiovascular: Normal rate, regular rhythm and intact distal pulses.  Exam reveals no gallop and no friction rub.   No murmur heard. Intact right radial pulse Brisk capillary refill  Pulmonary/Chest: Effort normal and breath sounds normal. No respiratory distress. She has no wheezes. She has no rales. She exhibits no tenderness.  Abdominal: Soft. Bowel sounds are normal. She exhibits no distension and no mass. There is no tenderness. There is no rebound and no guarding.  Musculoskeletal: Normal range of motion. She exhibits no edema or tenderness.  Right wrist range of motion and strength limited secondary to pain, no bony abnormality or deformity, no obvious swelling  Right elbow mildly tender to palpation, range of motion and strength limited secondary to pain, no bony abnormality or deformity  Right shoulder mildly tender to palpation anteriorly and posteriorly, no bony abnormality or deformity, range of motion  and strength limited secondary to pain  Neurological: She is alert and oriented to person, place, and time.  Sensation right upper extremity intact  Skin: Skin is warm and dry.  Right wrist is cool to touch, but not discolored  Psychiatric: She has a normal mood and affect. Her behavior is normal. Judgment and thought content normal.  Nursing note and vitals reviewed.   ED Course  Procedures (including critical care time)  Imaging Review Dg Shoulder Right  10/20/2015  CLINICAL DATA:  Right shoulder pain status post injury. EXAM: RIGHT SHOULDER - 2+ VIEW COMPARISON:  None. FINDINGS: There is no evidence of fracture or dislocation. There is no evidence of arthropathy or other focal bone abnormality. Soft tissues are unremarkable. 5 cm circumscribed round artifact overlying the soft tissues superior to the right shoulder seen on the axillary view is likely due to external artifact. IMPRESSION: No acute fracture or dislocation identified about the right shoulder. Electronically Signed   By: Ulanda Edison.D.  On: 10/20/2015 18:48   Dg Elbow Complete Right  10/20/2015  CLINICAL DATA:  Pain following fall EXAM: RIGHT ELBOW - COMPLETE 3+ VIEW COMPARISON:  None. FINDINGS: Frontal, lateral, and bilateral oblique views were obtained. There is no fracture or dislocation. No joint effusion. Joint spaces appear intact. No erosive change. IMPRESSION: No fracture or dislocation.  No appreciable arthropathy. Electronically Signed   By: Bretta Bang III M.D.   On: 10/20/2015 18:48   Dg Wrist Complete Right  10/20/2015  CLINICAL DATA:  Pain following fall EXAM: RIGHT WRIST - COMPLETE 3+ VIEW COMPARISON:  None. FINDINGS: Frontal, oblique, lateral, and ulnar deviation scaphoid images were obtained. Remodeling along the medial proximal aspect of the fifth metacarpal appears indicative of residua of old trauma. There is no demonstrable acute fracture or dislocation. Joint spaces appear intact. No erosive  change. IMPRESSION: Evidence of old trauma involving the fifth metacarpal with remodeling. No acute fracture or dislocation. No apparent arthropathic change. Electronically Signed   By: Bretta Bang III M.D.   On: 10/20/2015 18:49   I have personally reviewed and evaluated these images and lab results as part of my medical decision-making.   MDM   Final diagnoses:  Right arm pain    Patient with right arm pain. She reports color change in her hand and numbness sensation. This is not present on exam. She has intact distal pulses and brisk capillary refill. I see no evidence of neurovascular compromise. Plain films of right shoulder, right elbow, and right wrist are negative. Patient seen by and discussed with Dr. Adriana Simas, who agrees with plan for orthopedic follow-up. Will give the patient some pain medicine, and a wrist splint and sling. Patient understands and agrees with the plan. She is stable and ready for discharge.   Roxy Horseman, PA-C 10/20/15 1929  Donnetta Hutching, MD 10/21/15 1726

## 2015-10-20 NOTE — ED Notes (Signed)
Ortho tech notified of arm sling and wrist splint ordered for this patient.

## 2015-11-01 ENCOUNTER — Telehealth: Payer: Self-pay | Admitting: Neurology

## 2015-11-01 ENCOUNTER — Other Ambulatory Visit: Payer: Self-pay

## 2015-11-01 ENCOUNTER — Telehealth: Payer: Self-pay

## 2015-11-01 MED ORDER — AMPHETAMINE-DEXTROAMPHET ER 30 MG PO CP24: 30.0000 mg | ORAL_CAPSULE | Freq: Every day | ORAL | Status: DC

## 2015-11-01 NOTE — Telephone Encounter (Signed)
Spoke to pt and advised her that her RX for adderall is ready for pick up at the front desk. Pt verbalized understanding.  

## 2015-11-01 NOTE — Telephone Encounter (Signed)
Pt needs refill on amphetamine-dextroamphetamine (ADDERALL XR) 30 MG 24 hr capsule. Thank you °

## 2015-11-01 NOTE — Telephone Encounter (Signed)
Encounter was already closed when it was sent.  Separate encounter opened for refill request, which has been sent to provider for review/approval.  

## 2015-12-03 ENCOUNTER — Telehealth: Payer: Self-pay | Admitting: Neurology

## 2015-12-03 NOTE — Telephone Encounter (Signed)
This is not my patient. Megan and Dr Dohmier have seen him in past

## 2015-12-03 NOTE — Telephone Encounter (Signed)
Patient is calling to get a written Rx for amphetamine-dextroamphetamine (ADDERALL XR) 30 MG 24 hr capsule. I advised the Rx will be ready Monday unless the nurse advises otherwise.

## 2015-12-06 MED ORDER — AMPHETAMINE-DEXTROAMPHET ER 30 MG PO CP24: 30.0000 mg | ORAL_CAPSULE | Freq: Every day | ORAL | Status: DC

## 2015-12-06 MED ORDER — AMPHETAMINE-DEXTROAMPHETAMINE 5 MG PO TABS: ORAL_TABLET | ORAL | Status: DC

## 2015-12-06 NOTE — Telephone Encounter (Signed)
Prescription printed/signed and given to RN.

## 2015-12-06 NOTE — Telephone Encounter (Signed)
RX ready for pick up at front desk

## 2015-12-06 NOTE — Addendum Note (Signed)
Addended by: Enedina FinnerMILLIKAN, Icie Kuznicki P on: 12/06/2015 10:48 AM   Modules accepted: Orders

## 2015-12-07 ENCOUNTER — Telehealth: Payer: Self-pay | Admitting: Neurology

## 2015-12-07 NOTE — Telephone Encounter (Signed)
Patient stopped by to pick up her Rx. She wanted Dr. Vickey Hugerohmeier and Aundra MilletMegan to know that she recently had an MRI which showed she has spinal stenosis and a bone spur C4-5 left side and bone spur right side C5-6. She stated to call her if you want more detailed info. Best contact is [phone]

## 2015-12-07 NOTE — Telephone Encounter (Signed)
Spoke to pt. She advised me that her hurt her neck at work and is involved in a worker's comp case. Her orthopedist ordered an MRI spine which revealed spinal stenosis and bone spurs on c4-5 and c5-6. She just wanted us to be aware of this and if there is anything that [facility] needs to know/do regarding this to let her know. I advised her that we do not handle worker's comp cases. I advised her that I would pass this message along to [facility], NP, who the pt last saw, and if our office needs or recommends anything further, we will call her. She does report that she is seeing a neurosurgeon on Friday.

## 2016-01-10 ENCOUNTER — Ambulatory Visit (INDEPENDENT_AMBULATORY_CARE_PROVIDER_SITE_OTHER): Payer: Managed Care, Other (non HMO) | Admitting: Neurology

## 2016-01-10 ENCOUNTER — Encounter: Payer: Self-pay | Admitting: Neurology

## 2016-01-10 VITALS — BP 142/96 | HR 88 | Resp 20 | Ht 61.0 in | Wt 167.0 lb

## 2016-01-10 DIAGNOSIS — G4710 Hypersomnia, unspecified: Secondary | ICD-10-CM | POA: Diagnosis not present

## 2016-01-10 MED ORDER — AMPHETAMINE-DEXTROAMPHET ER 30 MG PO CP24: 30.0000 mg | ORAL_CAPSULE | Freq: Every day | ORAL | Status: DC

## 2016-01-10 NOTE — Progress Notes (Signed)
PATIENT: [name] DOB: 1986/01/27  REASON FOR VISIT: follow up-  Seizures, narcolepsy HISTORY FROM: patient  HISTORY OF PRESENT ILLNESS:  [name] is a 30 year old female with a history of seizures and narcolepsy.  She returns 01-10-16  for follow-up. The patient continues to take Adderall extended release 30 mg daily. She reports that initially this was working very well. She states that for the last month she's noticed that she has began to be more sleepy in the afternoons.  The patient was also on Lamictal for seizures. She states that she is no longer taking the Lamictal.  She states that she has not had a seizure in over 18 month . She states that in the past she's had 2 grand mal seizures.  She feels that since she's been taking Adderall this is made her feel overall better. She does report increasing fatigue and endorsed the Epworth sleepiness score at 13 points and the fatigue severity score at 47 points, both elevated.    HISTORY 08/25/14: [name] is a 30 year old female with a history of seizures and narcolepsy. She returns today for follow-up. The patient is currently [redacted] weeks pregnant. She was taken off her medication for narcolepsy until after the first trimester. She continues to take Lamictal for seizures. Patient is very tearful during the visit. She states that she is very depressed since coming off her adderall. She states that she is very tired throughout the day and her husband has even noticed a changed in her mood. She states that she has even had thoughts of "getting rid of the baby" so she can feel normal. She denies any seizure events and is tolerating Lamictal fine. Other than the pregnancy,no new medical history since last seen.   HISTORY 06/29/14: [name] is a 30 year old female with a history of seizures and narcolepsy. She returns today for follow-up. She is currently taking Lamictal 300 mg daily for seizures and Provigil 200 mg for daytime sleepiness. She  reports that she continues to feel fatigued and sleepy throughout the day. Her Epworth is 20 and Fatigue Severity score is 60. She feels that the Provigil offers some relief but would like to try something else.She sleeps 6-7 hours a night. Goes to bed between 11:00 pm- 12:00 am and arises at 6:30 am. She does have trouble falling asleep and therefore some nights she will only get 3-4 hours of sleep. She does use Klonopin to help her fall asleep. She reports that Lamictal has controlled her seizures. Her last seizure was in April.     REVIEW OF SYSTEMS: Out of a complete 14 system review of symptoms, the patient complains only of the following symptoms, and all other reviewed systems are negative.   frequent waking, daytime sleepiness, How likely are you to doze in the following situations: 0 = not likely, 1 = slight chance, 2 = moderate chance, 3 = high chance  Sitting and Reading?2 Watching Television?3 Sitting inactive in a public place (theater or meeting)?2 Lying down in the afternoon when circumstances permit?2 Sitting and talking to someone?0 Sitting quietly after lunch without alcohol?1 In a car, while stopped for a few minutes in traffic?0 As a passenger in a car for an hour without a break?3  Total = 13   ALLERGIES: HOME MEDICATIONS: Outpatient Prescriptions Prior to Visit  Medication Sig Dispense Refill  . amphetamine-dextroamphetamine (ADDERALL XR) 30 MG 24 hr capsule Take 1 capsule (30 mg total) by mouth daily. 30 capsule  0  . amphetamine-dextroamphetamine (ADDERALL) 5 MG tablet Take 1 tablet PO at lunch if needed. 30 tablet 0  . norgestrel-ethinyl estradiol (LO/OVRAL,CRYSELLE) 0.3-30 MG-MCG tablet Take 1 tablet by mouth at bedtime.     . triamcinolone cream (KENALOG) 0.5 % Apply 1 application topically daily as needed (itchy rashes on knees and elbows.).     Marland Kitchen. oxyCODONE-acetaminophen (PERCOCET/ROXICET) 5-325 MG tablet Take 1-2 tablets by mouth every 6 (six) hours as needed  for severe pain. 12 tablet 0   No facility-administered medications prior to visit.    PAST MEDICAL HISTORY: Past Medical History  Diagnosis Date  . MIGRAINES, HX OF 06/24/2007  . Gallstones   . IBS (irritable bowel syndrome)     doesn't take any meds  . OCD (obsessive compulsive disorder)     takes Prozac daily  . Anxiety     takes Klonopin nightly  . ASTHMA NOS W/ACUTE EXACERBATION 01/05/2010    exercise induced  . History of migraine     last one a couple of days ago  . Coarse tremors   . Seizures (HCC)     takes Lamictal daily;last seizure was in 11/14/10  . Degenerative disc disease     back  . Back pain   . GERD (gastroesophageal reflux disease)     but doesn't take anything for it  . Urinary frequency   . Medullary sponge kidney 2007    kidneys can be sick but she isnt sick  . PYELONEPHRITIS 06/24/2007  . History of UTI     frequent  . History of kidney stones   . Anemia     takes Ferrous Sulfate daily  . Narcolepsy   . 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 Back surgery    . Lumbar laminectomy/decompression microdiscectomy  07/24/2012    Procedure: LUMBAR LAMINECTOMY/DECOMPRESSION MICRODISCECTOMY;  Surgeon: Jacki Conesonald A Gioffre, MD;  Location: [facility];  Service: Orthopedics;  Laterality: Right;  L5-S1  . Cholecystectomy N/A 12/02/2013    Procedure: LAPAROSCOPIC CHOLECYSTECTOMY WITH ATTEMPTED INTRAOPERATIVE CHOLANGIOGRAM;  Surgeon: Atilano InaEric M Wilson, MD;  [facility];  Service: General;  Laterality: N/A;  . Wisdom tooth extraction      FAMILY HISTORY: Family History  Problem Relation Age of Onset  . Anesthesia problems Neg Hx   . Heart disease Mother   . ADD / ADHD Sister     SOCIAL HISTORY: Social History   Social History  . Marital Status: Married    Spouse Name: N/A  . Number of Children: 2  . Years of Education: 14   Occupational History  . 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 Smoking status: Never Smoker   . Smokeless tobacco: Never Used  . Alcohol Use: No     Comment: occas. when not pregnant  . Drug Use: No  . Sexual Activity:    Partners: Male     Comment: last sex  Aug 22 2014   Other Topics Concern  . Not on file   Social History Narrative   HSG, finished 2 years of college. single mom with 2 sons blake - Aug '09, Elijah Dec '12. 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 Still lives with father of her sons with wedding plans on hold ( Jan '13)     PHYSICAL EXAM  Filed Vitals:   01/10/16  1532  BP: 142/96  Pulse: 88  Resp: 20  Height:  (1.549 m)  Weight: 167 lb (75.751 kg)   Body mass index is 31.57 kg/(m^2).  Generalized: Well developed, in no acute distress ,  neck 15 inches, Mallampati 3 .   Neurological examination  Mentation: Alert oriented to time, place, history taking. Follows all commands speech and language fluent Cranial nerve, no change in sense of smell or taste,  Pupils were equal round reactive to light. Extraocular movements were full, visual field were full on confrontational test. Facial sensation and strength were normal. Uvula tongue midline. Head turning and shoulder shrug  were normal and symmetric. Motor: The motor testing reveals 5 over 5 strength of all 4 extremities. Good symmetric motor tone is noted throughout.  Sensory: Sensory testing is intact to soft touch on all 4 extremities. No evidence of extinction is noted.  Coordination: Cerebellar testing reveals good finger-nose-finger and heel-to-shin bilaterally.  Gait and station: Gait is normal. Tandem gait is normal. Romberg is negative. No drift is seen.  Reflexes: Deep tendon reflexes are symmetric and normal bilaterally.   DIAGNOSTIC DATA (LABS, IMAGING, TESTING) - I reviewed patient records, labs, notes, testing and imaging myself where available. Labs reviewed.   ASSESSMENT AND PLAN 30  y.o. year old , seen in a 25 minute RV, married  female, mother of 2, presenting with :   has a past medical history of MIGRAINES, HX OF (06/24/2007); Gallstones; IBS (irritable bowel syndrome); OCD (obsessive compulsive disorder); Anxiety; ASTHMA NOS W/ACUTE EXACERBATION (01/05/2010); History of migraine; Coarse tremors; Seizures (HCC); Degenerative disc disease; Back pain; GERD (gastroesophageal reflux disease); Urinary frequency; Medullary sponge kidney (2007); PYELONEPHRITIS (06/24/2007); History of UTI; History of kidney stones; Anemia; Narcolepsy; and Narcolepsy (05/18/2014). here with:   1. Excessive daytime fatigue with an Epworth sleepiness score of 13 points, fatigue severity score 47 points.    Overall the patient is doing well. She does report that she is  beginning to be more sleepy in the afternoons.  She will continue taking Adderall extended release 30 mg daily. I did no refill Adderall immediate release 5 mg to take it lunch as needed to help with afternoon sleepiness. She states that it may interfere with her nocturnal sleep and her lunchtime is very variable.  Patient verbalized understanding.   Seizure; At this time the patient is adamant that she does not want to be on any other  medication. She will follow-up in 6 months with Np, alternating with me , Dr. Vickey Huger.     Safira Proffit, MD  01/10/2016, 4:06 PM [facility] [address], [address] [place], [place] [address] [phone]

## 2016-01-10 NOTE — Patient Instructions (Signed)
Hypersomnia Hypersomnia is when you feel extremely tired during the day even though you're getting plenty of sleep at night. You may need to take naps during the day, and you may also be extremely difficult to wake up when you are sleeping.  CAUSES  The cause of your hypersomnia may not be known. Hypersomnia may be caused by:   Medicines.  Sleep disorders, such as narcolepsy.  Trauma or injury to your head or nervous system.  Using drugs or alcohol.  Tumors.  Medical conditions, such as depression or hypothyroidism.  Genetics. SIGNS AND SYMPTOMS  The main symptoms of hypersomnia include:   Feeling extremely tired throughout the day.  Being very difficult to wake up.  Sleeping for longer and longer periods.  Taking naps throughout the day. Other symptoms may include:   Feeling:  Restless.  Annoyed.  Anxious.  Low energy.  Having difficulty:  Remembering.  Speaking.  Thinking.  Losing your appetite.  Experiencing hallucinations. DIAGNOSIS  Hypersomnia may be diagnosed by:  Medical history and physical exam. This will include a sleep history.  Completing sleep logs.  Tests may also be done, such as:  Polysomnography.  Multiple sleep latency test (MSLT). TREATMENT  There is no cure for hypersomnia, but treatment can be very effective in helping manage the condition. Treatment may include:  Lifestyle and sleeping strategies to help cope with the condition.  Stimulant medicines.  Treating any underlying causes of hypersomnia. HOME CARE INSTRUCTIONS  Take medicines only as directed by your health care provider.  Schedule short naps for when you feel sleepiest during the day. Tell your employer or teachers that you have hypersomnia. You may be able to adjust your schedule to include time for naps.  Avoid drinking alcohol or caffeinated beverages.  Do not eat a heavy meal before bedtime. Eat at about the same times every day.  Do not drive or  operate heavy machinery if you are sleepy.  Do not swim or go out on the water without a life jacket.  If possible, adjust your schedule so that you do not have to work or be active at night.  Keep all follow-up visits as directed by your health care provider. This is important. SEEK MEDICAL CARE IF:   You have new symptoms.  Your symptoms get worse. SEEK IMMEDIATE MEDICAL CARE IF:  You have serious thoughts of hurting yourself or someone else.   This information is not intended to replace advice given to you by your health care provider. Make sure you discuss any questions you have with your health care provider.   Document Released: 09/01/2002 Document Revised: 10/02/2014 Document Reviewed: 04/16/2014 Elsevier Interactive Patient Education 2016 [employer]vier Inc.  

## 2016-02-14 ENCOUNTER — Other Ambulatory Visit: Payer: Self-pay | Admitting: Neurology

## 2016-02-14 DIAGNOSIS — G4710 Hypersomnia, unspecified: Secondary | ICD-10-CM

## 2016-02-14 NOTE — Telephone Encounter (Signed)
Patient requesting refill of amphetamine-dextroamphetamine (ADDERALL XR) 30 MG 24 hr capsule Pharmacy: [facility] [address]

## 2016-02-15 MED ORDER — AMPHETAMINE-DEXTROAMPHET ER 30 MG PO CP24: 30.0000 mg | ORAL_CAPSULE | Freq: Every day | ORAL | Status: DC

## 2016-02-15 NOTE — Telephone Encounter (Signed)
I called pt to advise her that her RX for adderall is ready for pick up at the front desk.  No answer, left a message asking her to call back. If pt calls back, please advise her of this information and of clinic hours.

## 2016-03-16 ENCOUNTER — Other Ambulatory Visit: Payer: Self-pay | Admitting: Orthopedic Surgery

## 2016-03-16 ENCOUNTER — Other Ambulatory Visit: Payer: Self-pay | Admitting: Neurology

## 2016-03-16 DIAGNOSIS — G4710 Hypersomnia, unspecified: Secondary | ICD-10-CM

## 2016-03-16 DIAGNOSIS — M5412 Radiculopathy, cervical region: Secondary | ICD-10-CM

## 2016-03-16 MED ORDER — AMPHETAMINE-DEXTROAMPHET ER 30 MG PO CP24: 30.0000 mg | ORAL_CAPSULE | Freq: Every day | ORAL | Status: DC

## 2016-03-16 NOTE — Telephone Encounter (Signed)
I spoke to pt and advised her that her RX for adderall is ready for pick up at the front desk. Pt verbalized understanding.  

## 2016-03-16 NOTE — Telephone Encounter (Signed)
Patient is calling to get a written Rx amphetamine-dextroamphetamine(ADDERALL) 30 mg 24 hr capsule.  Thanks!

## 2016-04-03 ENCOUNTER — Ambulatory Visit
Admission: RE | Admit: 2016-04-03 | Discharge: 2016-04-03 | Disposition: A | Discharge status: Home / Self Care | Payer: Worker's Compensation | Source: Ambulatory Visit | Attending: Orthopedic Surgery | Admitting: Orthopedic Surgery

## 2016-04-03 DIAGNOSIS — M5412 Radiculopathy, cervical region: Secondary | ICD-10-CM

## 2016-04-03 MED ORDER — IOPAMIDOL (ISOVUE-M 300) INJECTION 61%
1.0000 mL | Freq: Once | INTRAMUSCULAR | Status: AC | PRN
  Administered 2016-04-03: 1 mL via EPIDURAL

## 2016-04-03 MED ORDER — TRIAMCINOLONE ACETONIDE 40 MG/ML IJ SUSP (RADIOLOGY)
60.0000 mg | Freq: Once | INTRAMUSCULAR | Status: AC
  Administered 2016-04-03: 60 mg via EPIDURAL

## 2016-04-03 NOTE — Discharge Instructions (Signed)

## 2016-04-20 ENCOUNTER — Other Ambulatory Visit: Payer: Self-pay | Admitting: Neurology

## 2016-04-20 DIAGNOSIS — G4710 Hypersomnia, unspecified: Secondary | ICD-10-CM

## 2016-04-20 MED ORDER — AMPHETAMINE-DEXTROAMPHET ER 30 MG PO CP24: 30.0000 mg | ORAL_CAPSULE | Freq: Every day | ORAL | 0 refills | Status: DC

## 2016-04-20 NOTE — Telephone Encounter (Signed)
I called pt to advise her that her RX for adderall is ready for pick up at the front desk.   A female answered and advised me that pt is unavailable. I did not leave a message with him, just advised him that I would try again later.  If pt calls back, please advise her that her RX is ready for pick up.

## 2016-04-20 NOTE — Telephone Encounter (Signed)
Patient called to request refill of amphetamine-dextroamphetamine (ADDERALL XR) 30 MG 24 hr capsule °

## 2016-05-31 ENCOUNTER — Other Ambulatory Visit: Payer: Self-pay | Admitting: Adult Health

## 2016-05-31 DIAGNOSIS — G4710 Hypersomnia, unspecified: Secondary | ICD-10-CM

## 2016-05-31 MED ORDER — AMPHETAMINE-DEXTROAMPHET ER 30 MG PO CP24: 30.0000 mg | ORAL_CAPSULE | Freq: Every day | ORAL | 0 refills | Status: DC

## 2016-05-31 NOTE — Telephone Encounter (Signed)
Patient called to request refill of amphetamine-dextroamphetamine (ADDERALL XR) 30 MG 24 hr capsule °

## 2016-05-31 NOTE — Telephone Encounter (Signed)
LM that prescription is ready to p/u. No details given.

## 2016-05-31 NOTE — Telephone Encounter (Signed)
Dr. Vickey Hugerohmeier out of office.   Sent to MM/NP who has seen pt.

## 2016-06-05 ENCOUNTER — Ambulatory Visit: Payer: Managed Care, Other (non HMO) | Admitting: Adult Health

## 2016-06-05 ENCOUNTER — Encounter: Payer: Self-pay | Admitting: *Deleted

## 2016-06-05 ENCOUNTER — Telehealth: Payer: Self-pay

## 2016-06-05 NOTE — Telephone Encounter (Signed)
Patient cancelled appt same day  

## 2016-06-05 NOTE — Progress Notes (Signed)
I [facility]iam [facility].  She stated that pharmacy can run as DAW-9 and should go thru.  (BN at generic price).  I called and [facility] Winn[facility]Amanda [facility] and they were able to run as DAW-9 and it went thru.  PA was not needed for this pt.

## 2016-06-06 ENCOUNTER — Encounter: Payer: Self-pay | Admitting: Adult Health

## 2016-07-06 ENCOUNTER — Other Ambulatory Visit: Payer: Self-pay | Admitting: Adult Health

## 2016-07-06 DIAGNOSIS — G4710 Hypersomnia, unspecified: Secondary | ICD-10-CM

## 2016-07-06 MED ORDER — AMPHETAMINE-DEXTROAMPHET ER 30 MG PO CP24: 30.0000 mg | ORAL_CAPSULE | Freq: Every day | ORAL | 0 refills | Status: DC

## 2016-07-06 NOTE — Telephone Encounter (Signed)
Patient called to request refill of amphetamine-dextroamphetamine (ADDERALL XR) 30 MG 24 hr capsule °

## 2016-07-06 NOTE — Telephone Encounter (Signed)
I called pt to advise her that her RX for adderall is ready for pick up at the front desk. No answer, left a message asking her to call me back. If pt calls back, please advise her of this information. 

## 2016-07-28 ENCOUNTER — Emergency Department (HOSPITAL_COMMUNITY): Payer: Worker's Compensation

## 2016-07-28 ENCOUNTER — Emergency Department (HOSPITAL_COMMUNITY)
Admission: EM | Admit: 2016-07-28 | Discharge: 2016-07-29 | Disposition: A | Discharge status: Home / Self Care | Payer: Worker's Compensation | Attending: Emergency Medicine | Admitting: Emergency Medicine

## 2016-07-28 ENCOUNTER — Encounter (HOSPITAL_COMMUNITY): Payer: Self-pay | Admitting: *Deleted

## 2016-07-28 DIAGNOSIS — T1490XA Injury, unspecified, initial encounter: Secondary | ICD-10-CM

## 2016-07-28 DIAGNOSIS — R202 Paresthesia of skin: Secondary | ICD-10-CM | POA: Insufficient documentation

## 2016-07-28 DIAGNOSIS — J45909 Unspecified asthma, uncomplicated: Secondary | ICD-10-CM | POA: Insufficient documentation

## 2016-07-28 LAB — COMPREHENSIVE METABOLIC PANEL
ALT: 17 U/L (ref 14–54)
AST: 18 U/L (ref 15–41)
Albumin: 3.4 g/dL — ABNORMAL LOW (ref 3.5–5.0)
Alkaline Phosphatase: 71 U/L (ref 38–126)
Anion gap: 9 (ref 5–15)
BUN: 7 mg/dL (ref 6–20)
CHLORIDE: 106 mmol/L (ref 101–111)
CO2: 23 mmol/L (ref 22–32)
CREATININE: 0.71 mg/dL (ref 0.44–1.00)
Calcium: 9.1 mg/dL (ref 8.9–10.3)
GFR calc non Af Amer: >60 mL/min (ref >60)
Glucose, Bld: 93 mg/dL (ref 65–99)
POTASSIUM: 3.6 mmol/L (ref 3.5–5.1)
SODIUM: 138 mmol/L (ref 135–145)
Total Bilirubin: 0.4 mg/dL (ref 0.3–1.2)
Total Protein: 7.5 g/dL (ref 6.5–8.1)

## 2016-07-28 LAB — URINALYSIS, ROUTINE W REFLEX MICROSCOPIC
BILIRUBIN URINE: NEGATIVE
Glucose, UA: NEGATIVE mg/dL
Hgb urine dipstick: NEGATIVE
Ketones, ur: NEGATIVE mg/dL
NITRITE: NEGATIVE
PH: 7 (ref 5.0–8.0)
Protein, ur: NEGATIVE mg/dL
SPECIFIC GRAVITY, URINE: 1.011 (ref 1.005–1.030)

## 2016-07-28 LAB — CBC WITH DIFFERENTIAL/PLATELET
BASOS ABS: 0 10*3/uL (ref 0.0–0.1)
BASOS PCT: 0 %
EOS ABS: 0.1 10*3/uL (ref 0.0–0.7)
EOS PCT: 1 %
HCT: 39.5 % (ref 36.0–46.0)
Hemoglobin: 13.1 g/dL (ref 12.0–15.0)
Lymphocytes Relative: 22 %
Lymphs Abs: 1.5 10*3/uL (ref 0.7–4.0)
MCH: 28.7 pg (ref 26.0–34.0)
MCHC: 33.2 g/dL (ref 30.0–36.0)
MCV: 86.6 fL (ref 78.0–100.0)
Monocytes Absolute: 0.3 10*3/uL (ref 0.1–1.0)
Monocytes Relative: 5 %
Neutro Abs: 4.8 10*3/uL (ref 1.7–7.7)
Neutrophils Relative %: 72 %
Platelets: 316 10*3/uL (ref 150–400)
RBC: 4.56 MIL/uL (ref 3.87–5.11)
RDW: 12.3 % (ref 11.5–15.5)
WBC: 6.7 10*3/uL (ref 4.0–10.5)

## 2016-07-28 LAB — URINE MICROSCOPIC-ADD ON

## 2016-07-28 LAB — PREGNANCY, URINE: Preg Test, Ur: NEGATIVE

## 2016-07-28 MED ORDER — LORAZEPAM 2 MG/ML IJ SOLN
1.0000 mg | Freq: Once | INTRAMUSCULAR | Status: AC
Start: 2016-07-28 — End: 2016-07-28
  Administered 2016-07-28: 1 mg via INTRAVENOUS
  Filled 2016-07-28: qty 1

## 2016-07-28 MED ORDER — LORAZEPAM 2 MG/ML IJ SOLN
2.0000 mg | Freq: Once | INTRAMUSCULAR | Status: AC
  Administered 2016-07-28: 2 mg via INTRAVENOUS
  Filled 2016-07-28: qty 1

## 2016-07-28 MED ORDER — GADOBENATE DIMEGLUMINE 529 MG/ML IV SOLN
20.0000 mL | Freq: Once | INTRAVENOUS | Status: AC | PRN
  Administered 2016-07-28: 20 mL via INTRAVENOUS

## 2016-07-28 NOTE — ED Notes (Signed)
Patient in MRI 

## 2016-07-28 NOTE — ED Notes (Signed)
This RN walked to MRI to give patient 2mg  ativan IV so patient can tolerate MRI.

## 2016-07-28 NOTE — ED Notes (Signed)
Patient to CT.

## 2016-07-28 NOTE — ED Notes (Signed)
Patient went to CT

## 2016-07-28 NOTE — ED Triage Notes (Signed)
Pt brought in EMS from physical therapy.  Pt in PT for right arm injury that has numbness and tingling at baseline.  Pt reports she was lifting a weight at PT when numbness and tingling shot up into right side of face and right eye began to twitch.

## 2016-07-28 NOTE — ED Provider Notes (Signed)
[facility] DEPT Provider Note   CSN: [id number] Arrival date & time: 07/28/16  1213     History   Chief Complaint Chief Complaint  Patient presents with  . Numbness    HPI [name] is a 30 y.o. female.  The history is provided by the patient. No language interpreter was used.  Neurologic Problem  This is a new problem. The current episode started 1 to 2 hours ago. The problem occurs constantly. Nothing aggravates the symptoms. Nothing relieves the symptoms. She has tried nothing for the symptoms.  Pt reports Pt reports she was at Physical Therapy and whe was lifting a weight with her right arm.  Pt reports weight was heavy.  Pt reports she dropped the weight and she had numbness and tingling shoot up to her face and right eye.  Pt reports right eye began twitching.  Pt reports she could not open her right eye.  Pt reports now left side is twitching and she can not open her left eye.  Pt reports she can not move her right arm.   Pt reports she saw her MD today because she has high blood pressure and an elevated heart rate.  Pt reports she fell in January and has had problems since.  Pt reports she has been diagnosed with regional pain syndrome and thoracic outlet syndrome.  Pt is followed by Neurology and Pain management in [place].    Past Medical History:  Diagnosis Date  . Anemia    takes Ferrous Sulfate daily  . Anxiety    takes Klonopin nightly  . ASTHMA NOS W/ACUTE EXACERBATION 01/05/2010   exercise induced  . Back pain   . Coarse tremors   . Degenerative disc disease    back  . Gallstones   . GERD (gastroesophageal reflux disease)    but doesn't take anything for it  . History of kidney stones   . History of migraine    last one a couple of days ago  . History of UTI    frequent  . IBS (irritable bowel syndrome)    doesn't take any meds  . Medullary sponge kidney 2007   kidneys can be sick but she isnt sick  . MIGRAINES, HX OF 06/24/2007  . Narcolepsy   .  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 OCD (obsessive compulsive disorder)    takes Prozac daily  . PYELONEPHRITIS 06/24/2007  . Seizures (HCC)    takes Lamictal daily;last seizure was in 11/14/10  . Urinary frequency     Patient Active Problem List   Diagnosis Date Noted  . 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 S/P laparoscopic cholecystectomy 12/02/2013  . 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  . H/O abnormal cervical Papanicolaou smear 09/29/2013  . OCD (obsessive compulsive disorder)   . Convulsions/seizures (HCC) 04/21/2013  . Anxiety 04/21/2013  . 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 Chronic cough 10/17/2011  . 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 ASTHMA NOS W/ACUTE EXACERBATION 01/05/2010  . ABSENCE OF MENSTRUATION 10/27/2009  . MIGRAINES, HX OF 06/24/2007    Past Surgical History:  Procedure Laterality Date  . BACK SURGERY    . CHOLECYSTECTOMY N/A 12/02/2013   Procedure: LAPAROSCOPIC CHOLECYSTECTOMY WITH ATTEMPTED INTRAOPERATIVE CHOLANGIOGRAM;  Surgeon: Atilano InaEric M Wilson, MD;  [facility];  Service: General;  Laterality: N/A;  . LUMBAR LAMINECTOMY/DECOMPRESSION MICRODISCECTOMY  07/24/2012   Procedure: LUMBAR LAMINECTOMY/DECOMPRESSION MICRODISCECTOMY;  Surgeon: Jacki Conesonald A Gioffre, MD;  Location: [facility];  Service: Orthopedics;  Laterality: Right;  L5-S1  .  WISDOM TOOTH EXTRACTION      OB History    Gravida Para Term Preterm AB Living   3 2 2  0 0 2   SAB TAB Ectopic Multiple Live Births   0 0 0 0 2       Home Medications    Prior to Admission medications   Medication Sig Start Date End Date Taking? Authorizing Provider  amitriptyline (ELAVIL) 10 MG tablet Take 10 mg by mouth at bedtime.   Yes Historical Provider, MD  amphetamine-dextroamphetamine (ADDERALL XR) 30 MG 24 hr capsule Take 1 capsule (30 mg total) by mouth daily. 07/06/16  Yes Carmen Dohmeier, MD  amphetamine-dextroamphetamine (ADDERALL) 5 MG tablet Take 1 tablet PO at lunch if needed. Patient taking  differently: Take 5 mg by mouth every evening. Take in afternoon if needed 12/06/15  Yes Butch Penny, NP  cyclobenzaprine (FLEXERIL) 10 MG tablet Take 10 mg by mouth at bedtime as needed for muscle spasms.   Yes Historical Provider, MD  gabapentin (NEURONTIN) 300 MG capsule Take 300-1,200 mg by mouth 2 (two) times daily. 300 mg in the morning and 1200 mg at bedtime   Yes Historical Provider, MD  norgestrel-ethinyl estradiol (LO/OVRAL,CRYSELLE) 0.3-30 MG-MCG tablet Take 1 tablet by mouth at bedtime.    Yes Historical Provider, MD  traMADol (ULTRAM) 50 MG tablet Take 50 mg by mouth daily as needed for moderate pain.  12/17/15  Yes Historical Provider, MD  triamcinolone cream (KENALOG) 0.5 % Apply 1 application topically daily as needed (itchy rashes on knees and elbows.).  11/25/14  Yes Historical Provider, MD  methocarbamol (ROBAXIN) 500 MG tablet TAKE 1 TABLET BY MOUTH TWICE A DAY AS NEEDED FOR SPASMS DURING DAYTIME 12/15/15   Historical Provider, MD    Family History Family History  Problem Relation Age of Onset  . Heart disease Mother   . ADD / ADHD Sister   . Anesthesia problems Neg Hx     Social History Social History  Substance Use Topics  . Smoking status: Never Smoker  . Smokeless tobacco: Never Used  . Alcohol use No     Comment: occas. when not pregnant     Allergies   Other; Soy allergy; Sumatriptan; Zolmitriptan; Ciprofloxacin; Nsaids; Topiramate; Triptans; and Adhesive [tape]   Review of Systems Review of Systems  Skin: Negative for color change.  Neurological: Positive for numbness.     Physical Exam Updated Vital Signs BP (!) 150/107   Pulse 115   Temp 98.7 F (37.1 C) (Oral)   Resp 12   Ht 5\' 1"  (1.549 m)   Wt 89.8 kg   SpO2 100%   BMI 37.41 kg/m   Physical Exam  Constitutional: She appears well-developed and well-nourished.  HENT:  Head: Normocephalic.  Right Ear: External ear normal.  Left Ear: External ear normal.  Lifting eyebrows, grimacing,   Squeezing eyelids tight.  Rolling eyes,    Eyes: EOM are normal. Pupils are equal, round, and reactive to light.  Neck:  Pt reports unable to move neck   Cardiovascular:  tachycardia  Pulmonary/Chest: Effort normal and breath sounds normal.  Neurological: She is alert.  Pt unable to comply with testing  Skin: Skin is warm. Capillary refill takes less than 2 seconds.  Nursing note and vitals reviewed.    ED Treatments / Results  Labs (all labs ordered are listed, but only abnormal results are displayed) Labs Reviewed  CBC WITH DIFFERENTIAL/PLATELET  COMPREHENSIVE METABOLIC PANEL  URINALYSIS, ROUTINE W REFLEX MICROSCOPIC (NOT  AT [facility])  PREGNANCY, URINE    EKG  EKG Interpretation None       Radiology No results found.  Procedures Procedures (including critical care time)  Medications Ordered in ED Medications  LORazepam (ATIVAN) injection 1 mg (not administered)     Initial Impression / Assessment and Plan / ED Course  I have reviewed the triage vital signs and the nursing notes.  Pertinent labs & imaging results that were available during my care of the patient were reviewed by me and considered in my medical decision making (see chart for details).  Clinical Course    Pt has no pattern to neurologic exam.  Pt given ativan and facial twitching/spasm resolved. Pt still unable to move right arm. I spoke to Neurology on call.  He advised MRi cspine/neck to make sure pt does not have a brachial plexius injury.    Pt turned over to Will Dansie PA-C   Final Clinical Impressions(s) / ED Diagnoses   Final diagnoses:  None    New Prescriptions New Prescriptions   No medications on file     Elson AreasLeslie K Sofia, PA-C 07/28/16 1601    Tilden FossaElizabeth Rees, MD 07/29/16 [phone]

## 2016-07-29 NOTE — ED Provider Notes (Signed)
[facility] at shift change. Please see her note for further. Briefly patient presented with facial twitching and inability to move her right arm. Patient had normal head CT.  Plan at shift change was to obtain an MRI of her neck and brachial plexus and to consult neurology when this is completed. MRI of her cervical spine and brachial plexus is unremarkable. At recheck I found the patient sleeping in the room. She woke up and reports her symptoms have resolved. She is moving her bilateral arms without difficulty. She has no facial twitching.  I updated Dr. Roseanne RenoStewart of the good news and will discharge the patient.  I encouraged her to follow up with her PCP and neurology. I discussed return precautions. I advised the patient to follow-up with their primary care provider this week. I advised the patient to return to the emergency department with new or worsening symptoms or new concerns. The patient verbalized understanding and agreement with plan.    Results for orders placed or performed during the hospital encounter of 07/28/16  CBC with Differential/Platelet  Result Value Ref Range   WBC 6.7 4.0 - 10.5 K/uL   RBC 4.56 3.87 - 5.11 MIL/uL   Hemoglobin 13.1 12.0 - 15.0 g/dL   HCT 16.139.5 09.636.0 - 04.546.0 %   MCV 86.6 78.0 - 100.0 fL   MCH 28.7 26.0 - 34.0 pg   MCHC 33.2 30.0 - 36.0 g/dL   RDW 40.912.3 81.111.5 - 91.415.5 %   Platelets 316 150 - 400 K/uL   Neutrophils Relative % 72 %   Neutro Abs 4.8 1.7 - 7.7 K/uL   Lymphocytes Relative 22 %   Lymphs Abs 1.5 0.7 - 4.0 K/uL   Monocytes Relative 5 %   Monocytes Absolute 0.3 0.1 - 1.0 K/uL   Eosinophils Relative 1 %   Eosinophils Absolute 0.1 0.0 - 0.7 K/uL   Basophils Relative 0 %   Basophils Absolute 0.0 0.0 - 0.1 K/uL  Comprehensive metabolic panel  Result Value Ref Range   Sodium 138 135 - 145 mmol/L   Potassium 3.6 3.5 - 5.1 mmol/L   Chloride 106 101 - 111 mmol/L   CO2 23 22 - 32 mmol/L   Glucose, Bld 93 65 - 99  mg/dL   BUN 7 6 - 20 mg/dL   Creatinine, Ser 7.820.71 0.44 - 1.00 mg/dL   Calcium 9.1 8.9 - 95.610.3 mg/dL   Total Protein 7.5 6.5 - 8.1 g/dL   Albumin 3.4 (L) 3.5 - 5.0 g/dL   AST 18 15 - 41 U/L   ALT 17 14 - 54 U/L   Alkaline Phosphatase 71 38 - 126 U/L   Total Bilirubin 0.4 0.3 - 1.2 mg/dL   GFR calc non Af Amer >60 >60 mL/min   GFR calc Af Amer >60 >60 mL/min   Anion gap 9 5 - 15  Urinalysis, Routine w reflex microscopic (not [facility])  Result Value Ref Range   Color, Urine YELLOW YELLOW   APPearance CLEAR CLEAR   Specific Gravity, Urine 1.011 1.005 - 1.030   pH 7.0 5.0 - 8.0   Glucose, UA NEGATIVE NEGATIVE mg/dL   Hgb urine dipstick NEGATIVE NEGATIVE   Bilirubin Urine NEGATIVE NEGATIVE   Ketones, ur NEGATIVE NEGATIVE mg/dL   Protein, ur NEGATIVE NEGATIVE mg/dL   Nitrite NEGATIVE NEGATIVE   Leukocytes, UA MODERATE (A) NEGATIVE  Pregnancy, urine  Result Value Ref Range   Preg Test, Ur NEGATIVE NEGATIVE  Urine microscopic-add on  Result Value Ref Range   Squamous Epithelial / LPF 0-5 (A) NONE SEEN   WBC, UA 6-30 0 - 5 WBC/hpf   RBC / HPF 0-5 0 - 5 RBC/hpf   Bacteria, UA FEW (A) NONE SEEN   Ct Head Wo Contrast  Result Date: 07/28/2016 CLINICAL DATA:  Frontal headache, not feeling well, unable to open eyes, fell in January, history migraines, asthma, seizures, tremors, narcolepsy EXAM: CT HEAD WITHOUT CONTRAST TECHNIQUE: Contiguous axial images were obtained from the base of the skull through the vertex without intravenous contrast. COMPARISON:  11/14/2010 FINDINGS: Brain: Normal ventricular morphology. No midline shift or mass effect. Normal appearance of brain parenchyma. No intracranial hemorrhage, mass lesion, or evidence of acute infarction. No extra-axial fluid collections. Vascular: Unremarkable Skull: Intact Sinuses/Orbits: Clear Other: N/A IMPRESSION: No acute intracranial abnormalities. No interval change. Electronically Signed   By: Ulyses SouthwardMark  Boles M.D.   On: 07/28/2016 14:05    Mr Chest W Wo Contrast  Result Date: 07/28/2016 CLINICAL DATA:  Dropped weight at physical therapy, head numbness and tingling radiating to RIGHT face and eye with twitching. Now with LEFT-sided twitching and unable to open up LEFT eye for 1-2 hours. Unable to move RIGHT arm. Seen today for high blood pressure and tachycardia. Fell in January, subsequently diagnosed with regional pain syndrome and thoracic outlet syndrome. EXAM: MRI CERVICAL SPINE WITHOUT CONTRAST MRI BRACHIUM PLEXUS WITH AND WITHOUT CONTRAST. TECHNIQUE: Multiplanar, multisequence MR imaging of the cervical spine was performed. No intravenous contrast was administered. CONTRAST:  20 cc MultiHance COMPARISON:  None. FINDINGS: MRI CERVICAL SPINE: ALIGNMENT: Straightened cervical lordosis.  No malalignment. VERTEBRAE/DISCS: Vertebral bodies are intact. Intervertebral disc morphology's and signal are normal. Borderline congenital canal narrowing on the basis of foreshortened pedicles. No abnormal bone marrow signal. CORD:Cervical spinal cord is normal morphology and signal characteristics from the cervicomedullary junction to level of T1-2, the most caudal well visualized level. POSTERIOR FOSSA, VERTEBRAL ARTERIES, PARASPINAL TISSUES: No MR findings of ligamentous injury. Vertebral artery flow voids present. Included posterior fossa and paraspinal soft tissues are nonacute. Mild lymphadenopathy is likely reactive. DISC LEVELS (motion degraded axial sequences): C2 through 3 C4-5: No disc bulge, canal stenosis nor neural foraminal narrowing. C5-6: Small RIGHT subarticular disc protrusion versus motion artifact. No canal stenosis. Moderate RIGHT neural foraminal narrowing versus artifact. C6-7 and C7-T1: No disc bulge, canal stenosis nor neural foraminal narrowing. MRI BRACHIUM PLEXUS : Normal morphology, signal and course of the RIGHT tracheal plexus without abnormal enhancement. No perineural cyst. Normal symmetric appearance of the scalene  muscles. No mediastinal or apical lung mass. No anomalous of vasculature. Symmetric preservation of the supraclavicular fat. IMPRESSION: Negative MRI of the cervical spine without contrast. Negative MRI head of the RIGHT brachials plexus with and without contrast. Electronically Signed   By: Awilda Metroourtnay  Bloomer M.D.   On: 07/28/2016 20:17   Mr Cervical Spine Wo Contrast  Result Date: 07/28/2016 CLINICAL DATA:  Dropped weight at physical therapy, head numbness and tingling radiating to RIGHT face and eye with twitching. Now with LEFT-sided twitching and unable to open up LEFT eye for 1-2 hours. Unable to move RIGHT arm. Seen today for high blood pressure and tachycardia. Fell in January, subsequently diagnosed with regional pain syndrome and thoracic outlet syndrome. EXAM: MRI CERVICAL SPINE WITHOUT CONTRAST MRI BRACHIUM PLEXUS WITH AND WITHOUT CONTRAST. TECHNIQUE: Multiplanar, multisequence MR imaging of the cervical spine was performed. No intravenous contrast was administered. CONTRAST:  20 cc MultiHance COMPARISON:  None.  FINDINGS: MRI CERVICAL SPINE: ALIGNMENT: Straightened cervical lordosis.  No malalignment. VERTEBRAE/DISCS: Vertebral bodies are intact. Intervertebral disc morphology's and signal are normal. Borderline congenital canal narrowing on the basis of foreshortened pedicles. No abnormal bone marrow signal. CORD:Cervical spinal cord is normal morphology and signal characteristics from the cervicomedullary junction to level of T1-2, the most caudal well visualized level. POSTERIOR FOSSA, VERTEBRAL ARTERIES, PARASPINAL TISSUES: No MR findings of ligamentous injury. Vertebral artery flow voids present. Included posterior fossa and paraspinal soft tissues are nonacute. Mild lymphadenopathy is likely reactive. DISC LEVELS (motion degraded axial sequences): C2 through 3 C4-5: No disc bulge, canal stenosis nor neural foraminal narrowing. C5-6: Small RIGHT subarticular disc protrusion versus motion  artifact. No canal stenosis. Moderate RIGHT neural foraminal narrowing versus artifact. C6-7 and C7-T1: No disc bulge, canal stenosis nor neural foraminal narrowing. MRI BRACHIUM PLEXUS : Normal morphology, signal and course of the RIGHT tracheal plexus without abnormal enhancement. No perineural cyst. Normal symmetric appearance of the scalene muscles. No mediastinal or apical lung mass. No anomalous of vasculature. Symmetric preservation of the supraclavicular fat. IMPRESSION: Negative MRI of the cervical spine without contrast. Negative MRI head of the RIGHT brachials plexus with and without contrast. Electronically Signed   By: Awilda Metro M.D.   On: 07/28/2016 20:17    Vitals:   07/28/16 2100 07/28/16 2200 07/28/16 2300 07/29/16 0013  BP: 144/80   133/94  Pulse: 100 95 91 98  Resp: 20 17 16 11   Temp:      TempSrc:      SpO2: 99% 98% 98% 99%  Weight:      Height:        Paresthesias  Injury - Plan: MR CHEST W WO CONTRAST, MR CHEST W WO CONTRAST     Everlene Farrier, PA-C 07/29/16 0025    Tilden Fossa, MD 07/30/16 [phone]

## 2016-08-01 ENCOUNTER — Telehealth: Payer: Self-pay | Admitting: Neurology

## 2016-08-01 NOTE — Telephone Encounter (Signed)
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  [place] [facility]. " Pt reports that she is in a worker's comp case as well.  Dr. Vickey Hugerohmeier, please review [name]'s documentation from this call and let us know if you would like to see this pt for parathesia.

## 2016-08-01 NOTE — Telephone Encounter (Signed)
Pt returned my call. I advised her that Dr. Vickey Hugerohmeier will not see her for the new problem because we don't see worker's comp. Pt says that she would like to keep the appt for 08/15/16 to discuss her narcolepsy because she missed her last appt with Dr. Vickey Hugerohmeier. I encouraged her to see her other neurologist that is working with her on the worker's comp case regarding her new problem. Pt verbalized understanding.

## 2016-08-01 NOTE — Telephone Encounter (Signed)
We don't see worker's comp. She may see Np for her existing problem on 11-21-.

## 2016-08-01 NOTE — Telephone Encounter (Signed)
I called pt to discuss. No answer, left a message asking her to call me back. 

## 2016-08-01 NOTE — Telephone Encounter (Signed)
Pt called in to make hospital f/u appt. I made an appt for the pt for 11/21. As I was speaking with the pt she mentioned the issues she is having are on the same side she is being seen for as worker comp. She stated the ER told her that it did not seem related though. I asked the pt if she has made an appt to see the other neurologist for workers comp. Pt stated, " My lawyer is looking into that for me." I advised the pt our office does not see workers comp pts and I will have to ask the nurse and physician if it is ok to keep the appt for 11/21. Pt said, " ok" and I reiterated her appt date and time. I also told her the office will call to let her know either way about the appt with our office. She expressed understanding.

## 2016-08-10 ENCOUNTER — Ambulatory Visit: Payer: Managed Care, Other (non HMO) | Admitting: Adult Health

## 2016-08-15 ENCOUNTER — Ambulatory Visit (INDEPENDENT_AMBULATORY_CARE_PROVIDER_SITE_OTHER): Payer: Managed Care, Other (non HMO) | Admitting: Neurology

## 2016-08-15 ENCOUNTER — Encounter: Payer: Self-pay | Admitting: Neurology

## 2016-08-15 VITALS — BP 129/82 | HR 101 | Resp 20 | Ht 61.0 in | Wt 203.0 lb

## 2016-08-15 DIAGNOSIS — F988 Other specified behavioral and emotional disorders with onset usually occurring in childhood and adolescence: Secondary | ICD-10-CM | POA: Diagnosis not present

## 2016-08-15 DIAGNOSIS — E6609 Other obesity due to excess calories: Secondary | ICD-10-CM | POA: Diagnosis not present

## 2016-08-15 DIAGNOSIS — R0683 Snoring: Secondary | ICD-10-CM | POA: Diagnosis not present

## 2016-08-15 DIAGNOSIS — G4710 Hypersomnia, unspecified: Secondary | ICD-10-CM

## 2016-08-15 DIAGNOSIS — F3341 Major depressive disorder, recurrent, in partial remission: Secondary | ICD-10-CM | POA: Diagnosis not present

## 2016-08-15 MED ORDER — ARMODAFINIL 200 MG PO TABS: ORAL_TABLET | ORAL | 5 refills | Status: DC

## 2016-08-15 MED ORDER — AMPHETAMINE-DEXTROAMPHETAMINE 5 MG PO TABS: ORAL_TABLET | ORAL | 0 refills | Status: DC

## 2016-08-15 MED ORDER — AMPHETAMINE-DEXTROAMPHET ER 30 MG PO CP24: 30.0000 mg | ORAL_CAPSULE | Freq: Every day | ORAL | 0 refills | Status: DC

## 2016-08-15 NOTE — Patient Instructions (Signed)
Amphetamine oral tablets What is this medicine? AMPHETAMINE (am FET a meen) is used to treat attention-deficit hyperactivity disorder (ADHD) and a sleep disorder called narcolepsy. It is also used as a short-term treatment, along with a reduced calorie diet and exercise, to help you lose weight. Federal law prohibits giving this medicine to any person other than the person for whom it was prescribed. Do not share this medicine with anyone else. Do not use this medicine for a condition for which it was not prescribed. COMMON BRAND NAME(S): Evekeo What should I tell my health care provider before I take this medicine? They need to know if you have any of these conditions: -anxiety or panic attacks -circulation problems in fingers and toes -glaucoma -hardening or blockages of the arteries or heart blood vessels -heart disease or a heart defect -high blood pressure -history of a drug or alcohol abuse problem -history of stroke -kidney disease -liver disease -mental illness -seizures -suicidal thoughts, plans, or attempt; a previous suicide attempt by you or a family member -thyroid disease -Tourette's syndrome -an unusual or allergic reaction to dextroamphetamine, other amphetamines, other medicines, foods, dyes, or preservatives -pregnant or trying to get pregnant -breast-feeding How should I use this medicine? Take this medicine by mouth with a glass of water. Follow the directions on the prescription label. Take your doses at regular intervals. Do not take this medicine close to bedtime. It may prevent you from sleeping. If you are taking this medicine to help you lose weight, take as directed 30 to 60 minutes before meals. Do not take your medicine more often than directed. Do not suddenly stop your medicine. You must gradually reduce the dose or you may feel withdrawal effects. Ask your doctor or health care professional for advice. A special MedGuide will be given to you by the pharmacist  with each prescription and refill. Be sure to read this information carefully each time. Talk to your pediatrician regarding the se of this medicine in children. While this drug may be prescribed for children as young as 193 years of age for selected conditions, precautions do apply. What if I miss a dose? If you miss a dose, take it as soon as you can. If it is almost time for your next dose, take only that dose. Do not take double or extra doses. What may interact with this medicine? Do not take this medicine with any of the following medications: -MAOIS like Carbex, Eldepryl, Marplan, Nardil, and Parnate -other stimulant medicines for attention disorders, weight loss, or to stay awake This medicine may also interact with the following medications: -acetazolamide -ammonium chloride -antacids -ascorbic acid -atomoxetine -caffeine -certain medicines for blood pressure -certain medicines for depression, anxiety, or psychotic disturbances -certain medicines for seizures like carbamazepine, phenobarbital, phenytoin -certain medicines for stomach problems like cimetidine, famotidine, omeprazole, lansoprazole -cold or allergy medicines -ethosuximide -glutamic acid -lithium -meperidine -methenamine; sodium acid phosphate -narcotic medicines for pain -norepinephrine -phenothiazines like chlorpromazine, mesoridazine, prochlorperazine, thioridazine -sodium bicarbonate What should I watch for while using this medicine? Visit your doctor or health care professional for regular checks on your progress. This prescription requires that you follow special procedures with your doctor and pharmacy. You will need to have a new written prescription from your doctor every time you need a refill. This medicine may affect your concentration, or hide signs of tiredness. Until you know how this medicine affects you, do not drive, ride a bicycle, use machinery, or do anything that needs mental alertness. Tell  your doctor or health care professional if this medicine loses its effects, or if you feel you need to take more than the prescribed amount. Do not change the dosage without talking to your doctor or health care professional. Decreased appetite is a common side effect when starting this medicine. Eating small, frequent meals or snacks can help. Talk to your doctor if you continue to have poor eating habits. Height and weight growth of a child taking this medicine will be monitored closely. If you are going to need surgery, a MRI, CT scan, or other procedure, tell your doctor that you are taking this medicine. You may need to stop taking this medicine before the procedure. Tell your doctor or healthcare professional right away if you notice unexplained wounds on your fingers and toes while taking this medicine. You should also tell your healthcare provider if you experience numbness or pain, changes in the skin color, or sensitivity to temperature in your fingers or toes. What side effects may I notice from receiving this medicine? Side effects that you should report to your doctor or health care professional as soon as possible: -allergic reactions like skin rash, itching or hives, swelling of the face, lips, or tongue -changes in vision -chest pain or chest tightness -confusion, trouble speaking or understanding -fast, irregular heartbeat -fingers or toes feel numb, cool, painful -hallucination, loss of contact with reality -high blood pressure -males: prolonged or painful erection -seizures -severe headaches -shortness of breath -suicidal thoughts or other mood changes -trouble walking, dizziness, loss of balance or coordination -uncontrollable head, mouth, neck, arm, or leg movements Side effects that usually do not require medical attention (report to your doctor or health care professional if they continue or are bothersome): -anxious -headache -loss of appetite -nausea,  vomiting -trouble sleeping -weight loss Where should I keep my medicine? Keep out of the reach of children. This medicine can be abused. Keep your medicine in a safe place to protect it from theft. Do not share this medicine with anyone. Selling or giving away this medicine is dangerous and against the law. Store at room temperature between 20 and 25 degrees C (68 and 77 degrees F). Keep container tightly closed. Throw away any unused medicine after the expiration date.  2017 [employer] Standard (2014-07-15 22:50:15)

## 2016-08-15 NOTE — Addendum Note (Signed)
Addended by: Melvyn NovasHMEIER, Tylik Treese on: 08/15/2016 10:27 AM   Modules accepted: Orders

## 2016-08-15 NOTE — Progress Notes (Addendum)
PATIENT: [name] DOB: April 20, 1986  REASON FOR VISIT: follow up-  Seizures, narcolepsy HISTORY FROM: patient  HISTORY OF PRESENT ILLNESS: Interval history from 08/15/2016, [name], meanwhile 30 year old mother of 2 is seen here today for increasing and excessive daytime sleepiness. The patient has a history of seizures. She has not had a seizure in over 24 month. She used to be on Lamictal which also was used for mood stabilization helping his depression, but she is now off. She uses gabapentin and Cymbalta for work related back injury /shoulder injury on the right. She continues to take Adderall but with less effect to control her daytime sleepiness. The Epworth sleepiness score was endorsed at 18 points today fatigue severity at 59 points, both very high. 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 Due to her comorbidities and medication needs, an M SLT would be deemed not valid. The patient cannot wean off her medication in preparation for this test  CD    MM: [name] is a 30 year old female with a history of seizures and narcolepsy.  She returns 01-10-16  for follow-up. The patient continues to take Adderall extended release 30 mg daily. She reports that initially this was working very well. She states that for the last month she's noticed that she has began to be more sleepy in the afternoons.  The patient was also on Lamictal for seizures. She states that she is no longer taking the Lamictal.  She states that she has not had a seizure in over 18 month . She states that in the past she's had 2 grand mal seizures.  She feels that since she's been taking Adderall this is made her feel overall better. She does report increasing fatigue and endorsed the Epworth sleepiness score at 13 points and the fatigue severity score at 47 points, both elevated.   HISTORY 08/25/14: [name] is a 30 year old female with a history of seizures and narcolepsy. She returns today for  follow-up. The patient is currently [redacted] weeks pregnant. She was taken off her medication for narcolepsy until after the first trimester. She continues to take Lamictal for seizures. Patient is very tearful during the visit. She states that she is very depressed since coming off her adderall. She states that she is very tired throughout the day and her husband has even noticed a changed in her mood. She states that she has even had thoughts of "getting rid of the baby" so she can feel normal. She denies any seizure events and is tolerating Lamictal fine. Other than the pregnancy,no new medical history since last seen.   HISTORY 06/29/14: [name] is a 30 year old female with a history of seizures and narcolepsy. She returns today for follow-up. She is currently taking Lamictal 300 mg daily for seizures and Provigil 200 mg for daytime sleepiness. She reports that she continues to feel fatigued and sleepy throughout the day. Her Epworth is 20 and Fatigue Severity score is 60. She feels that the Provigil offers some relief but would like to try something else.She sleeps 6-7 hours a night. Goes to bed between 11:00 pm- 12:00 am and arises at 6:30 am. She does have trouble falling asleep and therefore some nights she will only get 3-4 hours of sleep. She does use Klonopin to help her fall asleep. She reports that Lamictal has controlled her seizures. Her last seizure was in April.     REVIEW OF SYSTEMS: Out of a complete 14 system review  of symptoms, the patient complains only of the following symptoms, and all other reviewed systems are negative.   frequent waking, daytime sleepiness, How likely are you to doze in the following situations: 0 = not likely, 1 = slight chance, 2 = moderate chance, 3 = high chance  Sitting and Reading?3 Watching Television?3 Sitting inactive in a public place (theater or meeting)?2 Lying down in the afternoon when circumstances permit?3 Sitting and talking to someone?2 Sitting  quietly after lunch without alcohol?1 In a car, while stopped for a few minutes in traffic?0 As a passenger in a car for an hour without a break?3  Total = 18   ALLERGIES: HOME MEDICATIONS: Outpatient Medications Prior to Visit  Medication Sig Dispense Refill  . amphetamine-dextroamphetamine (ADDERALL XR) 30 MG 24 hr capsule Take 1 capsule (30 mg total) by mouth daily. 30 capsule 0  . amphetamine-dextroamphetamine (ADDERALL) 5 MG tablet Take 1 tablet PO at lunch if needed. (Patient taking differently: Take 5 mg by mouth every evening. Take in afternoon if needed) 30 tablet 0  . cyclobenzaprine (FLEXERIL) 10 MG tablet Take 10 mg by mouth at bedtime as needed for muscle spasms.    Marland Kitchen gabapentin (NEURONTIN) 300 MG capsule Take 300-1,200 mg by mouth 2 (two) times daily. 300 mg in the morning and 1200 mg at bedtime    . norgestrel-ethinyl estradiol (LO/OVRAL,CRYSELLE) 0.3-30 MG-MCG tablet Take 1 tablet by mouth at bedtime.     Marland Kitchen amitriptyline (ELAVIL) 10 MG tablet Take 10 mg by mouth at bedtime.    . methocarbamol (ROBAXIN) 500 MG tablet TAKE 1 TABLET BY MOUTH TWICE A DAY AS NEEDED FOR SPASMS DURING DAYTIME  1  . traMADol (ULTRAM) 50 MG tablet Take 50 mg by mouth daily as needed for moderate pain.     Marland Kitchen triamcinolone cream (KENALOG) 0.5 % Apply 1 application topically daily as needed (itchy rashes on knees and elbows.).      No facility-administered medications prior to visit.     PAST MEDICAL HISTORY: Past Medical History:  Diagnosis Date  . Anemia    takes Ferrous Sulfate daily  . Anxiety    takes Klonopin nightly  . ASTHMA NOS W/ACUTE EXACERBATION 01/05/2010   exercise induced  . Back pain   . Coarse tremors   . Degenerative disc disease    back  . Gallstones   . GERD (gastroesophageal reflux disease)    but doesn't take anything for it  . History of kidney stones   . History of migraine    last one a couple of days ago  . History of UTI    frequent  . IBS (irritable bowel  syndrome)    doesn't take any meds  . Medullary sponge kidney 2007   kidneys can be sick but she isnt sick  . MIGRAINES, HX OF 06/24/2007  . Narcolepsy   . 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 OCD (obsessive compulsive disorder)    takes Prozac daily  . PYELONEPHRITIS 06/24/2007  . Seizures ([place])    takes Lamictal daily;last seizure was in 11/14/10  . Urinary frequency     PAST SURGICAL HISTORY: Past Surgical History:  Procedure Laterality Date  . BACK SURGERY    . CHOLECYSTECTOMY N/A 12/02/2013   Procedure: LAPAROSCOPIC CHOLECYSTECTOMY WITH ATTEMPTED INTRAOPERATIVE CHOLANGIOGRAM;  Surgeon: Gayland Curry, MD;  Location: [facility];  Service: General;  Laterality: N/A;  . LUMBAR LAMINECTOMY/DECOMPRESSION MICRODISCECTOMY  07/24/2012   Procedure: LUMBAR LAMINECTOMY/DECOMPRESSION MICRODISCECTOMY;  Surgeon: Tobi Bastos, MD;  Location:  [facility];  Service: Orthopedics;  Laterality: Right;  L5-S1  . WISDOM TOOTH EXTRACTION      FAMILY HISTORY: Family History  Problem Relation Age of Onset  . Heart disease Mother   . ADD / ADHD Sister   . Anesthesia problems Neg Hx     SOCIAL HISTORY: Social History   Social History  . Marital status: Married    Spouse name: N/A  . Number of children: 2  . Years of education: 14   Occupational History  . 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 Smoking status: Never Smoker  . Smokeless tobacco: Never Used  . Alcohol use No     Comment: occas. when not pregnant  . Drug use: No  . Sexual activity: Yes    Partners: Male     Comment: last sex  Aug 22 2014   Other Topics Concern  . Not on file   Social History Narrative   HSG, finished 2 years of college. single mom with 2 sons blake - Aug '09, Elijah Dec '12. 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 Still lives with father of her sons with wedding plans on hold ( Jan '13)     PHYSICAL EXAM  Vitals:   08/15/16 0956    BP: 129/82  Pulse: (!) 101  Resp: 20  Weight: 203 lb (92.1 kg)  Height: 5' 1" (1.549 m)   Body mass index is 38.36 kg/m.  Generalized: Well developed, in no acute distress ,  neck 15 inches, Mallampati 3 .   Neurological examination  Mentation: Alert oriented to time, place, history taking. Follows all commands speech and language fluent Cranial nerve, no change in sense of smell or taste,  Pupils were equal round reactive to light. Extraocular movements were full, visual field were full on confrontational test. Facial sensation and strength were normal. Uvula tongue midline. Head turning and shoulder shrug  were normal and symmetric. Motor: she has grip weakness in the right hand. She feels clumsiness and weakness throughout the right shoulder and upper extremity- worker's comp.  Gait and station: Gait is normal. Tandem gait is normal. Romberg is negative. No drift is seen.  Reflexes: Deep tendon reflexes are symmetric  bilaterally.   DIAGNOSTIC DATA (LABS, IMAGING, TESTING) - I reviewed patient records, labs, notes, testing and imaging myself where available. Labs reviewed.   ASSESSMENT AND PLAN 30 y.o. year old , seen in a 25 minute RV, married  female, mother of 2, presenting with :   has a past medical history of Anemia; Anxiety; ASTHMA NOS W/ACUTE EXACERBATION (01/05/2010); Back pain; Coarse tremors; Degenerative disc disease; Gallstones; GERD (gastroesophageal reflux disease); History of kidney stones; History of migraine; History of UTI; IBS (irritable bowel syndrome); Medullary sponge kidney (2007); MIGRAINES, HX OF (06/24/2007); Narcolepsy; Narcolepsy (05/18/2014); OCD (obsessive compulsive disorder); PYELONEPHRITIS (06/24/2007); Seizures ([place]); and Urinary frequency. here with:   1. Excessive daytime fatigue with a fatigue severity score  of 47 points. I suspect depression plays a role , as well as organic sleep reasons and medication overlap.  She does report that she is  beginning to be more sleepy in the afternoons. Epworth 18 points. HLA positive for narcoelpsy, MSLT not possible. HST ordered to again rule out apnea after significant weight gain, attributed to gabapentin .    She will continue taking Adderall extended release 30 mg daily. It  last through 3 Pm but is less effective during that time. She  is not well organized, falls asleep easily, is unattentive . She states that it may interfere with her nocturnal sleep and her lunchtime is very variable. She reports sleep attacks , 1-2 a day.  She schedules power naps, but feels worse.    Seizure; At this time the patient is adamant that she does not want to be on any other  medication. She will follow-up in 6 months with Np, alternating with me , Dr. Brett Fairy. Her spells have been extremely rare and possible non organic. 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    DOHMEIER,CARMEN, MD  08/15/2016, 10:05 AM [facility] [address] [place], [place] [address] [phone]   Cc; Gwenlyn Perking, NP

## 2016-08-21 ENCOUNTER — Telehealth: Payer: Self-pay

## 2016-08-21 NOTE — Telephone Encounter (Signed)
PA for armodafinil completed via [employer] [facility]. Approved from 07/22/2016 to 08/21/17. [facility] on [address] notified.

## 2016-08-24 ENCOUNTER — Telehealth: Payer: Self-pay

## 2016-08-24 NOTE — Telephone Encounter (Signed)
Yes, lets repeat HST. This does not affect need for Modafinil.

## 2016-08-24 NOTE — Telephone Encounter (Signed)
I called pt to advise her that Dr. Vickey Hugerohmeier still wants her sleep study completed. No answer, left a message asking her to call me back. If pt calls back, please advise her of this information.

## 2016-08-24 NOTE — Telephone Encounter (Signed)
This patient said Dr. Vickey Hugerohmeier ordered the HST so her insurance would approve her medication.  However, she just learned today from insurance company that medication has been approved without a sleep study.  She is asking me does she still need to have the HST

## 2016-08-24 NOTE — Telephone Encounter (Signed)
Modafinil was approved by her insurance. However, per Dr. Oliva Bustardohmeier's note, the HST was ordered to rule out apnea following significant weight gain.  I will send to Dr. Vickey Hugerohmeier to ensure that she still wants the pt to have an HST.

## 2016-08-29 NOTE — Telephone Encounter (Signed)
I spoke to pt and advised her that Dr. Vickey Hugerohmeier does want her to complete an HST to rule out apnea. Pt is agreeable to scheduling the HST but is requesting that our sleep lab call her at the beginning of the year so she can use next year's insurance. [facility].

## 2016-09-21 NOTE — Telephone Encounter (Signed)
error 

## 2016-09-22 ENCOUNTER — Encounter: Payer: Self-pay | Admitting: Neurology

## 2016-09-26 ENCOUNTER — Encounter: Payer: Self-pay | Admitting: Neurology

## 2016-09-26 DIAGNOSIS — F5101 Primary insomnia: Secondary | ICD-10-CM

## 2016-10-02 MED ORDER — MIRTAZAPINE 15 MG PO TABS: 15.0000 mg | ORAL_TABLET | Freq: Every evening | ORAL | 2 refills | Status: DC | PRN

## 2016-10-13 ENCOUNTER — Other Ambulatory Visit: Payer: Self-pay | Admitting: Neurology

## 2016-10-13 DIAGNOSIS — G4710 Hypersomnia, unspecified: Secondary | ICD-10-CM

## 2016-10-13 DIAGNOSIS — F988 Other specified behavioral and emotional disorders with onset usually occurring in childhood and adolescence: Secondary | ICD-10-CM

## 2016-10-16 MED ORDER — AMPHETAMINE-DEXTROAMPHET ER 30 MG PO CP24: 30.0000 mg | ORAL_CAPSULE | Freq: Every day | ORAL | 0 refills | Status: DC

## 2016-10-16 MED ORDER — AMPHETAMINE-DEXTROAMPHETAMINE 5 MG PO TABS: ORAL_TABLET | ORAL | 0 refills | Status: DC

## 2016-10-23 MED ORDER — AMPHETAMINE-DEXTROAMPHET ER 30 MG PO CP24: 30.0000 mg | ORAL_CAPSULE | Freq: Every day | ORAL | 0 refills | Status: DC

## 2016-10-23 MED ORDER — AMPHETAMINE-DEXTROAMPHETAMINE 5 MG PO TABS: ORAL_TABLET | ORAL | 0 refills | Status: DC

## 2016-10-23 NOTE — Addendum Note (Signed)
Addended by: Melvyn NovasHMEIER, Takyla Kuchera on: 10/23/2016 10:11 AM   Modules accepted: Orders

## 2017-01-01 ENCOUNTER — Other Ambulatory Visit: Payer: Self-pay | Admitting: Neurology

## 2017-01-01 DIAGNOSIS — F5101 Primary insomnia: Secondary | ICD-10-CM

## 2017-01-18 ENCOUNTER — Telehealth: Payer: Self-pay | Admitting: Neurology

## 2017-01-18 NOTE — Telephone Encounter (Signed)
Patient called 5 times to schedule.  Patient will call back when ready to schedule sleep study.

## 2017-02-12 ENCOUNTER — Encounter: Payer: Self-pay | Admitting: Adult Health

## 2017-02-12 ENCOUNTER — Ambulatory Visit (INDEPENDENT_AMBULATORY_CARE_PROVIDER_SITE_OTHER): Payer: Managed Care, Other (non HMO) | Admitting: Adult Health

## 2017-02-12 VITALS — BP 156/88 | HR 108 | Ht 61.0 in | Wt 227.0 lb

## 2017-02-12 DIAGNOSIS — G47411 Narcolepsy with cataplexy: Secondary | ICD-10-CM

## 2017-02-12 DIAGNOSIS — G4700 Insomnia, unspecified: Secondary | ICD-10-CM | POA: Diagnosis not present

## 2017-02-12 DIAGNOSIS — F329 Major depressive disorder, single episode, unspecified: Secondary | ICD-10-CM

## 2017-02-12 DIAGNOSIS — F32A Depression, unspecified: Secondary | ICD-10-CM

## 2017-02-12 MED ORDER — TRAZODONE HCL 50 MG PO TABS: 50.0000 mg | ORAL_TABLET | Freq: Every day | ORAL | 5 refills | Status: DC

## 2017-02-12 NOTE — Progress Notes (Signed)
I agree with the assessment and plan as directed by NP .The patient is known to me .   Jaicee Michelotti, MD  

## 2017-02-12 NOTE — Progress Notes (Signed)
PATIENT: [name] DOB: 04/02/1986  REASON FOR VISIT: follow up- narcolepsy HISTORY FROM: patient  HISTORY OF PRESENT ILLNESS: Today 02/12/2017 [name] is a 31 year old female with a history of narcolepsy. She returns today for follow-up. She still states that she has significant fatigue throughout the day. She states that "on the inside she feels fatigued and wants to nap but the medication will not let her go to sleep." She initially denies any depression but by the end of the visit she is very tearful and crying. Reports that she does not have a good support system with her parents. She is now reporting that she actually may be suffering from some mild depression. Reports that she is no longer taking Cymbalta or amitriptyline. She states that she's been taking Adderall on an as-needed basis that she is not working at this time. She does take Nuvigil every morning at 7 AM. She states that she usually does well throughout the day until 5 PM. She was being seen at [facility] for chronic regional pain syndrome. She thought that we could see her for the same problem. She reports that since she started Remeron she has gained a significant amount of weight and dreams all night long. She reports that she actually has a decreased appetite and eats very little despite weight gain. Denies any swelling in the extremities. In the past she was seeing a psychiatrist but has not followed up since she was diagnosed with narcolepsy.  HISTORY Interval history from 08/15/2016, [name], meanwhile 42 year old mother of 2 is seen here today for increasing and excessive daytime sleepiness. The patient has a history of seizures. She has not had a seizure in over 24 month. She used to be on Lamictal which also was used for mood stabilization helping his depression, but she is now off. She uses gabapentin and Cymbalta for work related back injury /shoulder injury on the right. She continues to take Adderall but with less  effect to control her daytime sleepiness. The Epworth sleepiness score was endorsed at 18 points today fatigue severity at 59 points, both very high. 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 Due to her comorbidities and medication needs, an M SLT would be deemed not valid. The patient cannot wean off her medication in preparation for this test  REVIEW OF SYSTEMS: Out of a complete 14 system review of symptoms, the patient complains only of the following symptoms, and all other reviewed systems are negative.  Cough, shortness of breath, chest tightness, ringing in ears, unexpected weight change, fever, fatigue, chills, appetite change, black stools, diarrhea, nausea, restless leg, insomnia, daytime sleepiness, back pain, aching muscles, neck pain, agitation, headache, frequency of urination, food allergies, environmental allergies  ALLERGIES: Allergies  Allergen Reactions  . Other Anaphylaxis    balsalmic vingerette  . Soy Allergy Anaphylaxis  . Sumatriptan     Other reaction(s): Other unknown  . Zolmitriptan Other (See Comments)    Other reaction(s): Other unknown Reaction unknown  . Ciprofloxacin Other (See Comments)    Patient stated that her arm started burning really intensely.  . Nsaids Other (See Comments)    Told to avoid NSAIDS due to her kidney disorder.  . Topiramate Other (See Comments)    Reaction unknown, childhood allergy.  . Triptans     Happened maybe in '05 possibly, reaction unsure.   . Adhesive [Tape] Itching and Rash    HOME MEDICATIONS: Outpatient Medications Prior to Visit  Medication Sig Dispense  Refill  . amitriptyline (ELAVIL) 25 MG tablet Take 25 mg by mouth at bedtime. Take 1-2 tablets at bedtime.    Marland Kitchen amphetamine-dextroamphetamine (ADDERALL XR) 30 MG 24 hr capsule Take 1 capsule (30 mg total) by mouth daily. 30 capsule 0  . amphetamine-dextroamphetamine (ADDERALL) 5 MG tablet Take 1 tablet PO at lunch if needed. 30 tablet 0  . Armodafinil  200 MG TABS Take 1 tablet in a.m. by mouth. 30 tablet 5  . cyclobenzaprine (FLEXERIL) 10 MG tablet Take 10 mg by mouth at bedtime as needed for muscle spasms.    . diclofenac sodium (VOLTAREN) 1 % GEL Place onto the skin.    . DULoxetine (CYMBALTA) 60 MG capsule Take 60 mg by mouth.    . gabapentin (NEURONTIN) 300 MG capsule Take 300-1,200 mg by mouth 2 (two) times daily. 300 mg in the morning and 1200 mg at bedtime    . metoprolol (LOPRESSOR) 50 MG tablet Take 50 mg by mouth daily.    . mirtazapine (REMERON) 15 MG tablet TAKE 1 TABLET (15 MG TOTAL) BY MOUTH AT BEDTIME AS NEEDED. 30 tablet 1  . norgestrel-ethinyl estradiol (LO/OVRAL,CRYSELLE) 0.3-30 MG-MCG tablet Take 1 tablet by mouth at bedtime.      No facility-administered medications prior to visit.     PAST MEDICAL HISTORY: Past Medical History:  Diagnosis Date  . Anemia    takes Ferrous Sulfate daily  . Anxiety    takes Klonopin nightly  . ASTHMA NOS W/ACUTE EXACERBATION 01/05/2010   exercise induced  . Back pain   . Coarse tremors   . Degenerative disc disease    back  . Gallstones   . GERD (gastroesophageal reflux disease)    but doesn't take anything for it  . History of kidney stones   . History of migraine    last one a couple of days ago  . History of UTI    frequent  . IBS (irritable bowel syndrome)    doesn't take any meds  . Medullary sponge kidney 2007   kidneys can be sick but she isnt sick  . MIGRAINES, HX OF 06/24/2007  . Narcolepsy   . 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 OCD (obsessive compulsive disorder)    takes Prozac daily  . PYELONEPHRITIS 06/24/2007  . Seizures ([place])    takes Lamictal daily;last seizure was in 11/14/10  . Urinary frequency     PAST SURGICAL HISTORY: Past Surgical History:  Procedure Laterality Date  . BACK SURGERY    . CHOLECYSTECTOMY N/A 12/02/2013   Procedure: LAPAROSCOPIC CHOLECYSTECTOMY WITH ATTEMPTED INTRAOPERATIVE CHOLANGIOGRAM;  Surgeon: Gayland Curry, MD;  Location: [place] [facility];   Service: General;  Laterality: N/A;  . LUMBAR LAMINECTOMY/DECOMPRESSION MICRODISCECTOMY  07/24/2012   Procedure: LUMBAR LAMINECTOMY/DECOMPRESSION MICRODISCECTOMY;  Surgeon: Tobi Bastos, MD;  Location: [facility];  Service: Orthopedics;  Laterality: Right;  L5-S1  . WISDOM TOOTH EXTRACTION      FAMILY HISTORY: Family History  Problem Relation Age of Onset  . Heart disease Mother   . ADD / ADHD Sister   . Anesthesia problems Neg Hx     SOCIAL HISTORY: Social History   Social History  . Marital status: Married    Spouse name: N/A  . Number of children: 2  . Years of education: 14   Occupational History  . 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 Smoking status: Never Smoker  . Smokeless tobacco: Never Used  .  Alcohol use No     Comment: occas. when not pregnant  . Drug use: No  . Sexual activity: Yes    Partners: Male     Comment: last sex  Aug 22 2014   Other Topics Concern  . Not on file   Social History Narrative   HSG, finished 2 years of college. single mom with 2 sons blake - Aug '09, Elijah Dec '12. 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 Still lives with father of her sons with wedding plans on hold ( Jan '13)      PHYSICAL EXAM  Vitals:   02/12/17 0846  BP: (!) 156/88  Pulse: (!) 108  Weight: 227 lb (103 kg)  Height: '5\' 1"'  (1.549 m)   Body mass index is 42.89 kg/m.  Generalized: Well developed, in no acute distress   Neurological examination  Mentation: Alert oriented to time, place, history taking. Follows all commands speech and language fluent Cranial nerve II-XII: Pupils were equal round reactive to light. Extraocular movements were full, visual field were full on confrontational test. Facial sensation and strength were normal. Uvula tongue midline. Head turning and shoulder shrug  were normal and symmetric. Motor: The motor testing reveals 5 over 5 strength  of all 4 extremities. Good symmetric motor tone is noted throughout.  Sensory: Sensory testing is intact to soft touch on all 4 extremities. No evidence of extinction is noted.  Coordination: Cerebellar testing reveals good finger-nose-finger and heel-to-shin bilaterally.  Gait and station: Gait is normal. Tandem gait is normal. Romberg is negative. No drift is seen.  Reflexes: Deep tendon reflexes are symmetric and normal bilaterally.   DIAGNOSTIC DATA (LABS, IMAGING, TESTING) - I reviewed patient records, labs, notes, testing and imaging myself where available.  Lab Results  Component Value Date   WBC 6.7 07/28/2016   HGB 13.1 07/28/2016   HCT 39.5 07/28/2016   MCV 86.6 07/28/2016   PLT 316 07/28/2016      Component Value Date/Time   NA 138 07/28/2016 1429   NA 141 10/30/2013 1615   K 3.6 07/28/2016 1429   CL 106 07/28/2016 1429   CO2 23 07/28/2016 1429   GLUCOSE 93 07/28/2016 1429   BUN 7 07/28/2016 1429   BUN 10 10/30/2013 1615   CREATININE 0.71 07/28/2016 1429   CREATININE 0.77 12/23/2013 0837   CALCIUM 9.1 07/28/2016 1429   PROT 7.5 07/28/2016 1429   PROT 7.2 10/30/2013 1615   ALBUMIN 3.4 (L) 07/28/2016 1429   ALBUMIN 4.3 10/30/2013 1615   AST 18 07/28/2016 1429   ALT 17 07/28/2016 1429   ALKPHOS 71 07/28/2016 1429   BILITOT 0.4 07/28/2016 1429   GFRNONAA >60 07/28/2016 1429   GFRAA >60 07/28/2016 1429    Lab Results  Component Value Date   VITAMINB12 275 10/30/2013       ASSESSMENT AND PLAN 31 y.o. year old female  has a past medical history of Anemia; Anxiety; ASTHMA NOS W/ACUTE EXACERBATION (01/05/2010); Back pain; Coarse tremors; Degenerative disc disease; Gallstones; GERD (gastroesophageal reflux disease); History of kidney stones; History of migraine; History of UTI; IBS (irritable bowel syndrome); Medullary sponge kidney (2007); MIGRAINES, HX OF (06/24/2007); Narcolepsy; Narcolepsy (05/18/2014); OCD (obsessive compulsive disorder); PYELONEPHRITIS  (06/24/2007); Seizures ([place]); and Urinary frequency. here with:  1. Narcolepsy 2. Insomnia 3. Depression  For now the patient will continue taking Adderall and Nuvigil. The patient has had significant weight gain since starting Remeron. This medication will  be discontinued. She will try taking trazodone 50 mg at bedtime for insomnia and this potentially could help with depression. Advised that if she is unable to tolerate 50 mg she should take half a tablet at bedtime. I have reviewed side effects of trazodone with the patient. Also advised that Adderall can enhance the effect of trazodone potentially causing serotonin syndrome. I have reviewed the side effects of serotonin syndrome with the patient. She voiced understanding. Also advised that she should see her psychiatrist to discuss depression. She voiced understanding. She will follow-up in 6 months with Dr. Mechele Claude, MSN, NP-C 02/12/2017, 8:52 AM [facility] [address], [place], [place] [address] [phone]

## 2017-02-12 NOTE — Patient Instructions (Signed)
Continue Adderall and Nuvigil  Try trazodone 50 mg at bedtime for insomnia  Please be aware that Adderall and trazodone nad any other SSRI or SNRI together can sometimes cause a rare and potentially dangerous adverse reaction, called SEROTONIN SYNDROME: Symptoms of this condition include (but are not limited to):  Agitation or restlessness, confusion, rapid heart rate and high blood pressure, dilated pupils, loss of muscle coordination or twitching muscles, muscle rigidity/stiffness, sweating and/or flushing, diarrhea, headache, shivering, goose bumps. If you have any of these symptoms you may have to stop the medication. Call your health care provider immediately.  Severe serotonin syndrome can be life-threatening emergency. Signs and symptoms of a severe reaction may include: high fever, seizures, irregular heartbeat, unconsciousness or altered level of awareness or personality changes.  If you have any of these new symptoms, call 911 or have someone take you to the emergency room.   Trazodone tablets What is this medicine? TRAZODONE (TRAZ oh done) is used to treat depression. This medicine may be used for other purposes; ask your health care provider or pharmacist if you have questions. COMMON BRAND NAME(S): Desyrel What should I tell my health care provider before I take this medicine? They need to know if you have any of these conditions: -attempted suicide or thinking about it -bipolar disorder -bleeding problems -glaucoma -heart disease, or previous heart attack -irregular heart beat -kidney or liver disease -low levels of sodium in the blood -an unusual or allergic reaction to trazodone, other medicines, foods, dyes or preservatives -pregnant or trying to get pregnant -breast-feeding How should I use this medicine? Take this medicine by mouth with a glass of water. Follow the directions on the prescription label. Take this medicine shortly after a meal or a light snack. Take your  medicine at regular intervals. Do not take your medicine more often than directed. Do not stop taking this medicine suddenly except upon the advice of your doctor. Stopping this medicine too quickly may cause serious side effects or your condition may worsen. A special MedGuide will be given to you by the pharmacist with each prescription and refill. Be sure to read this information carefully each time. Talk to your pediatrician regarding the use of this medicine in children. Special care may be needed. Overdosage: If you think you have taken too much of this medicine contact a poison control center or emergency room at once. NOTE: This medicine is only for you. Do not share this medicine with others. What if I miss a dose? If you miss a dose, take it as soon as you can. If it is almost time for your next dose, take only that dose. Do not take double or extra doses. What may interact with this medicine? Do not take this medicine with any of the following medications: -certain medicines for fungal infections like fluconazole, itraconazole, ketoconazole, posaconazole, voriconazole -cisapride -dofetilide -dronedarone -linezolid -MAOIs like Carbex, Eldepryl, Marplan, Nardil, and Parnate -mesoridazine -methylene blue (injected into a vein) -pimozide -saquinavir -thioridazine -ziprasidone This medicine may also interact with the following medications: -alcohol -antiviral medicines for HIV or AIDS -aspirin and aspirin-like medicines -barbiturates like phenobarbital -certain medicines for blood pressure, heart disease, irregular heart beat -certain medicines for depression, anxiety, or psychotic disturbances -certain medicines for migraine headache like almotriptan, eletriptan, frovatriptan, naratriptan, rizatriptan, sumatriptan, zolmitriptan -certain medicines for seizures like carbamazepine and phenytoin -certain medicines for sleep -certain medicines that treat or prevent blood clots like  dalteparin, enoxaparin, warfarin -digoxin -fentanyl -lithium -NSAIDS, medicines for pain  and inflammation, like ibuprofen or naproxen -other medicines that prolong the QT interval (cause an abnormal heart rhythm) -rasagiline -supplements like St. John's wort, kava kava, valerian -tramadol -tryptophan This list may not describe all possible interactions. Give your health care provider a list of all the medicines, herbs, non-prescription drugs, or dietary supplements you use. Also tell them if you smoke, drink alcohol, or use illegal drugs. Some items may interact with your medicine. What should I watch for while using this medicine? Tell your doctor if your symptoms do not get better or if they get worse. Visit your doctor or health care professional for regular checks on your progress. Because it may take several weeks to see the full effects of this medicine, it is important to continue your treatment as prescribed by your doctor. Patients and their families should watch out for new or worsening thoughts of suicide or depression. Also watch out for sudden changes in feelings such as feeling anxious, agitated, panicky, irritable, hostile, aggressive, impulsive, severely restless, overly excited and hyperactive, or not being able to sleep. If this happens, especially at the beginning of treatment or after a change in dose, call your health care professional. Bonita QuinYou may get drowsy or dizzy. Do not drive, use machinery, or do anything that needs mental alertness until you know how this medicine affects you. Do not stand or sit up quickly, especially if you are an older patient. This reduces the risk of dizzy or fainting spells. Alcohol may interfere with the effect of this medicine. Avoid alcoholic drinks. This medicine may cause dry eyes and blurred vision. If you wear contact lenses you may feel some discomfort. Lubricating drops may help. See your eye doctor if the problem does not go away or is  severe. Your mouth may get dry. Chewing sugarless gum, sucking hard candy and drinking plenty of water may help. Contact your doctor if the problem does not go away or is severe. What side effects may I notice from receiving this medicine? Side effects that you should report to your doctor or health care professional as soon as possible: -allergic reactions like skin rash, itching or hives, swelling of the face, lips, or tongue -elevated mood, decreased need for sleep, racing thoughts, impulsive behavior -confusion -fast, irregular heartbeat -feeling faint or lightheaded, falls -feeling agitated, angry, or irritable -loss of balance or coordination -painful or prolonged erections -restlessness, pacing, inability to keep still -suicidal thoughts or other mood changes -tremors -trouble sleeping -seizures -unusual bleeding or bruising Side effects that usually do not require medical attention (report to your doctor or health care professional if they continue or are bothersome): -change in sex drive or performance -change in appetite or weight -constipation -headache -muscle aches or pains -nausea This list may not describe all possible side effects. Call your doctor for medical advice about side effects. You may report side effects to FDA at 1-800-FDA-1088. Where should I keep my medicine? Keep out of the reach of children. Store at room temperature between 15 and 30 degrees C (59 to 86 degrees F). Protect from light. Keep container tightly closed. Throw away any unused medicine after the expiration date. NOTE: This sheet is a summary. It may not cover all possible information. If you have questions about this medicine, talk to your doctor, pharmacist, or health care provider.  2018 Elsevier/Gold Standard (2016-02-10 16:57:05)

## 2017-03-04 ENCOUNTER — Other Ambulatory Visit: Payer: Self-pay | Admitting: Neurology

## 2017-03-04 DIAGNOSIS — F5101 Primary insomnia: Secondary | ICD-10-CM

## 2017-03-07 ENCOUNTER — Encounter: Payer: Self-pay | Admitting: Adult Health

## 2017-03-31 ENCOUNTER — Emergency Department (HOSPITAL_COMMUNITY)
Admission: EM | Admit: 2017-03-31 | Discharge: 2017-04-01 | Disposition: A | Discharge status: Home / Self Care | Payer: Managed Care, Other (non HMO) | Attending: Emergency Medicine | Admitting: Emergency Medicine

## 2017-03-31 ENCOUNTER — Encounter (HOSPITAL_COMMUNITY): Payer: Self-pay | Admitting: Emergency Medicine

## 2017-03-31 DIAGNOSIS — Z793 Long term (current) use of hormonal contraceptives: Secondary | ICD-10-CM | POA: Diagnosis not present

## 2017-03-31 DIAGNOSIS — J45909 Unspecified asthma, uncomplicated: Secondary | ICD-10-CM | POA: Diagnosis not present

## 2017-03-31 DIAGNOSIS — Z79899 Other long term (current) drug therapy: Secondary | ICD-10-CM | POA: Insufficient documentation

## 2017-03-31 DIAGNOSIS — R109 Unspecified abdominal pain: Secondary | ICD-10-CM

## 2017-03-31 DIAGNOSIS — Z7951 Long term (current) use of inhaled steroids: Secondary | ICD-10-CM | POA: Insufficient documentation

## 2017-03-31 DIAGNOSIS — R1011 Right upper quadrant pain: Secondary | ICD-10-CM | POA: Diagnosis not present

## 2017-03-31 LAB — URINALYSIS, ROUTINE W REFLEX MICROSCOPIC
Bacteria, UA: NONE SEEN
Bilirubin Urine: NEGATIVE
GLUCOSE, UA: NEGATIVE mg/dL
KETONES UR: NEGATIVE mg/dL
Nitrite: NEGATIVE
PH: 5 (ref 5.0–8.0)
Protein, ur: 100 mg/dL — AB
SPECIFIC GRAVITY, URINE: 1.017 (ref 1.005–1.030)

## 2017-03-31 LAB — POC URINE PREG, ED: Preg Test, Ur: NEGATIVE

## 2017-03-31 MED ORDER — SODIUM CHLORIDE 0.9 % IV BOLUS (SEPSIS)
1000.0000 mL | Freq: Once | INTRAVENOUS | Status: AC
  Administered 2017-04-01: 1000 mL via INTRAVENOUS

## 2017-03-31 MED ORDER — HYDROMORPHONE HCL 1 MG/ML IJ SOLN
1.0000 mg | Freq: Once | INTRAMUSCULAR | Status: AC
  Administered 2017-04-01: 1 mg via INTRAVENOUS
  Filled 2017-03-31: qty 1

## 2017-03-31 MED ORDER — ONDANSETRON HCL 4 MG/2ML IJ SOLN
4.0000 mg | Freq: Once | INTRAMUSCULAR | Status: AC
  Administered 2017-04-01: 4 mg via INTRAVENOUS
  Filled 2017-03-31: qty 2

## 2017-03-31 NOTE — ED Notes (Signed)
Bladder scan revealed 90mL.

## 2017-03-31 NOTE — ED Notes (Signed)
Pt from home with complaints of right flank pain that began yesterday. Pt states she has renal disorder causing recurrent stones. [place] the [place] when she felt the onset of flank pain. Pt also reports a burning sensation that travels to her groin. Pt reports pain with urination, but denies urination in the past 24 hours.

## 2017-04-01 ENCOUNTER — Emergency Department (HOSPITAL_COMMUNITY): Payer: Managed Care, Other (non HMO)

## 2017-04-01 LAB — COMPREHENSIVE METABOLIC PANEL
ALBUMIN: 3.6 g/dL (ref 3.5–5.0)
ALK PHOS: 72 U/L (ref 38–126)
ALT: 19 U/L (ref 14–54)
AST: 20 U/L (ref 15–41)
Anion gap: 6 (ref 5–15)
BILIRUBIN TOTAL: 0.2 mg/dL — AB (ref 0.3–1.2)
BUN: 12 mg/dL (ref 6–20)
CO2: 23 mmol/L (ref 22–32)
Calcium: 8.7 mg/dL — ABNORMAL LOW (ref 8.9–10.3)
Chloride: 110 mmol/L (ref 101–111)
Creatinine, Ser: 0.84 mg/dL (ref 0.44–1.00)
GFR calc Af Amer: >60 mL/min (ref >60)
GFR calc non Af Amer: >60 mL/min (ref >60)
GLUCOSE: 98 mg/dL (ref 65–99)
Potassium: 3.4 mmol/L — ABNORMAL LOW (ref 3.5–5.1)
SODIUM: 139 mmol/L (ref 135–145)
TOTAL PROTEIN: 7.4 g/dL (ref 6.5–8.1)

## 2017-04-01 LAB — CBC WITH DIFFERENTIAL/PLATELET
BASOS ABS: 0 10*3/uL (ref 0.0–0.1)
BASOS PCT: 0 %
Eosinophils Absolute: 0.1 10*3/uL (ref 0.0–0.7)
Eosinophils Relative: 1 %
HEMATOCRIT: 36.6 % (ref 36.0–46.0)
HEMOGLOBIN: 12 g/dL (ref 12.0–15.0)
Lymphocytes Relative: 33 %
Lymphs Abs: 2.6 10*3/uL (ref 0.7–4.0)
MCH: 27.8 pg (ref 26.0–34.0)
MCHC: 32.8 g/dL (ref 30.0–36.0)
MCV: 84.9 fL (ref 78.0–100.0)
Monocytes Absolute: 0.4 10*3/uL (ref 0.1–1.0)
Monocytes Relative: 5 %
NEUTROS ABS: 4.8 10*3/uL (ref 1.7–7.7)
NEUTROS PCT: 61 %
Platelets: 266 10*3/uL (ref 150–400)
RBC: 4.31 MIL/uL (ref 3.87–5.11)
RDW: 13 % (ref 11.5–15.5)
WBC: 7.8 10*3/uL (ref 4.0–10.5)

## 2017-04-01 MED ORDER — ONDANSETRON 4 MG PO TBDP: 4.0000 mg | ORAL_TABLET | Freq: Three times a day (TID) | ORAL | 0 refills | Status: DC | PRN

## 2017-04-01 MED ORDER — HYDROCODONE-ACETAMINOPHEN 5-325 MG PO TABS: 1.0000 | ORAL_TABLET | ORAL | 0 refills | Status: DC | PRN

## 2017-04-01 MED ORDER — ONDANSETRON 4 MG PO TBDP
4.0000 mg | ORAL_TABLET | Freq: Once | ORAL | Status: AC
  Administered 2017-04-01: 4 mg via ORAL
  Filled 2017-04-01: qty 1

## 2017-04-01 MED ORDER — OXYCODONE-ACETAMINOPHEN 5-325 MG PO TABS
2.0000 | ORAL_TABLET | Freq: Once | ORAL | Status: AC
  Administered 2017-04-01: 2 via ORAL
  Filled 2017-04-01: qty 2

## 2017-04-01 NOTE — ED Provider Notes (Signed)
[facility]-EMERGENCY DEPT Provider Note   CSN: [id number] Arrival date & time: 03/31/17  2042     History   Chief Complaint Chief Complaint  Patient presents with  . Flank Pain    right     HPI Cystal Vedia PereyraM Abood is a 31 y.o. female.  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 Patient states that her pain began acutely yesterday evening. The pain is localized to the right upper flank with associated radiation down towards her groin. She was seen by urgent care and given an anti-inflammatory which did improve her pain but she presents here for evaluation. She has ongoing intermittent aching right flank pain. The pain is worse with certain position changes. Denies any alleviating factors. She has had nausea but no vomiting. She's noticed decreased urine as well as urinary frequency. Denies any fevers. She has a chronic cough that has not acutely changed. No other medical complaints.  Past Medical History:  Diagnosis Date  . Anemia    takes Ferrous Sulfate daily  . Anxiety    takes Klonopin nightly  . ASTHMA NOS W/ACUTE EXACERBATION 01/05/2010   exercise induced  . Back pain   . Coarse tremors   . Degenerative disc disease    back  . Gallstones   . GERD (gastroesophageal reflux disease)    but doesn't take anything for it  . History of kidney stones   . History of migraine    last one a couple of days ago  . History of UTI    frequent  . IBS (irritable bowel syndrome)    doesn't take any meds  . Medullary sponge kidney 2007   kidneys can be sick but she isnt sick  . MIGRAINES, HX OF 06/24/2007  . Narcolepsy   . 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 OCD (obsessive compulsive disorder)    takes Prozac daily  . PYELONEPHRITIS 06/24/2007  . Seizures (HCC)    takes Lamictal daily;last seizure was in 11/14/10  . Urinary  frequency     Patient Active Problem List   Diagnosis Date Noted  . 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 S/P laparoscopic cholecystectomy 12/02/2013  . 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  . H/O abnormal cervical Papanicolaou smear 09/29/2013  . OCD (obsessive compulsive disorder)   . Convulsions/seizures (HCC) 04/21/2013  . Anxiety 04/21/2013  . 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 Chronic cough 10/17/2011  . 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 ASTHMA NOS W/ACUTE EXACERBATION 01/05/2010  . ABSENCE OF MENSTRUATION 10/27/2009  . MIGRAINES, HX OF 06/24/2007    Past Surgical History:  Procedure Laterality Date  . BACK SURGERY    . CHOLECYSTECTOMY N/A 12/02/2013   Procedure: LAPAROSCOPIC CHOLECYSTECTOMY WITH ATTEMPTED INTRAOPERATIVE CHOLANGIOGRAM;  Surgeon: Atilano InaEric M Wilson, MD;  [facility];  Service: General;  Laterality: N/A;  . LUMBAR LAMINECTOMY/DECOMPRESSION MICRODISCECTOMY  07/24/2012   Procedure: LUMBAR LAMINECTOMY/DECOMPRESSION MICRODISCECTOMY;  Surgeon: Jacki Conesonald A Gioffre, MD;  Location: [facility] ORS;  Service: Orthopedics;  Laterality: Right;  L5-S1  . WISDOM TOOTH EXTRACTION      OB History    Gravida Para Term Preterm AB Living   3 2 2  0 0 2   SAB TAB Ectopic Multiple Live Births   0 0 0 0 2       Home Medications    Prior to Admission medications  Medication Sig Start Date End Date Taking? Authorizing Provider  albuterol (PROVENTIL HFA;VENTOLIN HFA) 108 (90 Base) MCG/ACT inhaler Inhale 1-2 puffs into the lungs every 6 (six) hours as needed for wheezing or shortness of breath.   Yes [provider]  amphetamine-dextroamphetamine (ADDERALL XR) 30 MG 24 hr capsule Take 1 capsule (30 mg total) by mouth daily. 10/23/16  Yes Dohmeier, Porfirio Mylar, MD  amphetamine-dextroamphetamine (ADDERALL) 5 MG tablet Take 1 tablet PO at lunch if needed. Patient taking differently: Take 5 mg by mouth daily as needed (for narcolepsy).  10/23/16  Yes Dohmeier,  Porfirio Mylar, MD  EPINEPHrine (EPIPEN 2-PAK) 0.3 mg/0.3 mL IJ SOAJ injection Inject 0.3 mg into the muscle once as needed (for severe allergic reaction).   Yes [provider]  mometasone-formoterol (DULERA) 200-5 MCG/ACT AERO Inhale 2 puffs into the lungs 2 (two) times daily.   Yes [provider]  norgestrel-ethinyl estradiol (LO/OVRAL,CRYSELLE) 0.3-30 MG-MCG tablet Take 1 tablet by mouth at bedtime.    Yes [provider]  omeprazole (PRILOSEC) 20 MG capsule Take 20 mg by mouth daily as needed (for acid reflux).    Yes [provider]  traZODone (DESYREL) 50 MG tablet Take 1 tablet (50 mg total) by mouth at bedtime. 02/12/17  Yes Butch Penny, NP  HYDROcodone-acetaminophen (NORCO/VICODIN) 5-325 MG tablet Take 1-2 tablets by mouth every 4 (four) hours as needed for moderate pain or severe pain. 04/01/17   Shaune Pollack, MD  ondansetron (ZOFRAN ODT) 4 MG disintegrating tablet Take 1 tablet (4 mg total) by mouth every 8 (eight) hours as needed for nausea or vomiting. 04/01/17   Shaune Pollack, MD    Family History Family History  Problem Relation Age of Onset  . Heart disease Mother   . ADD / ADHD Sister   . Anesthesia problems Neg Hx     Social History Social History  Substance Use Topics  . Smoking status: Never Smoker  . Smokeless tobacco: Never Used  . Alcohol use No     Comment: occas. when not pregnant     Allergies   Other; Soy allergy; Sumatriptan; Zolmitriptan; Ciprofloxacin; Nsaids; Topiramate; Adhesive [tape]; and Triptans   Review of Systems Review of Systems  Constitutional: Positive for fatigue. Negative for chills and fever.  HENT: Negative for congestion, rhinorrhea and sore throat.   Eyes: Negative for visual disturbance.  Respiratory: Negative for cough, shortness of breath and wheezing.   Cardiovascular: Negative for chest pain and leg swelling.  Gastrointestinal: Negative for abdominal pain, diarrhea, nausea and vomiting.    Genitourinary: Positive for decreased urine volume, flank pain and frequency. Negative for dysuria, vaginal bleeding and vaginal discharge.  Musculoskeletal: Negative for neck pain.  Skin: Negative for rash.  Allergic/Immunologic: Negative for immunocompromised state.  Neurological: Negative for syncope and headaches.  Hematological: Does not bruise/bleed easily.  All other systems reviewed and are negative.    Physical Exam Updated Vital Signs BP (!) 118/58 (BP Location: Right Arm)   Pulse 71   Temp 98.1 F (36.7 C) (Oral)   Resp 20   Ht 5' (1.524 m)   Wt 100.7 kg (222 lb)   LMP 03/25/2017   SpO2 97%   BMI 43.36 kg/m   Physical Exam  Constitutional: She is oriented to person, place, and time. She appears well-developed and well-nourished. No distress.  HENT:  Head: Normocephalic and atraumatic.  Eyes: Conjunctivae are normal.  Neck: Neck supple.  Cardiovascular: Normal rate, regular rhythm and normal heart sounds.  Exam reveals  no friction rub.   No murmur heard. Pulmonary/Chest: Effort normal and breath sounds normal. No respiratory distress. She has no wheezes. She has no rales.  Abdominal: Soft. Bowel sounds are normal. She exhibits no distension. There is no tenderness. There is no rebound and no guarding.  Mild right CVAT, no RUQ TTP or Murphy's  Musculoskeletal: She exhibits no edema.  Neurological: She is alert and oriented to person, place, and time. She exhibits normal muscle tone.  Skin: Skin is warm. Capillary refill takes less than 2 seconds.  Psychiatric: She has a normal mood and affect.  Nursing note and vitals reviewed.    ED Treatments / Results  Labs (all labs ordered are listed, but only abnormal results are displayed) Labs Reviewed  URINALYSIS, ROUTINE W REFLEX MICROSCOPIC - Abnormal; Notable for the following:       Result Value   APPearance HAZY (*)    Hgb urine dipstick SMALL (*)    Protein, ur 100 (*)    Leukocytes, UA TRACE (*)     Squamous Epithelial / LPF 6-30 (*)    All other components within normal limits  COMPREHENSIVE METABOLIC PANEL - Abnormal; Notable for the following:    Potassium 3.4 (*)    Calcium 8.7 (*)    Total Bilirubin 0.2 (*)    All other components within normal limits  CBC WITH DIFFERENTIAL/PLATELET  POC URINE PREG, ED    EKG  EKG Interpretation None       Radiology Ct Renal Stone Study  Result Date: 04/01/2017 CLINICAL DATA:  Acute onset of right flank pain, and burning sensation extending to the groin. Dysuria. Initial encounter. EXAM: CT ABDOMEN AND PELVIS WITHOUT CONTRAST TECHNIQUE: Multidetector CT imaging of the abdomen and pelvis was performed following the standard protocol without IV contrast. COMPARISON:  CT of the abdomen and pelvis from 04/04/2014, and renal ultrasound performed 09/07/2014 FINDINGS: Lower chest: Minimal nodularity at the right lung base is stable from 2015 and likely reflects sequelae of remote infection. The visualized portions of the mediastinum are unremarkable. Hepatobiliary: The liver is unremarkable in appearance. The patient is status post cholecystectomy, with clips noted at the gallbladder fossa. The common bile duct remains normal in caliber. Pancreas: The pancreas is within normal limits. Spleen: The spleen is unremarkable in appearance. Adrenals/Urinary Tract: The adrenal glands are unremarkable in appearance. The kidneys are within normal limits. There is no evidence of hydronephrosis. No renal or ureteral stones are identified. No perinephric stranding is seen. Stomach/Bowel: The stomach is unremarkable in appearance. The small bowel is within normal limits. The appendix is normal in caliber, without evidence of appendicitis. The colon is unremarkable in appearance. Vascular/Lymphatic: The abdominal aorta is unremarkable in appearance. The inferior vena cava is grossly unremarkable. No retroperitoneal lymphadenopathy is seen. No pelvic sidewall lymphadenopathy  is identified. Reproductive: The bladder is mildly distended and within normal limits. The uterus is grossly unremarkable in appearance. The ovaries are relatively symmetric. No suspicious adnexal masses are seen. Other: No additional soft tissue abnormalities are seen. Musculoskeletal: No acute osseous abnormalities are identified. A chronic right-sided pars defect is noted at L5, without evidence of anterolisthesis. The visualized musculature is unremarkable in appearance. IMPRESSION: 1. No acute abnormality seen within the abdomen or pelvis. 2. 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 Electronically Signed   By: Roanna Raider M.D.   On: 04/01/2017 01:10    Procedures Procedures (including critical care time)  Medications Ordered in ED Medications  sodium chloride 0.9 % bolus 1,000 mL (0 mLs Intravenous Stopped 04/01/17 0140)  HYDROmorphone (DILAUDID) injection 1 mg (1 mg Intravenous Given 04/01/17 0042)  ondansetron (ZOFRAN) injection 4 mg (4 mg Intravenous Given 04/01/17 0041)  oxyCODONE-acetaminophen (PERCOCET/ROXICET) 5-325 MG per tablet 2 tablet (2 tablets Oral Given 04/01/17 0223)  ondansetron (ZOFRAN-ODT) disintegrating tablet 4 mg (4 mg Oral Given 04/01/17 0223)     Initial Impression / Assessment and Plan / ED Course  I have reviewed the triage vital signs and the nursing notes.  Pertinent labs & imaging results that were available during my care of the patient were reviewed by me and considered in my medical decision making (see chart for details).    31 yo F with h/o medullary sponge kidney, recurrent flank pain, recurrent nephrolithiasis here with acute flank pain. Diagnosed with kidney stone at UC prior to arrival. Lab work is overall very reassuring. Renal function normal. No WBC or signs of infection or sepsis. UA with hematuria c/w stone, but CT scan shows no acute abnormality. Suspect recently passed stone vs acute on chronic flank pain 2/2 kidney  disease versus MSK abd wall pain. No lower abdominal pain or s/s to suggest PID, TOA, or torsion. D/c with supportive care.  Final Clinical Impressions(s) / ED Diagnoses   Final diagnoses:  Flank pain    New Prescriptions Discharge Medication List as of 04/01/2017  3:16 AM    START taking these medications   Details  HYDROcodone-acetaminophen (NORCO/VICODIN) 5-325 MG tablet Take 1-2 tablets by mouth every 4 (four) hours as needed for moderate pain or severe pain., Starting Sun 04/01/2017, Print    ondansetron (ZOFRAN ODT) 4 MG disintegrating tablet Take 1 tablet (4 mg total) by mouth every 8 (eight) hours as needed for nausea or vomiting., Starting Sun 04/01/2017, Print         Shaune Pollack, MD 04/01/17 1725

## 2017-04-11 ENCOUNTER — Other Ambulatory Visit: Payer: Self-pay | Admitting: Neurology

## 2017-04-11 DIAGNOSIS — G4710 Hypersomnia, unspecified: Secondary | ICD-10-CM

## 2017-04-11 DIAGNOSIS — F988 Other specified behavioral and emotional disorders with onset usually occurring in childhood and adolescence: Secondary | ICD-10-CM

## 2017-04-13 MED ORDER — AMPHETAMINE-DEXTROAMPHET ER 30 MG PO CP24: 30.0000 mg | ORAL_CAPSULE | Freq: Every day | ORAL | 0 refills | Status: DC

## 2017-04-13 NOTE — Telephone Encounter (Signed)
LVM informing patient the adderall Rx is at front desk for pick up. Advised her the office closes at noon today, opens at 8 am Monday.

## 2017-04-23 ENCOUNTER — Ambulatory Visit (HOSPITAL_COMMUNITY): Payer: Self-pay | Admitting: Psychiatry

## 2017-05-08 ENCOUNTER — Telehealth (HOSPITAL_COMMUNITY): Payer: Self-pay

## 2017-05-09 ENCOUNTER — Ambulatory Visit (HOSPITAL_COMMUNITY): Payer: Self-pay | Admitting: Psychiatry

## 2017-06-27 ENCOUNTER — Encounter: Payer: Self-pay | Admitting: Gastroenterology

## 2017-08-08 ENCOUNTER — Emergency Department (HOSPITAL_COMMUNITY)
Admission: EM | Admit: 2017-08-08 | Discharge: 2017-08-08 | Disposition: A | Discharge status: Home / Self Care | Payer: Managed Care, Other (non HMO) | Attending: Emergency Medicine | Admitting: Emergency Medicine

## 2017-08-08 ENCOUNTER — Emergency Department (HOSPITAL_COMMUNITY): Payer: Managed Care, Other (non HMO)

## 2017-08-08 DIAGNOSIS — J45909 Unspecified asthma, uncomplicated: Secondary | ICD-10-CM | POA: Insufficient documentation

## 2017-08-08 DIAGNOSIS — Z79899 Other long term (current) drug therapy: Secondary | ICD-10-CM | POA: Diagnosis not present

## 2017-08-08 DIAGNOSIS — R0789 Other chest pain: Secondary | ICD-10-CM | POA: Diagnosis present

## 2017-08-08 LAB — COMPREHENSIVE METABOLIC PANEL
ALBUMIN: 3.7 g/dL (ref 3.5–5.0)
ALK PHOS: 74 U/L (ref 38–126)
ALT: 23 U/L (ref 14–54)
ANION GAP: 9 (ref 5–15)
AST: 21 U/L (ref 15–41)
BUN: 9 mg/dL (ref 6–20)
CHLORIDE: 107 mmol/L (ref 101–111)
CO2: 23 mmol/L (ref 22–32)
CREATININE: 0.85 mg/dL (ref 0.44–1.00)
Calcium: 9.3 mg/dL (ref 8.9–10.3)
GFR calc Af Amer: >60 mL/min (ref >60)
GFR calc non Af Amer: >60 mL/min (ref >60)
Glucose, Bld: 101 mg/dL — ABNORMAL HIGH (ref 65–99)
Potassium: 3.9 mmol/L (ref 3.5–5.1)
SODIUM: 139 mmol/L (ref 135–145)
Total Bilirubin: 0.2 mg/dL — ABNORMAL LOW (ref 0.3–1.2)
Total Protein: 7.9 g/dL (ref 6.5–8.1)

## 2017-08-08 LAB — CBC WITH DIFFERENTIAL/PLATELET
Basophils Absolute: 0 10*3/uL (ref 0.0–0.1)
Basophils Relative: 1 %
EOS PCT: 1 %
Eosinophils Absolute: 0.1 10*3/uL (ref 0.0–0.7)
HCT: 39.5 % (ref 36.0–46.0)
Hemoglobin: 13.1 g/dL (ref 12.0–15.0)
LYMPHS ABS: 1.6 10*3/uL (ref 0.7–4.0)
Lymphocytes Relative: 25 %
MCH: 28.9 pg (ref 26.0–34.0)
MCHC: 33.2 g/dL (ref 30.0–36.0)
MCV: 87 fL (ref 78.0–100.0)
MONO ABS: 0.3 10*3/uL (ref 0.1–1.0)
MONOS PCT: 4 %
Neutro Abs: 4.2 10*3/uL (ref 1.7–7.7)
Neutrophils Relative %: 69 %
Platelets: 343 10*3/uL (ref 150–400)
RBC: 4.54 MIL/uL (ref 3.87–5.11)
RDW: 13.5 % (ref 11.5–15.5)
WBC: 6.1 10*3/uL (ref 4.0–10.5)

## 2017-08-08 LAB — URINALYSIS, ROUTINE W REFLEX MICROSCOPIC
BILIRUBIN URINE: NEGATIVE
Glucose, UA: NEGATIVE mg/dL
Ketones, ur: NEGATIVE mg/dL
Nitrite: NEGATIVE
Protein, ur: NEGATIVE mg/dL
Specific Gravity, Urine: 1.024 (ref 1.005–1.030)
pH: 5 (ref 5.0–8.0)

## 2017-08-08 LAB — LIPASE, BLOOD: Lipase: 21 U/L (ref 11–51)

## 2017-08-08 LAB — PREGNANCY, URINE: Preg Test, Ur: NEGATIVE

## 2017-08-08 MED ORDER — IOPAMIDOL (ISOVUE-370) INJECTION 76%
100.0000 mL | Freq: Once | INTRAVENOUS | Status: AC | PRN
  Administered 2017-08-08: 100 mL via INTRAVENOUS

## 2017-08-08 MED ORDER — MORPHINE SULFATE (PF) 4 MG/ML IV SOLN
4.0000 mg | Freq: Once | INTRAVENOUS | Status: AC
  Administered 2017-08-08: 4 mg via INTRAVENOUS
  Filled 2017-08-08: qty 1

## 2017-08-08 MED ORDER — ONDANSETRON HCL 4 MG/2ML IJ SOLN
4.0000 mg | Freq: Once | INTRAMUSCULAR | Status: AC
  Administered 2017-08-08: 4 mg via INTRAVENOUS
  Filled 2017-08-08: qty 2

## 2017-08-08 MED ORDER — DEXAMETHASONE SODIUM PHOSPHATE 10 MG/ML IJ SOLN
10.0000 mg | Freq: Once | INTRAMUSCULAR | Status: AC
  Administered 2017-08-08: 10 mg via INTRAVENOUS
  Filled 2017-08-08: qty 1

## 2017-08-08 MED ORDER — HYDROCODONE-ACETAMINOPHEN 5-325 MG PO TABS: 1.0000 | ORAL_TABLET | ORAL | 0 refills | Status: DC | PRN

## 2017-08-08 MED ORDER — IOPAMIDOL (ISOVUE-370) INJECTION 76%
INTRAVENOUS | Status: AC
  Filled 2017-08-08: qty 100

## 2017-08-08 MED ORDER — HYDROMORPHONE HCL 1 MG/ML IJ SOLN
1.0000 mg | Freq: Once | INTRAMUSCULAR | Status: AC
  Administered 2017-08-08: 1 mg via INTRAVENOUS
  Filled 2017-08-08: qty 1

## 2017-08-08 MED ORDER — SODIUM CHLORIDE 0.9 % IV BOLUS (SEPSIS)
1000.0000 mL | Freq: Once | INTRAVENOUS | Status: AC
  Administered 2017-08-08: 1000 mL via INTRAVENOUS

## 2017-08-08 NOTE — ED Triage Notes (Signed)
Pt is coming from home with complaints of left sided back pain for a week. Pt stated that last night pain worsened and is now radiating to her abdomen. Pt rates pain 8/10 and it worsens with deep breathing and coughing. Pt has nausea but denies vomiting and diarrhea.

## 2017-08-08 NOTE — ED Provider Notes (Signed)
[facility]-EMERGENCY DEPT Provider Note   CSN: [id number] Arrival date & time: 08/08/17  0754     History   Chief Complaint Chief Complaint  Patient presents with  . Left Side Pain    HPI [name] is a 31 y.o. female.  Pt presents to the ED today with left sided chest pain.  Pt said sx have been going on for about a week.  She has had a cough as well.  It hurts to move and it hurts to cough.  She did see her pcp who gave her a rx for hydrocodone syrup and an abx (?name).  She said sx have not improved.  Pt denies any f/c.  Cough is nonproductive.      Past Medical History:  Diagnosis Date  . Anemia    takes Ferrous Sulfate daily  . Anxiety    takes Klonopin nightly  . ASTHMA NOS W/ACUTE EXACERBATION 01/05/2010   exercise induced  . Back pain   . Coarse tremors   . Degenerative disc disease    back  . Gallstones   . GERD (gastroesophageal reflux disease)    but doesn't take anything for it  . History of kidney stones   . History of migraine    last one a couple of days ago  . History of UTI    frequent  . IBS (irritable bowel syndrome)    doesn't take any meds  . Medullary sponge kidney 2007   kidneys can be sick but she isnt sick  . MIGRAINES, HX OF 06/24/2007  . Narcolepsy   . 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 OCD (obsessive compulsive disorder)    takes Prozac daily  . PYELONEPHRITIS 06/24/2007  . Seizures (HCC)    takes Lamictal daily;last seizure was in 11/14/10  . Urinary frequency     Patient Active Problem List   Diagnosis Date Noted  . 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 S/P laparoscopic cholecystectomy 12/02/2013  . 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  . H/O abnormal cervical Papanicolaou smear 09/29/2013  . OCD (obsessive compulsive disorder)   . Convulsions/seizures (HCC) 04/21/2013  . Anxiety 04/21/2013  . 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 Chronic cough 10/17/2011  . 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 ASTHMA NOS W/ACUTE EXACERBATION 01/05/2010  . ABSENCE OF MENSTRUATION 10/27/2009  . MIGRAINES, HX OF 06/24/2007    Past Surgical History:  Procedure Laterality Date  . BACK SURGERY    . WISDOM TOOTH EXTRACTION      OB History    Gravida Para Term Preterm AB Living   3 2 2  0 0 2   SAB TAB Ectopic Multiple Live Births   0 0 0 0 2       Home Medications    Prior to Admission medications   Medication Sig Start Date End Date Taking? Authorizing Provider  albuterol (PROVENTIL HFA;VENTOLIN HFA) 108 (90 Base) MCG/ACT inhaler Inhale 1-2 puffs into the lungs every 6 (six) hours as needed for wheezing or shortness of breath.   Yes [provider]  EPINEPHrine (EPIPEN 2-PAK) 0.3 mg/0.3 mL IJ SOAJ injection Inject 0.3 mg into the muscle once as needed (for severe allergic reaction).   Yes [provider]  HYDROcodone-homatropine (HYCODAN) 5-1.5 MG/5ML syrup Take 5 mLs every 6 (six) hours as needed by mouth for cough.   Yes [provider]  mometasone-formoterol (DULERA) 200-5 MCG/ACT AERO Inhale 1 puff 2 (two) times daily into the lungs.    Yes  [provider]  norgestrel-ethinyl estradiol (LO/OVRAL,CRYSELLE) 0.3-30 MG-MCG tablet Take 1 tablet by mouth at bedtime.    Yes [provider]  traZODone (DESYREL) 50 MG tablet Take 1 tablet (50 mg total) by mouth at bedtime. 02/12/17  Yes Butch PennyMillikan, Megan, NP  amphetamine-dextroamphetamine (ADDERALL XR) 30 MG 24 hr capsule Take 1 capsule (30 mg total) by mouth daily. Patient not taking: Reported on 08/08/2017 04/13/17   Dohmeier, Porfirio Mylararmen, MD  amphetamine-dextroamphetamine (ADDERALL) 5 MG tablet Take 1 tablet PO at lunch if needed. Patient not taking: Reported on 08/08/2017 10/23/16   Dohmeier, Porfirio Mylararmen, MD  HYDROcodone-acetaminophen (NORCO/VICODIN) 5-325 MG tablet Take 1 tablet every 4 (four) hours as needed by mouth. 08/08/17   Jacalyn LefevreHaviland, Uva Runkel, MD  ondansetron (ZOFRAN ODT) 4 MG disintegrating  tablet Take 1 tablet (4 mg total) by mouth every 8 (eight) hours as needed for nausea or vomiting. Patient not taking: Reported on 08/08/2017 04/01/17   Shaune PollackIsaacs, Cameron, MD    Family History Family History  Problem Relation Age of Onset  . Heart disease Mother   . ADD / ADHD Sister   . Anesthesia problems Neg Hx     Social History Social History   Tobacco Use  . Smoking status: Never Smoker  . Smokeless tobacco: Never Used  Substance Use Topics  . Alcohol use: No    Alcohol/week: 0.0 oz    Comment: occas. when not pregnant  . Drug use: No     Allergies   Other; Soy allergy; Sumatriptan; Zolmitriptan; Ciprofloxacin; Nsaids; Topiramate; Adhesive [tape]; and Triptans   Review of Systems Review of Systems  Respiratory: Positive for cough.   Musculoskeletal:       Left chest wall pain  All other systems reviewed and are negative.    Physical Exam Updated Vital Signs BP 139/78 (BP Location: Left Arm)   Pulse 97   Temp 98.2 F (36.8 C) (Oral)   Resp (!) 22   Ht 5' (1.524 m)   Wt 93 kg (205 lb)   LMP 07/09/2017   SpO2 100%   BMI 40.04 kg/m   Physical Exam  Constitutional: She is oriented to person, place, and time. She appears well-developed and well-nourished.  HENT:  Head: Normocephalic and atraumatic.  Right Ear: External ear normal.  Left Ear: External ear normal.  Nose: Nose normal.  Mouth/Throat: Oropharynx is clear and moist.  Eyes: Conjunctivae and EOM are normal. Pupils are equal, round, and reactive to light.  Neck: Normal range of motion. Neck supple.  Cardiovascular: Normal rate, regular rhythm, normal heart sounds and intact distal pulses.  Pulmonary/Chest: Effort normal and breath sounds normal.  Left sided posterior and anterior chest wall tenderness beneath bra line.  No rash.  Abdominal: Soft. Bowel sounds are normal.  Musculoskeletal: Normal range of motion.  Neurological: She is alert and oriented to person, place, and time.  Skin: Skin is  warm. Capillary refill takes less than 2 seconds.  Psychiatric: She has a normal mood and affect. Her behavior is normal. Judgment and thought content normal.  Nursing note and vitals reviewed.    ED Treatments / Results  Labs (all labs ordered are listed, but only abnormal results are displayed) Labs Reviewed  COMPREHENSIVE METABOLIC PANEL - Abnormal; Notable for the following components:      Result Value   Glucose, Bld 101 (*)    Total Bilirubin 0.2 (*)    All other components within normal limits  URINALYSIS, ROUTINE W REFLEX MICROSCOPIC - Abnormal; Notable for  the following components:   APPearance HAZY (*)    Hgb urine dipstick SMALL (*)    Leukocytes, UA TRACE (*)    Bacteria, UA MANY (*)    Squamous Epithelial / LPF 6-30 (*)    All other components within normal limits  LIPASE, BLOOD  CBC WITH DIFFERENTIAL/PLATELET  PREGNANCY, URINE    EKG  EKG Interpretation None       Radiology Dg Chest 2 View  Result Date: 08/08/2017 CLINICAL DATA:  Month of cough with increased severity over the past 2 days with development of productive cough, left chest and flank pain, and chills. History of right-sided pneumonia 1 year ago. Never smoked. EXAM: CHEST  2 VIEW COMPARISON:  Chest x-ray of November 24, 2013 FINDINGS: The lungs are well-expanded. There is no focal infiltrate. There is no pleural effusion. The heart and pulmonary vascularity are normal. The mediastinum is normal in width. The bony thorax exhibits no acute abnormality. IMPRESSION: There is no pneumonia nor other acute cardiopulmonary abnormality. Electronically Signed   By: David  SwazilandJordan M.D.   On: 08/08/2017 09:33   Ct Angio Chest Pe W And/or Wo Contrast  Result Date: 08/08/2017 CLINICAL DATA:  Cough for 2 weeks. History of pneumonia for a year. Chest pressure. EXAM: CT ANGIOGRAPHY CHEST WITH CONTRAST TECHNIQUE: Multidetector CT imaging of the chest was performed using the standard protocol during bolus administration  of intravenous contrast. Multiplanar CT image reconstructions and MIPs were obtained to evaluate the vascular anatomy. CONTRAST:  100mL ISOVUE-370 IOPAMIDOL (ISOVUE-370) INJECTION 76% COMPARISON:  Chest x-ray from earlier today FINDINGS: Cardiovascular: Satisfactory opacification of the pulmonary arteries to the segmental level. No evidence of pulmonary embolism. Normal heart size. No pericardial effusion. Mediastinum/Nodes: Negative for adenopathy or mass. Lungs/Pleura: There is a small cluster of micronodules in the posterior costophrenic sulcus on the right. These are stable from abdominal CT 04/01/2017 and are most likely scarring/chronic endobronchial impaction. No associated abnormal vessel. 3 mm nodule along the minor fissure most consistent with lymph node. Upper Abdomen: Negative Musculoskeletal: Negative Review of the MIP images confirms the above findings. IMPRESSION: 1. Negative for pulmonary embolism or other acute finding. 2. Chronic clustered micronodules in the right posterior costophrenic sulcus, likely scarring. Electronically Signed   By: Marnee SpringJonathon  Watts M.D.   On: 08/08/2017 10:30    Procedures Procedures (including critical care time)  Medications Ordered in ED Medications  iopamidol (ISOVUE-370) 76 % injection (not administered)  dexamethasone (DECADRON) injection 10 mg (not administered)  sodium chloride 0.9 % bolus 1,000 mL (0 mLs Intravenous Stopped 08/08/17 0956)  morphine 4 MG/ML injection 4 mg (4 mg Intravenous Given 08/08/17 0842)  ondansetron (ZOFRAN) injection 4 mg (4 mg Intravenous Given 08/08/17 0842)  iopamidol (ISOVUE-370) 76 % injection 100 mL (100 mLs Intravenous Contrast Given 08/08/17 1007)  HYDROmorphone (DILAUDID) injection 1 mg (1 mg Intravenous Given 08/08/17 0957)     Initial Impression / Assessment and Plan / ED Course  I have reviewed the triage vital signs and the nursing notes.  Pertinent labs & imaging results that were available during my care of  the patient were reviewed by me and considered in my medical decision making (see chart for details).    Pt is feeling better.  Pt does not have PNA or PE.  The pt is stable for d/c.  Final Clinical Impressions(s) / ED Diagnoses   Final diagnoses:  Anterior chest wall pain    ED Discharge Orders  Ordered    HYDROcodone-acetaminophen (NORCO/VICODIN) 5-325 MG tablet  Every 4 hours PRN     08/08/17 1039       Jacalyn Lefevre, MD 08/08/17 1041

## 2017-08-13 ENCOUNTER — Ambulatory Visit (INDEPENDENT_AMBULATORY_CARE_PROVIDER_SITE_OTHER): Payer: Managed Care, Other (non HMO) | Admitting: Neurology

## 2017-08-13 ENCOUNTER — Encounter: Payer: Self-pay | Admitting: Neurology

## 2017-08-13 VITALS — BP 134/87 | HR 98 | Ht 61.0 in | Wt 213.0 lb

## 2017-08-13 DIAGNOSIS — Z6841 Body Mass Index (BMI) 40.0 and over, adult: Secondary | ICD-10-CM | POA: Diagnosis not present

## 2017-08-13 DIAGNOSIS — G47411 Narcolepsy with cataplexy: Secondary | ICD-10-CM | POA: Insufficient documentation

## 2017-08-13 DIAGNOSIS — G4710 Hypersomnia, unspecified: Secondary | ICD-10-CM | POA: Diagnosis not present

## 2017-08-13 DIAGNOSIS — F988 Other specified behavioral and emotional disorders with onset usually occurring in childhood and adolescence: Secondary | ICD-10-CM

## 2017-08-13 MED ORDER — AMPHETAMINE-DEXTROAMPHET ER 30 MG PO CP24: 30.0000 mg | ORAL_CAPSULE | Freq: Every day | ORAL | 0 refills | Status: DC

## 2017-08-13 NOTE — Progress Notes (Signed)
PATIENT: [name] DOB: [birth year]  REASON FOR VISIT: follow up- narcolepsy HISTORY FROM: patient  HISTORY OF PRESENT ILLNESS:  Interval history from 13 August 2017.  I have the pleasure of seeing [name] today, meanwhile 31 years old, right-handed Caucasian female married mother of 2.  [name] had some positive news over the last 6 months, her stress level and therefore her depression has much improved.  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  She could not tolerate armodafinil as generic or brand name Nuvigil because of frequent migraines.  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 Since she is starting a new job this month I would like for her to return to Adderall which according to her did not give her migraines.  She endorsed today a high-grade of fatigue at 57 points out of 63 possible points, and the Epworth Sleepiness Scale at 16 points out of 24 possible points.  He also reports no recent cataplectic attacks but this seems to be mainly due to medication that could control cataplexy as well as to her own tendency to restrain her emotion.   Today 02/12/2017 [name] is a 31 year old female with a history of narcolepsy. She returns today for follow-up. She still states that she has significant fatigue throughout the day. She states that "on the inside she feels fatigued and wants to nap but the medication will not let her go to sleep." She initially denies any depression but by the end of the visit she is very tearful and crying. Reports that she does not have a good support system with her parents. She is now reporting that she actually may be suffering from some mild depression. Reports that she is no longer taking Cymbalta or amitriptyline. She states that she's been taking Adderall on an as-needed basis that she  is not working at this time. She does take Nuvigil every morning at 7 AM. She states that she usually does well throughout the day until 5 PM. She was being seen at [facility] for chronic regional pain syndrome. She thought that we could see her for the same problem. She reports that since she started Remeron she has gained a significant amount of weight and dreams all night long. She reports that she actually has a decreased appetite and eats very little despite weight gain. Denies any swelling in the extremities. In the past she was seeing a psychiatrist but has not followed up since she was diagnosed with narcolepsy.  HISTORY Interval history from 08/15/2016, [name], meanwhile 37 year old mother of 2 is seen here today for increasing and excessive daytime sleepiness. The patient has a history of seizures. She has not had a seizure in over 24 month. She used to be on Lamictal which also was used for mood stabilization helping his depression, but she is now off. She uses gabapentin and Cymbalta for work related back injury /shoulder injury on the right. She continues to take Adderall but with less effect to control her daytime sleepiness. The Epworth sleepiness score was endorsed at 18 points today fatigue severity at 59 points, both very high. 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 Due to her comorbidities and medication needs, an M SLT would be deemed not valid. The patient cannot wean off her medication in preparation for this test  REVIEW OF SYSTEMS: Out of a complete 14 system review of symptoms, the patient complains only of the following symptoms, and all other reviewed systems are negative.  Emotional restraint- "if I laugh or cry my knees buckle". Pleuritic pain and kidney infection, causing weight loss, fever, fatigue, chills,  environmental allergies.  ALLERGIES: Allergies  Allergen Reactions  . Other Anaphylaxis and Other (See Comments)    Pt is allergic to balsamic  vinaigrette.     . Soy Allergy Anaphylaxis  . Sumatriptan Rash  . Zolmitriptan Rash  . Ciprofloxacin Other (See Comments)    Reaction:  Burning of pts arm  . Nsaids Other (See Comments)    Pt states that she was told to avoid this class of medication.    . Topiramate Other (See Comments)    Reaction:  Unknown   . Adhesive [Tape] Itching and Rash  . Triptans Rash    HOME MEDICATIONS: Outpatient Medications Prior to Visit  Medication Sig Dispense Refill  . albuterol (PROVENTIL HFA;VENTOLIN HFA) 108 (90 Base) MCG/ACT inhaler Inhale 1-2 puffs into the lungs every 6 (six) hours as needed for wheezing or shortness of breath.    . EPINEPHrine (EPIPEN 2-PAK) 0.3 mg/0.3 mL IJ SOAJ injection Inject 0.3 mg into the muscle once as needed (for severe allergic reaction).    Marland Kitchen HYDROcodone-acetaminophen (NORCO/VICODIN) 5-325 MG tablet Take 1 tablet every 4 (four) hours as needed by mouth. 10 tablet 0  . HYDROcodone-homatropine (HYCODAN) 5-1.5 MG/5ML syrup Take 5 mLs every 6 (six) hours as needed by mouth for cough.    . mometasone-formoterol (DULERA) 200-5 MCG/ACT AERO Inhale 1 puff 2 (two) times daily into the lungs.     . norgestrel-ethinyl estradiol (LO/OVRAL,CRYSELLE) 0.3-30 MG-MCG tablet Take 1 tablet by mouth at bedtime.     . ondansetron (ZOFRAN ODT) 4 MG disintegrating tablet Take 1 tablet (4 mg total) by mouth every 8 (eight) hours as needed for nausea or vomiting. 20 tablet 0  . sulfamethoxazole-trimethoprim (BACTRIM DS,SEPTRA DS) 800-160 MG tablet Take 1 tablet 2 (two) times daily by mouth.    . traZODone (DESYREL) 50 MG tablet Take 1 tablet (50 mg total) by mouth at bedtime. 30 tablet 5  . amphetamine-dextroamphetamine (ADDERALL XR) 30 MG 24 hr capsule Take 1 capsule (30 mg total) by mouth daily. (Patient not taking: Reported on 08/08/2017) 30 capsule 0  . amphetamine-dextroamphetamine (ADDERALL) 5 MG tablet Take 1 tablet PO at lunch if needed. (Patient not taking: Reported on 08/08/2017) 30  tablet 0   No facility-administered medications prior to visit.     PAST MEDICAL HISTORY: Past Medical History:  Diagnosis Date  . Anemia    takes Ferrous Sulfate daily  . Anxiety    takes Klonopin nightly  . ASTHMA NOS W/ACUTE EXACERBATION 01/05/2010   exercise induced  . Back pain   . Coarse tremors   . Degenerative disc disease    back  . Gallstones   . GERD (gastroesophageal reflux disease)    but doesn't take anything for it  . History of kidney stones   . History of migraine    last one a couple of days ago  . History of UTI    frequent  . IBS (irritable bowel syndrome)    doesn't take any meds  . Medullary sponge kidney 2007   kidneys can be sick but she isnt sick  . MIGRAINES, HX OF 06/24/2007  . Narcolepsy   . 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 OCD (obsessive compulsive disorder)  takes Prozac daily  . PYELONEPHRITIS 06/24/2007  . Seizures ([place])    takes Lamictal daily;last seizure was in 11/14/10  . Urinary frequency     PAST SURGICAL HISTORY: Past Surgical History:  Procedure Laterality Date  . BACK SURGERY    . LAPAROSCOPIC CHOLECYSTECTOMY WITH ATTEMPTED INTRAOPERATIVE CHOLANGIOGRAM N/A 12/02/2013   Performed by [name], MD at [place] MICRODISCECTOMY Right 07/24/2012   Performed by Tobi Bastos, MD at [facility]S  . WISDOM TOOTH EXTRACTION      FAMILY HISTORY: Family History  Problem Relation Age of Onset  . Heart disease Mother   . ADD / ADHD Sister   . Anesthesia problems Neg Hx     SOCIAL HISTORY: Social History   Socioeconomic History  . Marital status: Married    Spouse name: Not on file  . Number of children: 2  . Years of education: 34  . Highest education level: Not on file  Social Needs  . Financial resource strain: Not on file  . Food insecurity - worry: Not on file  . Food insecurity - inability: Not on file  . Transportation needs - medical: Not on file  . Transportation needs - non-medical: Not  on file  Occupational History  . 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 Smoking status: Never Smoker  . Smokeless tobacco: Never Used  Substance and Sexual Activity  . Alcohol use: No    Alcohol/week: 0.0 oz    Comment: occas. when not pregnant  . Drug use: No  . Sexual activity: Yes    Partners: Male    Comment: last sex  Aug 22 2014  Other Topics Concern  . Not on file  Social History Narrative   HSG, finished 2 years of college. single mom with 2 sons blake - Aug '09, Elijah Dec '12. 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 Still lives with father of her sons with wedding plans on hold ( Jan '13)      PHYSICAL EXAM  Vitals:   08/13/17 1304  BP: 134/87  Pulse: 98  Weight: 213 lb (96.6 kg)  Height: _0  (1.549 m)   Body mass index is 40.25 kg/m.  General: The patient is awake, alert and appears not in acute distress. The patient has dental defects.  Head: Normocephalic, atraumatic. Neck is supple. Mallampati 3 with very large tonsils bilaterally crowding the airway , neck circumference is 15.5 inches.  Cardiovascular:  Regular rate and rhythm , without  murmurs or carotid bruit, and without distended neck veins. Respiratory: Lungs are clear to auscultation. Skin:  Without evidence of edema, or rash Trunk: BMI remains elevated and patient  has normal posture.   Neurologic exam : The patient is awake and alert, oriented to place and time.  Memory subjective described as intact. There is a normal attention span & concentration ability. Speech is fluent without  dysarthria, dysphonia or aphasia. Mood and affect are appropriate.  Cranial nerves: the patient reports a blunted sense of smell, and taste.  Pupils are equal and briskly reactive to light. Funduscopic exam without evidence of pallor or edema. Extraocular movements  in vertical and horizontal planes intact and  without nystagmus. Visual fields by finger perimetry are intact. Hearing to finger rub intact.  Facial sensation intact to fine touch. Facial motor strength is symmetric and  tongue and uvula move midline. Motor exam: Normal tone and normal muscle bulk and symmetric normal strength in all extremities. Sensory:  Fine touch, pinprick and vibration were tested in all extremities. Proprioception is normal. Coordination: Rapid alternating movements in the fingers/hands is tested and normal. Finger-to-nose maneuver tested and normal without evidence of ataxia, dysmetria or tremor. Gait and station: Patient walks without assistive device and is able and assisted stool climb up to the exam table. Strength within normal limits.  Stance is stable and normal. Tandem gait is intact, turns with 3 Steps are unfragmented. Romberg testing is normal.  Deep tendon reflexes: in the  upper and lower extremities are symmetric and intact. Babinski maneuver response is downgoing.   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  At this time she would be untreated for narcolepsy as she had stopped taking modafinil out of a concerned that it increased her migraine frequency.  She would like to return to Adderall.  Her Epworth score was 16 out of 24 points and I think it is justified to give her an extended release form of Adderall as she has tried extended and immediate release forms in the past.  Plan:   Adderall 30 mg XR.   DIAGNOSTIC DATA (LABS, IMAGING, TESTING) - I reviewed patient records, labs, notes, testing and imaging myself where available.  Lab Results  Component Value Date   WBC 6.1 08/08/2017   HGB 13.1 08/08/2017     HCT 39.5 08/08/2017   MCV 87.0 08/08/2017   PLT 343 08/08/2017      Component Value Date/Time   NA 139 08/08/2017 0839   NA 141 10/30/2013 1615   K 3.9 08/08/2017 0839   CL 107 08/08/2017 0839   CO2 23 08/08/2017 0839   GLUCOSE 101 (H) 08/08/2017 0839   BUN 9 08/08/2017 0839   BUN 10 10/30/2013 1615   CREATININE 0.85 08/08/2017 0839   CREATININE 0.77 12/23/2013 0837   CALCIUM 9.3 08/08/2017 0839   PROT 7.9 08/08/2017 0839   PROT 7.2 10/30/2013 1615   ALBUMIN 3.7 08/08/2017 0839   ALBUMIN 4.3 10/30/2013 1615   AST 21 08/08/2017 0839   ALT 23 08/08/2017 0839   ALKPHOS 74 08/08/2017 0839   BILITOT 0.2 (L) 08/08/2017 0839   GFRNONAA >60 08/08/2017 0839   GFRAA >60 08/08/2017 0839    Lab Results  Component Value Date   VITAMINB12 275 10/30/2013       ASSESSMENT AND PLAN 31 y.o. year old female  has a past medical history of Anemia, Anxiety, ASTHMA NOS W/ACUTE EXACERBATION (01/05/2010), Back pain, Coarse tremors, Degenerative disc disease, Gallstones, GERD (gastroesophageal reflux disease), History of kidney stones, History of migraine, History of UTI, IBS (irritable bowel syndrome), Medullary sponge kidney (2007), MIGRAINES, HX OF (06/24/2007), Narcolepsy, Narcolepsy (05/18/2014), OCD (obsessive compulsive disorder), PYELONEPHRITIS (06/24/2007), Seizures ([place]), and Urinary frequency. here with:  1. Narcolepsy with cataplectic attacks. She needs adderall refilled. Adderall 30 mg XR 2. Insomnia improved on Trazodone - can continue  3. Depression improved , see below -   For now the patient will again be taking Adderall XR . The patient has had significant weight gain since starting Remeron. This medication will be discontinued. She will try taking trazodone 50 mg at bedtime for insomnia  and this potentially could help with depression. Also advised that Adderall can enhance the effect of trazodone potentially causing serotonin syndrome.  The patient d/c adderall for the last 6  month. She felt it had less and less effect but now feels she needs something to function in her new job at [employer].  I have reviewed the side effects of serotonin syndrome with the patient.  She voiced understanding. Also advised that she should see her psychiatrist to discuss depression- her depression feels lifted once she had improved  financial security, less stress. Sleeps better on trazodone. She will follow-up in 6- 12 months with NP.    Larey Seat, MD  08/13/2017, 1:45 PM [facility] [address], [place], [place] [address] [phone]

## 2017-08-13 NOTE — Patient Instructions (Signed)
You have a condition called narcolepsy: This means, that you have a sleep disorder that manifests with at times severe excessive sleepiness during the day and often with problems with sleep at night. We may have to try different medications that may help you stay awake during the day. Not everything works with everybody the same way. Wake promoting agents include stimulants and non-stimulant type medications. The most common side effects with stimulants are weight loss, insomnia, nervousness, headaches, palpitations, rise in blood pressure, anxiety. Stimulants can be addictive and subject to abuse. Non-stimulant type wake promoting medications include Provigil and Nuvigil, most common side effects include headaches, nervousness, insomnia, hypertension. In addition there is a medication called Xyrem which has been proven to be very effective in patients with narcolepsy with or without cataplexy. Some patients with narcolepsy report episodes of weakness, such as jaw or facial weakness, legs giving out, feeling wobbly or like "Jell-o", etc. in situations of anxiety, stress, laughter, sudden sadness, surprise, etc., which is called cataplexy. You can also experience episodes of sleep paralysis during which you may feel unable to move upon awakening. Some people experience dreamlike sequences upon awakening or upon drifting off to sleep, called hypnopompic or hypnagogic hallucinations.

## 2017-08-15 ENCOUNTER — Other Ambulatory Visit: Payer: Self-pay | Admitting: Adult Health

## 2017-08-22 ENCOUNTER — Encounter: Payer: Self-pay | Admitting: Gastroenterology

## 2017-08-22 ENCOUNTER — Other Ambulatory Visit: Payer: Managed Care, Other (non HMO)

## 2017-08-22 ENCOUNTER — Ambulatory Visit (INDEPENDENT_AMBULATORY_CARE_PROVIDER_SITE_OTHER): Payer: Managed Care, Other (non HMO) | Admitting: Gastroenterology

## 2017-08-22 VITALS — BP 120/78 | HR 100 | Ht 60.0 in | Wt 212.0 lb

## 2017-08-22 DIAGNOSIS — K219 Gastro-esophageal reflux disease without esophagitis: Secondary | ICD-10-CM | POA: Diagnosis not present

## 2017-08-22 DIAGNOSIS — R197 Diarrhea, unspecified: Secondary | ICD-10-CM | POA: Diagnosis not present

## 2017-08-22 MED ORDER — CHOLESTYRAMINE 4 G PO PACK: 4.0000 g | PACK | Freq: Every day | ORAL | 5 refills | Status: DC

## 2017-08-22 NOTE — Progress Notes (Signed)
HPI: This is a very pleasant 31 year old woman who was referred to me by Dolan Amen, FNP  to evaluate GERD, loose stools.    Chief complaint is GERD, loose stools  She had her gallbladder removed for gallstone disease 4 years ago and after that she had a lot of loose stools for about a year.  Those changes improved.  For the past year or 2 she has had a lot of pyrosis and acid regurg.  She saw a black stool on one occasion.  Eating tends to cause fairly immediate loose stools.  She cut gluten out of her diet and the loose stools improved but not completely.  She is really not strict with her gluten avoidance.  She gained 60 pounds in the past year or so.  But she says this is due to some type of medicine that she was started on.  She rarely drinks caffeine and alcohol.  She is not a big chocolate or peppermint eater.  She tried Prilosec prescription strength for her pyrosis and heartburn.  This helped somewhat but not completely.  She stopped after a month or so.   Review of systems: Pertinent positive and negative review of systems were noted in the above HPI section. All other review negative.   Past Medical History:  Diagnosis Date  . Anemia    takes Ferrous Sulfate daily  . Anxiety    takes Klonopin nightly  . ASTHMA NOS W/ACUTE EXACERBATION 01/05/2010   exercise induced  . Back pain   . Coarse tremors   . Degenerative disc disease    back  . Gallstones   . GERD (gastroesophageal reflux disease)    but doesn't take anything for it  . History of kidney stones   . History of migraine    last one a couple of days ago  . History of UTI    frequent  . IBS (irritable bowel syndrome)    doesn't take any meds  . Medullary sponge kidney 2007   kidneys can be sick but she isnt sick  . MIGRAINES, HX OF 06/24/2007  . Narcolepsy   . 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 OCD (obsessive compulsive disorder)    takes Prozac daily  . PYELONEPHRITIS 06/24/2007  . Seizures ([facility])    takes  Lamictal daily;last seizure was in 11/14/10  . Urinary frequency     Past Surgical History:  Procedure Laterality Date  . BACK SURGERY    . CHOLECYSTECTOMY N/A 12/02/2013   Procedure: LAPAROSCOPIC CHOLECYSTECTOMY WITH ATTEMPTED INTRAOPERATIVE CHOLANGIOGRAM;  Surgeon: Atilano Ina, MD;  [facility];  Service: General;  Laterality: N/A;  . LUMBAR LAMINECTOMY/DECOMPRESSION MICRODISCECTOMY  07/24/2012   Procedure: LUMBAR LAMINECTOMY/DECOMPRESSION MICRODISCECTOMY;  Surgeon: Jacki Cones, MD;  Location: [facility] ORS;  Service: Orthopedics;  Laterality: Right;  L5-S1  . WISDOM TOOTH EXTRACTION      Current Outpatient Medications  Medication Sig Dispense Refill  . albuterol (PROVENTIL HFA;VENTOLIN HFA) 108 (90 Base) MCG/ACT inhaler Inhale 1-2 puffs into the lungs every 6 (six) hours as needed for wheezing or shortness of breath.    . amphetamine-dextroamphetamine (ADDERALL XR) 30 MG 24 hr capsule Take 1 capsule (30 mg total) daily by mouth. 90 capsule 0  . EPINEPHrine (EPIPEN 2-PAK) 0.3 mg/0.3 mL IJ SOAJ injection Inject 0.3 mg into the muscle once as needed (for severe allergic reaction).    . mometasone-formoterol (DULERA) 200-5 MCG/ACT AERO Inhale 1 puff 2 (two) times daily into the lungs.     Marland Kitchen  norgestrel-ethinyl estradiol (LO/OVRAL,CRYSELLE) 0.3-30 MG-MCG tablet Take 1 tablet by mouth at bedtime.     . traZODone (DESYREL) 50 MG tablet TAKE 1 TABLET BY MOUTH EVERY DAY AT BEDTIME 30 tablet 5   No current facility-administered medications for this visit.     Allergies as of 08/22/2017 - Review Complete 08/22/2017  Allergen Reaction Noted  . Other Anaphylaxis and Other (See Comments) 09/01/2011  . Soy allergy Anaphylaxis 08/04/2014  . Sumatriptan Rash 06/05/2015  . Zolmitriptan Rash   . Ciprofloxacin Other (See Comments)   . Nsaids Other (See Comments) 11/14/2010  . Topiramate Other (See Comments)   . Adhesive [tape] Itching and Rash 11/19/2013  . Triptans Rash 07/23/2012    Family  History  Problem Relation Age of Onset  . Heart disease Mother   . ADD / ADHD Sister   . Anesthesia problems Neg Hx     Social History   Socioeconomic History  . Marital status: Married    Spouse name: Not on file  . Number of children: 2  . Years of education: 3514  . Highest education level: Not on file  Social Needs  . Financial resource strain: Not on file  . Food insecurity - worry: Not on file  . Food insecurity - inability: Not on file  . Transportation needs - medical: Not on file  . Transportation needs - non-medical: Not on file  Occupational History  . 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 Smoking status: Never Smoker  . Smokeless tobacco: Never Used  Substance and Sexual Activity  . Alcohol use: No    Alcohol/week: 0.0 oz    Comment: occas. when not pregnant  . Drug use: No  . Sexual activity: Yes    Partners: Male    Comment: last sex  Aug 22 2014  Other Topics Concern  . Not on file  Social History Narrative   HSG, finished 2 years of college. single mom with 2 sons blake - Aug '09, Elijah Dec '12. 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 Still lives with father of her sons with wedding plans on hold ( Jan '13)     Physical Exam: BP 120/78   Pulse 100   Ht 5' (1.524 m)   Wt 212 lb (96.2 kg)   LMP 08/22/2017 (Exact Date)   BMI 41.40 kg/m  Constitutional: Obese otherwise well-appearing Psychiatric: alert and oriented x3 Eyes: extraocular movements intact Mouth: oral pharynx moist, no lesions Neck: supple no lymphadenopathy Cardiovascular: heart regular rate and rhythm Lungs: clear to auscultation bilaterally Abdomen: soft, nontender, nondistended, no obvious ascites, no peritoneal signs, normal bowel sounds Extremities: no lower extremity edema bilaterally Skin: no lesions on visible extremities   Assessment and plan: 31 y.o. female with  loose stools, chronic GERD  I think some of her bowel issues and loose stools may be related to her remote gallbladder removal.  She definitely had acute symptoms for about a year.  I have her try cholestyramine powder once daily to see if that helps get her bowels a bit back more than normal.  She will also have stool testing today for chronic infection.  Thinks cutting back on gluten has helped a bit with her stools.  I would like to see if she truly has celiac sprue and she will resume gluten in her diet for the next couple weeks  and then will have blood test checked for celiac sprue as well as a CBC and complete metabolic profile.  For her GERD symptoms I am recommending a trial of twice daily H2 blocker and upper endoscopy since she saw black stools.    Please see the "Patient Instructions" section for addition details about the plan.   Rob Buntinganiel Daniyal Tabor, MD [facility] 08/22/2017, 11:19 AM  Cc: Dolan AmenBailey, Sarah M, FNP

## 2017-08-22 NOTE — Patient Instructions (Addendum)
You will have labs checked today in the basement lab.  Please head down after you check out with the front desk  (tTG, total IgA level, cbc, cmet in 2 weeks after you resume gluten completely;  stool for ova and parasites, routine stool culture now.  Please start ranitidine 150mg  pill, one pill in AM and one at bedtime nightly.  You will be set up for an upper endoscopy for GERD (in 2-3 weeks). Cholestyramine 4gm powder, one dose every AM with food, disp 1 month, 5 refills.  Normal BMI (Body Mass Index- based on height and weight) is between 19 and 25. Your BMI today is Body mass index is 41.4 kg/m. Marland Kitchen. Please consider follow up  regarding your BMI with your Primary Care Provider.

## 2017-09-03 ENCOUNTER — Encounter: Payer: Self-pay | Admitting: Gastroenterology

## 2017-09-17 ENCOUNTER — Telehealth: Payer: Self-pay | Admitting: Gastroenterology

## 2017-09-17 ENCOUNTER — Encounter: Payer: Managed Care, Other (non HMO) | Admitting: Gastroenterology

## 2017-09-18 NOTE — Telephone Encounter (Signed)
ok 

## 2017-10-06 ENCOUNTER — Other Ambulatory Visit: Payer: Self-pay | Admitting: Orthopedic Surgery

## 2017-10-06 DIAGNOSIS — M5410 Radiculopathy, site unspecified: Secondary | ICD-10-CM

## 2017-10-06 DIAGNOSIS — M5412 Radiculopathy, cervical region: Secondary | ICD-10-CM

## 2017-10-31 ENCOUNTER — Other Ambulatory Visit: Payer: Self-pay | Admitting: Neurology

## 2017-10-31 DIAGNOSIS — Z6841 Body Mass Index (BMI) 40.0 and over, adult: Secondary | ICD-10-CM

## 2017-10-31 DIAGNOSIS — G4710 Hypersomnia, unspecified: Secondary | ICD-10-CM

## 2017-10-31 DIAGNOSIS — F988 Other specified behavioral and emotional disorders with onset usually occurring in childhood and adolescence: Secondary | ICD-10-CM

## 2017-10-31 DIAGNOSIS — G47411 Narcolepsy with cataplexy: Secondary | ICD-10-CM

## 2017-10-31 MED ORDER — AMPHETAMINE-DEXTROAMPHET ER 30 MG PO CP24: 30.0000 mg | ORAL_CAPSULE | Freq: Every day | ORAL | 0 refills | Status: DC

## 2017-11-12 ENCOUNTER — Telehealth: Payer: Self-pay | Admitting: *Deleted

## 2017-11-12 DIAGNOSIS — Z0289 Encounter for other administrative examinations: Secondary | ICD-10-CM

## 2017-11-12 NOTE — Telephone Encounter (Signed)
Pt Charter form on [employer] desk.

## 2017-11-13 NOTE — Telephone Encounter (Signed)
Pt returned Rn's call. She is currently working 12:15pm - 9:15 pm Tuesday - Saturday. The call was transferred to RN for more clarification on the 2nd question.

## 2017-11-13 NOTE — Telephone Encounter (Signed)
Will discuss with patient and RN

## 2017-11-13 NOTE — Telephone Encounter (Signed)
Pt called back and her normal shift is 1215-2115.  She had 2 days of training in which she was scheduled 1600-0030.   First day of training she left at 0030 and noted hard time keeping awake when driving home.  2nd day she was given 1/2 day off, which was good but due to the different shift this interrupted her sleep pattern and she stated she had to sleep for 2 days straight to get back on track.  Her HR dept recommended that she ask for the accomodation so as not be scheduled for training at later time then 2130 due to sleep issues.  If ok to fill out form, let me know.

## 2017-11-13 NOTE — Telephone Encounter (Signed)
LMVM for pt to return call.  (asking what shift/ hours she is working now). Second? 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

## 2017-11-14 NOTE — Telephone Encounter (Signed)
Completed, to Dr. Vickey Hugerohmeier for review, additions and then signature.

## 2017-11-15 ENCOUNTER — Telehealth: Payer: Self-pay | Admitting: Neurology

## 2017-11-15 NOTE — Telephone Encounter (Signed)
Called pt to let her know that her form is ready, she stated she will pick it up at check in.

## 2017-12-17 ENCOUNTER — Telehealth: Payer: Self-pay | Admitting: Neurology

## 2017-12-17 ENCOUNTER — Other Ambulatory Visit: Payer: Self-pay | Admitting: Neurology

## 2017-12-17 DIAGNOSIS — Z6841 Body Mass Index (BMI) 40.0 and over, adult: Secondary | ICD-10-CM

## 2017-12-17 DIAGNOSIS — F988 Other specified behavioral and emotional disorders with onset usually occurring in childhood and adolescence: Secondary | ICD-10-CM

## 2017-12-17 DIAGNOSIS — G47411 Narcolepsy with cataplexy: Secondary | ICD-10-CM

## 2017-12-17 DIAGNOSIS — G4710 Hypersomnia, unspecified: Secondary | ICD-10-CM

## 2017-12-17 MED ORDER — AMPHETAMINE-DEXTROAMPHET ER 30 MG PO CP24: 30.0000 mg | ORAL_CAPSULE | Freq: Every day | ORAL | 0 refills | Status: DC

## 2017-12-17 NOTE — Telephone Encounter (Signed)
Called to let her know that the script is ready and will be at the front for pick up

## 2017-12-17 NOTE — Telephone Encounter (Signed)
Pt requesting a refill for amphetamine-dextroamphetamine (ADDERALL XR) 30 MG 24 hr capsule stating her insurance will only approve 30 day supplies unless written in 3 different prescriptions sent to [facility]

## 2017-12-19 ENCOUNTER — Telehealth: Payer: Self-pay | Admitting: Neurology

## 2017-12-19 DIAGNOSIS — F988 Other specified behavioral and emotional disorders with onset usually occurring in childhood and adolescence: Secondary | ICD-10-CM

## 2017-12-19 DIAGNOSIS — G47411 Narcolepsy with cataplexy: Secondary | ICD-10-CM

## 2017-12-19 DIAGNOSIS — Z6841 Body Mass Index (BMI) 40.0 and over, adult: Secondary | ICD-10-CM

## 2017-12-19 DIAGNOSIS — G4710 Hypersomnia, unspecified: Secondary | ICD-10-CM

## 2017-12-20 ENCOUNTER — Other Ambulatory Visit: Payer: Self-pay | Admitting: Neurology

## 2017-12-20 DIAGNOSIS — Z6841 Body Mass Index (BMI) 40.0 and over, adult: Secondary | ICD-10-CM

## 2017-12-20 DIAGNOSIS — G47411 Narcolepsy with cataplexy: Secondary | ICD-10-CM

## 2017-12-20 DIAGNOSIS — G4710 Hypersomnia, unspecified: Secondary | ICD-10-CM

## 2017-12-20 DIAGNOSIS — F988 Other specified behavioral and emotional disorders with onset usually occurring in childhood and adolescence: Secondary | ICD-10-CM

## 2017-12-20 MED ORDER — AMPHETAMINE-DEXTROAMPHET ER 30 MG PO CP24: 30.0000 mg | ORAL_CAPSULE | Freq: Every day | ORAL | 0 refills | Status: DC

## 2017-12-20 NOTE — Telephone Encounter (Signed)
error 

## 2018-01-15 ENCOUNTER — Other Ambulatory Visit: Payer: Self-pay | Admitting: Neurology

## 2018-01-15 DIAGNOSIS — G4710 Hypersomnia, unspecified: Secondary | ICD-10-CM

## 2018-01-15 DIAGNOSIS — Z6841 Body Mass Index (BMI) 40.0 and over, adult: Secondary | ICD-10-CM

## 2018-01-15 DIAGNOSIS — G47411 Narcolepsy with cataplexy: Secondary | ICD-10-CM

## 2018-01-15 DIAGNOSIS — F988 Other specified behavioral and emotional disorders with onset usually occurring in childhood and adolescence: Secondary | ICD-10-CM

## 2018-01-16 MED ORDER — AMPHETAMINE-DEXTROAMPHET ER 30 MG PO CP24: 30.0000 mg | ORAL_CAPSULE | Freq: Every day | ORAL | 0 refills | Status: DC

## 2018-01-16 NOTE — Telephone Encounter (Signed)
[place] narcotic registry checked.  She is not being prescribed stimulants from other physicians.

## 2018-01-26 ENCOUNTER — Emergency Department (HOSPITAL_COMMUNITY): Payer: Managed Care, Other (non HMO)

## 2018-01-26 ENCOUNTER — Emergency Department (HOSPITAL_COMMUNITY)
Admission: EM | Admit: 2018-01-26 | Discharge: 2018-01-26 | Disposition: A | Discharge status: Home / Self Care | Payer: Managed Care, Other (non HMO) | Attending: Emergency Medicine | Admitting: Emergency Medicine

## 2018-01-26 ENCOUNTER — Other Ambulatory Visit: Payer: Self-pay

## 2018-01-26 ENCOUNTER — Encounter (HOSPITAL_COMMUNITY): Payer: Self-pay

## 2018-01-26 DIAGNOSIS — N3090 Cystitis, unspecified without hematuria: Secondary | ICD-10-CM | POA: Diagnosis not present

## 2018-01-26 DIAGNOSIS — R1013 Epigastric pain: Secondary | ICD-10-CM | POA: Diagnosis present

## 2018-01-26 DIAGNOSIS — M25511 Pain in right shoulder: Secondary | ICD-10-CM | POA: Insufficient documentation

## 2018-01-26 DIAGNOSIS — J45909 Unspecified asthma, uncomplicated: Secondary | ICD-10-CM | POA: Diagnosis not present

## 2018-01-26 DIAGNOSIS — M542 Cervicalgia: Secondary | ICD-10-CM | POA: Diagnosis not present

## 2018-01-26 DIAGNOSIS — Z79899 Other long term (current) drug therapy: Secondary | ICD-10-CM | POA: Diagnosis not present

## 2018-01-26 DIAGNOSIS — R109 Unspecified abdominal pain: Secondary | ICD-10-CM

## 2018-01-26 LAB — CBC
HCT: 39.9 % (ref 36.0–46.0)
Hemoglobin: 13.1 g/dL (ref 12.0–15.0)
MCH: 28.6 pg (ref 26.0–34.0)
MCHC: 32.8 g/dL (ref 30.0–36.0)
MCV: 87.1 fL (ref 78.0–100.0)
Platelets: 304 10*3/uL (ref 150–400)
RBC: 4.58 MIL/uL (ref 3.87–5.11)
RDW: 13 % (ref 11.5–15.5)
WBC: 5.9 10*3/uL (ref 4.0–10.5)

## 2018-01-26 LAB — COMPREHENSIVE METABOLIC PANEL
ALK PHOS: 74 U/L (ref 38–126)
ALT: 56 U/L — AB (ref 14–54)
AST: 34 U/L (ref 15–41)
Albumin: 3.7 g/dL (ref 3.5–5.0)
Anion gap: 9 (ref 5–15)
BILIRUBIN TOTAL: 0.4 mg/dL (ref 0.3–1.2)
BUN: 5 mg/dL — AB (ref 6–20)
CO2: 22 mmol/L (ref 22–32)
CREATININE: 0.87 mg/dL (ref 0.44–1.00)
Calcium: 9.4 mg/dL (ref 8.9–10.3)
Chloride: 104 mmol/L (ref 101–111)
GFR calc Af Amer: >60 mL/min (ref >60)
Glucose, Bld: 105 mg/dL — ABNORMAL HIGH (ref 65–99)
Potassium: 4.1 mmol/L (ref 3.5–5.1)
Sodium: 135 mmol/L (ref 135–145)
TOTAL PROTEIN: 7.3 g/dL (ref 6.5–8.1)

## 2018-01-26 LAB — URINALYSIS, ROUTINE W REFLEX MICROSCOPIC
BILIRUBIN URINE: NEGATIVE
GLUCOSE, UA: NEGATIVE mg/dL
HGB URINE DIPSTICK: NEGATIVE
Ketones, ur: 5 mg/dL — AB
Nitrite: NEGATIVE
PROTEIN: NEGATIVE mg/dL
Specific Gravity, Urine: 1.026 (ref 1.005–1.030)
pH: 6 (ref 5.0–8.0)

## 2018-01-26 LAB — LIPASE, BLOOD: Lipase: 24 U/L (ref 11–51)

## 2018-01-26 LAB — I-STAT BETA HCG BLOOD, ED (MC, WL, AP ONLY)

## 2018-01-26 MED ORDER — METHOCARBAMOL 500 MG PO TABS: 500.0000 mg | ORAL_TABLET | Freq: Three times a day (TID) | ORAL | 0 refills | Status: DC | PRN

## 2018-01-26 MED ORDER — ACETAMINOPHEN 500 MG PO TABS
1000.0000 mg | ORAL_TABLET | Freq: Once | ORAL | Status: AC
  Administered 2018-01-26: 1000 mg via ORAL
  Filled 2018-01-26: qty 2

## 2018-01-26 MED ORDER — CEPHALEXIN 500 MG PO CAPS: 500.0000 mg | ORAL_CAPSULE | Freq: Three times a day (TID) | ORAL | 0 refills | Status: DC

## 2018-01-26 MED ORDER — ONDANSETRON 4 MG PO TBDP
4.0000 mg | ORAL_TABLET | Freq: Once | ORAL | Status: AC
  Administered 2018-01-26: 4 mg via ORAL
  Filled 2018-01-26: qty 1

## 2018-01-26 NOTE — ED Provider Notes (Signed)
[facility] EMERGENCY DEPARTMENT Provider Note   CSN: [id number] Arrival date & time: 01/26/18  1124     History   Chief Complaint Chief Complaint  Patient presents with  . Abdominal Cramping    HPI [name] is a 32 y.o. female with a hx of pyelonephritis, GERD, nephrolithiasis, IBS, seizures, asthma, anxiety, and cholecystectomy who presents to the ED with complaint of abdominal pain that started last night. Patient states that last evening she transitioned from sitting to standing and got a tight type pain in her epigastric region which radiated to the RUQ/R flank, states she felt like she was having spasms in that area. States it has been constant. Worse if she laid on the R side or with twisting/turning motions. Was able to go to sleep last night after taking a hydrocodone she had leftover. She states she woke up this AM and pain had persisted. She also had pain in the R side of her neck radiating to the RUE. Describes this pain as a burning/tingling sensation. This pain is worse with RUE movement. She has had persistence in her abdominal discomfort. Rates her overall discomfort a 6/10 in severity. Has had some mild associated nausea without vomiting. Denies fever, vomiting, diarrhea, constipation, blood in stool, dysuria, vaginal bleeding, or vaginal discharge. LMP 01/04/18- no concern for pregnancy/std.  HPI  Past Medical History:  Diagnosis Date  . Anemia    takes Ferrous Sulfate daily  . Anxiety    takes Klonopin nightly  . ASTHMA NOS W/ACUTE EXACERBATION 01/05/2010   exercise induced  . Back pain   . Coarse tremors   . Degenerative disc disease    back  . Gallstones   . GERD (gastroesophageal reflux disease)    but doesn't take anything for it  . History of kidney stones   . History of migraine    last one a couple of days ago  . History of UTI    frequent  . IBS (irritable bowel syndrome)    doesn't take any meds  . Medullary sponge kidney 2007     kidneys can be sick but she isnt sick  . MIGRAINES, HX OF 06/24/2007  . Narcolepsy   . 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 OCD (obsessive compulsive disorder)    takes Prozac daily  . PYELONEPHRITIS 06/24/2007  . Seizures (HCC)    takes Lamictal daily;last seizure was in 11/14/10  . Urinary frequency     Patient Active Problem List   Diagnosis Date Noted  . Class 3 severe obesity with body mass index (BMI) of 40.0 to 44.9 in adult (HCC) 08/13/2017  . 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 Hypersomnia, persistent 08/13/2017  . 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 S/P laparoscopic cholecystectomy 12/02/2013  . 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  . H/O abnormal cervical Papanicolaou smear 09/29/2013  . OCD (obsessive compulsive disorder)   . Convulsions/seizures (HCC) 04/21/2013  . Anxiety 04/21/2013  . 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 Chronic cough 10/17/2011  . 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 ASTHMA NOS W/ACUTE EXACERBATION 01/05/2010  . ABSENCE OF MENSTRUATION 10/27/2009  . MIGRAINES, HX OF 06/24/2007    Past Surgical History:  Procedure Laterality Date  . BACK SURGERY    . CHOLECYSTECTOMY N/A 12/02/2013   Procedure: LAPAROSCOPIC CHOLECYSTECTOMY WITH ATTEMPTED INTRAOPERATIVE CHOLANGIOGRAM;  Surgeon: Atilano Ina, MD;  Location: [facility] OR;  Service: General;  Laterality: N/A;  . LUMBAR LAMINECTOMY/DECOMPRESSION MICRODISCECTOMY  07/24/2012  Procedure: LUMBAR LAMINECTOMY/DECOMPRESSION MICRODISCECTOMY;  Surgeon: Jacki Cones, MD;  Location: [facility] ORS;  Service: Orthopedics;  Laterality: Right;  L5-S1  . WISDOM TOOTH EXTRACTION       OB History    Gravida  3   Para  2   Term  2   Preterm  0   AB  0   Living  2     SAB  0   TAB  0   Ectopic  0   Multiple  0   Live Births  2            Home Medications    Prior to Admission medications   Medication Sig Start Date End Date Taking?  Authorizing Provider  albuterol (PROVENTIL HFA;VENTOLIN HFA) 108 (90 Base) MCG/ACT inhaler Inhale 1-2 puffs into the lungs every 6 (six) hours as needed for wheezing or shortness of breath.    [provider]  amphetamine-dextroamphetamine (ADDERALL XR) 30 MG 24 hr capsule Take 1 capsule (30 mg total) by mouth daily. 01/16/18   Sater, Pearletha Furl, MD  cholestyramine Lanetta Inch) 4 g packet Take 1 packet (4 g total) by mouth daily. 08/22/17 09/21/17  Rachael Fee, MD  EPINEPHrine (EPIPEN 2-PAK) 0.3 mg/0.3 mL IJ SOAJ injection Inject 0.3 mg into the muscle once as needed (for severe allergic reaction).    [provider]  mometasone-formoterol (DULERA) 200-5 MCG/ACT AERO Inhale 1 puff 2 (two) times daily into the lungs.     [provider]  norgestrel-ethinyl estradiol (LO/OVRAL,CRYSELLE) 0.3-30 MG-MCG tablet Take 1 tablet by mouth at bedtime.     [provider]  traZODone (DESYREL) 50 MG tablet TAKE 1 TABLET BY MOUTH EVERY DAY AT BEDTIME 08/15/17   Butch Penny, NP    Family History Family History  Problem Relation Age of Onset  . Heart disease Mother   . ADD / ADHD Sister   . Anesthesia problems Neg Hx     Social History Social History   Tobacco Use  . Smoking status: Never Smoker  . Smokeless tobacco: Never Used  Substance Use Topics  . Alcohol use: No    Alcohol/week: 0.0 oz    Comment: occas. when not pregnant  . Drug use: No     Allergies   Other; Soy allergy; Sumatriptan; Zolmitriptan; Ciprofloxacin; Nsaids; Topiramate; Adhesive [tape]; and Triptans   Review of Systems Review of Systems  Constitutional: Negative for chills and fever.  Respiratory: Positive for shortness of breath (chronic unchanged related to "spots in her lungs").   Cardiovascular: Negative for palpitations and leg swelling.  Gastrointestinal: Positive for abdominal pain and nausea. Negative for blood in stool, constipation, diarrhea and vomiting.  Genitourinary:  Negative for dysuria, hematuria, vaginal bleeding and vaginal discharge.  Musculoskeletal: Positive for arthralgias (R shoulder) and myalgias (RUE).  Neurological: Negative for weakness, numbness and headaches.    Physical Exam Updated Vital Signs BP (!) 149/95 (BP Location: Right Arm)   Pulse 97   Temp 99.3 F (37.4 C)   Resp 14   Ht 5' (1.524 m)   Wt 86.2 kg (190 lb)   SpO2 99%   BMI 37.11 kg/m   Physical Exam  Constitutional: She appears well-developed and well-nourished. No distress.  HENT:  Head: Normocephalic and atraumatic.  Eyes: Conjunctivae are normal. Right eye exhibits no discharge. Left eye exhibits no discharge.  Neck: Normal range of motion. Neck supple. Muscular tenderness (Right sided, especially right trapezius.) present. No spinous process tenderness present.  Cardiovascular:  Normal rate and regular rhythm.  No murmur heard. 2+ symmetric radial pulses.  Pulmonary/Chest: Breath sounds normal. No respiratory distress. She has no wheezes. She has no rales.  Abdominal: Soft. She exhibits no distension and no mass. There is tenderness (epigastric and RUQ). There is no rebound and no guarding.  Musculoskeletal:  No obvious deformities, appreciable swelling, erythema, ecchymosis, or open wounds. Back: Patient has right-sided thoracic paraspinal muscle tenderness palpation which extends to the right CVA region-more prominent paraspinal muscle regions.  She additionally has right trapezius tenderness to palpation with palpable muscle spasm.  She has no midline tenderness to palpation. Upper extremities: Patient has normal range of motion to all joints of the upper extremities, with the exception of limited right shoulder flexion and abduction secondary to discomfort.  Upper extremities are nontender.   Neurological: She is alert.  Clear speech.  Sensation grossly intact bilateral upper extremities.  5 out of 5 grip strength.  She is able to perform okay sign, thumbs up,  cross second and third digits with bilateral upper extremities.  Skin: Skin is warm and dry. No rash noted.  Psychiatric: She has a normal mood and affect. Her behavior is normal.  Nursing note and vitals reviewed.    ED Treatments / Results  Labs Results for orders placed or performed during the hospital encounter of 01/26/18  Lipase, blood  Result Value Ref Range   Lipase 24 11 - 51 U/L  Comprehensive metabolic panel  Result Value Ref Range   Sodium 135 135 - 145 mmol/L   Potassium 4.1 3.5 - 5.1 mmol/L   Chloride 104 101 - 111 mmol/L   CO2 22 22 - 32 mmol/L   Glucose, Bld 105 (H) 65 - 99 mg/dL   BUN 5 (L) 6 - 20 mg/dL   Creatinine, Ser 4.54 0.44 - 1.00 mg/dL   Calcium 9.4 8.9 - 09.8 mg/dL   Total Protein 7.3 6.5 - 8.1 g/dL   Albumin 3.7 3.5 - 5.0 g/dL   AST 34 15 - 41 U/L   ALT 56 (H) 14 - 54 U/L   Alkaline Phosphatase 74 38 - 126 U/L   Total Bilirubin 0.4 0.3 - 1.2 mg/dL   GFR calc non Af Amer >60 >60 mL/min   GFR calc Af Amer >60 >60 mL/min   Anion gap 9 5 - 15  CBC  Result Value Ref Range   WBC 5.9 4.0 - 10.5 K/uL   RBC 4.58 3.87 - 5.11 MIL/uL   Hemoglobin 13.1 12.0 - 15.0 g/dL   HCT 11.9 14.7 - 82.9 %   MCV 87.1 78.0 - 100.0 fL   MCH 28.6 26.0 - 34.0 pg   MCHC 32.8 30.0 - 36.0 g/dL   RDW 56.2 13.0 - 86.5 %   Platelets 304 150 - 400 K/uL  Urinalysis, Routine w reflex microscopic  Result Value Ref Range   Color, Urine YELLOW YELLOW   APPearance HAZY (A) CLEAR   Specific Gravity, Urine 1.026 1.005 - 1.030   pH 6.0 5.0 - 8.0   Glucose, UA NEGATIVE NEGATIVE mg/dL   Hgb urine dipstick NEGATIVE NEGATIVE   Bilirubin Urine NEGATIVE NEGATIVE   Ketones, ur 5 (A) NEGATIVE mg/dL   Protein, ur NEGATIVE NEGATIVE mg/dL   Nitrite NEGATIVE NEGATIVE   Leukocytes, UA SMALL (A) NEGATIVE   RBC / HPF 0-5 0 - 5 RBC/hpf   WBC, UA 6-10 0 - 5 WBC/hpf   Bacteria, UA RARE (A) NONE SEEN   Squamous Epithelial /  LPF 6-10 0 - 5   Mucus PRESENT   I-Stat beta hCG blood, ED  Result  Value Ref Range   I-stat hCG, quantitative <5.0 <5 mIU/mL   Comment 3           EKG None  Radiology Dg Chest 2 View  Result Date: 01/26/2018 CLINICAL DATA:  Generalized abdominal cramping since last night. EXAM: CHEST - 2 VIEW COMPARISON:  None. FINDINGS: The heart size and mediastinal contours are within normal limits. Both lungs are clear. The visualized skeletal structures are unremarkable. IMPRESSION: Negative two view chest x-ray Electronically Signed   By: Marin Roberts M.D.   On: 01/26/2018 13:45   Ct Renal Stone Study  Result Date: 01/26/2018 CLINICAL DATA:  Generalized abdominal cramping since last night. Pain radiates to the right shoulder. History medullary sponge kidney disease and previous cholecystectomy. EXAM: CT ABDOMEN AND PELVIS WITHOUT CONTRAST TECHNIQUE: Multidetector CT imaging of the abdomen and pelvis was performed following the standard protocol without IV contrast. COMPARISON:  CT 04/01/2017. FINDINGS: Lower chest: Stable chronic peribronchovascular nodularity in the right lower lobe, attributed to remote infection. The lung bases are otherwise clear. There is no significant pleural or pericardial effusion. Hepatobiliary: The liver appears unremarkable as imaged in the noncontrast state. No significant biliary dilatation status post cholecystectomy. Pancreas: Unremarkable. No pancreatic ductal dilatation or surrounding inflammatory changes. Spleen: Normal in size without focal abnormality. Adrenals/Urinary Tract: Both adrenal glands appear normal. Both kidneys appear normal. No evidence of urinary tract calculus, hydronephrosis or perinephric soft tissue stranding. The bladder appears normal. Small pelvic phleboliths bilaterally. Stomach/Bowel: No evidence of bowel wall thickening, distention or surrounding inflammatory change. The appendix appears normal. Vascular/Lymphatic: There are no enlarged abdominal or pelvic lymph nodes. No significant vascular findings on  noncontrast imaging. Reproductive: The uterus and ovaries appear normal. No adnexal mass. Other: No evidence of abdominal wall mass or hernia. No ascites. Musculoskeletal: No acute or significant osseous findings. Stable endplate degenerative changes and right-sided pars defect at L5-S1. IMPRESSION: Stable examination without acute or significant findings. Electronically Signed   By: Carey Bullocks M.D.   On: 01/26/2018 14:02    Procedures Procedures (including critical care time)  Medications Ordered in ED Medications - No data to display   Initial Impression / Assessment and Plan / ED Course  I have reviewed the triage vital signs and the nursing notes.  Pertinent labs & imaging results that were available during my care of the patient were reviewed by me and considered in my medical decision making (see chart for details).   Patient presents with discomfort in multiple areas, specifically the right neck into the right upper extremity as well as the epigastric region radiating around the right side to the right back area..  She is nontoxic-appearing, in no apparent distress, initial vitals notable for hypertension, do not suspect hypertensive emergency, this normalized throughout emergency department stay.  Triage labs notable for UA which appears somewhat infected.  No leukocytosis.  No anemia.  No significant electrolyte abnormality's.  Renal function and LFTs without significant abnormalities.  Lipase WNL. Beta hCG WNL.   Discussed findings with supervising physician Dr. Effie Shy.  Recommends treatment for UTI with urine culture.  Recommends CT renal study and chest x-ray for further evaluation.  Chest x-ray negative.  CT renal study without acute or significant findings.   History and exam suspicious for separate processes causing symptoms.  Patient's right-sided neck and right upper extremity symptoms seem to be related to a cervical  radiculopathy.  She is neurovascularly intact distally.   There has been no recent trauma and imaging is not indicated at this time.  Her abdominal and back discomfort are somewhat suspicious for possible muscle spasm etiology given history provided, however no definitve etiology established.  On repeat exam patient does not have a surgical abdomen, there are no peritoneal signs, do not suspect pancreatitis, bowel obstruction/perforation, diverticulitis, PID, ectopic pregnancy, appendicitis, or cholecystitis (patient is status post cholecystectomy).  Will cover patient for UTI with Keflex, do not feel that she is having pyelonephritis, she is afebrile, no leukocytosis, she is tolerating PO, culture is pending.  Will provide prescription for Robaxin, instructed patient she is not to drive or operate heavy machinery when taking Robaxin.  Recommended Lidoderm patches for trapezius muscle. I discussed results, treatment plan, need for PCP follow-up, and return precautions with the patient. Provided opportunity for questions, patient confirmed understanding and is in agreement with plan.   Findings and plan of care discussed with supervising physician Dr. Effie Shy who is in agreement with plan.   Vitals:   01/26/18 1645 01/26/18 1723  BP:  115/80  Pulse: 72   Resp:  11  Temp:    SpO2: 100% 100%   Final Clinical Impressions(s) / ED Diagnoses   Final diagnoses:  Abdominal pain, unspecified abdominal location  Cystitis    ED Discharge Orders        Ordered    cephALEXin (KEFLEX) 500 MG capsule  3 times daily     01/26/18 1604    methocarbamol (ROBAXIN) 500 MG tablet  Every 8 hours PRN     01/26/18 1604       Jared Whorley, [place] R, PA-C 01/26/18 2334    Mancel Bale, MD 01/27/18 2152

## 2018-01-26 NOTE — ED Notes (Signed)
Pt transported back to E46 from CT on stretcher with tech, tolerated well.

## 2018-01-26 NOTE — Discharge Instructions (Signed)
You were seen in the emergency department today for abdominal pain and pain in your neck and right arm.  Your work-up here was all reassuring.  Your urine appeared somewhat infected, we are treating this with Keflex, and antibiotic. Please take all of your antibiotics until finished. You may develop abdominal discomfort or diarrhea from the antibiotic.  You may help offset this with probiotics which you can buy at the store (ask your pharmacist if unable to find) or get probiotics in the form of eating yogurt. Do not eat or take the probiotics until 2 hours after your antibiotic. If you are unable to tolerate these side effects follow-up with your primary care provider or return to the emergency department.   If you begin to experience any blistering, rashes, swelling, or difficulty breathing seek medical care for evaluation of potentially more serious side effects.   Please be aware that this medication may interact with other medications you are taking, please be sure to discuss your medication list with your pharmacist.    We are unsure of the exact cause of your discomfort.  We are suspicious that the pain in your neck and right arm is related to a muscle spasm.  It is possible that the pain in your back and abdomen is also related to muscle spasm.  We are treating this with Robaxin- Robaxin is the muscle relaxer I have prescribed, this is meant to help with muscle tightness. Be aware that this medication may make you drowsy therefore the first time you take this it should be at a time you are in an environment where you can rest. Do not drive or operate heavy machinery when taking this medication.    May take Tylenol with this medication safely per over-the-counter dosing instructions.  Also ask your pharmacist about lidocaine patches, he may get these over-the-counter, place 1 of these over the area of most significant discomfort in your right neck area.  Follow-up with your primary care provider in  the next 2 to 3 days for reevaluation of your symptoms.  Return to the ER anytime for any new or worsening symptoms including but not limited to fever, worsening discomfort, inability to keep fluids down, blood in your stool, or any other concerns that you may have.

## 2018-01-26 NOTE — ED Notes (Signed)
Patient transported to CT 

## 2018-01-26 NOTE — ED Notes (Signed)
ED Provider at bedside. 

## 2018-01-26 NOTE — ED Triage Notes (Signed)
Patient complains of generalized abdominal cramping since last night. Denies dysuria, no nausea, no vomiting. States that the pain is radiating to right shoulder

## 2018-01-27 LAB — URINE CULTURE

## 2018-02-11 ENCOUNTER — Ambulatory Visit: Payer: Managed Care, Other (non HMO) | Admitting: Adult Health

## 2018-02-18 ENCOUNTER — Other Ambulatory Visit: Payer: Self-pay | Admitting: Adult Health

## 2018-02-19 ENCOUNTER — Other Ambulatory Visit: Payer: Self-pay | Admitting: Neurology

## 2018-02-19 DIAGNOSIS — F988 Other specified behavioral and emotional disorders with onset usually occurring in childhood and adolescence: Secondary | ICD-10-CM

## 2018-02-19 DIAGNOSIS — G47411 Narcolepsy with cataplexy: Secondary | ICD-10-CM

## 2018-02-19 DIAGNOSIS — Z6841 Body Mass Index (BMI) 40.0 and over, adult: Secondary | ICD-10-CM

## 2018-02-19 DIAGNOSIS — G4710 Hypersomnia, unspecified: Secondary | ICD-10-CM

## 2018-02-19 MED ORDER — AMPHETAMINE-DEXTROAMPHET ER 30 MG PO CP24: 30.0000 mg | ORAL_CAPSULE | Freq: Every day | ORAL | 0 refills | Status: DC

## 2018-03-26 ENCOUNTER — Other Ambulatory Visit: Payer: Self-pay | Admitting: Neurology

## 2018-03-26 DIAGNOSIS — F988 Other specified behavioral and emotional disorders with onset usually occurring in childhood and adolescence: Secondary | ICD-10-CM

## 2018-03-26 DIAGNOSIS — Z6841 Body Mass Index (BMI) 40.0 and over, adult: Secondary | ICD-10-CM

## 2018-03-26 DIAGNOSIS — G4710 Hypersomnia, unspecified: Secondary | ICD-10-CM

## 2018-03-26 DIAGNOSIS — G47411 Narcolepsy with cataplexy: Secondary | ICD-10-CM

## 2018-03-26 MED ORDER — AMPHETAMINE-DEXTROAMPHET ER 30 MG PO CP24: 30.0000 mg | ORAL_CAPSULE | Freq: Every day | ORAL | 0 refills | Status: DC

## 2018-03-26 NOTE — Telephone Encounter (Signed)
Rx sent electronically.  

## 2018-03-26 NOTE — Telephone Encounter (Signed)
Rx registry checked. Last fill date is 02/19/18 #30. Last OV was 08/13/17, next OV is 04/01/18. Ok to fill in Dr. Oliva Bustardohmeier's absence?

## 2018-04-01 ENCOUNTER — Ambulatory Visit (INDEPENDENT_AMBULATORY_CARE_PROVIDER_SITE_OTHER): Payer: Managed Care, Other (non HMO) | Admitting: Adult Health

## 2018-04-01 ENCOUNTER — Encounter: Payer: Self-pay | Admitting: Adult Health

## 2018-04-01 VITALS — BP 128/78 | HR 80 | Ht 60.0 in | Wt 190.0 lb

## 2018-04-01 DIAGNOSIS — G47411 Narcolepsy with cataplexy: Secondary | ICD-10-CM | POA: Diagnosis not present

## 2018-04-01 MED ORDER — AMPHETAMINE-DEXTROAMPHETAMINE 10 MG PO TABS: ORAL_TABLET | ORAL | 0 refills | Status: DC

## 2018-04-01 NOTE — Progress Notes (Signed)
PATIENT: [name] DOB: [birth year]  REASON FOR VISIT: follow up HISTORY FROM: patient  HISTORY OF PRESENT ILLNESS: Today 04/01/18:  Ms. [name] is a 32 year old female with a history of narcolepsy.  She returns today for follow-up.  She is on Adderall.  She reports that when she initially started Adderall it was beneficial.  She states that now she is finding that she is falling asleep on her lunch break.  She works for [employer].  She reports that she has tolerated Adderall well.  Denies any heart palpitations.  Blood pressure has remained stable.  She denies depression.  Reports that she sleeps "ok" at night.  She remains on trazodone.  She returns today for evaluation.  HISTORY Interval history from 13 August 2017.  I have the pleasure of seeing [name] today, meanwhile 32 years old, right-handed Caucasian female married mother of 2.  [name] had some positive news over the last 6 months, her stress level and therefore her depression has much improved.  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  She could not tolerate armodafinil as generic or brand name Nuvigil because of frequent migraines.  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 Since she is starting a new job this month I would like for her to return to Adderall which according to her did not give her migraines.  She endorsed today a high-grade of fatigue at 57 points out of 63 possible points, and the Epworth Sleepiness Scale at 16 points out of 24 possible points.  He also reports no recent cataplectic attacks but this seems to be mainly due to medication that could control cataplexy as well as to her own tendency to restrain her emotion.   REVIEW OF SYSTEMS: Out of a complete 14 system review of symptoms, the patient complains only of the following  symptoms, and all other reviewed systems are negative.  See HPI  ALLERGIES: Allergies  Allergen Reactions  . Other Anaphylaxis and Other (See Comments)    Pt is allergic to balsamic vinaigrette.     . Soy Allergy Anaphylaxis  . Gluten Meal Diarrhea and Rash    Bloating   . Sumatriptan Rash  . Zolmitriptan Rash  . Ciprofloxacin Other (See Comments)    Reaction:  Burning of pts arm  . Nsaids Other (See Comments)    Pt states that she was told to avoid this class of medication.    . Topiramate Other (See Comments)    Reaction:  Unknown   . Adhesive [Tape] Itching and Rash  . Triptans Rash    HOME MEDICATIONS: Outpatient Medications Prior to Visit  Medication Sig Dispense Refill  . albuterol (PROVENTIL HFA;VENTOLIN HFA) 108 (90 Base) MCG/ACT inhaler Inhale 1-2 puffs into the lungs every 6 (six) hours as needed for wheezing or shortness of breath.    . amphetamine-dextroamphetamine (ADDERALL XR) 30 MG 24 hr capsule Take 1 capsule (30 mg total) by mouth daily. **Must keep pending appt. April 01, 2018 before Adderall can be refilled. 30 capsule 0  . EPINEPHrine (EPIPEN 2-PAK) 0.3 mg/0.3 mL IJ SOAJ injection Inject 0.3 mg into the muscle once as needed (for severe allergic reaction).    . mometasone-formoterol (DULERA) 200-5 MCG/ACT AERO Inhale 1 puff into the lungs daily as needed for shortness of breath.     Marland Kitchen  norgestrel-ethinyl estradiol (LO/OVRAL,CRYSELLE) 0.3-30 MG-MCG tablet Take 1 tablet by mouth at bedtime.     . traZODone (DESYREL) 50 MG tablet TAKE 1 TABLET BY MOUTH EVERY DAY AT BEDTIME 30 tablet 5  . cephALEXin (KEFLEX) 500 MG capsule Take 1 capsule (500 mg total) by mouth 3 (three) times daily. 21 capsule 0  . methocarbamol (ROBAXIN) 500 MG tablet Take 1 tablet (500 mg total) by mouth every 8 (eight) hours as needed. 30 tablet 0   No facility-administered medications prior to visit.     PAST MEDICAL HISTORY: Past Medical History:  Diagnosis Date  . Anemia    takes Ferrous  Sulfate daily  . Anxiety    takes Klonopin nightly  . ASTHMA NOS W/ACUTE EXACERBATION 01/05/2010   exercise induced  . Back pain   . Coarse tremors   . Degenerative disc disease    back  . Gallstones   . GERD (gastroesophageal reflux disease)    but doesn't take anything for it  . History of kidney stones   . History of migraine    last one a couple of days ago  . History of UTI    frequent  . IBS (irritable bowel syndrome)    doesn't take any meds  . Medullary sponge kidney 2007   kidneys can be sick but she isnt sick  . MIGRAINES, HX OF 06/24/2007  . Narcolepsy   . 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 OCD (obsessive compulsive disorder)    takes Prozac daily  . PYELONEPHRITIS 06/24/2007  . Seizures (HCC)    takes Lamictal daily;last seizure was in 11/14/10  . Urinary frequency     PAST SURGICAL HISTORY: Past Surgical History:  Procedure Laterality Date  . BACK SURGERY    . CHOLECYSTECTOMY N/A 12/02/2013   Procedure: LAPAROSCOPIC CHOLECYSTECTOMY WITH ATTEMPTED INTRAOPERATIVE CHOLANGIOGRAM;  Surgeon: Atilano Ina, MD;  Location: [facility] OR;  Service: General;  Laterality: N/A;  . LUMBAR LAMINECTOMY/DECOMPRESSION MICRODISCECTOMY  07/24/2012   Procedure: LUMBAR LAMINECTOMY/DECOMPRESSION MICRODISCECTOMY;  Surgeon: Jacki Cones, MD;  Location: [facility] ORS;  Service: Orthopedics;  Laterality: Right;  L5-S1  . WISDOM TOOTH EXTRACTION      FAMILY HISTORY: Family History  Problem Relation Age of Onset  . Heart disease Mother   . ADD / ADHD Sister   . Anesthesia problems Neg Hx     SOCIAL HISTORY: Social History   Socioeconomic History  . Marital status: Married    Spouse name: Not on file  . Number of children: 2  . Years of education: 81  . Highest education level: Not on file  Occupational History  . 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 Financial resource strain: Not on file  . Food insecurity:    Worry:  Not on file    Inability: Not on file  . Transportation needs:    Medical: Not on file    Non-medical: Not on file  Tobacco Use  . Smoking status: Never Smoker  . Smokeless tobacco: Never Used  Substance and Sexual Activity  . Alcohol use: No    Alcohol/week: 0.0 oz    Comment: occas. when not pregnant  . Drug use: No  . Sexual activity: Yes    Partners: Male    Comment: last sex  Aug 22 2014  Lifestyle  . Physical activity:    Days per week: Not on file    Minutes per session:  Not on file  . Stress: Not on file  Relationships  . Social connections:    Talks on phone: Not on file    Gets together: Not on file    Attends religious service: Not on file    Active member of club or organization: Not on file    Attends meetings of clubs or organizations: Not on file    Relationship status: Not on file  . Intimate partner violence:    Fear of current or ex partner: Not on file    Emotionally abused: Not on file    Physically abused: Not on file    Forced sexual activity: Not on file  Other Topics Concern  . Not on file  Social History Narrative   HSG, finished 2 years of college. single mom with 2 sons blake - Aug '09, Elijah Dec '12. 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 Still lives with father of her sons with wedding plans on hold ( Jan '13)      PHYSICAL EXAM  Vitals:   04/01/18 0735  BP: 128/78  Pulse: 80  Weight: 190 lb (86.2 kg)  Height: 5' (1.524 m)   Body mass index is 37.11 kg/m.  Generalized: Well developed, in no acute distress   Neurological examination  Mentation: Alert oriented to time, place, history taking. Follows all commands speech and language fluent Cranial nerve II-XII: Pupils were equal round reactive to light. Extraocular movements were full, visual field were full on confrontational test. Facial sensation and strength were normal. Uvula tongue midline. Head turning and shoulder shrug  were normal and  symmetric. Motor: The motor testing reveals 5 over 5 strength of all 4 extremities. Good symmetric motor tone is noted throughout.  Sensory: Sensory testing is intact to soft touch on all 4 extremities. No evidence of extinction is noted.  Coordination: Cerebellar testing reveals good finger-nose-finger and heel-to-shin bilaterally.  Gait and station: Gait is normal.  Reflexes: Deep tendon reflexes are symmetric and normal bilaterally.   DIAGNOSTIC DATA (LABS, IMAGING, TESTING) - I reviewed patient records, labs, notes, testing and imaging myself where available.  Lab Results  Component Value Date   WBC 5.9 01/26/2018   HGB 13.1 01/26/2018   HCT 39.9 01/26/2018   MCV 87.1 01/26/2018   PLT 304 01/26/2018      Component Value Date/Time   NA 135 01/26/2018 1136   NA 141 10/30/2013 1615   K 4.1 01/26/2018 1136   CL 104 01/26/2018 1136   CO2 22 01/26/2018 1136   GLUCOSE 105 (H) 01/26/2018 1136   BUN 5 (L) 01/26/2018 1136   BUN 10 10/30/2013 1615   CREATININE 0.87 01/26/2018 1136   CREATININE 0.77 12/23/2013 0837   CALCIUM 9.4 01/26/2018 1136   PROT 7.3 01/26/2018 1136   PROT 7.2 10/30/2013 1615   ALBUMIN 3.7 01/26/2018 1136   ALBUMIN 4.3 10/30/2013 1615   AST 34 01/26/2018 1136   ALT 56 (H) 01/26/2018 1136   ALKPHOS 74 01/26/2018 1136   BILITOT 0.4 01/26/2018 1136   GFRNONAA >60 01/26/2018 1136   GFRAA >60 01/26/2018 1136    Lab Results  Component Value Date   VITAMINB12 275 10/30/2013   Lab Results  Component Value Date   TSH 1.53 12/07/2006      ASSESSMENT AND PLAN 32 y.o. year old female  has a past medical history of Anemia, Anxiety, ASTHMA NOS W/ACUTE EXACERBATION (01/05/2010), Back pain, Coarse tremors, Degenerative disc disease,  Gallstones, GERD (gastroesophageal reflux disease), History of kidney stones, History of migraine, History of UTI, IBS (irritable bowel syndrome), Medullary sponge kidney (2007), MIGRAINES, HX OF (06/24/2007), Narcolepsy, Narcolepsy  (05/18/2014), OCD (obsessive compulsive disorder), PYELONEPHRITIS (06/24/2007), Seizures (HCC), and Urinary frequency. here with:  1.  Narcolepsy   The patient is having more sleepiness around lunchtime.  We will continue on Adderall extended release 30 mg daily.  I will add on Adderall immediate release 10 mg that can be taken at lunch if needed.  Patient voiced understanding.  she is advised that if her symptoms worsen or she develops new symptoms she should let us know.  She will follow-up in 6 months or sooner if needed.  I spent 15 minutes with the patient. 50% of this time was spent discussing her medication regimen.   Butch Penny, MSN, NP-C 04/01/2018, 7:54 AM [facility] [address], [address] [place], [place] [address] [phone]

## 2018-04-01 NOTE — Patient Instructions (Addendum)
Your Plan:  Continue Adderall XR 30 mg  Add Adderall IR 10 mg at lunch if needed If your symptoms worsen or you develop new symptoms please let us know.   Thank you for coming to see us at [facility]. I hope we have been able to provide you high quality care today.  You may receive a patient satisfaction survey over the next few weeks. We would appreciate your feedback and comments so that we may continue to improve ourselves and the health of our patients.

## 2018-04-26 NOTE — Progress Notes (Signed)
I agree with the assessment and plan as directed by NP .The patient is known to me .   Yi Haugan, MD  

## 2018-05-06 ENCOUNTER — Other Ambulatory Visit: Payer: Self-pay

## 2018-05-06 ENCOUNTER — Other Ambulatory Visit: Payer: Self-pay | Admitting: Adult Health

## 2018-05-06 DIAGNOSIS — Z6841 Body Mass Index (BMI) 40.0 and over, adult: Secondary | ICD-10-CM

## 2018-05-06 DIAGNOSIS — F988 Other specified behavioral and emotional disorders with onset usually occurring in childhood and adolescence: Secondary | ICD-10-CM

## 2018-05-06 DIAGNOSIS — G4710 Hypersomnia, unspecified: Secondary | ICD-10-CM

## 2018-05-06 DIAGNOSIS — G47411 Narcolepsy with cataplexy: Secondary | ICD-10-CM

## 2018-05-06 NOTE — Telephone Encounter (Signed)
Waiting on Megan to Send. MB RN

## 2018-05-06 NOTE — Telephone Encounter (Signed)
 Drug Registry checked. Adderall 10mg  last refill 04/01/2018 #15 authorizing provider was Tylene FantasiaMegan M NP.

## 2018-05-07 MED ORDER — AMPHETAMINE-DEXTROAMPHETAMINE 10 MG PO TABS: ORAL_TABLET | ORAL | 0 refills | Status: DC

## 2018-05-07 MED ORDER — AMPHETAMINE-DEXTROAMPHET ER 30 MG PO CP24: 30.0000 mg | ORAL_CAPSULE | Freq: Every day | ORAL | 0 refills | Status: DC

## 2018-06-18 ENCOUNTER — Other Ambulatory Visit: Payer: Self-pay | Admitting: Adult Health

## 2018-06-18 DIAGNOSIS — F988 Other specified behavioral and emotional disorders with onset usually occurring in childhood and adolescence: Secondary | ICD-10-CM

## 2018-06-18 DIAGNOSIS — G4710 Hypersomnia, unspecified: Secondary | ICD-10-CM

## 2018-06-18 DIAGNOSIS — Z6841 Body Mass Index (BMI) 40.0 and over, adult: Secondary | ICD-10-CM

## 2018-06-18 DIAGNOSIS — G47411 Narcolepsy with cataplexy: Secondary | ICD-10-CM

## 2018-06-18 MED ORDER — AMPHETAMINE-DEXTROAMPHET ER 30 MG PO CP24: 30.0000 mg | ORAL_CAPSULE | Freq: Every day | ORAL | 0 refills | Status: DC

## 2018-07-01 ENCOUNTER — Telehealth: Payer: Self-pay | Admitting: *Deleted

## 2018-07-01 NOTE — Telephone Encounter (Signed)
Received fax from [facility] [place] for Amphetamine-dextro. Per CMM, medication is covered. No PA needed.

## 2018-08-03 ENCOUNTER — Other Ambulatory Visit: Payer: Self-pay | Admitting: Adult Health

## 2018-08-03 DIAGNOSIS — G47411 Narcolepsy with cataplexy: Secondary | ICD-10-CM

## 2018-08-03 DIAGNOSIS — F988 Other specified behavioral and emotional disorders with onset usually occurring in childhood and adolescence: Secondary | ICD-10-CM

## 2018-08-03 DIAGNOSIS — Z6841 Body Mass Index (BMI) 40.0 and over, adult: Secondary | ICD-10-CM

## 2018-08-03 DIAGNOSIS — G4710 Hypersomnia, unspecified: Secondary | ICD-10-CM

## 2018-08-05 MED ORDER — AMPHETAMINE-DEXTROAMPHET ER 30 MG PO CP24: 30.0000 mg | ORAL_CAPSULE | Freq: Every day | ORAL | 0 refills | Status: DC

## 2018-08-05 MED ORDER — AMPHETAMINE-DEXTROAMPHETAMINE 10 MG PO TABS: ORAL_TABLET | ORAL | 0 refills | Status: DC

## 2018-08-29 ENCOUNTER — Other Ambulatory Visit: Payer: Self-pay | Admitting: *Deleted

## 2018-08-29 MED ORDER — TRAZODONE HCL 50 MG PO TABS: 50.0000 mg | ORAL_TABLET | Freq: Every day | ORAL | 5 refills | Status: DC

## 2018-09-23 ENCOUNTER — Other Ambulatory Visit: Payer: Self-pay | Admitting: Neurology

## 2018-09-23 DIAGNOSIS — Z6841 Body Mass Index (BMI) 40.0 and over, adult: Secondary | ICD-10-CM

## 2018-09-23 DIAGNOSIS — G4710 Hypersomnia, unspecified: Secondary | ICD-10-CM

## 2018-09-23 DIAGNOSIS — F988 Other specified behavioral and emotional disorders with onset usually occurring in childhood and adolescence: Secondary | ICD-10-CM

## 2018-09-23 DIAGNOSIS — G47411 Narcolepsy with cataplexy: Secondary | ICD-10-CM

## 2018-09-23 MED ORDER — AMPHETAMINE-DEXTROAMPHETAMINE 10 MG PO TABS: ORAL_TABLET | ORAL | 0 refills | Status: DC

## 2018-09-23 MED ORDER — AMPHETAMINE-DEXTROAMPHET ER 30 MG PO CP24: 30.0000 mg | ORAL_CAPSULE | Freq: Every day | ORAL | 0 refills | Status: DC

## 2018-09-23 NOTE — Telephone Encounter (Signed)
Checked Drug registryl.. Last fill 10-05-2017 # 30 for Adderall XR 30mg  and Adderall 10mg  prn lunch.  Has F/u 10-21-18 with MM/NP.

## 2018-10-21 ENCOUNTER — Telehealth: Payer: Self-pay | Admitting: *Deleted

## 2018-10-21 ENCOUNTER — Ambulatory Visit: Payer: Managed Care, Other (non HMO) | Admitting: Adult Health

## 2018-10-21 NOTE — Telephone Encounter (Signed)
Spoke with [name]. She has an appt. with Aundra Millet this am, but Aundra Millet is out of the office due to illness. Pt. sts. she is having more trouble with EDS; Adderall is less effective now.  Appt. r/s to 10/31/18, arrival time of 0845 for a 0915 appt .with Jessica./fim

## 2018-10-31 ENCOUNTER — Encounter: Payer: Self-pay | Admitting: Adult Health

## 2018-10-31 ENCOUNTER — Ambulatory Visit (INDEPENDENT_AMBULATORY_CARE_PROVIDER_SITE_OTHER): Payer: BLUE CROSS/BLUE SHIELD | Admitting: Adult Health

## 2018-10-31 VITALS — BP 121/62 | HR 79 | Ht 60.0 in | Wt 194.2 lb

## 2018-10-31 DIAGNOSIS — G47411 Narcolepsy with cataplexy: Secondary | ICD-10-CM

## 2018-10-31 NOTE — Patient Instructions (Signed)
Your Plan:  Continue Adderral XR 30mg  before you go to work. Try to take a nap in the morning before you go into work. You can take the adderral IR 10mg  in the morning if needed or take later in evening  Call mid next week if no beneift and we can consider initiating Xyrem      Thank you for coming to see [place] at [facility]. I hope we have been able to provide you high quality care today.  You may receive a patient satisfaction survey over the next few weeks. We would appreciate your feedback and comments so that we may continue to improve ourselves and the health of our patients.

## 2018-10-31 NOTE — Progress Notes (Signed)
PATIENT: [name] DOB: 27-Jan-1986  REASON FOR VISIT: follow up HISTORY FROM: patient  HISTORY OF PRESENT ILLNESS: Today 10/31/18: [name] is a 33 year old female with history of narcolepsy and is being seen today for follow-up visit.  After prior visit, recommended to continue on Adderall XR 30 mg with initiating Adderall IR 10 mg.  She endorses continued excessive daytime fatigue despite Adderall increase in difficulty waking up in the morning.  Her normal day consists of waking up at 6:30 AM and typically will take her Adderall XR around 6:45 AM.  Her work hours are between 12:15 PM and 9:15 PM.  During the time between awakening and going to work, her day consists of bringing her children to school and doing any errands or household chores.  She states she tries to keep her self busy as if she sits down she will fall asleep.  She will take her Adderall IR around lunchtime and has attempted an additional Adderall IR around 5 PM due to excessive fatigue.  She works at [employer] center.  She typically goes to bed between 10 PM and 10:15 PM after her dose of trazodone for insomnia.  She endorses receiving adequate night sleep and has attempted to not use trazodone at night but will be up all night due to insomnia.  She feels as though initial Adderall XR dose has not provided her any benefit and feels consistently tired.  She becomes tearful as her constant fatigue has increased her irritability and occasional word mixup when she is attempting to speak.  She is frustrated by this as when she was previously on Adderall she experienced great benefit but feels as though since restarting, she has not noticed any benefit.  She has tried armodafinil in the past but unfortunately experienced frequent migraines. She has not attempted use of any other medications for her narcolepsy.  She does feel slightly more depressed but feels as though this is due to her not receiving adequate sleep.  She has  attempted to take daytime naps especially on the weekends but feels as though she could just continue sleeping.  She does live at home with her husband and 2 small children.  Her husband endorses being a light sleeper as she does take trazodone and therefore her husband will get up in the middle the night if needed to care for the children.  She returns today for evaluation.  HISTORY 04/01/2018 MM: [name] is a 33 year old female with a history of narcolepsy.  She returns today for follow-up.  She is on Adderall.  She reports that when she initially started Adderall it was beneficial.  She states that now she is finding that she is falling asleep on her lunch break.  She works for [employer].  She reports that she has tolerated Adderall well.  Denies any heart palpitations.  Blood pressure has remained stable.  She denies depression.  Reports that she sleeps "ok" at night.  She remains on trazodone.  She returns today for evaluation.  HISTORY Interval history from 13 August 2017.  I have the pleasure of seeing [name] today, 33 years old, right-handed Caucasian female married mother of 2.  [name] had some positive news over the last 6 months, her stress level and therefore her depression has much improved.  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  She could not tolerate  armodafinil as generic or brand name Nuvigil because of frequent migraines.  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 Since she is starting a new job this month I would like for her to return to Adderall which according to her did not give her migraines.  She endorsed today a high-grade of fatigue at 57 points out of 63 possible points, and the Epworth Sleepiness Scale at 16 points out of 24 possible points.  She also reports no recent cataplectic attacks but this  seems to be mainly due to medication that could control cataplexy as well as to her own tendency to restrain her emotion.   REVIEW OF SYSTEMS: Out of a complete 14 system review of symptoms, the patient complains only of the following symptoms, see HPI and all other reviewed systems are negative.  See HPI  ALLERGIES: Allergies  Allergen Reactions  . Other Anaphylaxis and Other (See Comments)    Pt is allergic to balsamic vinaigrette.     . Soy Allergy Anaphylaxis  . Gluten Meal Diarrhea and Rash    Bloating   . Sumatriptan Rash  . Zolmitriptan Rash  . Ciprofloxacin Other (See Comments)    Reaction:  Burning of pts arm  . Nsaids Other (See Comments)    Pt states that she was told to avoid this class of medication.    . Topiramate Other (See Comments)    Reaction:  Unknown   . Adhesive [Tape] Itching and Rash  . Triptans Rash    HOME MEDICATIONS: Outpatient Medications Prior to Visit  Medication Sig Dispense Refill  . albuterol (PROVENTIL HFA;VENTOLIN HFA) 108 (90 Base) MCG/ACT inhaler Inhale 1-2 puffs into the lungs every 6 (six) hours as needed for wheezing or shortness of breath.    . Amphetamine-Dextroamphetamine (ADDERALL PO) Take 10 mg by mouth as needed.    Marland Kitchen amphetamine-dextroamphetamine (ADDERALL XR) 30 MG 24 hr capsule Adderall XR 30 mg capsule,extended release    . Armodafinil 200 MG TABS armodafinil 200 mg tablet    . EPINEPHrine (EPIPEN 2-PAK) 0.3 mg/0.3 mL IJ SOAJ injection Inject 0.3 mg into the muscle once as needed (for severe allergic reaction).    . metoprolol tartrate (LOPRESSOR) 50 MG tablet metoprolol tartrate 50 mg tablet    . mometasone-formoterol (DULERA) 200-5 MCG/ACT AERO Inhale 1 puff into the lungs daily as needed for shortness of breath.     . montelukast (SINGULAIR) 10 MG tablet Take by mouth.    . norgestrel-ethinyl estradiol (LO/OVRAL,CRYSELLE) 0.3-30 MG-MCG tablet Take 1 tablet by mouth at bedtime.     . traZODone (DESYREL) 50 MG tablet Take 1  tablet (50 mg total) by mouth at bedtime. 30 tablet 5  . amphetamine-dextroamphetamine (ADDERALL XR) 30 MG 24 hr capsule Take 1 capsule (30 mg total) by mouth daily. (Patient not taking: Reported on 10/31/2018) 30 capsule 0  . amphetamine-dextroamphetamine (ADDERALL) 10 MG tablet Take 1 tablet PO at lunch if needed. (Patient not taking: Reported on 10/31/2018) 30 tablet 0  . norgestrel-ethinyl estradiol (LO/OVRAL,CRYSELLE) 0.3-30 MG-MCG tablet Take by mouth.    . Norgestrel-Ethinyl Estradiol (LOW-OGESTREL PO) Low-Ogestrel (28) 0.3 mg-30 mcg tablet    . traZODone (DESYREL) 50 MG tablet trazodone 50 mg tablet     No facility-administered medications prior to visit.     PAST MEDICAL HISTORY: Past Medical History:  Diagnosis Date  . Anemia    takes Ferrous Sulfate daily  . Anxiety  takes Klonopin nightly  . ASTHMA NOS W/ACUTE EXACERBATION 01/05/2010   exercise induced  . Back pain   . Coarse tremors   . Degenerative disc disease    back  . Gallstones   . GERD (gastroesophageal reflux disease)    but doesn't take anything for it  . History of kidney stones   . History of migraine    last one a couple of days ago  . History of UTI    frequent  . IBS (irritable bowel syndrome)    doesn't take any meds  . Medullary sponge kidney 2007   kidneys can be sick but she isnt sick  . MIGRAINES, HX OF 06/24/2007  . Narcolepsy   . 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 OCD (obsessive compulsive disorder)    takes Prozac daily  . PYELONEPHRITIS 06/24/2007  . Seizures (HCC)    takes Lamictal daily;last seizure was in 11/14/10  . Urinary frequency     PAST SURGICAL HISTORY: Past Surgical History:  Procedure Laterality Date  . BACK SURGERY    . CHOLECYSTECTOMY N/A 12/02/2013   Procedure: LAPAROSCOPIC CHOLECYSTECTOMY WITH ATTEMPTED INTRAOPERATIVE CHOLANGIOGRAM;  Surgeon: Atilano Ina, MD;  Location: [facility] OR;  Service: General;  Laterality: N/A;  . LUMBAR LAMINECTOMY/DECOMPRESSION MICRODISCECTOMY  07/24/2012    Procedure: LUMBAR LAMINECTOMY/DECOMPRESSION MICRODISCECTOMY;  Surgeon: Jacki Cones, MD;  Location: [facility] ORS;  Service: Orthopedics;  Laterality: Right;  L5-S1  . WISDOM TOOTH EXTRACTION      FAMILY HISTORY: Family History  Problem Relation Age of Onset  . Heart disease Mother   . ADD / ADHD Sister   . Anesthesia problems Neg Hx     SOCIAL HISTORY: Social History   Socioeconomic History  . Marital status: Married    Spouse name: Not on file  . Number of children: 2  . Years of education: 17  . Highest education level: Not on file  Occupational History  . 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 Financial resource strain: Not on file  . Food insecurity:    Worry: Not on file    Inability: Not on file  . Transportation needs:    Medical: Not on file    Non-medical: Not on file  Tobacco Use  . Smoking status: Never Smoker  . Smokeless tobacco: Never Used  Substance and Sexual Activity  . Alcohol use: No    Alcohol/week: 0.0 standard drinks    Comment: occas. when not pregnant  . Drug use: No  . Sexual activity: Yes    Partners: Male    Comment: last sex  Aug 22 2014  Lifestyle  . Physical activity:    Days per week: Not on file    Minutes per session: Not on file  . Stress: Not on file  Relationships  . Social connections:    Talks on phone: Not on file    Gets together: Not on file    Attends religious service: Not on file    Active member of club or organization: Not on file    Attends meetings of clubs or organizations: Not on file    Relationship status: Not on file  . Intimate partner violence:    Fear of current or ex partner: Not on file    Emotionally abused: Not on file    Physically abused: Not on file    Forced sexual activity: Not on file  Other Topics  Concern  . Not on file  Social History Narrative   HSG, finished 2 years of college. single mom with 2 sons blake - Aug  '09, Elijah Dec '12. 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 Still lives with father of her sons with wedding plans on hold ( Jan '13)      PHYSICAL EXAM  Vitals:   10/31/18 0854  BP: 121/62  Pulse: 79  Weight: 194 lb 3.2 oz (88.1 kg)  Height: 5' (1.524 m)   Body mass index is 37.93 kg/m.  Generalized: Well developed, pleasant young Caucasian female, in no acute distress   Neurological examination  Mentation: Alert oriented to time, place, history taking. Follows all commands speech and language fluent Cranial nerve II-XII: Pupils were equal round reactive to light. Extraocular movements were full, visual field were full on confrontational test. Facial sensation and strength were normal. Uvula tongue midline. Head turning and shoulder shrug  were normal and symmetric. Motor: The motor testing reveals 5 over 5 strength of all 4 extremities. Good symmetric motor tone is noted throughout.  Sensory: Sensory testing is intact to soft touch on all 4 extremities. No evidence of extinction is noted.  Coordination: Cerebellar testing reveals good finger-nose-finger and heel-to-shin bilaterally.  Gait and station: Gait is normal.  Reflexes: Deep tendon reflexes are symmetric and normal bilaterally.   DIAGNOSTIC DATA (LABS, IMAGING, TESTING) - I reviewed patient records, labs, notes, testing and imaging myself where available.  Lab Results  Component Value Date   WBC 5.9 01/26/2018   HGB 13.1 01/26/2018   HCT 39.9 01/26/2018   MCV 87.1 01/26/2018   PLT 304 01/26/2018      Component Value Date/Time   NA 135 01/26/2018 1136   NA 141 10/30/2013 1615   K 4.1 01/26/2018 1136   CL 104 01/26/2018 1136   CO2 22 01/26/2018 1136   GLUCOSE 105 (H) 01/26/2018 1136   BUN 5 (L) 01/26/2018 1136   BUN 10 10/30/2013 1615   CREATININE 0.87 01/26/2018 1136   CREATININE 0.77 12/23/2013 0837   CALCIUM 9.4 01/26/2018 1136   PROT 7.3 01/26/2018 1136   PROT 7.2  10/30/2013 1615   ALBUMIN 3.7 01/26/2018 1136   ALBUMIN 4.3 10/30/2013 1615   AST 34 01/26/2018 1136   ALT 56 (H) 01/26/2018 1136   ALKPHOS 74 01/26/2018 1136   BILITOT 0.4 01/26/2018 1136   GFRNONAA >60 01/26/2018 1136   GFRAA >60 01/26/2018 1136    Lab Results  Component Value Date   VITAMINB12 275 10/30/2013   Lab Results  Component Value Date   TSH 1.53 12/07/2006      ASSESSMENT AND PLAN 33 y.o. year old female  has a past medical history of Anemia, Anxiety, ASTHMA NOS W/ACUTE EXACERBATION (01/05/2010), Back pain, Coarse tremors, Degenerative disc disease, Gallstones, GERD (gastroesophageal reflux disease), History of kidney stones, History of migraine, History of UTI, IBS (irritable bowel syndrome), Medullary sponge kidney (2007), MIGRAINES, HX OF (06/24/2007), Narcolepsy, Narcolepsy (05/18/2014), OCD (obsessive compulsive disorder), PYELONEPHRITIS (06/24/2007), Seizures (HCC), and Urinary frequency. here with:  1.  Narcolepsy  She continues to express no benefit with use of Adderall XR 30 mg and Adderall IR 10 mg.  After speaking with Dr. Vickey Huger, it was recommended for her to trial taking Adderall IR 10mg  upon awakening and take a midmorning nap prior to going into work and at that time take Adderall XR 30mg .  Recommended to trial this for approximately 1  week and if she continues to experience no benefit, consider initiating Xyrem.  Discussion with patient and husband regarding safety of increasing Adderall dosage and difficulty treating insomnia symptoms when she has 15-hour days.  As she does sit at her desk for work, recommended to stand frequently or to take frequent breaks as able to walk around office.  She will continue to monitor for any worsening depression.  She will call in the next week to update us on her fatigue symptoms.  She will follow-up in 6 months time or earlier if needed  I spent 15 minutes with the patient. 50% of this time was spent discussing her  medication regimen and when the best time is to take Adderall, educated on safety with Adderall medication, ways to help prevent ongoing daytime fatigue and possible initiation of Xyrem with education material provided for her to review.   George HughJessica Joriel Streety, AGNP-BC  [facility] [address] [place], [place] [id number]  Phone [phone] Fax [phone] Note: This document was prepared with digital dictation and possible smart phrase technology. Any transcriptional errors that result from this process are unintentional.

## 2018-11-05 ENCOUNTER — Other Ambulatory Visit: Payer: Self-pay | Admitting: Neurology

## 2018-11-05 ENCOUNTER — Telehealth: Payer: Self-pay | Admitting: Adult Health

## 2018-11-05 ENCOUNTER — Encounter: Payer: Self-pay | Admitting: Adult Health

## 2018-11-05 DIAGNOSIS — G4700 Insomnia, unspecified: Secondary | ICD-10-CM

## 2018-11-05 DIAGNOSIS — Z6841 Body Mass Index (BMI) 40.0 and over, adult: Secondary | ICD-10-CM

## 2018-11-05 DIAGNOSIS — G4710 Hypersomnia, unspecified: Secondary | ICD-10-CM

## 2018-11-05 DIAGNOSIS — Z79899 Other long term (current) drug therapy: Secondary | ICD-10-CM

## 2018-11-05 DIAGNOSIS — G47411 Narcolepsy with cataplexy: Secondary | ICD-10-CM

## 2018-11-05 DIAGNOSIS — F988 Other specified behavioral and emotional disorders with onset usually occurring in childhood and adolescence: Secondary | ICD-10-CM

## 2018-11-05 NOTE — Telephone Encounter (Signed)
Dr. Vickey Huger,  How would you like to handle this further?  Unsure of why switching IR dose to AM and XR dose to afternoon would cause these type of symptoms. Sharlynn Oliphant, NP

## 2018-11-05 NOTE — Telephone Encounter (Signed)
MyChart message from patient was also sent stating worsening fatigue with the switch of medications.  She is questioning whether something else besides Xyrem could be recommended for narcolepsy.  MyChart message will be forwarded to you

## 2018-11-05 NOTE — Telephone Encounter (Signed)
Orders are placed and I have sent a message informing her Dr. Vickey Huger would like to have labs completed.

## 2018-11-05 NOTE — Telephone Encounter (Signed)
Pt states ever since the day after NP Shanda BumpsJessica made the change to how she takes her Amphetamine-Dextroamphetamine (ADDERALL PO) and amphetamine-dextroamphetamine (ADDERALL XR) 30 MG 24 hr capsule her Narcolepsy symptoms have worsen, muscle spasms have resurfaced and her speech is slurred.  Please call, pt has asked this message be sent as urgent

## 2018-11-05 NOTE — Telephone Encounter (Addendum)
There is no easy explanation for these symptoms, the overall daily dose is the same, just the timing changed. I can not explain this. She can always switch back to the previous regimen.  CD

## 2018-11-05 NOTE — Telephone Encounter (Signed)
Lets get a CMET, CBC diff, and TSH. Only to be sure.  She should resume her previous regimen.

## 2018-11-06 ENCOUNTER — Other Ambulatory Visit (INDEPENDENT_AMBULATORY_CARE_PROVIDER_SITE_OTHER): Payer: Self-pay

## 2018-11-06 ENCOUNTER — Ambulatory Visit (INDEPENDENT_AMBULATORY_CARE_PROVIDER_SITE_OTHER): Payer: Self-pay | Admitting: Neurology

## 2018-11-06 DIAGNOSIS — G4710 Hypersomnia, unspecified: Secondary | ICD-10-CM

## 2018-11-06 DIAGNOSIS — Z0289 Encounter for other administrative examinations: Secondary | ICD-10-CM

## 2018-11-06 DIAGNOSIS — G47411 Narcolepsy with cataplexy: Secondary | ICD-10-CM

## 2018-11-06 DIAGNOSIS — G4700 Insomnia, unspecified: Secondary | ICD-10-CM

## 2018-11-06 DIAGNOSIS — F988 Other specified behavioral and emotional disorders with onset usually occurring in childhood and adolescence: Secondary | ICD-10-CM

## 2018-11-06 DIAGNOSIS — Z79899 Other long term (current) drug therapy: Secondary | ICD-10-CM

## 2018-11-06 DIAGNOSIS — Z6841 Body Mass Index (BMI) 40.0 and over, adult: Secondary | ICD-10-CM

## 2018-11-06 MED ORDER — SOLRIAMFETOL HCL 150 MG PO TABS: 150.0000 mg | ORAL_TABLET | ORAL | 0 refills | Status: DC

## 2018-11-06 MED ORDER — SOLRIAMFETOL HCL 75 MG PO TABS: 75.0000 mg | ORAL_TABLET | ORAL | 0 refills | Status: DC

## 2018-11-07 LAB — BASIC METABOLIC PANEL
BUN/Creatinine Ratio: 9 (ref 9–23)
BUN: 8 mg/dL (ref 6–20)
CO2: 22 mmol/L (ref 20–29)
Calcium: 9.3 mg/dL (ref 8.7–10.2)
Chloride: 104 mmol/L (ref 96–106)
Creatinine, Ser: 0.89 mg/dL (ref 0.57–1.00)
GFR calc Af Amer: 99 mL/min/{1.73_m2} (ref >59)
GFR calc non Af Amer: 86 mL/min/{1.73_m2} (ref >59)
Glucose: 122 mg/dL — ABNORMAL HIGH (ref 65–99)
Potassium: 4.2 mmol/L (ref 3.5–5.2)
Sodium: 140 mmol/L (ref 134–144)

## 2018-11-07 LAB — CBC
Hematocrit: 40.5 % (ref 34.0–46.6)
Hemoglobin: 13.3 g/dL (ref 11.1–15.9)
MCH: 28.7 pg (ref 26.6–33.0)
MCHC: 32.8 g/dL (ref 31.5–35.7)
MCV: 88 fL (ref 79–97)
Platelets: 331 10*3/uL (ref 150–450)
RBC: 4.63 x10E6/uL (ref 3.77–5.28)
RDW: 12.2 % (ref 11.7–15.4)
WBC: 5.5 10*3/uL (ref 3.4–10.8)

## 2018-11-07 LAB — TSH: TSH: 2.09 u[IU]/mL (ref 0.450–4.500)

## 2018-11-08 ENCOUNTER — Encounter: Payer: Self-pay | Admitting: *Deleted

## 2018-11-19 ENCOUNTER — Other Ambulatory Visit: Payer: Self-pay | Admitting: Neurology

## 2018-11-19 DIAGNOSIS — Z0289 Encounter for other administrative examinations: Secondary | ICD-10-CM

## 2018-11-19 MED ORDER — SOLRIAMFETOL HCL 150 MG PO TABS: 150.0000 mg | ORAL_TABLET | ORAL | 5 refills | Status: DC

## 2018-11-20 ENCOUNTER — Telehealth: Payer: Self-pay | Admitting: Neurology

## 2018-11-20 NOTE — Telephone Encounter (Signed)
PA submitted through cover my meds/express scripts.  KEY:[id number] Approved until 11/16/2019

## 2018-11-21 ENCOUNTER — Telehealth: Payer: Self-pay

## 2018-11-21 NOTE — Telephone Encounter (Signed)
FMLA for 11/05/2018 to 11/08/2018 only. Fee paid 50 dollars. Given to [facility] for faxing.

## 2018-11-25 NOTE — Telephone Encounter (Signed)
I faxed form to [facility] @ [phone]

## 2018-11-27 NOTE — Telephone Encounter (Signed)
I called pt and she is not sure what numbers are needed by question 7 and 8 on form. Pt advise I call the toll free number to find out.

## 2018-11-27 NOTE — Telephone Encounter (Addendum)
II called [facility] at [phone] about needing number 7 and 8 corrected. I stated number seven was updated with 1 appointment every 3 to 6 months,and 2 hours per appt. I stated on number 8 we are only approving illness out of work from 11/05/2018 to 11/08/2018. This was what pt needed and the NP is aware. They stated pt was reporting  out of work other days after 11/08/2018,and is reporting other absences. I I stated we are only approving 11/05/2018 to 11/08/2018 per NP suggestion when the form was done. Also pt is giving 2 hours for appt every 3 to 6 months.Form revised and initial will be fax back.

## 2018-11-27 NOTE — Telephone Encounter (Signed)
Pt advised FMLA needs correction on the following: #7, #8 needs to completed with numerical answer, and initial and date Resend by 3/16.

## 2018-11-27 NOTE — Telephone Encounter (Signed)
Form refax and confirmed.

## 2018-12-04 ENCOUNTER — Other Ambulatory Visit: Payer: Self-pay | Admitting: Neurology

## 2018-12-04 ENCOUNTER — Encounter: Payer: Self-pay | Admitting: Adult Health

## 2018-12-04 MED ORDER — SOLRIAMFETOL HCL 75 MG PO TABS: 75.0000 mg | ORAL_TABLET | Freq: Two times a day (BID) | ORAL | 5 refills | Status: DC

## 2018-12-05 ENCOUNTER — Telehealth: Payer: Self-pay | Admitting: Neurology

## 2018-12-05 NOTE — Telephone Encounter (Signed)
Pa submitted through cover my meds/express scripts for Sunosis 75 mg BID. VBT:[id number] Will wait for response

## 2018-12-05 NOTE — Telephone Encounter (Signed)
PA approved for the patient for Sunosi 75 mg BID 11/05/2018;Coverage End Date:12/05/2019;

## 2018-12-05 NOTE — Telephone Encounter (Signed)
Pt called,  requesting additional forms to be fill out  For her illness, the new medication  making the pt sick. Pt insurance company request a re certification form to be fill out for absences past 11/08/18.Pt form need to be turn in by March 21,2020

## 2018-12-05 NOTE — Telephone Encounter (Signed)
FMLA form was sent-office requesting revision as patient apparently had additional absences past 11/08/2018.  Patient initially received samples in order to trial Sunosi which appears per chart review to have been initiated on 11/06/2018.  Per review of chart, Sunosi has greatly benefited patient in regards to her narcolepsy.  It will need to be determined amount of additional days that were needed off from her work that extended past 11/08/2018 as current FMLA paperwork covers her for being out of work between 11/05/2018 to 11/08/2018.

## 2018-12-05 NOTE — Telephone Encounter (Signed)
Pt. last saw Shanda Bumps, and it looks like Shanda Bumps is already addressing this/fim

## 2018-12-09 ENCOUNTER — Encounter: Payer: Self-pay | Admitting: Adult Health

## 2019-02-07 ENCOUNTER — Ambulatory Visit (INDEPENDENT_AMBULATORY_CARE_PROVIDER_SITE_OTHER): Payer: BLUE CROSS/BLUE SHIELD | Admitting: Internal Medicine

## 2019-02-07 ENCOUNTER — Encounter: Payer: Self-pay | Admitting: Internal Medicine

## 2019-02-07 ENCOUNTER — Other Ambulatory Visit: Payer: Self-pay

## 2019-02-07 VITALS — BP 124/80 | HR 100 | Temp 98.0°F | Ht 60.0 in | Wt 210.0 lb

## 2019-02-07 DIAGNOSIS — R05 Cough: Secondary | ICD-10-CM

## 2019-02-07 DIAGNOSIS — R0609 Other forms of dyspnea: Secondary | ICD-10-CM

## 2019-02-07 DIAGNOSIS — R053 Chronic cough: Secondary | ICD-10-CM

## 2019-02-07 DIAGNOSIS — R9389 Abnormal findings on diagnostic imaging of other specified body structures: Secondary | ICD-10-CM

## 2019-02-07 DIAGNOSIS — J4520 Mild intermittent asthma, uncomplicated: Secondary | ICD-10-CM

## 2019-02-07 MED ORDER — FAMOTIDINE 20 MG PO TABS: ORAL_TABLET | ORAL | 11 refills | Status: DC

## 2019-02-07 MED ORDER — MOMETASONE FURO-FORMOTEROL FUM 100-5 MCG/ACT IN AERO: 2.0000 | INHALATION_SPRAY | Freq: Two times a day (BID) | RESPIRATORY_TRACT | 11 refills | Status: DC

## 2019-02-07 MED ORDER — ACETAMINOPHEN-CODEINE #3 300-30 MG PO TABS: 1.0000 | ORAL_TABLET | ORAL | 0 refills | Status: AC | PRN

## 2019-02-07 MED ORDER — MOMETASONE FURO-FORMOTEROL FUM 100-5 MCG/ACT IN AERO: 2.0000 | INHALATION_SPRAY | Freq: Two times a day (BID) | RESPIRATORY_TRACT | 0 refills | Status: DC

## 2019-02-07 MED ORDER — PANTOPRAZOLE SODIUM 40 MG PO TBEC: 40.0000 mg | DELAYED_RELEASE_TABLET | Freq: Every day | ORAL | 2 refills | Status: DC

## 2019-02-07 NOTE — Progress Notes (Signed)
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 and did better  but never back to nl = could walk a mile s stopping slow pace but  struggled to do hills says was told this was due to  "asthma and allergy" and never restored to nl baseline with "outdoor allergies" despite dulera /singulair then acutely worse in march 33th 2020 suddenly short of breath/bad coughing fits  with minimal activity,aching all over  Fever x one week > [facility] PCP with initial  covid testing ng > self isolated   > repeat swab neg >  Fever, aches gone by return to PCP  then started pred/zpak > cxr 01/24/2019 neg so CTa > abnormal so referred to pulmonary clinic 02/07/2019 by Huey Romans PA    History of Present Illness  02/07/2019  Pulmonary/ 1st office eval/Rosalia Mcavoy  New cough/ 33 worse  Chief Complaint  Patient presents with   Pulmonary Consult    Referred by [name], PA for eval of cough and SOB since March 2020.  Dyspnea:  MMRC3 = can't walk 100 yards even at a slow pace at a flat grade s stopping due to sob  / worse with voice use  Cough: worse with exertion/talking  Sleep: r sigde  down, flat bed no resp symptoms SABA use:  None x one week - no better  Cp s pleuritic / ex features always center of chest/ anterior, waxes and wanes x years   No obvious day to day or daytime variability or assoc excess/ purulent sputum or  mucus plugs or hemoptysis or   chest tightness, subjective wheeze or overt sinus or hb symptoms.   Sleeping fine  without nocturnal  or early am exacerbation  of respiratory  c/o's or need for noct saba. Also denies any obvious fluctuation of symptoms with weather or environmental changes or other aggravating or alleviating factors except as outlined above   No unusual exposure hx or h/o childhood pna or knowledge of premature birth.  Current Allergies, Complete Past Medical History, Past Surgical History, Family History, and Social History were reviewed in [employer] record.  ROS  The following are not active complaints unless bolded Hoarseness, sore throat, dysphagia, dental problems, itching, sneezing,  nasal congestion or discharge of excess mucus or purulent secretions, ear ache,   fever, chills, sweats, unintended wt loss or wt gain, classically pleuritic or exertional cp,  orthopnea pnd or arm/hand swelling  or leg swelling, presyncope, palpitations, abdominal pain, anorexia, nausea, vomiting, diarrhea  or change in bowel habits or change in bladder habits, change in stools or change in urine, dysuria, hematuria,  rash, arthralgias, visual complaints, headache, numbness, weakness or ataxia or problems with walking or coordination,  change in mood or  memory.  Past Medical History:  Diagnosis Date   Anemia    takes Ferrous Sulfate daily   Anxiety    takes Klonopin nightly   ASTHMA NOS W/ACUTE EXACERBATION 01/05/2010   exercise induced   Back pain    Coarse tremors    Degenerative disc disease    back   Gallstones    GERD (gastroesophageal reflux disease)    but doesn't take anything for it   History of kidney stones    History of migraine    last one a couple of days ago   History of UTI    frequent   IBS (irritable bowel syndrome)    doesn't take any meds   Medullary sponge kidney 2007   kidneys can be sick but she isnt  sick   MIGRAINES, HX OF 06/24/2007   Narcolepsy    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   OCD (obsessive compulsive disorder)    takes Prozac daily   PYELONEPHRITIS 06/24/2007   Seizures (HCC)    takes Lamictal daily;last seizure was in 11/14/10   Urinary frequency     Outpatient Medications Prior to Visit  Medication Sig Dispense Refill   albuterol (PROVENTIL HFA;VENTOLIN HFA) 108 (90 Base) MCG/ACT inhaler Inhale 1-2 puffs into the lungs every 6 (six) hours as needed for wheezing or shortness of breath.     doxycycline (ADOXA) 100 MG tablet Take 1 tablet by mouth 2 (two) times a day.     EPINEPHrine (EPIPEN 2-PAK) 0.3 mg/0.3 mL IJ SOAJ injection Inject 0.3 mg into the muscle once as needed (for severe allergic reaction).     mometasone-formoterol (DULERA) 200-5 MCG/ACT AERO Inhale 1 puff into the lungs daily as needed for shortness of breath.      montelukast (SINGULAIR) 10 MG tablet Take by mouth.     norgestrel-ethinyl estradiol (LO/OVRAL,CRYSELLE) 0.3-30 MG-MCG tablet Take 1 tablet by mouth at bedtime.      predniSONE (DELTASONE) 10 MG tablet As directed     Solriamfetol HCl (SUNOSI) 75 MG TABS Take 75 mg by mouth 2 (two) times daily. Take one tablet upon waking up and then another tablet 4 hrs later if needed 60 tablet 5   traZODone (DESYREL) 50 MG tablet Take 1 tablet (50 mg total) by mouth at bedtime. 30 tablet 5   Amphetamine-Dextroamphetamine (ADDERALL PO) Take 10 mg by mouth as needed.     amphetamine-dextroamphetamine (ADDERALL XR) 30 MG 24 hr capsule Adderall XR 30 mg capsule,extended release     Armodafinil 200 MG TABS armodafinil 200 mg tablet     metoprolol tartrate (LOPRESSOR) 50 MG tablet metoprolol tartrate 50 mg tablet        Objective:     BP 124/80 (BP Location: Left Arm, Cuff Size: Normal)    Pulse 100    Temp 98 F (36.7 C) (Oral)    Ht 5' (1.524 m)    Wt 210 lb (95.3 kg)    SpO2 99%    BMI 41.01 kg/m   SpO2: 99 %   RA   amb obese wf nad  HEENT: nl  dentition, turbinates bilaterally, and oropharynx. Nl external ear canals without cough reflex   NECK :  without JVD/Nodes/TM/ nl carotid upstrokes bilaterally   LUNGS: no acc muscle use,  Nl contour chest which is clear to A and P bilaterally with  Severe coughing fits on early insp   CV:  RRR  no s3 or murmur or increase in P2, and no edema   ABD:  soft  and nontender with nl inspiratory excursion in the supine position. No bruits or organomegaly appreciated, bowel sounds nl  MS:  Nl gait/ ext warm without deformities, calf tenderness, cyanosis or clubbing No obvious joint restrictions   SKIN: warm and dry without lesions    NEURO:  alert, approp, nl sensorium with  no motor or cerebellar deficits apparent.     I personally reviewed images and agree with radiology impression as follows:  Chest CTa 01/31/2019  1. 1. No evidence of pulmonary embolism.  2. Unchanged cluster of centrilobular groundglass nodules within the inferior right lower lobe, unchanged from CT from 02/12/2017.  3. Small amount of nonspecific hazy stranding within the anterior left upper lobe. This may be a site of mild infectious/inflammation.  4. Additional findings as detailed above.    Assessment   Chronic cough Onset March 2020 with viral illness and covid 19 neg pcr x 2  - max rx for cyclical coughing/ stop ics 02/07/2019    The most common causes of chronic cough in immunocompetent adults include the following: upper airway cough syndrome (UACS), previously referred to as postnasal drip syndrome (PNDS), which is caused by variety of rhinosinus conditions; (2) asthma; (3) GERD; (4) chronic bronchitis from cigarette smoking or other inhaled environmental irritants; (5) nonasthmatic eosinophilic bronchitis; and (6) bronchiectasis.   These conditions, singly or in combination, have accounted for up to 94% of the causes of chronic cough in prospective studies.   Other conditions have constituted no >6% of  the causes in prospective studies These have included bronchogenic carcinoma, chronic interstitial pneumonia, sarcoidosis, left ventricular failure, ACEI-induced cough, and aspiration from a condition associated with pharyngeal dysfunction.    Chronic cough is often simultaneously caused by more than one condition. A single cause has been found from 38 to 82% of the time, multiple causes from 18 to 62%. Multiply caused cough has been the result of three diseases up to 42% of the time.       Of the three most common causes of  Sub-acute / recurrent or chronic cough, only one (GERD)  can actually contribute to/ trigger  the other two (asthma and post nasal drip syndrome)  and perpetuate the cylce of cough.  While not intuitively obvious, many patients with chronic low grade reflux do not cough until there is a primary insult that disturbs the protective epithelial barrier and exposes sensitive nerve endings.   This is typically viral but can due to PNDS and  either may apply here.     >>> The point is that once this occurs, it is difficult to eliminate the cycle  using anything but a maximally effective acid suppression regimen at least in the short run, accompanied by an appropriate diet to address non acid GERD and control / eliminate the cough itself for at least 3 days with tylenol #3  Then f/u in 2 weeks with televisit        Mild intermittent asthma vs UACS/cyclical coughing  Onset 2017 though ? Vcd/panic disorder in childhood  - 02/07/2019  After extensive coaching inhaler device,  effectiveness =    75% from a baseline of < 50% so try change ics to dulera 100 2 bid prn    DDX of  difficult airways management almost all start with A and  include Adherence, Ace Inhibitors, Acid Reflux, Active Sinus Disease, Alpha 1 Antitripsin deficiency, Anxiety masquerading as Airways dz,  ABPA,  Allergy(esp in young), Aspiration (esp in elderly), Adverse effects of  meds,  Active smoking or vaping, A bunch  of PE's (a small clot burden can't cause this syndrome unless there is already severe underlying pulm or vascular dz with poor reserve) plus two Bs  = Bronchiectasis and Beta blocker use..and one C= CHF   Adherence is always the initial "prime suspect" and is a multilayered concern that requires a "trust but verify" approach in every patient - starting with knowing how to use medications, especially inhalers, correctly, keeping up with refills and understanding the fundamental difference between maintenance and prns vs those medications only taken for a very short course and then stopped and not refilled.  - see hfa teaching - if not better return with all meds in hand using a trust but verify approach to confirm accurate Medication  Reconciliation The principal here is that until we are certain that the  patients are doing what we've asked, it makes no sense to ask them to do more.   ? Allergy > consider repeat allergy testing for Eos/ IgE when off prednisone for over a week    ? Acid (or non-acid) GERD > always difficult to exclude as up to 75% of pts in some series report no assoc GI/ Heartburn symptoms> rec max (24h)  acid suppression and diet restrictions/ reviewed and instructions given in writing. (see cough)   ? Anxiety/panic disorder  > usually at the bottom of this list of usual suspects but should be much higher on this pt's based on H and P and note already on psychotropics and may interfere with adherence and also interpretation of response or lack thereof to symptom management which can be quite subjective.   ? Bronchiectasis > ruled out by recent CT chest    ? Beta blocker effects > unlikely with relatively low dose lopressor.     Abnormal CT of the chest Onset 01/01/27  FOB 11/26/17 neg study, neg for afb  - repeat 01/31/19 no change   Assured her this is not unusual finding in error of high def CT and not at all likely linked to cough or sob and no directed f/u  needed.     DOE (dyspnea on exertion) Onset 2017 assoc with wt gain PFT's 12/08/16 in care everywhere report= "nl" though no data on file  - 02/07/2019   Walked RA x one lap =  approx 250 ft - stopped due to severe cough to point of being dizzy with sob but no desats   Since cough limited her more than breathing and CT is so unimpressive with prior pfts ok will persue cough rx first but strongly suspect this is a wt gain/ conditioning issue and certainly not typical of EIA     Total time devoted to counseling  > 50 % of initial 60 min office visit:  review case with pt/ device teaching and  directly observed portions of ambulatory 02 saturation study/ which extended face to face time for this visit /  discussion of options/alternatives/ personally creating written customized instructions  in presence of pt  then going over those specific  Instructions directly with the pt including how to use all of the meds but in particular covering each new medication in detail and the difference between the maintenance= "automatic" meds and the prns using an action plan format for the latter (If this problem/symptom => do that organization reading Left to right).  Please see AVS from this visit for a full list of these instructions which I personally wrote for this  pt and  are unique to this visit.      Sandrea HughsMichael Najae Rathert, MD 02/07/2019

## 2019-02-07 NOTE — Patient Instructions (Addendum)
Finish your prednisone  For worse breathing restart dulera but take the 100 up to 2 pffs every 12 hours if needed   The key to effective treatment for your cough is eliminating the non-stop cycle of cough you're stuck in long enough to let your airway heal completely and then see if there is anything still making you cough once you stop the cough suppression, but this should take no more than 5 days to figure out  Take delsym two tsp every 12 hours and supplement if needed with  Tylenol #3   up to 1-2 every 4 hours to suppress the urge to cough. Swallowing water and/or using ice chips/non mint and menthol containing candies (such as lifesavers or sugarless jolly ranchers) are also effective.  You should rest your voice and avoid activities that you know make you cough.  Once you have eliminated the cough for 3 straight days try reducing the Tylenol #3 first,  then the delsym as tolerated.      Protonix (pantoprazole) Take 30-60 min before first meal of the day and Pepcid 20 mg one after supper automatically x at least two weeks   GERD (REFLUX)  is an extremely common cause of respiratory symptoms, many times with no significant heartburn at all.    It can be treated with medication, but also with lifestyle changes including avoidance of late meals, excessive alcohol, smoking cessation, and avoid fatty foods, chocolate, peppermint, colas, red wine, and acidic juices such as orange juice.  NO MINT OR MENTHOL PRODUCTS SO NO COUGH DROPS   USE HARD CANDY INSTEAD (jolley ranchers or Stover's or Lifesavers (all available in sugarless versions) NO OIL BASED VITAMINS - use powdered substitutes.   I will do televist in two weeks - ok to double book first thing in pm - call sooner if needed

## 2019-02-08 ENCOUNTER — Encounter: Payer: Self-pay | Admitting: Internal Medicine

## 2019-02-08 DIAGNOSIS — J4520 Mild intermittent asthma, uncomplicated: Secondary | ICD-10-CM | POA: Insufficient documentation

## 2019-02-08 DIAGNOSIS — R9389 Abnormal findings on diagnostic imaging of other specified body structures: Secondary | ICD-10-CM | POA: Insufficient documentation

## 2019-02-08 DIAGNOSIS — R0609 Other forms of dyspnea: Secondary | ICD-10-CM | POA: Insufficient documentation

## 2019-02-08 NOTE — Assessment & Plan Note (Signed)
Onset 01/01/27  FOB 11/26/17 neg study, neg for afb  - repeat 01/31/19 no change   Assured her this is not unusual finding in error of high def CT and not at all likely linked to cough or sob and no directed f/u needed.

## 2019-02-08 NOTE — Assessment & Plan Note (Addendum)
Onset 2017 assoc with wt gain PFT's 12/08/16 in care everywhere report= "nl" though no data on file  - 02/07/2019   Walked RA x one lap =  approx 250 ft - stopped due to severe cough to point of being dizzy with sob but no desats   Since cough limited her more than breathing and CT is so unimpressive with prior pfts ok will persue cough rx first but strongly suspect this is a wt gain/ conditioning issue and certainly not typical of EIA   Total time devoted to counseling  > 50 % of initial 60 min office visit:  review case with pt/ device teaching and  directly observed portions of ambulatory 02 saturation study/ which extended face to face time for this visit /  discussion of options/alternatives/ personally creating written customized instructions  in presence of pt  then going over those specific  Instructions directly with the pt including how to use all of the meds but in particular covering each new medication in detail and the difference between the maintenance= "automatic" meds and the prns using an action plan format for the latter (If this problem/symptom => do that organization reading Left to right).  Please see AVS from this visit for a full list of these instructions which I personally wrote for this pt and  are unique to this visit.

## 2019-02-08 NOTE — Assessment & Plan Note (Addendum)
Onset March 2020 with viral illness and covid 19 neg pcr x 2  - max rx for cyclical coughing/ stop ics 02/07/2019    The most common causes of chronic cough in immunocompetent adults include the following: upper airway cough syndrome (UACS), previously referred to as postnasal drip syndrome (PNDS), which is caused by variety of rhinosinus conditions; (2) asthma; (3) GERD; (4) chronic bronchitis from cigarette smoking or other inhaled environmental irritants; (5) nonasthmatic eosinophilic bronchitis; and (6) bronchiectasis.   These conditions, singly or in combination, have accounted for up to 94% of the causes of chronic cough in prospective studies.   Other conditions have constituted no >6% of the causes in prospective studies These have included bronchogenic carcinoma, chronic interstitial pneumonia, sarcoidosis, left ventricular failure, ACEI-induced cough, and aspiration from a condition associated with pharyngeal dysfunction.    Chronic cough is often simultaneously caused by more than one condition. A single cause has been found from 38 to 82% of the time, multiple causes from 18 to 62%. Multiply caused cough has been the result of three diseases up to 42% of the time.       Of the three most common causes of  Sub-acute / recurrent or chronic cough, only one (GERD)  can actually contribute to/ trigger  the other two (asthma and post nasal drip syndrome)  and perpetuate the cylce of cough.  While not intuitively obvious, many patients with chronic low grade reflux do not cough until there is a primary insult that disturbs the protective epithelial barrier and exposes sensitive nerve endings.   This is typically viral but can due to PNDS and  either may apply here.     >>> The point is that once this occurs, it is difficult to eliminate the cycle  using anything but a maximally effective acid suppression regimen at least in the short run, accompanied by an appropriate diet to address non acid  GERD and control / eliminate the cough itself for at least 3 days with tylenol #3  Then f/u in 2 weeks with televisit

## 2019-02-08 NOTE — Assessment & Plan Note (Addendum)
Onset 2017 though ? Vcd/panic disorder in childhood  - 02/07/2019  After extensive coaching inhaler device,  effectiveness =    75% from a baseline of < 50% so try change ics to dulera 100 2 bid prn    DDX of  difficult airways management almost all start with A and  include Adherence, Ace Inhibitors, Acid Reflux, Active Sinus Disease, Alpha 1 Antitripsin deficiency, Anxiety masquerading as Airways dz,  ABPA,  Allergy(esp in young), Aspiration (esp in elderly), Adverse effects of meds,  Active smoking or vaping, A bunch of PE's (a small clot burden can't cause this syndrome unless there is already severe underlying pulm or vascular dz with poor reserve) plus two Bs  = Bronchiectasis and Beta blocker use..and one C= CHF   Adherence is always the initial "prime suspect" and is a multilayered concern that requires a "trust but verify" approach in every patient - starting with knowing how to use medications, especially inhalers, correctly, keeping up with refills and understanding the fundamental difference between maintenance and prns vs those medications only taken for a very short course and then stopped and not refilled.  - see hfa teaching - if not better return with all meds in hand using a trust but verify approach to confirm accurate Medication  Reconciliation The principal here is that until we are certain that the  patients are doing what we've asked, it makes no sense to ask them to do more.   ? Allergy > consider repeat allergy testing for Eos/ IgE when off prednisone for over a week    ? Acid (or non-acid) GERD > always difficult to exclude as up to 75% of pts in some series report no assoc GI/ Heartburn symptoms> rec max (24h)  acid suppression and diet restrictions/ reviewed and instructions given in writing. (see cough)   ? Anxiety/panic disorder  > usually at the bottom of this list of usual suspects but should be much higher on this pt's based on H and P and note already on psychotropics  and may interfere with adherence and also interpretation of response or lack thereof to symptom management which can be quite subjective.   ? Bronchiectasis > ruled out by recent CT chest    ? Beta blocker effects > unlikely with relatively low dose lopressor.

## 2019-02-10 NOTE — Telephone Encounter (Signed)
Ok to take out of work 02/06/09 to 02/21/19 and we'll address this at [employer]

## 2019-02-10 NOTE — Telephone Encounter (Signed)
This message was sent to you this morning.   Dr. Sherene Sires,   I completely forgot to request a letter for work and discuss when I'll be able to return back to work when I saw you on 01-28-2019. 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 Since seeing you, I will need additional FMLA paperwork completed to extend out past the 15th. I have attached the documentation they require.   Thank you,   [name]    Attachments  02-07-2019    Message routed to Dr Sherene Sires to advise on work note  02/07/19 Instructions   Finish your prednisone  For worse breathing restart dulera but take the 100 up to 2 pffs every 12 hours if needed   The key to effective treatment for your cough is eliminating the non-stop cycle of cough you're stuck in long enough to let your airway heal completely and then see if there is anything still making you cough once you stop the cough suppression, but this should take no more than 5 days to figure out  Take delsym two tsp every 12 hours and supplement if needed with  Tylenol #3   up to 1-2 every 4 hours to suppress the urge to cough. Swallowing water and/or using ice chips/non mint and menthol containing candies (such as lifesavers or sugarless jolly ranchers) are also effective.  You should rest your voice and avoid activities that you know make you cough.  Once you have eliminated the cough for 3 straight days try reducing the Tylenol #3 first,  then the delsym as tolerated.      Protonix (pantoprazole) Take 30-60 min before first meal of the day and Pepcid 20 mg one after supper automatically x at least two weeks   GERD (REFLUX)  is an extremely common cause of respiratory symptoms, many times with no significant heartburn at all.    It can be treated with medication, but also with lifestyle changes including avoidance of late meals, excessive alcohol, smoking cessation, and avoid fatty foods, chocolate, peppermint, colas, red  wine, and acidic juices such as orange juice.  NO MINT OR MENTHOL PRODUCTS SO NO COUGH DROPS   USE HARD CANDY INSTEAD (jolley ranchers or Stover's or Lifesavers (all available in sugarless versions) NO OIL BASED VITAMINS - use powdered substitutes.   I will do televist in two weeks - ok to double book first thing in pm - call sooner if needed

## 2019-02-21 ENCOUNTER — Encounter: Payer: Self-pay | Admitting: Internal Medicine

## 2019-02-21 ENCOUNTER — Other Ambulatory Visit: Payer: Self-pay

## 2019-02-21 ENCOUNTER — Ambulatory Visit (INDEPENDENT_AMBULATORY_CARE_PROVIDER_SITE_OTHER): Payer: BLUE CROSS/BLUE SHIELD | Admitting: Internal Medicine

## 2019-02-21 DIAGNOSIS — R05 Cough: Secondary | ICD-10-CM | POA: Diagnosis not present

## 2019-02-21 DIAGNOSIS — R053 Chronic cough: Secondary | ICD-10-CM

## 2019-02-21 MED ORDER — PREDNISONE 10 MG PO TABS: ORAL_TABLET | ORAL | 0 refills | Status: DC

## 2019-02-21 MED ORDER — ACETAMINOPHEN-CODEINE #3 300-30 MG PO TABS: 1.0000 | ORAL_TABLET | ORAL | Status: AC | PRN

## 2019-02-21 MED ORDER — FAMOTIDINE 20 MG PO TABS: ORAL_TABLET | ORAL | 11 refills | Status: DC

## 2019-02-21 NOTE — Progress Notes (Signed)
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 and did better  but never back to nl = could walk a mile s stopping slow pace but  struggled to do hills says was told this was due to  "asthma and allergy" and never restored to nl baseline with "outdoor allergies" despite dulera /singulair then acutely worse in march 18th 2020 suddenly short of breath/bad coughing fits  with minimal activity,aching all over  Fever x one week > [facility] PCP with initial  covid testing ng > self isolated   > repeat swab neg >  Fever, aches gone by return to PCP  then started pred/zpak > cxr 01/24/2019 neg so CTa > abnormal so referred to pulmonary clinic 02/07/2019 by Huey Romans PA    History of Present Illness  02/07/2019  Pulmonary/ 1st office eval/Treon Kehl  New cough/ worse  Chief Complaint  Patient presents with  . Pulmonary Consult    Referred by [name], PA for eval of cough and SOB since March 2020.  Dyspnea:  MMRC3 = can't walk 100 yards even at a slow pace at a flat grade s stopping due to sob  / worse with voice use  Cough: worse with exertion/talking  Sleep: r side  down, flat bed no resp symptoms SABA use:  None x one week - no better  Cp s pleuritic / ex features always center of chest/ anterior, waxes and wanes x years rec Finish your prednisone For worse breathing restart dulera but take the 100 up to 2  pffs every 12 hours if needed  The key to effective treatment for your cough is eliminating the non-stop cycle of cough  Take delsym two tsp every 12 hours and supplement if needed with  Tylenol #3   up to 1-2 every 4 hours to suppress the urge to cough. Protonix (pantoprazole) Take 30-60 min before first meal of the day and Pepcid 20 mg one after supper automatically x at least two weeks  GERD diet / hard rock candy   Virtual Visit via Telephone Note 02/21/2019   I connected with [name] on 02/21/19 at  1:10  PM EDT by telephone and verified that I am speaking with the correct person using two identifiers.   I discussed the limitations, risks, security and privacy concerns of performing an evaluation and management service by telephone and the availability of in person appointments. I also discussed with the patient that there may be a patient responsible charge related to this service. The patient expressed understanding and agreed to proceed.   History of Present Illness: Bloating from pantoprazole and coughing from dulera  Dyspnea:  Only with coughing Cough: harsh dry day > noct Sleeping: ok p tyl #3  SABA use: none  02:  none    No obvious other patterns to day to day or daytime variability or assoc excess/ purulent sputum or mucus plugs or hemoptysis or cp or chest tightness, subjective wheeze or overt sinus or hb symptoms.    Also denies any obvious fluctuation of symptoms with weather or environmental changes or other aggravating or alleviating factors except as outlined above.   Meds reviewed/ med reconciliation completed      Past Medical History:  Diagnosis Date  . Anemia    takes Ferrous Sulfate daily  . Anxiety    takes Klonopin nightly  . ASTHMA NOS W/ACUTE EXACERBATION 01/05/2010   exercise induced  . Back pain   . Coarse tremors   . Degenerative disc disease    back  . Gallstones   . GERD (gastroesophageal reflux disease)    but doesn't take anything  for it  . History of kidney stones   . History of migraine    last one a couple of days ago  . History of UTI    frequent  . IBS (irritable bowel syndrome)    doesn't take any meds  . Medullary sponge kidney 2007   kidneys can be sick but she isnt sick  . MIGRAINES, HX OF 06/24/2007  . Narcolepsy   . 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 OCD (obsessive compulsive disorder)    takes Prozac daily  . PYELONEPHRITIS 06/24/2007  . Seizures (HCC)    takes Lamictal daily;last seizure was in 11/14/10  . Urinary frequency         Observations/Objective: Nl voice texture but honking quality dry sounding upper airway cough noted  Using caramel for lozenges      I personally reviewed images and agree with radiology impression as follows:   Chest CTa 01/31/2019 1. No evidence of pulmonary embolism. 2. Unchanged cluster of centrilobular groundglass nodules within the inferior right lower lobe, unchanged from CT from 02/12/2017. 3. Small amount of nonspecific hazy stranding within the anterior left upper lobe. This may be a site of mild infectious/inflammation. 4. Additional findings as detailed above   Assessment and Plan: See problem list for active a/p's   Follow Up Instructions: See avs for instructions unique to this ov which includes revised/ updated med list     I discussed the assessment and treatment plan with the patient. The patient was provided an opportunity to ask questions and all were answered. The patient agreed with the plan and demonstrated an understanding of the instructions.   The patient was advised to call back or seek an in-person evaluation if the symptoms worsen or if the condition fails to improve as anticipated.  I provided 25 minutes of non-face-to-face time during this encounter.   Sandrea HughsMichael Kele Barthelemy, MD

## 2019-02-21 NOTE — Assessment & Plan Note (Signed)
Onset March 2020 with viral illness and covid 19 neg pcr x 2  - max rx for cyclical coughing/ stop ics 02/07/2019   -  02/21/2019 still coughing but not doing cyclical cough regimen and ppi causing bloating so changed to h2 bid and repeat the protocol x 3 days then ov if not better with all meds in hand using a trust but verify approach to confirm accurate Medication  Reconciliation The principal here is that until we are certain that the  patients are doing what we've asked, it makes no sense to ask them to do more.   Each maintenance medication was reviewed in detail including most importantly the difference between maintenance and as needed and under what circumstances the prns are to be used.  Please see AVS for specific  Instructions which are unique to this visit and I personally typed out  which were reviewed in detail over the phone with the patient and a copy provided via MyChart

## 2019-02-21 NOTE — Patient Instructions (Addendum)
Patient has mychart  Stop pantoprazole   Increase the pepcid to 20 mg Take 30- 60 min bfast your first and last meals of the day   Ok to stop the dulera   Take delsym two tsp every 12 hours and supplement if needed with  Tylenol #3  up to 1-2 every 4 hours to suppress the urge to cough. Swallowing water and/or using ice chips/non mint and menthol containing candies (such as lifesavers or sugarless jolly ranchers) are also effective.  You should rest your voice and avoid activities that you know make you cough.  Once you have eliminated the cough for 3 straight days try reducing the Tylenol #3 first,  then the delsym as tolerated.      If not much better by June 1 call for follow up appt but please bring all active medications with you

## 2019-02-27 ENCOUNTER — Other Ambulatory Visit: Payer: Self-pay | Admitting: Adult Health

## 2019-03-04 ENCOUNTER — Telehealth: Payer: BLUE CROSS/BLUE SHIELD | Admitting: Primary Care

## 2019-03-05 ENCOUNTER — Other Ambulatory Visit: Payer: Self-pay

## 2019-03-05 ENCOUNTER — Telehealth (INDEPENDENT_AMBULATORY_CARE_PROVIDER_SITE_OTHER): Payer: BC Managed Care – PPO | Admitting: Primary Care

## 2019-03-05 DIAGNOSIS — J4520 Mild intermittent asthma, uncomplicated: Secondary | ICD-10-CM | POA: Diagnosis not present

## 2019-03-05 MED ORDER — BECLOMETHASONE DIPROP HFA 40 MCG/ACT IN AERB: 2.0000 | INHALATION_SPRAY | Freq: Two times a day (BID) | RESPIRATORY_TRACT | 3 refills | Status: DC

## 2019-03-05 MED ORDER — LEVALBUTEROL TARTRATE 45 MCG/ACT IN AERO: 1.0000 | INHALATION_SPRAY | Freq: Four times a day (QID) | RESPIRATORY_TRACT | 3 refills | Status: DC | PRN

## 2019-03-05 NOTE — Progress Notes (Signed)
Virtual Visit via Telephone Note  I connected with [name] on 03/05/19 at  9:00 AM EDT by telephone and verified that I am speaking with the correct person using two identifiers.  Location: Patient: Home Provider: Office   I discussed the limitations, risks, security and privacy concerns of performing an evaluation and management service by telephone and the availability of in person appointments. I also discussed with the patient that there may be a patient responsible charge related to this service. The patient expressed understanding and agreed to proceed.   History of Present Illness: 33 year old female, never smoked. PMH significant for asthma, UACS. Patient of Dr. Melvyn Novas, last seen on 02/21/19. Dulera decreased to 134mcg 2 puffs twice daily. Started on Protonix 40mg  daily and pepcid 20mg  at dinner. Given prescription for tylenol #3 prn cough.   03/05/2019 Patient called today for televisit. States that she was instructed to call our office this week and let us know how she was doing. She continues to have shortness of breath since March. Associated dry cough and chest tightness (but no wheezing). She felt better when taking 40mg  prednisone and symptoms returned when she began to taper dose. She is not taking Dulera 100 because it made her heart race. Cough is some better but reports a tickling sensation in the back of her throat causing her to clear her throat. She is trying very hard to suppress cough. Using [employer] Delsym cough syrup did not help. Currently only on pepcid twice daily. She has used flonase nasal spray before but cause burning sensation in her nose so she stopped.  Not taking Singulair.   Observations/Objective:  - No shortness of breath, wheezing or cough observed during phone conversation  Assessment and Plan:  Mild intermittent asthma vs UACS - Trial daily ICS maintenance inhaler- Qvar 40 mcg take two puffs twice daily - Change SABA to Xopenex hfa prn  -  Restart Singulair 10mg  at bedtime  Chronic cough  - Continue famotidine 20mg  twice daily  - Advised ocean nasal spray twice daily - Continue voice rest, sugar free candies to avoid clearing throat  Follow Up Instructions:  - Follow-up with Dr. Melvyn Novas in 2 weeks   I discussed the assessment and treatment plan with the patient. The patient was provided an opportunity to ask questions and all were answered. The patient agreed with the plan and demonstrated an understanding of the instructions.   The patient was advised to call back or seek an in-person evaluation if the symptoms worsen or if the condition fails to improve as anticipated.  I provided 20 minutes of non-face-to-face time during this encounter.   Martyn Ehrich, NP

## 2019-03-05 NOTE — Patient Instructions (Addendum)
Restart singulair 10mg  at bedtime  Ocean nasal spray twice daily   Continue pepcid twice daily   Trial Qvar 2 puffs twice daily   Back up- xopenex rescue inhaler 2 puffs every 6 hours as needed for shortness of breath/wheezing   Follow-up with Dr. Melvyn Novas in 2 weeks

## 2019-03-14 ENCOUNTER — Telehealth: Payer: Self-pay | Admitting: Internal Medicine

## 2019-03-17 NOTE — Telephone Encounter (Signed)
Done and given to [facility], thanks

## 2019-03-17 NOTE — Telephone Encounter (Signed)
Rec'd completed paperwork - Fwd to [facility] via interoffice mail -pr  °

## 2019-04-11 NOTE — Telephone Encounter (Signed)
Ov asap with all meds in hand, nothing else to offer over the phone, ok to overbook any late afternoon x thursdays

## 2019-04-11 NOTE — Telephone Encounter (Signed)
E-mail received from patient  Responded to question with additional questions: are you having trouble remembering to take the Pepcid both times per day? Or just having a hard time remembering the 1st or 2nd dose? Also, when you go outside are you noticing any chest tightness, increased shortness of breath, wheezing? Or just the cough? Is it dry and hacky or are you getting anything up with it?   Will go ahead and forward patient's message to Dr Melvyn Novas to see if there are any recommendations he can offer while we await patient's responses to the questions above.

## 2019-04-11 NOTE — Telephone Encounter (Signed)
Patient responded to my additional questions via e-mail:  [name] to Tanda Rockers, MD  9:52 AM [name],   I have a hard time remembering to take it both times.   I experience slight chest tightness, shortness of breath, and coughing.  The cough is not productive at all.  When I do cough my chest hurts.

## 2019-04-11 NOTE — Telephone Encounter (Signed)
Called spoke with patient Appt scheduled with MW for 7.23.2020 @ 1345 with all meds in hand  Nothing further needed at this time; will sign off

## 2019-04-17 ENCOUNTER — Encounter: Payer: Self-pay | Admitting: Internal Medicine

## 2019-04-17 ENCOUNTER — Other Ambulatory Visit: Payer: Self-pay

## 2019-04-17 ENCOUNTER — Ambulatory Visit (INDEPENDENT_AMBULATORY_CARE_PROVIDER_SITE_OTHER): Payer: BC Managed Care – PPO | Admitting: Internal Medicine

## 2019-04-17 DIAGNOSIS — Z6841 Body Mass Index (BMI) 40.0 and over, adult: Secondary | ICD-10-CM

## 2019-04-17 DIAGNOSIS — R05 Cough: Secondary | ICD-10-CM

## 2019-04-17 DIAGNOSIS — J4520 Mild intermittent asthma, uncomplicated: Secondary | ICD-10-CM

## 2019-04-17 DIAGNOSIS — R053 Chronic cough: Secondary | ICD-10-CM

## 2019-04-17 MED ORDER — PREDNISONE 10 MG PO TABS: ORAL_TABLET | ORAL | 0 refills | Status: DC

## 2019-04-17 MED ORDER — FAMOTIDINE 20 MG PO TABS: ORAL_TABLET | ORAL | Status: DC

## 2019-04-17 MED ORDER — RABEPRAZOLE SODIUM 20 MG PO TBEC: DELAYED_RELEASE_TABLET | ORAL | 2 refills | Status: DC

## 2019-04-17 NOTE — Progress Notes (Signed)
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 and did better  but never back to nl = could walk a mile s stopping slow pace but  struggled to do hills says was told this was due to  "asthma and allergy" and never restored to nl baseline with "outdoor allergies" despite dulera /singulair then acutely worse in march 18th 2020 suddenly short of breath/bad coughing fits  with minimal activity,aching all over  Fever x one week > [facility] PCP with initial  covid testing ng > self isolated   > repeat swab neg >  Fever, aches gone by return to PCP  then started pred/zpak > cxr 01/24/2019 neg so CTa > abnormal so referred to pulmonary clinic 02/07/2019 by [name]    History of Present Illness  02/07/2019  Pulmonary/ 1st office eval/[name]  New cough/ worse  Since March 2020 Chief Complaint  Patient presents with  . Pulmonary Consult    Referred by [name] for eval of cough and SOB since March 2020.  Dyspnea:  MMRC3 = can't walk 100 yards even at a slow pace at a flat grade s stopping due to sob  / worse with voice use  Cough: worse with exertion/talking  Sleep: r sigde  down, flat bed no resp symptoms SABA use:  None x one week - no better  Cp s pleuritic / ex features always center of chest/ anterior, waxes and wanes x years rec Finish your prednisone For worse breathing restart  dulera but take the 100 up to 2 pffs every 12 hours if needed  Take delsym two tsp every 12 hours and supplement if needed with  Tylenol #3   up to 1-2 every 4 hours to suppress the urge to cough.  Once you have eliminated the cough for 3 straight days try reducing the Tylenol #3 first,  then the delsym as tolerated.     Protonix (pantoprazole) Take 30-60 min before first meal of the day and Pepcid 20 mg one after supper automatically x at least two weeks  GERD diet    04/17/2019  f/u ov/[name] re: cough daily / intol to protonix = bloating/ did not bring meds as req  Chief Complaint  Patient presents with  . Acute Visit    She has had increased reflux "burning in her throat".  She does not do well out in the heat with her breathing. She uses her albuterol inhaler at least 2 puffs per day.    Dyspnea:  MMRC1 = can walk nl pace, flat grade, can't hurry or go uphills or steps s sob   Cough: none, sensation of globus/ grinding airway "to keep from coughing"  Sleeping: on R side flat  SABA use: variable but  mostly when overdoes it  02: none    No obvious day to day or daytime variability or assoc excess/ purulent sputum or mucus plugs or hemoptysis or cp or chest tightness, subjective wheeze or overt sinus  symptoms.   98 without nocturnal  or early am exacerbation  of respiratory  c/o's or need for noct saba. Also denies any obvious fluctuation of symptoms with weather or environmental changes or other aggravating or alleviating factors except as outlined above   No unusual exposure hx or h/o childhood pna  or knowledge of premature birth.  Current Allergies, Complete Past Medical History, Past Surgical History, Family History, and Social History were reviewed in [name].  ROS  The following are not active complaints unless bolded Hoarseness, sore throat, dysphagia, dental problems, itching, sneezing,  nasal congestion or discharge of excess mucus or purulent  secretions, ear ache,   fever, chills, sweats, unintended wt loss or wt gain, classically pleuritic or exertional cp,  orthopnea pnd or arm/hand swelling  or leg swelling, presyncope, palpitations, abdominal pain, anorexia, nausea, vomiting, diarrhea  or change in bowel habits or change in bladder habits, change in stools or change in urine, dysuria, hematuria,  rash, arthralgias, visual complaints, headache, numbness, weakness or ataxia or problems with walking or coordination,  change in mood or  memory.        Current Meds  Medication Sig  . albuterol (VENTOLIN HFA) 108 (90 Base) MCG/ACT inhaler Inhale 2 puffs into the lungs every 4 (four) hours as needed.  . beclomethasone (QVAR REDIHALER) 40 MCG/ACT inhaler Inhale 2 puffs into the lungs 2 (two) times daily.  Marland Kitchen. EPINEPHrine (EPIPEN 2-PAK) 0.3 mg/0.3 mL IJ SOAJ injection Inject 0.3 mg into the muscle once as needed (for severe allergic reaction).  . famotidine (PEPCID) 20 MG tablet One after bfast and supper  . montelukast (SINGULAIR) 10 MG tablet Take by mouth.  . norgestrel-ethinyl estradiol (LO/OVRAL,CRYSELLE) 0.3-30 MG-MCG tablet Take 1 tablet by mouth at bedtime.   . Solriamfetol HCl (SUNOSI) 75 MG TABS Take 75 mg by mouth 2 (two) times daily. Take one tablet upon waking up and then another tablet 4 hrs later if needed  . traZODone (DESYREL) 50 MG tablet TAKE 1 TABLET BY MOUTH EVERYDAY AT BEDTIME              Past Medical History:  Diagnosis Date  . Anemia    takes Ferrous Sulfate daily  . Anxiety    takes Klonopin nightly  . ASTHMA NOS W/ACUTE EXACERBATION 01/05/2010   exercise induced  . Back pain   . Coarse tremors   . Degenerative disc disease    back  . Gallstones   . GERD (gastroesophageal reflux disease)    but doesn't take anything for it  . History of kidney stones   . History of migraine    last one a couple of days ago  . History of UTI    frequent  . IBS (irritable bowel syndrome)    doesn't take any meds   . Medullary sponge kidney 2007   kidneys can be sick but she isnt sick  . MIGRAINES, HX OF 06/24/2007  . Narcolepsy   . 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 OCD (obsessive compulsive disorder)    takes Prozac daily  . PYELONEPHRITIS 06/24/2007  . Seizures (HCC)    takes Lamictal daily;last seizure was in 11/14/10  . Urinary frequency        Objective:  amb wf nad with freq throat clearing   Wt Readings from Last 3 Encounters:  04/17/19 220 lb 12.8 oz (100.2 kg)  02/07/19 210 lb (95.3 kg)  10/31/18 194 lb 3.2 oz (88.1 kg)     Vital signs reviewed - Note on arrival 02 sats  98% on RA      HEENT: nl dentition, turbinates bilaterally, and oropharynx. Nl external ear canals without cough reflex   NECK :  without JVD/Nodes/TM/ nl carotid upstrokes bilaterally   LUNGS: no acc muscle use,  Nl contour chest which is clear to A and P bilaterally without cough on insp or exp maneuvers   CV:  RRR  no s3 or murmur or increase in P2, and no edema   ABD:  soft and nontender with nl inspiratory excursion in the supine position. No bruits or organomegaly appreciated, bowel sounds nl  MS:  Nl gait/ ext warm without deformities, calf tenderness, cyanosis or clubbing No obvious joint restrictions   SKIN: warm and dry without lesions    NEURO:  alert, approp, nl sensorium with  no motor or cerebellar deficits apparent.         Assessment

## 2019-04-17 NOTE — Patient Instructions (Addendum)
Aciphex 20 mg Take 30- 60 min before your first and last meals of the day and pepcid 20 mg at bedtime   GERD (REFLUX)  is an extremely common cause of respiratory symptoms just like yours , many times with no obvious heartburn at all.    It can be treated with medication, but also with lifestyle changes including elevation of the head of your bed (ideally with 6 -8inch blocks under the headboard of your bed),  Smoking cessation, avoidance of late meals, excessive alcohol, and avoid fatty foods, chocolate, peppermint, colas, red wine, and acidic juices such as orange juice.  NO MINT OR MENTHOL PRODUCTS SO NO COUGH DROPS  USE SUGARLESS CANDY INSTEAD (Jolley ranchers or Stover's or Life Savers) or even ice chips will also do - the key is to swallow to prevent all throat clearing. NO OIL BASED VITAMINS - use powdered substitutes.  Avoid fish oil when coughing.   Prednisone 10 mg take  4 each am x 2 days,   2 each am x 2 days,  1 each am x 2 days and stop   For cough > delsym 2 tsp every 12 hours as needed   Please schedule a follow up office visit in 4 weeks, sooner if needed

## 2019-04-18 ENCOUNTER — Other Ambulatory Visit: Payer: Self-pay | Admitting: Internal Medicine

## 2019-04-18 ENCOUNTER — Encounter: Payer: Self-pay | Admitting: Internal Medicine

## 2019-04-18 MED ORDER — RABEPRAZOLE SODIUM 20 MG PO TBEC: 20.0000 mg | DELAYED_RELEASE_TABLET | Freq: Two times a day (BID) | ORAL | 2 refills | Status: DC

## 2019-04-18 NOTE — Assessment & Plan Note (Signed)
Body mass index is 43.12 kg/m.  -  trending up Lab Results  Component Value Date   TSH 2.090 11/06/2018     Contributing to gerd risk esp while on bcps/ doe/reviewed the need and the process to achieve and maintain neg calorie balance > defer f/u primary care including intermittently monitoring thyroid status     I had an extended discussion with the patient reviewing all relevant studies completed to date and  lasting 15 to 20 minutes of a 25 minute visit    I performed detailed device teaching using a teach back method which extended face to face time for this visit (see above)  Each maintenance medication was reviewed in detail including emphasizing most importantly the difference between maintenance and prns and under what circumstances the prns are to be triggered using an action plan format that is not reflected in the computer generated alphabetically organized AVS which I have not found useful in most complex patients, especially with respiratory illnesses  Please see AVS for specific instructions unique to this visit that I personally wrote and verbalized to the the pt in detail and then reviewed with pt  by my nurse highlighting any  changes in therapy recommended at today's visit to their plan of care.

## 2019-04-18 NOTE — Assessment & Plan Note (Signed)
Onset March 2020 with viral illness and covid 19 neg pcr x 2  - max rx for cyclical coughing/ stop ics 02/07/2019  -  02/21/2019 still coughing but not doing cyclical cough regimen and ppi causing bloating so changed to h2 bid and repeat the protocol x 3 days then ov > 04/17/2019 still throat clearing - 04/17/2019 changed to aciphex bid     Of the three most common causes of  Sub-acute / recurrent or chronic cough, only one (GERD)  can actually contribute to/ trigger  the other two (asthma and post nasal drip syndrome)  and perpetuate the cylce of cough.  While not intuitively obvious, many patients with chronic low grade reflux do not cough until there is a primary insult that disturbs the protective epithelial barrier and exposes sensitive nerve endings.   This is typically viral but can due to PNDS and  either may apply here.   The point is that once this occurs, it is difficult to eliminate the cycle  using anything but a maximally effective acid suppression regimen at least in the short run, accompanied by an appropriate diet to address non acid GERD and control / eliminate the cough itself for at least 3 days with delsym and non-mint/menthol hard rock candies then if not better return with all meds in hand using a trust but verify approach to confirm accurate Medication  Reconciliation The principal here is that until we are certain that the  patients are doing what we've asked, it makes no sense to ask them to do more.

## 2019-04-18 NOTE — Assessment & Plan Note (Signed)
Onset 2017 though ? 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 Trial Qvar 71mcg 2bid   Still not clear how much if any of her symptoms are really asthma at this point but reasonable to try one more short course prednisone and continue qvar which is the least likely to cause uacs flare

## 2019-04-24 NOTE — Progress Notes (Signed)
I agree with the assessment and plan as directed by NP on this visit . I was available for consultation.   Nataly Pacifico, MD  

## 2019-04-29 ENCOUNTER — Telehealth: Payer: Self-pay | Admitting: Adult Health

## 2019-04-29 NOTE — Telephone Encounter (Signed)
I called patient regarding converting her 8/10 appointment to a mychart video visit. LVM requesting she call back to schedule this or reschedule for an in office visit, due to NP schedule changes.

## 2019-05-05 ENCOUNTER — Telehealth: Payer: BLUE CROSS/BLUE SHIELD | Admitting: Adult Health

## 2019-05-05 ENCOUNTER — Telehealth: Payer: Self-pay | Admitting: Internal Medicine

## 2019-05-05 NOTE — Progress Notes (Deleted)
PATIENT: [name] DOB: 05/27/1986  REASON FOR VISIT: follow up HISTORY FROM: patient  HISTORY OF PRESENT ILLNESS: Today 05/05/19: [name] is a 33 year old female who is being seen today for narcolepsy follow-up through telemedicine via MyChart.  After prior visit, solriamfetol was initiated due to ongoing difficulties with narcolepsy management despite use of Adderall.  Adderall discontinued.  She has trialed use of armodafinil in the past but unable to tolerate due to increase in migraines.  Her narcolepsy symptoms have been ***.    Update 10/31/2018: [name] is a 33 year old female with history of narcolepsy and is being seen today for follow-up visit.  After prior visit, recommended to continue on Adderall XR 30 mg with initiating Adderall IR 10 mg.  She endorses continued excessive daytime fatigue despite Adderall increase in difficulty waking up in the morning.  Her normal day consists of waking up at 6:30 AM and typically will take her Adderall XR around 6:45 AM.  Her work hours are between 12:15 PM and 9:15 PM.  During the time between awakening and going to work, her day consists of bringing her children to school and doing any errands or household chores.  She states she tries to keep her self busy as if she sits down she will fall asleep.  She will take her Adderall IR around lunchtime and has attempted an additional Adderall IR around 5 PM due to excessive fatigue.  She works at [employer] call center.  She typically goes to bed between 10 PM and 10:15 PM after her dose of trazodone for insomnia.  She endorses receiving adequate night sleep and has attempted to not use trazodone at night but will be up all night due to insomnia.  She feels as though initial Adderall XR dose has not provided her any benefit and feels consistently tired.  She becomes tearful as her constant fatigue has increased her irritability and occasional word mixup when she is attempting to speak.  She is frustrated  by this as when she was previously on Adderall she experienced great benefit but feels as though since restarting, she has not noticed any benefit.  She has tried armodafinil in the past but unfortunately experienced frequent migraines. She has not attempted use of any other medications for her narcolepsy.  She does feel slightly more depressed but feels as though this is due to her not receiving adequate sleep.  She has attempted to take daytime naps especially on the weekends but feels as though she could just continue sleeping.  She does live at home with her husband and 2 small children.  Her husband endorses being a light sleeper as she does take trazodone and therefore her husband will get up in the middle the night if needed to care for the children.  She returns today for evaluation.  HISTORY 04/01/2018 MM: [name] is a 33 year old female with a history of narcolepsy.  She returns today for follow-up.  She is on Adderall.  She reports that when she initially started Adderall it was beneficial.  She states that now she is finding that she is falling asleep on her lunch break.  She works for [employer] call [employer].  She reports that she has tolerated Adderall well.  Denies any heart palpitations.  Blood pressure has remained stable.  She denies depression.  Reports that she sleeps "ok" at night.  She remains on trazodone.  She returns today for evaluation.  HISTORY Interval history from 13 August 2017.  I have the pleasure of seeing [name] today, 33 years old, right-handed Caucasian female married mother of 2.  [name] had some positive news over the last 6 months, her stress level and therefore her depression has much improved.  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  She could not tolerate armodafinil as generic or brand name Nuvigil because of frequent migraines.  Her narcolepsy is currently untreated as she weaned off  Adderall out of fear it would interfere with certain trazodone medication. Since she is starting a new job this month I would like for her to return to Adderall which according to her did not give her migraines.  She endorsed today a high-grade of fatigue at 57 points out of 63 possible points, and the Epworth Sleepiness Scale at 16 points out of 24 possible points.  She also reports no recent cataplectic attacks but this seems to be mainly due to medication that could control cataplexy as well as to her own tendency to restrain her emotion.   REVIEW OF SYSTEMS: Out of a complete 14 system review of symptoms, the patient complains only of the following symptoms, see HPI and all other reviewed systems are negative.  See HPI  ALLERGIES: Allergies  Allergen Reactions   Other Anaphylaxis and Other (See Comments)    Pt is allergic to balsamic vinaigrette.    Pt is allergic to balsamic vinaigrette.   balsalmic vingerette   Soy Allergy Anaphylaxis   Gluten Meal Diarrhea and Rash    Bloating    Sumatriptan Rash   Zolmitriptan Rash   Ciprofloxacin Other (See Comments)    Reaction:  Burning of pts arm   Nsaids Other (See Comments)    Pt states that she was told to avoid this class of medication.     Topiramate Other (See Comments)    Reaction:  Unknown    Adhesive [Tape] Itching and Rash   Triptans Rash    HOME MEDICATIONS: Outpatient Medications Prior to Visit  Medication Sig Dispense Refill   albuterol (VENTOLIN HFA) 108 (90 Base) MCG/ACT inhaler Inhale 2 puffs into the lungs every 4 (four) hours as needed.     beclomethasone (QVAR REDIHALER) 40 MCG/ACT inhaler Inhale 2 puffs into the lungs 2 (two) times daily. 1 Inhaler 3   EPINEPHrine (EPIPEN 2-PAK) 0.3 mg/0.3 mL IJ SOAJ injection Inject 0.3 mg into the muscle once as needed (for severe allergic reaction).     famotidine (PEPCID) 20 MG tablet One at bedtime     montelukast (SINGULAIR) 10 MG tablet Take by mouth.      norgestrel-ethinyl estradiol (LO/OVRAL,CRYSELLE) 0.3-30 MG-MCG tablet Take 1 tablet by mouth at bedtime.      predniSONE (DELTASONE) 10 MG tablet Take  4 each am x 2 days,   2 each am x 2 days,  1 each am x 2 days and stop 14 tablet 0   RABEprazole (ACIPHEX) 20 MG tablet Take 1 tablet (20 mg total) by mouth 2 (two) times daily before a meal. Take 2 puffs first thing in am and then another 2 puffs about 12 hours later. 60 tablet 2   Solriamfetol HCl (SUNOSI) 75 MG TABS Take 75 mg by mouth 2 (two) times daily. Take one tablet upon waking up and then another tablet 4 hrs later if needed 60 tablet 5   traZODone (DESYREL) 50 MG tablet TAKE 1 TABLET BY MOUTH  EVERYDAY AT BEDTIME 30 tablet 5   No facility-administered medications prior to visit.     PAST MEDICAL HISTORY: Past Medical History:  Diagnosis Date   Anemia    takes Ferrous Sulfate daily   Anxiety    takes Klonopin nightly   ASTHMA NOS W/ACUTE EXACERBATION 01/05/2010   exercise induced   Back pain    Coarse tremors    Degenerative disc disease    back   Gallstones    GERD (gastroesophageal reflux disease)    but doesn't take anything for it   History of kidney stones    History of migraine    last one a couple of days ago   History of UTI    frequent   IBS (irritable bowel syndrome)    doesn't take any meds   Medullary sponge kidney 2007   kidneys can be sick but she isnt sick   MIGRAINES, HX OF 06/24/2007   Narcolepsy    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   OCD (obsessive compulsive disorder)    takes Prozac daily   PYELONEPHRITIS 06/24/2007   Seizures (HCC)    takes Lamictal daily;last seizure was in 11/14/10   Urinary frequency     PAST SURGICAL HISTORY: Past Surgical History:  Procedure Laterality Date   BACK SURGERY     CHOLECYSTECTOMY N/A 12/02/2013   Procedure: LAPAROSCOPIC CHOLECYSTECTOMY WITH ATTEMPTED INTRAOPERATIVE CHOLANGIOGRAM;  Surgeon: Atilano InaEric M Wilson, MD;  [facility];  Service: General;   Laterality: N/A;   LUMBAR LAMINECTOMY/DECOMPRESSION MICRODISCECTOMY  07/24/2012   Procedure: LUMBAR LAMINECTOMY/DECOMPRESSION MICRODISCECTOMY;  Surgeon: Jacki Conesonald A Gioffre, MD;  Location: [facility];  Service: Orthopedics;  Laterality: Right;  L5-S1   WISDOM TOOTH EXTRACTION      FAMILY HISTORY: Family History  Problem Relation Age of Onset   Heart disease Mother    ADD / ADHD Sister    Lung cancer Maternal Grandmother        smoked   Anesthesia problems Neg Hx     SOCIAL HISTORY: Social History   Socioeconomic History   Marital status: Married    Spouse name: Not on file   Number of children: 2   Years of education: 14   Highest education level: Not on file  Occupational History   Occupation: not employed    Associate Professormployer: [employer]    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 when not pregnant   Drug use: No   Sexual activity: Yes    Partners: Male    Comment: last sex  Aug 22 2014  Lifestyle   Physical activity    Days per week: Not on file    Minutes per session: Not on file   Stress: Not on file  Relationships   Social connections    Talks on phone: Not on file    Gets together: Not on file    Attends religious service: Not on file    Active member of club or organization: Not on file    Attends meetings of clubs or  organizations: Not on file    Relationship status: Not on file   Intimate partner violence    Fear of current or ex partner: Not on file    Emotionally abused: Not on file    Physically abused: Not on file    Forced sexual activity: Not on file  Other  Topics Concern   Not on file  Social History Narrative   HSG, finished 2 years of college. single mom with 2 sons blake - Aug '09, Elijah Dec '12. 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 Still lives with father of her sons with wedding plans on hold ( Jan '13)      PHYSICAL EXAM  There were no vitals filed for this visit. There is no height or weight on file to calculate BMI.  Generalized: Well developed, pleasant young Caucasian female, in no acute distress   Neurological examination  Mentation: Alert oriented to time, place, history taking. Follows all commands speech and language fluent Cranial nerve II-XII: Pupils were equal round reactive to light. Extraocular movements were full, visual field were full on confrontational test. Facial sensation and strength were normal. Uvula tongue midline. Head turning and shoulder shrug  were normal and symmetric. Motor: The motor testing reveals 5 over 5 strength of all 4 extremities. Good symmetric motor tone is noted throughout.  Sensory: Sensory testing is intact to soft touch on all 4 extremities. No evidence of extinction is noted.  Coordination: Cerebellar testing reveals good finger-nose-finger and heel-to-shin bilaterally.  Gait and station: Gait is normal.  Reflexes: Deep tendon reflexes are symmetric and normal bilaterally.   DIAGNOSTIC DATA (LABS, IMAGING, TESTING) - I reviewed patient records, labs, notes, testing and imaging myself where available.  Lab Results  Component Value Date   WBC 5.5 11/06/2018   HGB 13.3 11/06/2018   HCT 40.5 11/06/2018   MCV 88 11/06/2018   PLT 331 11/06/2018      Component Value Date/Time   NA 140 11/06/2018 1120   K 4.2 11/06/2018 1120   CL 104 11/06/2018 1120   CO2 22 11/06/2018 1120   GLUCOSE 122 (H) 11/06/2018 1120   GLUCOSE 105 (H) 01/26/2018 1136   BUN 8 11/06/2018 1120   CREATININE 0.89 11/06/2018 1120   CREATININE 0.77 12/23/2013 0837   CALCIUM  9.3 11/06/2018 1120   PROT 7.3 01/26/2018 1136   PROT 7.2 10/30/2013 1615   ALBUMIN 3.7 01/26/2018 1136   ALBUMIN 4.3 10/30/2013 1615   AST 34 01/26/2018 1136   ALT 56 (H) 01/26/2018 1136   ALKPHOS 74 01/26/2018 1136   BILITOT 0.4 01/26/2018 1136   GFRNONAA 86 11/06/2018 1120   GFRAA 99 11/06/2018 1120    Lab Results  Component Value Date   VITAMINB12 275 10/30/2013   Lab Results  Component Value Date   TSH 2.090 11/06/2018      ASSESSMENT AND PLAN 33 y.o. year old female  has a past medical history of Anemia, Anxiety, ASTHMA NOS W/ACUTE EXACERBATION (01/05/2010), Back pain, Coarse tremors, Degenerative disc disease, Gallstones, GERD (gastroesophageal reflux disease), History of kidney stones, History of migraine, History of UTI, IBS (irritable bowel syndrome), Medullary sponge kidney (2007), MIGRAINES, HX OF (06/24/2007), Narcolepsy, Narcolepsy (05/18/2014), OCD (obsessive compulsive disorder), PYELONEPHRITIS (06/24/2007), Seizures (HCC), and Urinary frequency. here with:  1.  Narcolepsy  She continues to express no benefit with use of Adderall XR 30 mg and Adderall IR 10 mg.  After speaking with Dr. Vickey Hugerohmeier, it  was recommended for her to trial taking Adderall IR 10mg  upon awakening and take a midmorning nap prior to going into work and at that time take Adderall XR 30mg .  Recommended to trial this for approximately 1 week and if she continues to experience no benefit, consider initiating Xyrem.  Discussion with patient and husband regarding safety of increasing Adderall dosage and difficulty treating insomnia symptoms when she has 15-hour days.  As she does sit at her desk for work, recommended to stand frequently or to take frequent breaks as able to walk around office.  She will continue to monitor for any worsening depression.  She will call in the next week to update [place] on her fatigue symptoms.  She will follow-up in 6 months time or earlier if needed  I spent 15 minutes with the  patient. 50% of this time was spent discussing her medication regimen and when the best time is to take Adderall, educated on safety with Adderall medication, ways to help prevent ongoing daytime fatigue and possible initiation of Xyrem with education material provided for her to review.   Venancio Poisson, AGNP-BC  [facility] [address] [place], [place] [address]  Phone [phone] Fax [phone] Note: This document was prepared with digital dictation and possible smart phrase technology. Any transcriptional errors that result from this process are unintentional.

## 2019-05-07 ENCOUNTER — Encounter: Payer: Self-pay | Admitting: Adult Health

## 2019-05-07 NOTE — Telephone Encounter (Signed)
Rec'd paperwork - fwd to [facility] via interoffice mail -pr

## 2019-05-07 NOTE — Telephone Encounter (Signed)
[name], please advise if paperwork has been taken care of by MW.

## 2019-05-07 NOTE — Telephone Encounter (Signed)
[name], I think I returned this to you yesterday, can you confirm?

## 2019-05-08 NOTE — Telephone Encounter (Signed)
Can try gaviscon liquid prn instead of tums and try pepcid 20 mg one hour before bed but needs GI eval next if the aciphex/diet aren't helping

## 2019-05-08 NOTE — Telephone Encounter (Signed)
Dr. Wert please advise.  Thanks! 

## 2019-05-16 ENCOUNTER — Ambulatory Visit (INDEPENDENT_AMBULATORY_CARE_PROVIDER_SITE_OTHER): Payer: BC Managed Care – PPO | Admitting: Internal Medicine

## 2019-05-16 ENCOUNTER — Encounter: Payer: Self-pay | Admitting: Internal Medicine

## 2019-05-16 ENCOUNTER — Other Ambulatory Visit: Payer: Self-pay

## 2019-05-16 DIAGNOSIS — R053 Chronic cough: Secondary | ICD-10-CM

## 2019-05-16 DIAGNOSIS — K219 Gastro-esophageal reflux disease without esophagitis: Secondary | ICD-10-CM | POA: Insufficient documentation

## 2019-05-16 DIAGNOSIS — J4520 Mild intermittent asthma, uncomplicated: Secondary | ICD-10-CM | POA: Diagnosis not present

## 2019-05-16 DIAGNOSIS — R05 Cough: Secondary | ICD-10-CM

## 2019-05-16 DIAGNOSIS — Z6841 Body Mass Index (BMI) 40.0 and over, adult: Secondary | ICD-10-CM

## 2019-05-16 MED ORDER — ACETAMINOPHEN-CODEINE #3 300-30 MG PO TABS: 1.0000 | ORAL_TABLET | ORAL | 0 refills | Status: DC | PRN

## 2019-05-16 MED ORDER — PREDNISONE 10 MG PO TABS: ORAL_TABLET | ORAL | 0 refills | Status: DC

## 2019-05-16 NOTE — Progress Notes (Signed)
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 and did better  but never back to nl = could walk a mile s stopping slow pace but  struggled to do hills says was told this was due to  "asthma and allergy" and never restored to nl baseline with "outdoor allergies" despite dulera /singulair then acutely worse in march 18th 2020 suddenly short of breath/bad coughing fits  with minimal activity,aching all over  Fever x one week > [facility] PCP with initial  covid testing ng > self isolated   > repeat swab neg >  Fever, aches gone by return to PCP  then started pred/zpak > cxr 01/24/2019 neg so CTa > abnormal so referred to pulmonary clinic 02/07/2019 by Huey Romans PA      History of Present Illness  02/07/2019  Pulmonary/ 1st office eval/Se[facility] Machia  New cough/ worse  Since March 2020 Chief Complaint  Patient presents with  . Pulmonary Consult    Referred by [name], PA for eval of cough and SOB since March 2020.  Dyspnea:  MMRC3 = can't walk 100 yards even at a slow pace at a flat grade s stopping due to sob  / worse with voice use  Cough: worse with exertion/talking  Sleep: r sigde  down, flat bed no resp symptoms SABA use:  None x one week - no better  Cp s pleuritic / ex features always center of chest/ anterior, waxes and wanes x years rec Finish your prednisone For worse breathing  restart dulera but take the 100 up to 2 pffs every 12 hours if needed  Take delsym two tsp every 12 hours and supplement if needed with  Tylenol #3   up to 1-2 every 4 hours to suppress the urge to cough.  Once you have eliminated the cough for 3 straight days try reducing the Tylenol #3 first,  then the delsym as tolerated.     Protonix (pantoprazole) Take 30-60 min before first meal of the day and Pepcid 20 mg one after supper automatically x at least two weeks  GERD diet    04/17/2019  f/u ov/Keidy Thurgood re: cough daily / intol to protonix = bloating/ did not bring meds as req  Chief Complaint  Patient presents with  . Acute Visit    She has had increased reflux "burning in her throat".  She does not do well out in the heat with her breathing. She uses her albuterol inhaler at least 2 puffs per day.   Dyspnea:  MMRC1 = can walk nl pace, flat grade, can't hurry or go uphills or steps s sob   Cough: none, sensation of globus/ grinding airway "to keep from coughing"  Sleeping: on R side flat  SABA use: variable  but mostly when overdoes it  02: none  rec  Aciphex 20 mg Take 30- 60 min before your first and last meals of the day and pepcid 20 mg at bedtime  GERD diet  Prednisone 10 mg take  4 each am x 2 days,   2 each am x 2 days,  1 each am x 2 days and stop  For cough > delsym 2 tsp every 12 hours as needed  Please schedule a follow up office visit in 4 weeks, sooner if needed    05/16/2019  f/u ov/Dinara Lupu re: cough x 2017 assoc with gag / hb and incont on BCPs Chief Complaint  Patient presents with  . Follow-up    Patient reports that she still has a cough and sob. She states that she has to take shorter breaths because it she does it makes her cough. She reports that the Aciphex made her cough worse and caused irritation to her esophogus.  Dyspnea:  Good activity tol  if not coughing Cough: worse early am and before supper and still taking aciphex with sensation of overt HB  Sleeping: can't  do bed blocks, does wedge and slides down SABA use: no change  02: none    No obvious day to day or daytime variability or assoc excess/ purulent sputum or mucus plugs or hemoptysis or cp or chest tightness, subjective wheeze or overt sinus   symptoms.    . Also denies any obvious fluctuation of symptoms with weather or environmental changes or other aggravating or alleviating factors except as outlined above   No unusual exposure hx or h/o childhood pna  or knowledge of premature birth.  Current Allergies, Complete Past Medical History, Past Surgical History, Family History, and Social History were reviewed in [employer] record.  ROS  The following are not active complaints unless bolded Hoarseness, sore throat, dysphagia, dental problems, itching, sneezing,  nasal congestion or discharge of excess mucus or purulent secretions, ear ache,   fever, chills, sweats, unintended wt loss or wt gain, classically pleuritic or exertional cp,  orthopnea pnd or arm/hand swelling  or leg swelling, presyncope, palpitations, abdominal pain, anorexia, nausea, vomiting, diarrhea  or change in bowel habits or change in bladder habits, change in stools or change in urine, dysuria, hematuria,  rash, arthralgias, visual complaints, headache, numbness, weakness or ataxia or problems with walking or coordination,  change in mood or  memory.        Current Meds  Medication Sig  . albuterol (VENTOLIN HFA) 108 (90 Base) MCG/ACT inhaler Inhale 2 puffs into the lungs every 4 (four) hours as needed.  . beclomethasone (QVAR REDIHALER) 40 MCG/ACT inhaler Inhale 2 puffs into the lungs 2 (two) times daily.  Marland Kitchen EPINEPHrine (EPIPEN 2-PAK) 0.3 mg/0.3 mL IJ SOAJ injection Inject 0.3 mg into the muscle once as needed (for severe allergic reaction).  . famotidine (PEPCID) 20 MG tablet One at bedtime  . montelukast (SINGULAIR) 10 MG tablet Take by mouth.  . norgestrel-ethinyl estradiol (LO/OVRAL,CRYSELLE)  0.3-30 MG-MCG tablet Take 1 tablet by mouth at bedtime.   . Solriamfetol HCl (SUNOSI) 75 MG TABS Take 75 mg by mouth 2 (two) times daily. Take one tablet upon waking up and then another tablet 4 hrs later if needed  . traZODone (DESYREL) 50 MG tablet TAKE 1 TABLET BY MOUTH EVERYDAY AT BEDTIME                    Past Medical History:  Diagnosis Date  . Anemia    takes Ferrous Sulfate daily  . Anxiety    takes Klonopin nightly  . ASTHMA NOS W/ACUTE EXACERBATION 01/05/2010   exercise induced  . Back pain   . Coarse tremors   . Degenerative disc disease    back  . Gallstones   . GERD (gastroesophageal reflux disease)    but doesn't take anything for it  . History of kidney stones   . History of migraine    last one a couple of days ago  . History of UTI    frequent  . IBS (irritable bowel syndrome)    doesn't take any meds  . Medullary sponge kidney 2007   kidneys can be sick but she isnt sick  . MIGRAINES, HX OF 06/24/2007  . Narcolepsy   . 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 OCD (obsessive compulsive disorder)    takes Prozac daily  . PYELONEPHRITIS 06/24/2007  . Seizures (HCC)    takes Lamictal daily;last seizure was in 11/14/10  . Urinary frequency        Objective:     amb wf nad no longer throat clearing but harsh cough early on insp   05/16/2019       224   04/17/19 220 lb 12.8 oz (100.2 kg)  02/07/19 210 lb (95.3 kg)  10/31/18 194 lb 3.2 oz (88.1 kg)    Vital signs reviewed - Note on arrival 02 sats  99% on RA   HEENT : pt wearing mask not removed for exam due to covid -19 concerns.  NECK :  without JVD/Nodes/TM/ nl carotid upstrokes bilaterally   LUNGS: no acc muscle use,  Nl contour chest which is clear to A and P bilaterally with  Cough immediately  on ins  maneuvers   CV:  RRR  no s3 or murmur or increase in P2, and no edema   ABD:  Obese/ soft and nontender with nl inspiratory excursion in the supine position. No bruits or organomegaly appreciated,  bowel sounds nl  MS:  Nl gait/ ext warm without deformities, calf tenderness, cyanosis or clubbing No obvious joint restrictions   SKIN: warm and dry without lesions    NEURO:  alert, approp, nl sensorium with  no motor or cerebellar deficits apparent.              Assessment

## 2019-05-16 NOTE — Patient Instructions (Addendum)
Stop qvar Late add:  Prednisone 10 mg take  4 each am x 2 days,   2 each am x 2 days,  1 each am x 2 days and stop   For drainage / throat tickle try take CHLORPHENIRAMINE  4 mg  (Chlortab 4mg   at [employer] should be easiest to find in the green box)  take one every 4 hours as needed - available over the counter- may cause drowsiness so start with just a bedtime dose or two and see how you tolerate it before trying in daytime    Only use your albuterol as a rescue medication to be used if you can't catch your breath by resting or doing a relaxed purse lip breathing pattern.  - The less you use it, the better it will work when you need it. - Ok to use up to 2 puffs  every 4 hours if you must but call for immediate appointment if use goes up over your usual need - Don't leave home without it !!  (think of it like the spare tire for your car)   Take delsym two tsp every 12 hours and supplement if needed with  Tylenol #3   up to 1-2 every 4 hours to suppress the urge to cough. Swallowing water and/or using ice chips/non mint and menthol containing candies (such as lifesavers or sugarless jolly ranchers) are also effective.  You should rest your voice and avoid activities that you know make you cough.  Once you have eliminated the cough for 3 straight days try reducing the Tylenol #3 first,  then the delsym as tolerated.    Continue aciphex, pepcid and singulair   Strongly consider vasectomy for your husband   We will arrange for a GI eval asap    No work x 2 weeks   Please schedule a follow up office visit in 2 weeks, sooner if needed  with all medications /inhalers/ solutions in hand so we can verify exactly what you are taking. This includes all medications from all doctors and over the counters

## 2019-05-17 ENCOUNTER — Encounter: Payer: Self-pay | Admitting: Internal Medicine

## 2019-05-17 NOTE — Assessment & Plan Note (Signed)
Body mass index is 43.86 kg/m.  -  Trending up Lab Results  Component Value Date   TSH 2.090 11/06/2018     Contributing to gerd risk/ doe/reviewed the need and the process to achieve and maintain neg calorie balance > defer f/u primary care including intermittently monitoring thyroid status

## 2019-05-17 NOTE — Assessment & Plan Note (Signed)
Onset 2017 though ? 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 Trial Qvar 71mcg 2bid > d/c 05/16/2019 as no better and may be aggravating UACS   Cough early on insp is not c/w asthma and points strongly to uacs/ vcd

## 2019-05-17 NOTE — Assessment & Plan Note (Addendum)
Onset March 2020 with viral illness and covid 19 neg pcr x 2  - max rx for cyclical coughing/ stop ics 02/07/2019  -  02/21/2019 still coughing but not doing cyclical cough regimen and ppi causing bloating so changed to h2 bid and repeat the protocol x 3 days then ov > 04/17/2019 still throat clearing - 04/17/2019 changed to aciphex bid    - 8/21/2020add 1st gen H1 blockers per guidelines     Of the three most common causes of  Sub-acute / recurrent or chronic cough, only one (GERD)  can actually contribute to/ trigger  the other two (asthma and post nasal drip syndrome)  and perpetuate the cylce of cough.  While not intuitively obvious, many patients with chronic low grade reflux do not cough until there is a primary insult that disturbs the protective epithelial barrier and exposes sensitive nerve endings.   This is typically viral but can due to PNDS and  either may apply here.      >>>The point is that once this occurs, it is difficult to eliminate the cycle  using anything but a maximally effective acid suppression regimen at least in the short run, accompanied by an appropriate diet to address non acid GERD and control / eliminate the cough itself for at least 3 days with tyl #3 and 1st gen H1 blockers per guidelines andAlso added 6 days of Prednisone in case of component of Th-2 driven upper or lower airways inflammation which is still possibly a component of her problem     >>> refer to GI for refractory GERD and stop all ICS at this point as well as they may be aggravating UACS

## 2019-05-17 NOTE — Assessment & Plan Note (Signed)
Referred to [facility] 05/16/2019  - also rec change from progesterone containing bcps  Advised would be much better to have husband vasectomized than continue bcps into her 62's given wt issues and risk of worse gerd/ dvt/pe/cva.   I had an extended discussion with the patient reviewing all relevant studies completed to date and  lasting 15 to 20 minutes of a 25 minute visit    Each maintenance medication was reviewed in detail including most importantly the difference between maintenance and prns and under what circumstances the prns are to be triggered using an action plan format that is not reflected in the computer generated alphabetically organized AVS.     Please see AVS for specific instructions unique to this visit that I personally wrote and verbalized to the the pt in detail and then reviewed with pt  by my nurse highlighting any  changes in therapy recommended at today's visit to their plan of care.

## 2019-05-19 ENCOUNTER — Telehealth: Payer: Self-pay | Admitting: Internal Medicine

## 2019-05-19 LAB — RESPIRATORY ALLERGY PROFILE REGION II ~~LOC~~
Allergen, A. alternata, m6: <0.1 kU/L
Allergen, Cedar tree, t12: 0.26 kU/L — ABNORMAL HIGH
Allergen, Comm Silver Birch, t9: <0.1 kU/L
Allergen, Cottonwood, t14: <0.1 kU/L
Allergen, D pternoyssinus,d7: 1.31 kU/L — ABNORMAL HIGH
Allergen, Mouse Urine Protein, e78: <0.1 kU/L
Allergen, Mulberry, t76: <0.1 kU/L
Allergen, Oak,t7: <0.1 kU/L
Allergen, P. notatum, m1: <0.1 kU/L
Aspergillus fumigatus, m3: <0.1 kU/L
Bermuda Grass: <0.1 kU/L
Box Elder IgE: <0.1 kU/L
CLADOSPORIUM HERBARUM (M2) IGE: <0.1 kU/L
COMMON RAGWEED (SHORT) (W1) IGE: 0.14 kU/L — ABNORMAL HIGH
Cat Dander: <0.1 kU/L
Class: 0
Class: 0
Class: 0
Class: 0
Class: 0
Class: 0
Class: 0
Class: 0
Class: 0
Class: 0
Class: 0
Class: 0
Class: 0
Class: 0
Class: 0
Class: 0
Class: 0
Class: 0
Class: 0
Class: 0
Class: 0
Class: 2
Class: 2
Class: 2
Cockroach: <0.1 kU/L
D. farinae: 1.12 kU/L — ABNORMAL HIGH
Dog Dander: <0.1 kU/L
Elm IgE: <0.1 kU/L
IgE (Immunoglobulin E), Serum: 12 kU/L (ref <=114)
Johnson Grass: 0.11 kU/L — ABNORMAL HIGH
Pecan/Hickory Tree IgE: <0.1 kU/L
Rough Pigweed  IgE: <0.1 kU/L
Sheep Sorrel IgE: <0.1 kU/L
Timothy Grass: 1.26 kU/L — ABNORMAL HIGH

## 2019-05-19 LAB — CBC WITH DIFFERENTIAL/PLATELET
Absolute Monocytes: 360 cells/uL (ref 200–950)
Basophils Absolute: 31 cells/uL (ref 0–200)
Basophils Relative: 0.5 %
Eosinophils Absolute: 110 cells/uL (ref 15–500)
Eosinophils Relative: 1.8 %
HCT: 37.9 % (ref 35.0–45.0)
Hemoglobin: 12.8 g/dL (ref 11.7–15.5)
Lymphs Abs: 1751 cells/uL (ref 850–3900)
MCH: 29 pg (ref 27.0–33.0)
MCHC: 33.8 g/dL (ref 32.0–36.0)
MCV: 85.7 fL (ref 80.0–100.0)
MPV: 10.9 fL (ref 7.5–12.5)
Monocytes Relative: 5.9 %
Neutro Abs: 3849 cells/uL (ref 1500–7800)
Neutrophils Relative %: 63.1 %
Platelets: 349 10*3/uL (ref 140–400)
RBC: 4.42 10*6/uL (ref 3.80–5.10)
RDW: 12 % (ref 11.0–15.0)
Total Lymphocyte: 28.7 %
WBC: 6.1 10*3/uL (ref 3.8–10.8)

## 2019-05-19 LAB — INTERPRETATION:

## 2019-05-19 NOTE — Telephone Encounter (Signed)
Message forwarded to Dr. Melvyn Novas as an FYI:  Dr. Melvyn Novas this patient was scheduled with GI on 05/30/19 at 3:30 and has an appointment to see you on 06/27/19 at 2:00.

## 2019-05-20 NOTE — Telephone Encounter (Signed)
ATC pt, no answer. Left message for pt to call back.  

## 2019-05-20 NOTE — Telephone Encounter (Signed)
I don't think anyone else can see her sooner but she's certainly free to have her PCP see if she can refer to whomever PCP uses for htis

## 2019-05-21 NOTE — Telephone Encounter (Signed)
Called and spoke to patient.  Relayed message from Dr. Melvyn Novas.  Patient stated she is fine with seeing GI on 05/30/2019 and then seeing Dr. Melvyn Novas in October. She just wanted to make him aware. Nothing further needed at this time.

## 2019-05-30 ENCOUNTER — Ambulatory Visit: Payer: BC Managed Care – PPO | Admitting: Nurse Practitioner

## 2019-05-30 ENCOUNTER — Ambulatory Visit: Payer: BC Managed Care – PPO | Admitting: Internal Medicine

## 2019-06-06 ENCOUNTER — Ambulatory Visit: Payer: BC Managed Care – PPO | Admitting: Physician Assistant

## 2019-06-12 ENCOUNTER — Encounter: Payer: Self-pay | Admitting: Physician Assistant

## 2019-06-12 ENCOUNTER — Ambulatory Visit (INDEPENDENT_AMBULATORY_CARE_PROVIDER_SITE_OTHER): Payer: BC Managed Care – PPO | Admitting: Physician Assistant

## 2019-06-12 VITALS — BP 130/70 | HR 90 | Temp 98.4°F | Ht 60.0 in | Wt 224.0 lb

## 2019-06-12 DIAGNOSIS — K219 Gastro-esophageal reflux disease without esophagitis: Secondary | ICD-10-CM | POA: Diagnosis not present

## 2019-06-12 DIAGNOSIS — R197 Diarrhea, unspecified: Secondary | ICD-10-CM | POA: Diagnosis not present

## 2019-06-12 DIAGNOSIS — R1310 Dysphagia, unspecified: Secondary | ICD-10-CM | POA: Diagnosis not present

## 2019-06-12 MED ORDER — OMEPRAZOLE 40 MG PO CPDR: 40.0000 mg | DELAYED_RELEASE_CAPSULE | Freq: Every day | ORAL | 3 refills | Status: DC

## 2019-06-12 MED ORDER — CHOLESTYRAMINE 4 G PO PACK: 4.0000 g | PACK | Freq: Once | ORAL | 1 refills | Status: DC

## 2019-06-12 NOTE — Patient Instructions (Addendum)
You have been scheduled for an endoscopy. Please follow written instructions given to you at your visit today. If you use inhalers (even only as needed), please bring them with you on the day of your procedure.  We have sent the following medications to your pharmacy for you to pick up at your convenience: Omeprazole and cholestyramine  You will need to contact [employer] at ph: [place], [place] [address] for any disability forms that need to be fill out.   Thank you for choosing me and [facility].  Dennison Bulla

## 2019-06-12 NOTE — Progress Notes (Signed)
Chief Complaint: GERD, dysphagia  HPI:    [name] is a 33 year old Caucasian female with a past medical history as listed below, known to Dr. Christella Barker, who presents clinic today with a complaint of GERD and dysphasia.      08/22/2017 seen by Dr. Christella Barker for reflux and loose stools.  At that time it was discussed that some of her loose stools may be related to her remote gallbladder removal.  She was tried on cholestyramine powder.  She was checked for celiac disease.  Also given a trial of twice daily H2 blocker and EGD.  (It does not appear she ever had EGD)    01/26/2018 CT renal stone study was normal.    05/16/2019 patient seen by pulmonology and discussed dysphagia.  She is continued on AcipHex Pepcid and Singulair.  They arranged GI eval then.    Today, the patient presents to clinic and explains that she does not recall being seen for the symptoms back in 2018.  She also does not recall Cholestyramine.  Tells me that she has had a worsening of the symptoms since March.  Describes that she got "deathly ill, like could not walk from my bed to the bathroom", and since then could not exercise and gained weight and has become increasingly short of breath for which she has been seeing pulmonology.  Along with this tells me that when she tries to breath in she coughs and has had an increase in her reflux symptoms on a daily basis tasting acid and experiencing a burning down the back of her throat.  Currently taking Famotidine 20 mg daily before bedtime "when I remember".  Associated symptoms include some dysphagia to solids with every meal.  Cleared by a big bolus of water or coughing.  She thinks she is supposed to take some sort of medicine 30 minutes before breakfast and dinner but cannot remember these medicines at all.  Admits to having pill bottles lined up on her windowsill which she forgets to take every day.    Also describes diarrhea multiple times a day, it has been like this for years.    Social  history is positive for currently being out on short-term disability.  Pulmonologist signed this paperwork for her given that she works at a call center and when she coughs she becomes increasingly short of breath.    Denies fever, chills, weight loss, anorexia, nausea, vomiting or symptoms that awaken her from sleep.  Past Medical History:  Diagnosis Date  . Anemia    takes Ferrous Sulfate daily  . Anxiety    takes Klonopin nightly  . ASTHMA NOS W/ACUTE EXACERBATION 01/05/2010   exercise induced  . Back pain   . Coarse tremors   . Degenerative disc disease    back  . Gallstones   . GERD (gastroesophageal reflux disease)    but doesn't take anything for it  . History of kidney stones   . History of migraine    last one a couple of days ago  . History of UTI    frequent  . IBS (irritable bowel syndrome)    doesn't take any meds  . Medullary sponge kidney 2007   kidneys can be sick but she isnt sick  . MIGRAINES, HX OF 06/24/2007  . Narcolepsy   . 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 OCD (obsessive compulsive disorder)    takes Prozac daily  . PYELONEPHRITIS 06/24/2007  . Seizures (HCC)    takes Lamictal daily;last seizure was  in 11/14/10  . Urinary frequency     Past Surgical History:  Procedure Laterality Date  . BACK SURGERY    . CHOLECYSTECTOMY N/A 12/02/2013   Procedure: LAPAROSCOPIC CHOLECYSTECTOMY WITH ATTEMPTED INTRAOPERATIVE CHOLANGIOGRAM;  Surgeon: Atilano Ina, MD;  Location: [facility];  Service: General;  Laterality: N/A;  . LUMBAR LAMINECTOMY/DECOMPRESSION MICRODISCECTOMY  07/24/2012   Procedure: LUMBAR LAMINECTOMY/DECOMPRESSION MICRODISCECTOMY;  Surgeon: Jacki Cones, MD;  Location: [facility] ORS;  Service: Orthopedics;  Laterality: Right;  L5-S1  . WISDOM TOOTH EXTRACTION      Current Outpatient Medications  Medication Sig Dispense Refill  . acetaminophen-codeine (TYLENOL #3) 300-30 MG tablet Take 1-2 tablets by mouth every 4 (four) hours as needed (cough). 40 tablet 0  .  albuterol (VENTOLIN HFA) 108 (90 Base) MCG/ACT inhaler Inhale 2 puffs into the lungs every 4 (four) hours as needed.    Marland Kitchen EPINEPHrine (EPIPEN 2-PAK) 0.3 mg/0.3 mL IJ SOAJ injection Inject 0.3 mg into the muscle once as needed (for severe allergic reaction).    . famotidine (PEPCID) 20 MG tablet One at bedtime    . montelukast (SINGULAIR) 10 MG tablet Take by mouth.    . norgestrel-ethinyl estradiol (LO/OVRAL,CRYSELLE) 0.3-30 MG-MCG tablet Take 1 tablet by mouth at bedtime.     . predniSONE (DELTASONE) 10 MG tablet Take  4 each am x 2 days,   2 each am x 2 days,  1 each am x 2 days and stop 14 tablet 0  . RABEprazole (ACIPHEX) 20 MG tablet Take 1 tablet (20 mg total) by mouth 2 (two) times daily before a meal. Take 2 puffs first thing in am and then another 2 puffs about 12 hours later. (Patient not taking: Reported on 05/16/2019) 60 tablet 2  . Solriamfetol HCl (SUNOSI) 75 MG TABS Take 75 mg by mouth 2 (two) times daily. Take one tablet upon waking up and then another tablet 4 hrs later if needed 60 tablet 5  . traZODone (DESYREL) 50 MG tablet TAKE 1 TABLET BY MOUTH EVERYDAY AT BEDTIME 30 tablet 5   No current facility-administered medications for this visit.     Allergies as of 06/12/2019 - Review Complete 05/17/2019  Allergen Reaction Noted  . Other Anaphylaxis and Other (See Comments) 09/01/2011  . Soy allergy Anaphylaxis 08/04/2014  . Gluten meal Diarrhea and Rash 11/23/2017  . Sumatriptan Rash 06/05/2015  . Zolmitriptan Rash   . Ciprofloxacin Other (See Comments)   . Nsaids Other (See Comments) 11/14/2010  . Topiramate Other (See Comments)   . Adhesive [tape] Itching and Rash 11/19/2013  . Triptans Rash 07/23/2012    Family History  Problem Relation Age of Onset  . Heart disease Mother   . ADD / ADHD Sister   . Lung cancer Maternal Grandmother        smoked  . Anesthesia problems Neg Hx     Social History   Socioeconomic History  . Marital status: Married    Spouse name:  Not on file  . Number of children: 2  . Years of education: 37  . Highest education level: Not on file  Occupational History  . 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 Financial resource strain: Not on file  . Food insecurity    Worry: Not on file    Inability: Not on file  . Transportation needs    Medical: Not on file    Non-medical:  Not on file  Tobacco Use  . Smoking status: Never Smoker  . Smokeless tobacco: Never Used  Substance and Sexual Activity  . Alcohol use: No    Alcohol/week: 0.0 standard drinks    Comment: occas. when not pregnant  . Drug use: No  . Sexual activity: Yes    Partners: Male    Comment: last sex  Aug 22 2014  Lifestyle  . Physical activity    Days per week: Not on file    Minutes per session: Not on file  . Stress: Not on file  Relationships  . Social Musicianconnections    Talks on phone: Not on file    Gets together: Not on file    Attends religious service: Not on file    Active member of club or organization: Not on file    Attends meetings of clubs or organizations: Not on file    Relationship status: Not on file  . Intimate partner violence    Fear of current or ex partner: Not on file    Emotionally abused: Not on file    Physically abused: Not on file    Forced sexual activity: Not on file  Other Topics Concern  . Not on file  Social History Narrative   HSG, finished 2 years of college. single mom with 2 sons blake - Aug '09, Elijah Dec '12. 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 Still lives with father of her sons with wedding plans on hold ( Jan '13)    Review of Systems:    Constitutional: No weight loss, fever or chills Cardiovascular: No chest pain Respiratory: +SOB Gastrointestinal: See HPI and otherwise negative   Physical Exam:  Vital signs: BP 130/70   Pulse 90   Temp 98.4 F (36.9 C)   Ht 5' (1.524 m)   Wt 224  lb (101.6 kg)   BMI 43.75 kg/m   Constitutional:   Pleasant Overweight Caucasian female appears to be in NAD, Well developed, Well nourished, alert and cooperative Respiratory: Respirations even and unlabored. Lungs clear to auscultation bilaterally.   No wheezes, crackles, or rhonchi.  Cardiovascular: Normal S1, S2. No MRG. Regular rate and rhythm. No peripheral edema, cyanosis or pallor.  Gastrointestinal:  Soft, nondistended,mild epigastric ttp, No rebound or guarding. Normal bowel sounds. No appreciable masses or hepatomegaly. Rectal:  Not performed.  Psychiatric: Demonstrates good judgement and reason without abnormal affect or behaviors.  MOST RECENT LABS AND IMAGING: CBC    Component Value Date/Time   WBC 6.1 05/16/2019 1558   RBC 4.42 05/16/2019 1558   HGB 12.8 05/16/2019 1558   HGB 13.3 11/06/2018 1120   HCT 37.9 05/16/2019 1558   HCT 40.5 11/06/2018 1120   PLT 349 05/16/2019 1558   PLT 331 11/06/2018 1120   MCV 85.7 05/16/2019 1558   MCV 88 11/06/2018 1120   MCH 29.0 05/16/2019 1558   MCHC 33.8 05/16/2019 1558   RDW 12.0 05/16/2019 1558   RDW 12.2 11/06/2018 1120   LYMPHSABS 1,751 05/16/2019 1558   LYMPHSABS 2.3 10/30/2013 1615   MONOABS 0.3 08/08/2017 0839   EOSABS 110 05/16/2019 1558   EOSABS 0.1 10/30/2013 1615   BASOSABS 31 05/16/2019 1558   BASOSABS 0.1 10/30/2013 1615    CMP     Component Value Date/Time   NA 140 11/06/2018 1120   K 4.2 11/06/2018 1120   CL 104 11/06/2018 1120   CO2 22 11/06/2018 1120   GLUCOSE  122 (H) 11/06/2018 1120   GLUCOSE 105 (H) 01/26/2018 1136   BUN 8 11/06/2018 1120   CREATININE 0.89 11/06/2018 1120   CREATININE 0.77 12/23/2013 0837   CALCIUM 9.3 11/06/2018 1120   PROT 7.3 01/26/2018 1136   PROT 7.2 10/30/2013 1615   ALBUMIN 3.7 01/26/2018 1136   ALBUMIN 4.3 10/30/2013 1615   AST 34 01/26/2018 1136   ALT 56 (H) 01/26/2018 1136   ALKPHOS 74 01/26/2018 1136   BILITOT 0.4 01/26/2018 1136   GFRNONAA 86 11/06/2018 1120    GFRAA 99 11/06/2018 1120    Assessment: 1.  GERD: Uncontrolled on Famotidine nightly  2.  Dysphagia: To solids, worse over the past 6 months; consider stricture versus dysmotility versus other 3.  Diarrhea: Chronic for the patient ever since cholecystectomy; consider bile related versus IBS  Plan: 1.  Scheduled patient for an EGD in the [place] with Dr. Ardis Hughs.  Did discuss risks, benefits, limitations and alternatives and patient agrees to proceed. 2.  Prescribed Omeprazole 40 mg daily, 30 minutes before breakfast #30 with 3 refills.  Did reiterate the importance of taking medications.  If she does not take the things prescribed then there is no way we can help her. 3.  Prescribed Cholestyramine once daily, we can increase this if necessary. 4.  Patient told me that soon she will need long-term disability paperwork if we need to keep her out of work longer.  Did briefly discuss that if she has endoscopy and there is no GI source for her symptoms then would recommend she continue to have pulmonology fill this out for her. 5.  Patient to follow in clinic per recommendations from Dr. Ardis Hughs after time of procedure.  Ellouise Newer, PA-C [facility] 06/12/2019, 2:43 PM  Cc: Tanda Rockers, MD

## 2019-06-13 NOTE — Progress Notes (Signed)
I agree with the above note, plan 

## 2019-06-27 ENCOUNTER — Ambulatory Visit: Payer: BC Managed Care – PPO | Admitting: Internal Medicine

## 2019-07-04 ENCOUNTER — Encounter: Payer: BC Managed Care – PPO | Admitting: Gastroenterology

## 2019-07-08 ENCOUNTER — Encounter: Payer: Self-pay | Admitting: Gastroenterology

## 2019-07-10 ENCOUNTER — Telehealth: Payer: Self-pay

## 2019-07-10 NOTE — Telephone Encounter (Signed)
Covid-19 screening questions   Do you now or have you had a fever in the last 14 days? NO   Do you have any respiratory symptoms of shortness of breath or cough now or in the last 14 days? NO  Do you have any family members or close contacts with diagnosed or suspected Covid-19 in the past 14 days? NO  Have you been tested for Covid-19 and found to be positive? NO        

## 2019-07-11 ENCOUNTER — Other Ambulatory Visit: Payer: Self-pay

## 2019-07-11 ENCOUNTER — Ambulatory Visit (AMBULATORY_SURGERY_CENTER): Payer: BC Managed Care – PPO | Admitting: Gastroenterology

## 2019-07-11 ENCOUNTER — Encounter: Payer: Self-pay | Admitting: Gastroenterology

## 2019-07-11 VITALS — BP 133/73 | HR 69 | Temp 98.0°F | Resp 16 | Ht 60.0 in | Wt 224.0 lb

## 2019-07-11 DIAGNOSIS — K2100 Gastro-esophageal reflux disease with esophagitis, without bleeding: Secondary | ICD-10-CM | POA: Diagnosis not present

## 2019-07-11 DIAGNOSIS — R1310 Dysphagia, unspecified: Secondary | ICD-10-CM

## 2019-07-11 DIAGNOSIS — R197 Diarrhea, unspecified: Secondary | ICD-10-CM

## 2019-07-11 MED ORDER — FAMOTIDINE 40 MG PO TABS: 40.0000 mg | ORAL_TABLET | Freq: Two times a day (BID) | ORAL | 3 refills | Status: DC

## 2019-07-11 MED ORDER — SODIUM CHLORIDE 0.9 % IV SOLN: 500.0000 mL | Freq: Once | INTRAVENOUS | Status: DC

## 2019-07-11 NOTE — Progress Notes (Signed)
Report given to PACU, vss 

## 2019-07-11 NOTE — Op Note (Signed)
[facility] Patient Name: Rachael DrapeRyian Tolley Procedure Date: 07/11/2019 9:53 AM MRN: [id number] Endoscopist: Rachael Feeaniel P Jacobs , MD Age: 33 Referring MD:  Date of Birth: 12/24/1985 Gender: Female Account #: [id number] Procedure:                Upper GI endoscopy Indications:              Dysphagia Medicines:                Monitored Anesthesia Care Procedure:                Pre-Anesthesia Assessment:                           - Prior to the procedure, a History and Physical                            was performed, and patient medications and                            allergies were reviewed. The patient's tolerance of                            previous anesthesia was also reviewed. The risks                            and benefits of the procedure and the sedation                            options and risks were discussed with the patient.                            All questions were answered, and informed consent                            was obtained. Prior Anticoagulants: The patient has                            taken no previous anticoagulant or antiplatelet                            agents. ASA Grade Assessment: II - A patient with                            mild systemic disease. After reviewing the risks                            and benefits, the patient was deemed in                            satisfactory condition to undergo the procedure.                           After obtaining informed consent, the endoscope was  passed under direct vision. Throughout the                            procedure, the patient's blood pressure, pulse, and                            oxygen saturations were monitored continuously. The                            Endoscope was introduced through the mouth, and                            advanced to the second part of duodenum. The upper                            GI endoscopy was accomplished without  difficulty.                            The patient tolerated the procedure well. Scope In: Scope Out: Findings:                 LA Grade B (one or more mucosal breaks greater than                            5 mm, not extending between the tops of two mucosal                            folds) esophagitis was found at the                            gastroesophageal junction.                           The exam was otherwise without abnormality. Complications:            No immediate complications. Estimated blood loss:                            None. Estimated Blood Loss:     Estimated blood loss: none. Impression:               - LA Grade B reflux esophagitis (acid reflux is                            damaging your esophagus and causing your swallowing                            problem).                           - The examination was otherwise normal. Recommendation:           - Patient has a contact number available for                            emergencies. The signs and symptoms of potential  delayed complications were discussed with the                            patient. Return to normal activities tomorrow.                            Written discharge instructions were provided to the                            patient.                           - Resume previous diet.                           - Continue present medications. Since you get                            bloating with PPI medicines, will prescribe higher                            strenght H2 blocker pepcid (famotidine). New script                            today for 40mg  BID (breakfast and dinner meals),                            disp 3 months with 3 refills.                           - Since you could not tolerate the cholestyramine                            powder, please start a single OTC imodium pill                            every morning shortly after waking.                            - Call Dr. Ardis Hughs' office in 6-8 weeks to report on                            your progress. Milus Banister, MD 07/11/2019 10:03:21 AM This report has been signed electronically.

## 2019-07-11 NOTE — Progress Notes (Signed)
Temp JB  vs CW 

## 2019-07-11 NOTE — Patient Instructions (Signed)
Famotidine 40 mg , take twice daily. Order sent to pharmacy.  Take over the counter  Imodium pill every morning after waking up.. call Dr Ardis Hughs office in 6-8 weeks to report on progress.   YOU HAD AN ENDOSCOPIC PROCEDURE TODAY AT [facility]:   Refer to the procedure report that was given to you for any specific questions about what was found during the examination.  If the procedure report does not answer your questions, please call your gastroenterologist to clarify.  If you requested that your care partner not be given the details of your procedure findings, then the procedure report has been included in a sealed envelope for you to review at your convenience later.  YOU SHOULD EXPECT: Some feelings of bloating in the abdomen. Passage of more gas than usual.  Walking can help get rid of the air that was put into your GI tract during the procedure and reduce the bloating. If you had a lower endoscopy (such as a colonoscopy or flexible sigmoidoscopy) you may notice spotting of blood in your stool or on the toilet paper. If you underwent a bowel prep for your procedure, you may not have a normal bowel movement for a few days.  Please Note:  You might notice some irritation and congestion in your nose or some drainage.  This is from the oxygen used during your procedure.  There is no need for concern and it should clear up in a day or so.  SYMPTOMS TO REPORT IMMEDIATELY:    Following upper endoscopy (EGD)  Vomiting of blood or coffee ground material  New chest pain or pain under the shoulder blades  Painful or persistently difficult swallowing  New shortness of breath  Fever of 100F or higher  Black, tarry-looking stools  For urgent or emergent issues, a gastroenterologist can be reached at any hour by calling [phone].   DIET:  We do recommend a small meal at first, but then you may proceed to your regular diet.  Drink plenty of fluids but you should avoid alcoholic  beverages for 24 hours.  ACTIVITY:  You should plan to take it easy for the rest of today and you should NOT DRIVE or use heavy machinery until tomorrow (because of the sedation medicines used during the test).    FOLLOW UP: Our staff will call the number listed on your records 48-72 hours following your procedure to check on you and address any questions or concerns that you may have regarding the information given to you following your procedure. If we do not reach you, we will leave a message.  We will attempt to reach you two times.  During this call, we will ask if you have developed any symptoms of COVID 19. If you develop any symptoms (ie: fever, flu-like symptoms, shortness of breath, cough etc.) before then, please call [phone].  If you test positive for Covid 19 in the 2 weeks post procedure, please call and report this information to [place].    If any biopsies were taken you will be contacted by phone or by letter within the next 1-3 weeks.  Please call us at [phone] if you have not heard about the biopsies in 3 weeks.    SIGNATURES/CONFIDENTIALITY: You and/or your care partner have signed paperwork which will be entered into your electronic medical record.  These signatures attest to the fact that that the information above on your After Visit Summary has been reviewed and is understood.  Full responsibility of the confidentiality of this discharge information lies with you and/or your care-partner.

## 2019-07-15 ENCOUNTER — Telehealth: Payer: Self-pay

## 2019-07-15 NOTE — Telephone Encounter (Signed)
  Follow up Call-  Call back number 07/11/2019  Post procedure Call Back phone  # [phone]  Permission to leave phone message Yes  Some recent data might be hidden     Patient questions:  Do you have a fever, pain , or abdominal swelling? No. Pain Score  0 *  Have you tolerated food without any problems? Yes.    Have you been able to return to your normal activities? Yes.    Do you have any questions about your discharge instructions: Diet   No. Medications  No. Follow up visit  No.  Do you have questions or concerns about your Care? No.  Actions: * If pain score is 4 or above: No action needed, pain <4.  1. Have you developed a fever since your procedure? no  2.   Have you had an respiratory symptoms (SOB or cough) since your procedure? no  3.   Have you tested positive for COVID 19 since your procedure no  4.   Have you had any family members/close contacts diagnosed with the COVID 19 since your procedure?  no   If yes to any of these questions please route to Joylene John, RN and Alphonsa Gin, Therapist, sports.

## 2019-07-15 NOTE — Telephone Encounter (Signed)
NO ANSWER, MESSAGE LEFT FOR PATIENT. 

## 2019-07-18 ENCOUNTER — Ambulatory Visit (INDEPENDENT_AMBULATORY_CARE_PROVIDER_SITE_OTHER): Payer: BC Managed Care – PPO | Admitting: Internal Medicine

## 2019-07-18 ENCOUNTER — Other Ambulatory Visit: Payer: Self-pay

## 2019-07-18 ENCOUNTER — Encounter: Payer: Self-pay | Admitting: Internal Medicine

## 2019-07-18 DIAGNOSIS — J4520 Mild intermittent asthma, uncomplicated: Secondary | ICD-10-CM | POA: Diagnosis not present

## 2019-07-18 DIAGNOSIS — R05 Cough: Secondary | ICD-10-CM | POA: Diagnosis not present

## 2019-07-18 DIAGNOSIS — R053 Chronic cough: Secondary | ICD-10-CM

## 2019-07-18 MED ORDER — GABAPENTIN 100 MG PO CAPS: 100.0000 mg | ORAL_CAPSULE | Freq: Three times a day (TID) | ORAL | 2 refills | Status: DC

## 2019-07-18 NOTE — Patient Instructions (Addendum)
For drainage / throat tickle try take CHLORPHENIRAMINE  4 mg  (Chlortab 4mg   at [employer] should be easiest to find in the green box)  take one every 4 hours as needed - available over the counter- may cause drowsiness so start with just a bedtime dose or two and see how you tolerate it before trying in daytime    Gabapentin 100 mg three times a day automatically   Be sure to Follow up Dr Ardis Hughs to follow up your heart burn   Please schedule a follow up office visit in 6 weeks, call sooner if needed with all medications /inhalers/ solutions in hand so we can verify exactly what you are taking. This includes all medications from all doctors and over the [place] separate them into two bags:  the ones you take automatically, no matter what, vs the ones you take just when you feel you need them "BAG #2 is UP TO YOU"  - this will really help [place] help you take your medications more effectively.

## 2019-07-18 NOTE — Progress Notes (Signed)
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 and did better  but never back to nl = could walk a mile s stopping slow pace but  struggled to do hills says was told this was due to  "asthma and allergy" and never restored to nl baseline with "outdoor allergies" despite dulera /singulair then acutely worse in march 18th 2020 suddenly short of breath/bad coughing fits  with minimal activity,aching all over  Fever x one week > [facility] PCP with initial  covid testing ng > self isolated   > repeat swab neg >  Fever, aches gone by return to PCP  then started pred/zpak > cxr 01/24/2019 neg so CTa > abnormal so referred to pulmonary clinic 02/07/2019 by Huey Romans PA      History of Present Illness  02/07/2019  Pulmonary/ 1st office eval/Steffanie Mingle  New cough/ worse  Since March 2020 Chief Complaint  Patient presents with  . Pulmonary Consult    Referred by [name], PA for eval of cough and SOB since March 2020.  Dyspnea:  MMRC3 = can't walk 100 yards even at a slow pace at a flat grade s stopping due to sob  / worse with voice use  Cough: worse with exertion/talking  Sleep: r sigde  down, flat bed no resp symptoms SABA use:  None x one week - no better  Cp s pleuritic / ex features always center of chest/ anterior, waxes and wanes x years rec Finish your prednisone For worse breathing  restart dulera but take the 100 up to 2 pffs every 12 hours if needed  Take delsym two tsp every 12 hours and supplement if needed with  Tylenol #3   up to 1-2 every 4 hours to suppress the urge to cough.  Once you have eliminated the cough for 3 straight days try reducing the Tylenol #3 first,  then the delsym as tolerated.     Protonix (pantoprazole) Take 30-60 min before first meal of the day and Pepcid 20 mg one after supper automatically x at least two weeks  GERD diet    04/17/2019  f/u ov/Tamea Bai re: cough daily / intol to protonix = bloating/ did not bring meds as req  Chief Complaint  Patient presents with  . Acute Visit    She has had increased reflux "burning in her throat".  She does not do well out in the heat with her breathing. She uses her albuterol inhaler at least 2 puffs per day.   Dyspnea:  MMRC1 = can walk nl pace, flat grade, can't hurry or go uphills or steps s sob   Cough: none, sensation of globus/ grinding airway "to keep from coughing"  Sleeping: on R side flat  SABA use: variable  but mostly when overdoes it  02: none  rec  Aciphex 20 mg Take 30- 60 min before your first and last meals of the day and pepcid 20 mg at bedtime  GERD diet  Prednisone 10 mg take  4 each am x 2 days,   2 each am x 2 days,  1 each am x 2 days and stop  For cough > delsym 2 tsp every 12 hours as needed  Please schedule a follow up office visit in 4 weeks, sooner if needed    05/16/2019  f/u ov/Lyall Faciane re: cough x 2017 assoc with gag / hb and incont on BCPs Chief Complaint  Patient presents with  . Follow-up    Patient reports that she still has a cough and sob. She states that she has to take shorter breaths because it she does it makes her cough. She reports that the Aciphex made her cough worse and caused irritation to her esophogus.  Dyspnea:  Good activity tol  if not coughing Cough: worse early am and before supper and still taking aciphex with sensation of overt HB  Sleeping: can't  do bed blocks, does wedge and slides down SABA use: no change  02: none rec Stop qvar Late add:  Prednisone 10 mg take  4 each am x 2 days,   2 each am x 2 days,  1 each am x 2 days and stop  For drainage / throat tickle try take CHLORPHENIRAMINE  4 mg  (Chlortab   at [employer] should be easiest to find in the green box)  Only use your albuterol as a rescue medication Take delsym two tsp every 12 hours and supplement if needed with  Tylenol #3   up to 1-2 every 4 hours to suppress the urge to cough. Swallowing water and/or using ice chips/non mint and menthol containing candies (such as lifesavers or sugarless jolly ranchers) are also effective.  You should rest your voice and avoid activities that you know make you cough. Once you have eliminated the cough for 3 straight days try reducing the Tylenol #3 first,  then the delsym as tolerated.   Continue aciphex, pepcid and singulair  Strongly consider vasectomy for your husband  We will arrange for a GI eval asap> done   Please schedule a follow up office visit in 2 weeks, sooner if needed  with all medications /inhalers/ solutions in hand so we can verify exactly what you are taking. This includes all medications from all doctors and over the counters   07/18/2019  f/u ov/Jakyren Fluegge re: uacs/ clinical gerd with Pos EGD  07/11/2019 / did not bring all meds  Chief Complaint  Patient presents with  . Follow-up    reflux seems better but she is still bothered by the cough. She has not been using her albuterol inhaler.    Dyspnea:  Walking more tol well though no aerobics  Cough: not using 1st gen H1 blockers per guidelines  /still occ  using tyl #3, cough is non-productive  Sleeping: restless but no  resp cc's lying flat SABA use: none 02: none    No obvious day to day or daytime variability or assoc excess/ purulent sputum or mucus plugs or hemoptysis or cp or chest tightness, subjective wheeze or overt sinus  symptoms.   Sleeping   without nocturnal  or early am exacerbation  of respiratory  c/o's or need for noct saba. Also denies any obvious fluctuation of symptoms with weather or  environmental changes or other aggravating or alleviating factors except as outlined above   No unusual exposure hx or h/o childhood pna/ asthma or knowledge of premature birth.  Current Allergies, Complete Past Medical History, Past Surgical History, Family History, and Social History were reviewed in [employer] record.  ROS  The following are not active complaints unless bolded Hoarseness, sore throat, dysphagia, dental problems, itching, sneezing,  nasal congestion or discharge of excess mucus or purulent secretions, ear ache,   fever, chills, sweats, unintended wt loss or wt gain, classically pleuritic or exertional cp,  orthopnea pnd or arm/hand swelling  or leg swelling, presyncope, palpitations, abdominal pain, anorexia, nausea, vomiting, diarrhea  or change in bowel habits or change in bladder habits, change in stools or change in urine, dysuria, hematuria,  rash, arthralgias, visual complaints, headache, numbness, weakness or ataxia or problems with walking or coordination,  change in mood or  memory.        Current Meds  Medication Sig  . acetaminophen-codeine (TYLENOL #3) 300-30 MG tablet Take 1-2 tablets by mouth every 4 (four) hours as needed (cough).  Marland Kitchen albuterol (VENTOLIN HFA) 108 (90 Base) MCG/ACT inhaler Inhale 2 puffs into the lungs every 4 (four) hours as needed.  Marland Kitchen EPINEPHrine (EPIPEN 2-PAK) 0.3 mg/0.3 mL IJ SOAJ injection Inject 0.3 mg into the muscle once as needed (for severe allergic reaction).  . famotidine (PEPCID) 40 MG tablet Take 1 tablet (40 mg total) by mouth 2 (two) times daily. Take with breakfast and dinner meal  . loperamide (IMODIUM A-D) 2 MG tablet Take 2 mg by mouth daily.  . montelukast (SINGULAIR) 10 MG tablet Take by mouth.  . norgestrel-ethinyl estradiol (LO/OVRAL,CRYSELLE) 0.3-30  MG-MCG tablet Take 1 tablet by mouth at bedtime.   Marland Kitchen omeprazole (PRILOSEC) 40 MG capsule Take 1 capsule (40 mg total) by mouth daily before breakfast.  . Solriamfetol HCl (SUNOSI) 75 MG TABS Take 75 mg by mouth 2 (two) times daily. Take one tablet upon waking up and then another tablet 4 hrs later if needed  . traZODone (DESYREL) 50 MG tablet TAKE 1 TABLET BY MOUTH EVERYDAY AT BEDTIME                              Past Medical History:  Diagnosis Date  . Anemia    takes Ferrous Sulfate daily  . Anxiety    takes Klonopin nightly  . ASTHMA NOS W/ACUTE EXACERBATION 01/05/2010   exercise induced  . Back pain   . Coarse tremors   . Degenerative disc disease    back  . Gallstones   . GERD (gastroesophageal reflux disease)    but doesn't take anything for it  . History of kidney stones   . History of migraine    last one a couple of days ago  . History of UTI    frequent  . IBS (irritable bowel syndrome)    doesn't take any meds  . Medullary sponge kidney 2007   kidneys can be sick but she isnt sick  . MIGRAINES, HX OF 06/24/2007  . Narcolepsy   . 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 OCD (obsessive compulsive disorder)    takes Prozac daily  . PYELONEPHRITIS 06/24/2007  . Seizures ([place])    takes Lamictal daily;last seizure was in 11/14/10  . Urinary frequency        Objective:     amb obese wf nad / classic voice fatigue  then starts coughing on insp    07/18/2019     223  05/16/2019       224   04/17/19 220 lb 12.8 oz (100.2 kg)  02/07/19 210 lb (95.3 kg)  10/31/18 194 lb 3.2 oz (88.1 kg)    Vital signs reviewed - Note on arrival 02 sats  98% on RA      HEENT : pt wearing mask not removed for exam due to covid -19 concerns.    NECK :  without JVD/Nodes/TM/ nl carotid upstrokes bilaterally   LUNGS: no acc muscle use,  Nl contour chest which is clear to A and P bilaterally without cough on insp or exp maneuvers   CV:  RRR  no s3 or murmur or increase in P2, and  no edema   ABD:  soft and nontender with nl inspiratory excursion in the supine position. No bruits or organomegaly appreciated, bowel sounds nl  MS:  Nl gait/ ext warm without deformities, calf tenderness, cyanosis or clubbing No obvious joint restrictions   SKIN: warm and dry without lesions    NEURO:  alert, approp, nl sensorium with  no motor or cerebellar deficits apparent.         Assessment

## 2019-07-20 ENCOUNTER — Encounter: Payer: Self-pay | Admitting: Internal Medicine

## 2019-07-20 NOTE — Assessment & Plan Note (Signed)
Onset March 2020 with viral illness and covid 19 neg pcr x 2  - max rx for cyclical coughing/ stop ics 02/07/2019  -  02/21/2019 still coughing but not doing cyclical cough regimen and ppi causing bloating so changed to h2 bid and repeat the protocol x 3 days then ov > 04/17/2019 still throat clearing - 04/17/2019 changed to aciphex bid    - 8/21/2020add 1st gen H1 blockers per guidelines  > did not start > rec repeat rec 07/18/2019  - added gabapentin 100 mg tid 07/20/2019 >>>    Of the three most common causes of  Sub-acute / recurrent or chronic cough, only one (GERD)  can actually contribute to/ trigger  the other two (asthma and post nasal drip syndrome)  and perpetuate the cylce of cough.  While not intuitively obvious, many patients with chronic low grade reflux do not cough until there is a primary insult that disturbs the protective epithelial barrier and exposes sensitive nerve endings.   This is typically viral but can due to PNDS and  either may apply here.   The point is that once this occurs, it is difficult to eliminate the cycle  using anything but a maximally effective acid suppression regimen at least in the short run, accompanied by an appropriate diet to address non acid GERD and control / eliminate the cough itself with gabapentin 100 tid and titrate up as needed until no longer needs any tylenol #3    Discussed in detail all the  indications, usual  risks and alternatives  relative to the benefits with patient who agrees to proceed with rx as outlined.    Advised will need to be able to control the cough and avoid work exposures to fumes/ dust/ smoker so better to work from home if possible for now.   I had an extended discussion with the patient reviewing all relevant studies completed to date and  lasting 15 to 20 minutes of a 25 minute visit    Each maintenance medication was reviewed in detail including most importantly the difference between maintenance and prns and under  what circumstances the prns are to be triggered using an action plan format that is not reflected in the computer generated alphabetically organized AVS.     Please see AVS for specific instructions unique to this visit that I personally wrote and verbalized to the the pt in detail and then reviewed with pt  by my nurse highlighting any  changes in therapy recommended at today's visit to their plan of care.

## 2019-07-20 NOTE — Assessment & Plan Note (Addendum)
Onset 2017 though ? 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 Trial Qvar 52mg 2bid > d/c 05/16/2019 as no better and may be aggravating UACS - Allergy profile 05/16/19 >  Eos 110 /  IgE  12 with RAST pos grass, cedar, dust   On singulair all goals of chronic asthma (if that's what this really is)  control met including optimal function and elimination of symptoms with minimal need for rescue therapy.  Contingencies discussed in full including contacting this office immediately if not controlling the symptoms using the rule of two's.    rec control upper airway symptoms as above

## 2019-07-22 NOTE — Telephone Encounter (Signed)
Left message on vm for follow upcall

## 2019-08-29 ENCOUNTER — Other Ambulatory Visit: Payer: Self-pay

## 2019-08-29 ENCOUNTER — Ambulatory Visit (INDEPENDENT_AMBULATORY_CARE_PROVIDER_SITE_OTHER): Payer: BC Managed Care – PPO

## 2019-08-29 ENCOUNTER — Ambulatory Visit (INDEPENDENT_AMBULATORY_CARE_PROVIDER_SITE_OTHER): Payer: BC Managed Care – PPO | Admitting: Internal Medicine

## 2019-08-29 ENCOUNTER — Encounter: Payer: Self-pay | Admitting: Internal Medicine

## 2019-08-29 DIAGNOSIS — R053 Chronic cough: Secondary | ICD-10-CM

## 2019-08-29 DIAGNOSIS — J4520 Mild intermittent asthma, uncomplicated: Secondary | ICD-10-CM

## 2019-08-29 DIAGNOSIS — R05 Cough: Secondary | ICD-10-CM

## 2019-08-29 MED ORDER — PREDNISONE 10 MG PO TABS: ORAL_TABLET | ORAL | 0 refills | Status: DC

## 2019-08-29 MED ORDER — MONTELUKAST SODIUM 10 MG PO TABS: ORAL_TABLET | ORAL | Status: AC

## 2019-08-29 MED ORDER — FAMOTIDINE 40 MG PO TABS: 40.0000 mg | ORAL_TABLET | Freq: Two times a day (BID) | ORAL | 3 refills | Status: DC

## 2019-08-29 MED ORDER — ACETAMINOPHEN-CODEINE #3 300-30 MG PO TABS: 1.0000 | ORAL_TABLET | ORAL | 0 refills | Status: DC | PRN

## 2019-08-29 NOTE — Patient Instructions (Addendum)
Discuss with your GYN other options for birth control that do not involve taking progesterone which can make reflux worse   Pepcid 40 mg twice daily after bfast and supper   Take delsym two tsp every 12 hours and supplement if needed with  Tylenol #3   up to 1-2 every 4 hours to suppress the urge to cough. Swallowing water and/or using ice chips/non mint and menthol containing candies (such as lifesavers or sugarless jolly ranchers) are also effective.  You should rest your voice and avoid activities that you know make you cough.  Once you have eliminated the cough for 3 straight days try reducing the Tylenol #3 first,  then the delsym as tolerated.     Continue gabapentin but increase to 100 mg four times daily   Prednisone 10 mg take  4 each am x 2 days,   2 each am x 2 days,  1 each am x 2 days and stop   Please schedule a follow up office visit in 2 weeks, sooner if needed  with all medications /inhalers/ solutions in hand so we can verify exactly what you are taking. This includes all medications from all doctors and over the counters

## 2019-08-29 NOTE — Progress Notes (Signed)
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 and did better  but never back to nl = could walk a mile s stopping slow pace but  struggled to do hills says was told this was due to  "asthma and allergy" and never restored to nl baseline with "outdoor allergies" despite dulera /singulair then acutely worse in march 18th 2020 suddenly short of breath/bad coughing fits  with minimal activity,aching all over  Fever x one week > [facility] PCP with initial  covid testing ng > self isolated   > repeat swab neg >  Fever, aches gone by return to PCP  then started pred/zpak > cxr 01/24/2019 neg so CTa > abnormal so referred to pulmonary clinic 02/07/2019 by Huey Romans PA      History of Present Illness  02/07/2019  Pulmonary/ 1st office eval/Wert  New cough/ worse  Since March 2020 Chief Complaint  Patient presents with  . Pulmonary Consult    Referred by [name], PA for eval of cough and SOB since March 2020.  Dyspnea:  MMRC3 = can't walk 100 yards even at a slow pace at a flat grade s stopping due to sob  / worse with voice use  Cough: worse with exertion/talking  Sleep: r sigde  down, flat bed no resp symptoms SABA use:  None x one week - no better  Cp s pleuritic / ex features always center of chest/ anterior, waxes and wanes x years rec Finish your prednisone For worse breathing  restart dulera but take the 100 up to 2 pffs every 12 hours if needed  Take delsym two tsp every 12 hours and supplement if needed with  Tylenol #3   up to 1-2 every 4 hours to suppress the urge to cough.  Once you have eliminated the cough for 3 straight days try reducing the Tylenol #3 first,  then the delsym as tolerated.     Protonix (pantoprazole) Take 30-60 min before first meal of the day and Pepcid 20 mg one after supper automatically x at least two weeks  GERD diet    04/17/2019  f/u ov/Wert re: cough daily / intol to protonix = bloating/ did not bring meds as req  Chief Complaint  Patient presents with  . Acute Visit    She has had increased reflux "burning in her throat".  She does not do well out in the heat with her breathing. She uses her albuterol inhaler at least 2 puffs per day.   Dyspnea:  MMRC1 = can walk nl pace, flat grade, can't hurry or go uphills or steps s sob   Cough: none, sensation of globus/ grinding airway "to keep from coughing"  Sleeping: on R side flat  SABA use: variable  but mostly when overdoes it  02: none  rec  Aciphex 20 mg Take 30- 60 min before your first and last meals of the day and pepcid 20 mg at bedtime  GERD diet  Prednisone 10 mg take  4 each am x 2 days,   2 each am x 2 days,  1 each am x 2 days and stop  For cough > delsym 2 tsp every 12 hours as needed  Please schedule a follow up office visit in 4 weeks, sooner if needed    05/16/2019  f/u ov/Wert re: cough x 2017 assoc with gag / hb and incont on BCPs Chief Complaint  Patient presents with  . Follow-up    Patient reports that she still has a cough and sob. She states that she has to take shorter breaths because it she does it makes her cough. She reports that the Aciphex made her cough worse and caused irritation to her esophogus.  Dyspnea:  Good activity tol  if not coughing Cough: worse early am and before supper and still taking aciphex with sensation of overt HB  Sleeping: can't  do bed blocks, does wedge and slides down SABA use: no change  02: none rec Stop qvar Late add:  Prednisone 10 mg take  4 each am x 2 days,   2 each am x 2 days,  1 each am x 2 days and stop  For drainage / throat tickle try take CHLORPHENIRAMINE  4 mg  (Chlortab 4mg   at [employer] should be easiest to find in the green box)  Only use your albuterol as a rescue medication Take delsym two tsp every 12 hours and supplement if needed with  Tylenol #3   up to 1-2 every 4 hours to suppress the urge to cough. Swallowing water and/or using ice chips/non mint and menthol containing candies (such as lifesavers or sugarless jolly ranchers) are also effective.  You should rest your voice and avoid activities that you know make you cough. Once you have eliminated the cough for 3 straight days try reducing the Tylenol #3 first,  then the delsym as tolerated.   Continue aciphex, pepcid and singulair  Strongly consider vasectomy for your husband  We will arrange for a GI eval asap> done   Please schedule a follow up office visit in 2 weeks, sooner if needed  with all medications /inhalers/ solutions in hand so we can verify exactly what you are taking. This includes all medications from all doctors and over the counters   07/18/2019  f/u ov/Wert re: uacs/ clinical gerd with Pos EGD  07/11/2019 for LA Grade B reflux esophagitis  / did not bring all meds  Chief Complaint  Patient presents with  . Follow-up    reflux seems better but she is still bothered by the cough. She has not been using her albuterol inhaler.    Dyspnea:  Walking more tol well though no aerobics  Cough: not using 1st gen H1 blockers per guidelines  /still occ  using tyl #3, cough is non-productive  Sleeping: restless but no  resp cc's lying flat SABA use: none 02: none  rec For drainage / throat tickle try take CHLORPHENIRAMINE  4 mg  (Chlortab 4mg   at [employer] should be easiest to find in the green box)  take one  every 4 hours as needed  Gabapentin 100 mg three times a day automatically  Be sure to Follow up Dr Ardis Hughs to follow up  your heart burn > not done      08/29/2019  Acute extended  ov/Wert re: uacs / was some better until one week prior to OV Could not tol ppi, did not know to take h2's    Chief Complaint  Patient presents with  . Acute Visit  Dyspnea:  Sob even if not coughing if doing housework Cough: severe harsh not productive day > noct x one week  Sleeping: robitussin dm night time does better than day  SABA uses twice daily no longer on singulair      No obvious pattenrs in  day to day or daytime variability or assoc excess/ purulent sputum or mucus plugs or hemoptysis or cp or chest tightness, subjective wheeze or overt sinus or hb symptoms.   Sleeping p rob dm  without nocturnal  or early am exacerbation  of respiratory  c/o's or need for noct saba. Also denies any obvious fluctuation of symptoms with weather or environmental changes or other aggravating or alleviating factors except as outlined above   No unusual exposure hx or h/o childhood pna/ asthma or knowledge of premature birth.  Current Allergies, Complete Past Medical History, Past Surgical History, Family History, and Social History were reviewed in [employer] record.  ROS  The following are not active complaints unless bolded Hoarseness, sore throat, dysphagia, dental problems, itching, sneezing,  nasal congestion or discharge of excess mucus or purulent secretions, ear ache,   fever, chills, sweats, unintended wt loss or wt gain, classically pleuritic or exertional cp,  orthopnea pnd or arm/hand swelling  or leg swelling, presyncope, palpitations, abdominal pain, anorexia, nausea, vomiting, diarrhea  or change in bowel habits or change in bladder habits, change in stools or change in urine, dysuria, hematuria,  rash, arthralgias, visual complaints, headache, numbness, weakness or ataxia or  problems with walking or coordination,  change in mood or  memory.        Current Meds  Medication Sig  . acetaminophen-codeine (TYLENOL #3) 300-30 MG tablet Take 1-2 tablets by mouth every 4 (four) hours as needed (cough).  Marland Kitchen albuterol (VENTOLIN HFA) 108 (90 Base) MCG/ACT inhaler Inhale 2 puffs into the lungs every 4 (four) hours as needed.  Marland Kitchen EPINEPHrine (EPIPEN 2-PAK) 0.3 mg/0.3 mL IJ SOAJ injection Inject 0.3 mg into the muscle once as needed (for severe allergic reaction).  . gabapentin (NEURONTIN) 100 MG capsule Take 1 capsule (100 mg total) by mouth 3 (three) times daily. One three times daily  . guaiFENesin-dextromethorphan (ROBITUSSIN DM) 100-10 MG/5ML syrup Take 5 mLs by mouth every 4 (four) hours as needed for cough.  . loperamide (IMODIUM A-D) 2 MG tablet Take 2 mg by mouth daily.  . norgestrel-ethinyl estradiol (CRYSELLE-28) 0.3-30 MG-MCG tablet Take 1 tablet by mouth daily.  . traZODone (DESYREL) 50 MG tablet TAKE 1 TABLET BY MOUTH EVERYDAY AT BEDTIME                     Past Medical History:  Diagnosis Date  . Anemia    takes Ferrous Sulfate daily  . Anxiety    takes Klonopin nightly  . ASTHMA NOS W/ACUTE EXACERBATION 01/05/2010   exercise induced  . Back pain   . Coarse tremors   . Degenerative disc disease    back  . Gallstones   . GERD (gastroesophageal reflux disease)    but doesn't take anything for it  . History of kidney stones   . History of migraine  last one a couple of days ago  . History of UTI    frequent  . IBS (irritable bowel syndrome)    doesn't take any meds  . Medullary sponge kidney 2007   kidneys can be sick but she isnt sick  . MIGRAINES, HX OF 06/24/2007  . Narcolepsy   . 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 OCD (obsessive compulsive disorder)    takes Prozac daily  . PYELONEPHRITIS 06/24/2007  . Seizures (HCC)    takes Lamictal daily;last seizure was in 11/14/10  . Urinary frequency        Objective:        08/29/2019   07/18/2019     223  05/16/2019       224   04/17/19 220 lb 12.8 oz (100.2 kg)  02/07/19 210 lb (95.3 kg)  10/31/18 194 lb 3.2 oz (88.1 kg)    Vital signs reviewed - Note on arrival 02 sats  98% on RA     HEENT : pt wearing mask not removed for exam due to covid -19 concerns.    NECK :  without JVD/Nodes/TM/ nl carotid upstrokes bilaterally   LUNGS: no acc muscle use,  Nl contour chest which is clear to A and P bilaterally with severe  cough on insp maneuvers   CV:  RRR  no s3 or murmur or increase in P2, and no edema   ABD:  soft and nontender with nl inspiratory excursion in the supine position. No bruits or organomegaly appreciated, bowel sounds nl  MS:  Nl gait/ ext warm without deformities, calf tenderness, cyanosis or clubbing No obvious joint restrictions   SKIN: warm and dry without lesions    NEURO:  alert, approp, nl sensorium with  no motor or cerebellar deficits apparent.       CXR PA and Lateral:   08/29/2019 :    I personally reviewed images and agree with radiology impression as follows:   No active cardiopulmonary disease.   Assessment

## 2019-08-29 NOTE — Progress Notes (Signed)
Spoke with pt and notified of results per Dr. Wert. Pt verbalized understanding and denied any questions. 

## 2019-08-31 ENCOUNTER — Encounter: Payer: Self-pay | Admitting: Internal Medicine

## 2019-08-31 NOTE — Assessment & Plan Note (Signed)
Onset 2017 though ? 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 Trial Qvar 16mcg 2bid > d/c 05/16/2019 as no better and may be aggravating UACS - Allergy profile 05/16/19 >  Eos 110 /  IgE  12 with RAST pos grass, cedar, dust  - restarted singulair 08/29/2019   Cough on inspiration plus absence of noct flares or elimination of symptoms with dulera or saba against asthma though note she does have mild atopy. Since has such prominent UACS reluctant to add back ICS so added singulair and short course pred instead.

## 2019-08-31 NOTE — Assessment & Plan Note (Addendum)
Onset March 2020 with viral illness and covid 19 neg pcr x 2  - max rx for cyclical coughing/ stop ics 02/07/2019  -  02/21/2019 still coughing but not doing cyclical cough regimen and ppi causing bloating so changed to h2 bid and repeat the protocol x 3 days then ov > 04/17/2019 still throat clearing - 04/17/2019 changed to aciphex bid    - 8/21/2020add 1st gen H1 blockers per guidelines  > did not start > rec repeat rec 07/18/2019  - added gabapentin 100 mg tid 07/20/2019    >>>  increased to 100 mg qid  08/29/2019 and resumed H2 and singulair (ppi intol)  Of the three most common causes of  Sub-acute / recurrent or chronic cough, only one (GERD, which we know she has and also has intol to PPi with  MO/ BCP use complicating the problem )  can actually contribute to/ trigger  the other two (asthma and post nasal drip syndrome)  and perpetuate the cylce of cough.  While not intuitively obvious, many patients with chronic  reflux do not cough until there is a primary insult that disturbs the protective epithelial barrier and exposes sensitive nerve endings.   This is typically viral but can due to PNDS and  either may apply here.  >>>    The point is that once this occurs, it is difficult to eliminate the cycle  using anything but a maximally tolerated acid suppression regimen (apparently the only rx she tolerates is PEPCID pending f/u with Dr Ardis Hughs)  at least in the short run, accompanied by an appropriate diet to address non acid GERD and control / eliminate the cough itself for at least 3 days with codeine plus added 6 days of Prednisone in case of component of Th-2 driven upper or lower airways inflammation (if cough responds short term only to relapse befor return while will on rx for uacs that would point to allergic rhinitis/ asthma or eos bronchitis) .   F/u in 2 weeks with all meds in hand using a trust but verify approach to confirm accurate Medication  Reconciliation The principal here is that  until we are certain that the  patients are doing what we've asked, it makes no sense to ask them to do more.   I had an extended discussion with the patient reviewing all relevant studies completed to date and  lasting 25 minutes of a 40 minute acute office  visit    Each maintenance medication was reviewed in detail including most importantly the difference between maintenance and prns and under what circumstances the prns are to be triggered using an action plan format that is not reflected in the computer generated alphabetically organized AVS.     Please see AVS for specific instructions unique to this visit that I personally wrote and verbalized to the the pt in detail and then reviewed with pt  by my nurse highlighting any  changes in therapy recommended at today's visit to their plan of care.

## 2019-09-05 ENCOUNTER — Other Ambulatory Visit: Payer: Self-pay | Admitting: Adult Health

## 2019-09-11 ENCOUNTER — Telehealth: Payer: Self-pay | Admitting: Internal Medicine

## 2019-09-11 NOTE — Telephone Encounter (Signed)
Rec'd completed paperwork - fwd to [facility] via interoffice mail -pr  

## 2019-09-11 NOTE — Telephone Encounter (Signed)
Done

## 2019-09-11 NOTE — Telephone Encounter (Signed)
Given over to [place], thanks

## 2019-09-11 NOTE — Telephone Encounter (Signed)
Placed in Dr Gustavus Bryant lookat to be completed

## 2019-09-12 ENCOUNTER — Ambulatory Visit: Payer: BC Managed Care – PPO | Admitting: Internal Medicine

## 2019-10-11 ENCOUNTER — Emergency Department (HOSPITAL_COMMUNITY): Payer: BC Managed Care – PPO

## 2019-10-11 ENCOUNTER — Emergency Department (HOSPITAL_COMMUNITY)
Admission: EM | Admit: 2019-10-11 | Discharge: 2019-10-12 | Disposition: A | Discharge status: Home / Self Care | Payer: BC Managed Care – PPO | Attending: Emergency Medicine | Admitting: Emergency Medicine

## 2019-10-11 ENCOUNTER — Other Ambulatory Visit: Payer: Self-pay

## 2019-10-11 DIAGNOSIS — U071 COVID-19: Secondary | ICD-10-CM | POA: Insufficient documentation

## 2019-10-11 DIAGNOSIS — G40909 Epilepsy, unspecified, not intractable, without status epilepticus: Secondary | ICD-10-CM | POA: Insufficient documentation

## 2019-10-11 DIAGNOSIS — R531 Weakness: Secondary | ICD-10-CM | POA: Diagnosis present

## 2019-10-11 DIAGNOSIS — R9089 Other abnormal findings on diagnostic imaging of central nervous system: Secondary | ICD-10-CM | POA: Insufficient documentation

## 2019-10-11 DIAGNOSIS — I671 Cerebral aneurysm, nonruptured: Secondary | ICD-10-CM

## 2019-10-11 DIAGNOSIS — R519 Headache, unspecified: Secondary | ICD-10-CM | POA: Diagnosis not present

## 2019-10-11 DIAGNOSIS — Z79899 Other long term (current) drug therapy: Secondary | ICD-10-CM | POA: Diagnosis not present

## 2019-10-11 LAB — COMPREHENSIVE METABOLIC PANEL
ALT: 38 U/L (ref 0–44)
AST: 39 U/L (ref 15–41)
Albumin: 3.4 g/dL — ABNORMAL LOW (ref 3.5–5.0)
Alkaline Phosphatase: 69 U/L (ref 38–126)
Anion gap: 12 (ref 5–15)
BUN: 6 mg/dL (ref 6–20)
CO2: 21 mmol/L — ABNORMAL LOW (ref 22–32)
Calcium: 8.7 mg/dL — ABNORMAL LOW (ref 8.9–10.3)
Chloride: 104 mmol/L (ref 98–111)
Creatinine, Ser: 0.91 mg/dL (ref 0.44–1.00)
GFR calc Af Amer: >60 mL/min (ref >60)
GFR calc non Af Amer: >60 mL/min (ref >60)
Glucose, Bld: 181 mg/dL — ABNORMAL HIGH (ref 70–99)
Potassium: 3.8 mmol/L (ref 3.5–5.1)
Sodium: 137 mmol/L (ref 135–145)
Total Bilirubin: 0.4 mg/dL (ref 0.3–1.2)
Total Protein: 7.5 g/dL (ref 6.5–8.1)

## 2019-10-11 LAB — I-STAT CHEM 8, ED
BUN: 6 mg/dL (ref 6–20)
Calcium, Ion: 1.08 mmol/L — ABNORMAL LOW (ref 1.15–1.40)
Chloride: 105 mmol/L (ref 98–111)
Creatinine, Ser: 0.7 mg/dL (ref 0.44–1.00)
Glucose, Bld: 153 mg/dL — ABNORMAL HIGH (ref 70–99)
HCT: 36 % (ref 36.0–46.0)
Hemoglobin: 12.2 g/dL (ref 12.0–15.0)
Potassium: 4.6 mmol/L (ref 3.5–5.1)
Sodium: 137 mmol/L (ref 135–145)
TCO2: 24 mmol/L (ref 22–32)

## 2019-10-11 LAB — CBC
HCT: 41.4 % (ref 36.0–46.0)
Hemoglobin: 13.6 g/dL (ref 12.0–15.0)
MCH: 27.9 pg (ref 26.0–34.0)
MCHC: 32.9 g/dL (ref 30.0–36.0)
MCV: 85 fL (ref 80.0–100.0)
Platelets: 373 10*3/uL (ref 150–400)
RBC: 4.87 MIL/uL (ref 3.87–5.11)
RDW: 11.9 % (ref 11.5–15.5)
WBC: 3.8 10*3/uL — ABNORMAL LOW (ref 4.0–10.5)
nRBC: 0 % (ref 0.0–0.2)

## 2019-10-11 LAB — I-STAT BETA HCG BLOOD, ED (MC, WL, AP ONLY): I-stat hCG, quantitative: <5 m[IU]/mL (ref <5)

## 2019-10-11 LAB — DIFFERENTIAL
Abs Immature Granulocytes: 0.01 10*3/uL (ref 0.00–0.07)
Basophils Absolute: 0 10*3/uL (ref 0.0–0.1)
Basophils Relative: 0 %
Eosinophils Absolute: 0 10*3/uL (ref 0.0–0.5)
Eosinophils Relative: 0 %
Immature Granulocytes: 0 %
Lymphocytes Relative: 17 %
Lymphs Abs: 0.7 10*3/uL (ref 0.7–4.0)
Monocytes Absolute: 0.1 10*3/uL (ref 0.1–1.0)
Monocytes Relative: 3 %
Neutro Abs: 3.1 10*3/uL (ref 1.7–7.7)
Neutrophils Relative %: 80 %

## 2019-10-11 LAB — APTT: aPTT: 29 seconds (ref 24–36)

## 2019-10-11 LAB — PROTIME-INR
INR: 1 (ref 0.8–1.2)
Prothrombin Time: 12.7 seconds (ref 11.4–15.2)

## 2019-10-11 LAB — ETHANOL: Alcohol, Ethyl (B): <10 mg/dL (ref <10)

## 2019-10-11 LAB — CBG MONITORING, ED: Glucose-Capillary: 164 mg/dL — ABNORMAL HIGH (ref 70–99)

## 2019-10-11 MED ORDER — SODIUM CHLORIDE 0.9% FLUSH: 3.0000 mL | Freq: Once | INTRAVENOUS | Status: DC

## 2019-10-11 MED ORDER — IOHEXOL 350 MG/ML SOLN
75.0000 mL | Freq: Once | INTRAVENOUS | Status: AC | PRN
  Administered 2019-10-11: 75 mL via INTRAVENOUS

## 2019-10-11 NOTE — ED Triage Notes (Signed)
LKN 1200  Pt BIB [facility] from home. States she layed down to take a nap at noon. Woke up arounmd 5:30 pm with stroke like symptoms.  C/o left side weakness, L arm droop, and decreased sensation.   Code stroke not activated at this time.   EMS VS BP 148/90 HR 118 SpO 98 CBG 219

## 2019-10-11 NOTE — ED Notes (Signed)
Transported to CT 

## 2019-10-11 NOTE — ED Provider Notes (Signed)
[facility] EMERGENCY DEPARTMENT Provider Note   CSN: [id number] Arrival date & time: 10/11/19  2147     History No chief complaint on file.   [name] is a 34 y.o. female.  The history is provided by the patient, the EMS personnel and medical records. No language interpreter was used.   [name] is a 34 y.o. female who presents to the Emergency Department complaining of weakness. She presents the emergency department by EMS for evaluation of left face, arm and leg weakness that began when she woke up at 530 this afternoon. She was last known well at noon when she laid down for a nap. Her symptoms have progressively worsened since they started. She was recently diagnosed with COVID-19 infection (Wednesday of this week). She denies any chest pain. She does have associated abdominal discomfort. No prior similar symptoms. She has a history of narcolepsy and epilepsy. It has been more than seven years since her last seizure. Symptoms are severe, constant, worsening.    Past Medical History:  Diagnosis Date  . Anemia    takes Ferrous Sulfate daily  . Anxiety    takes Klonopin nightly  . ASTHMA NOS W/ACUTE EXACERBATION 01/05/2010   exercise induced  . Back pain   . Coarse tremors   . Degenerative disc disease    back  . Gallstones   . GERD (gastroesophageal reflux disease)    but doesn't take anything for it  . History of kidney stones   . History of migraine    last one a couple of days ago  . History of UTI    frequent  . IBS (irritable bowel syndrome)    doesn't take any meds  . Medullary sponge kidney 2007   kidneys can be sick but she isnt sick  . MIGRAINES, HX OF 06/24/2007  . Narcolepsy   . 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 OCD (obsessive compulsive disorder)    takes Prozac daily  . PYELONEPHRITIS 06/24/2007  . Seizures (HCC)    takes Lamictal daily;last seizure was in 11/14/10  . Urinary frequency     Patient Active Problem List   Diagnosis Date  Noted  . GERD (gastroesophageal reflux disease) 05/16/2019  . Mild intermittent asthma vs UACS/cyclical coughing  02/08/2019  . Abnormal CT of the chest 02/08/2019  . DOE (dyspnea on exertion) 02/08/2019  . Class 3 severe obesity with body mass index (BMI) of 40.0 to 44.9 in adult (HCC) 08/13/2017  . 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 Hypersomnia, persistent 08/13/2017  . 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 S/P laparoscopic cholecystectomy 12/02/2013  . 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  . H/O abnormal cervical Papanicolaou smear 09/29/2013  . OCD (obsessive compulsive disorder)   . Convulsions/seizures (HCC) 04/21/2013  . Anxiety 04/21/2013  . 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 Chronic cough 10/17/2011  . 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 ASTHMA NOS W/ACUTE EXACERBATION 01/05/2010  . ABSENCE OF MENSTRUATION 10/27/2009  . MIGRAINES, HX OF 06/24/2007    Past Surgical History:  Procedure Laterality Date  . BACK SURGERY    . CHOLECYSTECTOMY N/A 12/02/2013   Procedure: LAPAROSCOPIC CHOLECYSTECTOMY WITH ATTEMPTED INTRAOPERATIVE CHOLANGIOGRAM;  Surgeon: Atilano Ina, MD;  Location: [facility] OR;  Service: General;  Laterality: N/A;  . LUMBAR LAMINECTOMY/DECOMPRESSION MICRODISCECTOMY  07/24/2012   Procedure: LUMBAR LAMINECTOMY/DECOMPRESSION MICRODISCECTOMY;  Surgeon: Jacki Cones, MD;  Location: [facility] ORS;  Service: Orthopedics;  Laterality: Right;  L5-S1  . WISDOM  TOOTH EXTRACTION       OB History    Gravida  3   Para  2   Term  2   Preterm  0   AB  0   Living  2     SAB  0   TAB  0   Ectopic  0   Multiple  0   Live Births  2           Family History  Problem Relation Age of Onset  . Heart disease Mother   . ADD / ADHD Sister   . Lung cancer Maternal Grandmother        smoked  . Anesthesia problems Neg Hx     Social History   Tobacco Use  . Smoking status: Never Smoker  .  Smokeless tobacco: Never Used  Substance Use Topics  . Alcohol use: No    Alcohol/week: 0.0 standard drinks    Comment: occas. when not pregnant  . Drug use: No    Home Medications Prior to Admission medications   Medication Sig Start Date End Date Taking? Authorizing Provider  acetaminophen-codeine (TYLENOL #3) 300-30 MG tablet Take 1-2 tablets by mouth every 4 (four) hours as needed (cough). 08/29/19   Tanda Rockers, MD  albuterol (VENTOLIN HFA) 108 (90 Base) MCG/ACT inhaler Inhale 2 puffs into the lungs every 4 (four) hours as needed. 03/17/19   [provider]  cholestyramine (QUESTRAN) 4 g packet Take 1 packet (4 g total) by mouth once for 1 dose. 06/12/19 06/12/19  Levin Erp, PA  EPINEPHrine (EPIPEN 2-PAK) 0.3 mg/0.3 mL IJ SOAJ injection Inject 0.3 mg into the muscle once as needed (for severe allergic reaction).    [provider]  famotidine (PEPCID) 40 MG tablet Take 1 tablet (40 mg total) by mouth 2 (two) times daily. Take with breakfast and dinner meal 08/29/19   Tanda Rockers, MD  gabapentin (NEURONTIN) 100 MG capsule Take 1 capsule (100 mg total) by mouth 3 (three) times daily. One three times daily 07/18/19   Tanda Rockers, MD  guaiFENesin-dextromethorphan [facility] DM) 100-10 MG/5ML syrup Take 5 mLs by mouth every 4 (four) hours as needed for cough.    [provider]  loperamide (IMODIUM A-D) 2 MG tablet Take 2 mg by mouth daily.    [provider]  montelukast (SINGULAIR) 10 MG tablet One at bedtime 08/29/19   Tanda Rockers, MD  norgestrel-ethinyl estradiol (CRYSELLE-28) 0.3-30 MG-MCG tablet Take 1 tablet by mouth daily.    [provider]  predniSONE (DELTASONE) 10 MG tablet Take  4 each am x 2 days,   2 each am x 2 days,  1 each am x 2 days and stop 08/29/19   Tanda Rockers, MD  traZODone (DESYREL) 50 MG tablet TAKE 1 TABLET BY MOUTH EVERYDAY AT BEDTIME 02/27/19   Frann Rider, NP    Allergies    Other, Soy  allergy, Gluten meal, Sumatriptan, Zolmitriptan, Ciprofloxacin, Nsaids, Topiramate, Adhesive [tape], and Triptans  Review of Systems   Review of Systems  All other systems reviewed and are negative.   Physical Exam Updated Vital Signs BP (!) 164/106   Pulse (!) 108   Resp (!) 24   Ht 5' (1.524 m)   Wt 105.3 kg   SpO2 98%   BMI 45.34 kg/m   Physical Exam Vitals and nursing note reviewed.  Constitutional:      Appearance: She is well-developed.  HENT:  Head: Normocephalic and atraumatic.  Cardiovascular:     Rate and Rhythm: Normal rate and regular rhythm.     Heart sounds: No murmur.  Pulmonary:     Effort: Pulmonary effort is normal. No respiratory distress.     Breath sounds: Normal breath sounds.  Abdominal:     Palpations: Abdomen is soft.     Tenderness: There is no abdominal tenderness. There is no guarding or rebound.  Musculoskeletal:        General: No tenderness.  Skin:    General: Skin is warm and dry.  Neurological:     Mental Status: She is alert.     Comments: 3/5 strength in left upper extremity and left lower extremity. Mild left facial droop. Visual fields are grossly intact. Fluent speech.  Psychiatric:        Behavior: Behavior normal.     ED Results / Procedures / Treatments   Labs (all labs ordered are listed, but only abnormal results are displayed) Labs Reviewed  CBC - Abnormal; Notable for the following components:      Result Value   WBC 3.8 (*)    All other components within normal limits  COMPREHENSIVE METABOLIC PANEL - Abnormal; Notable for the following components:   CO2 21 (*)    Glucose, Bld 181 (*)    Calcium 8.7 (*)    Albumin 3.4 (*)    All other components within normal limits  CBG MONITORING, ED - Abnormal; Notable for the following components:   Glucose-Capillary 164 (*)    All other components within normal limits  I-STAT CHEM 8, ED - Abnormal; Notable for the following components:   Glucose, Bld 153 (*)     Calcium, Ion 1.08 (*)    All other components within normal limits  PROTIME-INR  APTT  DIFFERENTIAL  ETHANOL  RAPID URINE DRUG SCREEN, HOSP PERFORMED  URINALYSIS, ROUTINE W REFLEX MICROSCOPIC  CBG MONITORING, ED  I-STAT BETA HCG BLOOD, ED ([facility], [facility], [facility] ONLY)    EKG EKG Interpretation  Date/Time:  Saturday October 11 2019 22:19:44 EST Ventricular Rate:  100 PR Interval:    QRS Duration: 99 QT Interval:  365 QTC Calculation: 471 R Axis:   28 Text Interpretation: Sinus tachycardia Probable left atrial enlargement Confirmed by Tilden Fossaees, [place] Zinni [phone]) on 10/11/2019 10:24:07 PM   Radiology CT Angio Head W/Cm &/Or Wo Cm  Result Date: 10/11/2019 CLINICAL DATA:  Initial evaluation for acute left-sided weakness. EXAM: CT ANGIOGRAPHY HEAD AND NECK TECHNIQUE: Multidetector CT imaging of the head and neck was performed using the standard protocol during bolus administration of intravenous contrast. Multiplanar CT image reconstructions and MIPs were obtained to evaluate the vascular anatomy. Carotid stenosis measurements (when applicable) are obtained utilizing NASCET criteria, using the distal internal carotid diameter as the denominator. CONTRAST:  75mL OMNIPAQUE IOHEXOL 350 MG/ML SOLN COMPARISON:  Prior CT from 07/28/2016. FINDINGS: CT HEAD FINDINGS Brain: Cerebral volume within normal limits for patient age. No evidence for acute intracranial hemorrhage. No findings to suggest acute large vessel territory infarct. No mass lesion, midline shift, or mass effect. Ventricles are normal in size without evidence for hydrocephalus. No extra-axial fluid collection identified. Vascular: No hyperdense vessel identified. Skull: Scalp soft tissues demonstrate no acute abnormality. Calvarium intact. Sinuses/Orbits: Globes and orbital soft tissues within normal limits. Visualized paranasal sinuses are clear. Trace right mastoid effusion, of doubtful significance. Left mastoid air cells clear. CTA NECK FINDINGS  Aortic arch: Visualized aortic arch of normal caliber with normal 3  vessel morphology. No hemodynamically significant stenosis seen about the origin of the great vessels. Visualized subclavian arteries widely patent. Right carotid system: Right common and internal carotid arteries widely patent without stenosis, dissection or occlusion. No atheromatous narrowing about the right carotid bifurcation. Left carotid system: Left common and internal carotid arteries widely patent without stenosis, dissection or occlusion. No atheromatous narrowing about the left carotid bifurcation. Vertebral arteries: Both vertebral arteries arise from the subclavian arteries. Vertebral arteries widely patent within the neck without stenosis, dissection or occlusion. Skeleton: Negative. Other neck: Unremarkable. Upper chest: Few mildly prominent paratracheal lymph nodes measure up to 11 mm, nonspecific, but could be reactive. Patchy multifocal opacity seen within the right greater than left lungs, concerning for multifocal infection/pneumonia. Review of the MIP images confirms the above findings CTA HEAD FINDINGS Anterior circulation: Petrous segments widely patent bilaterally. Cavernous and supraclinoid ICAs widely patent without stenosis. Saccular aneurysm arising from the paraophthalmic right ICA measures 5 x 4 x 5 mm (series 12, image 108). This is projected superiorly. Additional saccular in is a arising from the paraophthalmic left ICA measures 5 x 4 x 5 mm (series 12, image 109). This is also projected superiorly. ICA termini well perfused. A1 segments patent bilaterally. Normal anterior communicating artery. Anterior cerebral arteries widely patent to their distal aspects. No M1 stenosis or occlusion. Normal MCA bifurcations. Distal MCA branches well perfused and symmetric. Posterior circulation: Vertebral arteries widely patent to the vertebrobasilar junction without stenosis. Left PICA patent. Right PICA not visualized. Basilar  widely patent to its distal aspect without stenosis. Superior cerebral arteries patent bilaterally. Both PCAs widely patent to their distal aspects without stenosis. Venous sinuses: Patent. Anatomic variants: None significant. Review of the MIP images confirms the above findings IMPRESSION: 1. Negative CTA of the head and neck. No large vessel occlusion. No high-grade or correctable stenosis. No acute intracranial abnormality. 2. 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 3. Patchy multifocal opacity within the right greater than left lungs, concerning for multifocal infection/pneumonia. Electronically Signed   By: Rise Mu M.D.   On: 10/11/2019 23:40   CT Angio Neck W and/or Wo Contrast  Result Date: 10/11/2019 CLINICAL DATA:  Initial evaluation for acute left-sided weakness. EXAM: CT ANGIOGRAPHY HEAD AND NECK TECHNIQUE: Multidetector CT imaging of the head and neck was performed using the standard protocol during bolus administration of intravenous contrast. Multiplanar CT image reconstructions and MIPs were obtained to evaluate the vascular anatomy. Carotid stenosis measurements (when applicable) are obtained utilizing NASCET criteria, using the distal internal carotid diameter as the denominator. CONTRAST:  73mL OMNIPAQUE IOHEXOL 350 MG/ML SOLN COMPARISON:  Prior CT from 07/28/2016. FINDINGS: CT HEAD FINDINGS Brain: Cerebral volume within normal limits for patient age. No evidence for acute intracranial hemorrhage. No findings to suggest acute large vessel territory infarct. No mass lesion, midline shift, or mass effect. Ventricles are normal in size without evidence for hydrocephalus. No extra-axial fluid collection identified. Vascular: No hyperdense vessel identified. Skull: Scalp soft tissues demonstrate no acute abnormality. Calvarium intact. Sinuses/Orbits: Globes and orbital soft tissues within normal limits. Visualized paranasal sinuses are clear. Trace right mastoid effusion,  of doubtful significance. Left mastoid air cells clear. CTA NECK FINDINGS Aortic arch: Visualized aortic arch of normal caliber with normal 3 vessel morphology. No hemodynamically significant stenosis seen about the origin of the great vessels. Visualized subclavian arteries widely patent. Right carotid system: Right common and internal carotid arteries widely patent without stenosis, dissection or occlusion. No atheromatous  narrowing about the right carotid bifurcation. Left carotid system: Left common and internal carotid arteries widely patent without stenosis, dissection or occlusion. No atheromatous narrowing about the left carotid bifurcation. Vertebral arteries: Both vertebral arteries arise from the subclavian arteries. Vertebral arteries widely patent within the neck without stenosis, dissection or occlusion. Skeleton: Negative. Other neck: Unremarkable. Upper chest: Few mildly prominent paratracheal lymph nodes measure up to 11 mm, nonspecific, but could be reactive. Patchy multifocal opacity seen within the right greater than left lungs, concerning for multifocal infection/pneumonia. Review of the MIP images confirms the above findings CTA HEAD FINDINGS Anterior circulation: Petrous segments widely patent bilaterally. Cavernous and supraclinoid ICAs widely patent without stenosis. Saccular aneurysm arising from the paraophthalmic right ICA measures 5 x 4 x 5 mm (series 12, image 108). This is projected superiorly. Additional saccular in is a arising from the paraophthalmic left ICA measures 5 x 4 x 5 mm (series 12, image 109). This is also projected superiorly. ICA termini well perfused. A1 segments patent bilaterally. Normal anterior communicating artery. Anterior cerebral arteries widely patent to their distal aspects. No M1 stenosis or occlusion. Normal MCA bifurcations. Distal MCA branches well perfused and symmetric. Posterior circulation: Vertebral arteries widely patent to the vertebrobasilar  junction without stenosis. Left PICA patent. Right PICA not visualized. Basilar widely patent to its distal aspect without stenosis. Superior cerebral arteries patent bilaterally. Both PCAs widely patent to their distal aspects without stenosis. Venous sinuses: Patent. Anatomic variants: None significant. Review of the MIP images confirms the above findings IMPRESSION: 1. Negative CTA of the head and neck. No large vessel occlusion. No high-grade or correctable stenosis. No acute intracranial abnormality. 2. 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 3. Patchy multifocal opacity within the right greater than left lungs, concerning for multifocal infection/pneumonia. Electronically Signed   By: Rise Mu M.D.   On: 10/11/2019 23:40    Procedures Procedures (including critical care time)  Medications Ordered in ED Medications  sodium chloride flush (NS) 0.9 % injection 3 mL (3 mLs Intravenous Not Given 10/11/19 2201)  iohexol (OMNIPAQUE) 350 MG/ML injection 75 mL (75 mLs Intravenous Contrast Given 10/11/19 2228)  sodium chloride 0.9 % bolus 1,000 mL (1,000 mLs Intravenous New Bag/Given 10/12/19 0040)  metoCLOPramide (REGLAN) injection 10 mg (10 mg Intravenous Given 10/12/19 0040)  LORazepam (ATIVAN) injection 1 mg (1 mg Intravenous Not Given 10/12/19 0109)  diphenhydrAMINE (BENADRYL) injection 25 mg (25 mg Intravenous Given 10/12/19 0112)    ED Course  I have reviewed the triage vital signs and the nursing notes.  Pertinent labs & imaging results that were available during my care of the patient were reviewed by me and considered in my medical decision making (see chart for details).    MDM Rules/Calculators/A&P                     Patient here for evaluation of left sided weakness. She is not a TPA candidate due to duration of symptoms. She does have weakness on examination on the left upper, left lower extremity as well as left face. CTA is negative for acute stroke but does  demonstrate bilateral IC aneurysms that appear to be incidental. On repeat assessment she is complaining of headache that developed during her ED stay. She was treated with Reglan, fluids. Discussed with neurologist, who recommends MRI to further evaluate. After receiving Reglan patient became tachypnea consent that she felt very hot. She appears to be anxious, writhing on the  stretcher moving all four extremities. She was treated with Ativan, Benadryl for akathisia. Presentation is not consistent with anaphylaxis. Patient care transferred pending MRI.  Final Clinical Impression(s) / ED Diagnoses Final diagnoses:  Bad headache  Left-sided weakness  Intracranial aneurysm    Rx / DC Orders ED Discharge Orders    None       Tilden Fossaees, Araceli Arango, MD 10/12/19 0127

## 2019-10-12 ENCOUNTER — Emergency Department (HOSPITAL_COMMUNITY): Payer: BC Managed Care – PPO

## 2019-10-12 LAB — URINALYSIS, ROUTINE W REFLEX MICROSCOPIC
Bacteria, UA: NONE SEEN
Bilirubin Urine: NEGATIVE
Glucose, UA: NEGATIVE mg/dL
Hgb urine dipstick: NEGATIVE
Ketones, ur: NEGATIVE mg/dL
Leukocytes,Ua: NEGATIVE
Nitrite: NEGATIVE
Protein, ur: NEGATIVE mg/dL
Specific Gravity, Urine: >1.046 — ABNORMAL HIGH (ref 1.005–1.030)
pH: 7 (ref 5.0–8.0)

## 2019-10-12 LAB — RAPID URINE DRUG SCREEN, HOSP PERFORMED
Amphetamines: NOT DETECTED
Barbiturates: NOT DETECTED
Benzodiazepines: NOT DETECTED
Cocaine: NOT DETECTED
Opiates: POSITIVE — AB
Tetrahydrocannabinol: NOT DETECTED

## 2019-10-12 MED ORDER — LORAZEPAM 2 MG/ML IJ SOLN
1.0000 mg | Freq: Once | INTRAMUSCULAR | Status: AC
  Administered 2019-10-12: 1 mg via INTRAVENOUS

## 2019-10-12 MED ORDER — DIPHENHYDRAMINE HCL 50 MG/ML IJ SOLN
25.0000 mg | Freq: Once | INTRAMUSCULAR | Status: AC
  Administered 2019-10-12: 25 mg via INTRAVENOUS
  Filled 2019-10-12: qty 1

## 2019-10-12 MED ORDER — SODIUM CHLORIDE 0.9 % IV BOLUS
1000.0000 mL | Freq: Once | INTRAVENOUS | Status: AC
  Administered 2019-10-12: 1000 mL via INTRAVENOUS

## 2019-10-12 MED ORDER — LORAZEPAM 2 MG/ML IJ SOLN
INTRAMUSCULAR | Status: AC
  Filled 2019-10-12: qty 1

## 2019-10-12 MED ORDER — METOCLOPRAMIDE HCL 5 MG/ML IJ SOLN
10.0000 mg | Freq: Once | INTRAMUSCULAR | Status: AC
  Administered 2019-10-12: 10 mg via INTRAVENOUS
  Filled 2019-10-12: qty 2

## 2019-10-12 NOTE — Discharge Instructions (Addendum)
°  SEEK MEDICAL ATTENTION IF: ° °You develop possible problems with medications prescribed.  °The medications don't resolve your headache, if it recurs , or if you have multiple episodes of vomiting or can't take fluids. °You have a change from the usual headache. ° °RETURN IMMEDIATELY IF you develop a sudden, severe headache or confusion, become poorly responsive or faint, develop a fever above 100.4F or problem breathing, have a change in speech, vision, swallowing, or understanding, or develop new weakness, numbness, tingling, incoordination, or have a seizure. ° °

## 2019-10-12 NOTE — ED Provider Notes (Signed)
Plan at signout is to f/u on MRI If negative, likely discharge home    Zadie Rhine, MD 10/12/19 [phone]

## 2019-10-12 NOTE — ED Notes (Signed)
Discharge instructions discussed with pt. Pt verbalized understanding with no questions at this time.  

## 2019-10-12 NOTE — ED Notes (Signed)
Pt anxious HR elevated stating she cant breath. Stating that the room is hot.   HR 156 RR 28 100% RA

## 2019-10-12 NOTE — ED Provider Notes (Signed)
MRI negative. Patient feels improved.  She reports she can ambulate.  She still has headache but overall improved.  She will be discharged home.  Follow-up with neurology for her headaches.  She has also been referred to interventional radiology for the incidental aneurysms that were found.  Patient agreeable with plan   Zadie Rhine, MD 10/12/19 [phone]

## 2019-10-12 NOTE — ED Notes (Signed)
Patient transported to MRI 

## 2019-10-12 NOTE — ED Notes (Signed)
Pt calmer. Resting and comfortable

## 2019-10-13 ENCOUNTER — Telehealth: Payer: Self-pay | Admitting: Gastroenterology

## 2019-10-13 ENCOUNTER — Other Ambulatory Visit (HOSPITAL_COMMUNITY): Payer: Self-pay | Admitting: Interventional Radiology

## 2019-10-13 ENCOUNTER — Telehealth (HOSPITAL_COMMUNITY): Payer: Self-pay

## 2019-10-13 DIAGNOSIS — I671 Cerebral aneurysm, nonruptured: Secondary | ICD-10-CM

## 2019-10-13 NOTE — Telephone Encounter (Signed)
Called to schedule consult, no answer, left vm. AW  

## 2019-10-13 NOTE — Telephone Encounter (Signed)
Do you have a preference when any virtual's are put on your schedule?  Afternoon or morning.  Last appt of the day, etc?

## 2019-10-13 NOTE — Telephone Encounter (Signed)
Pt returned call. She stated that she can't come in at this time because she is COVID+. She will call back to reschedule once she is cleared by her physician. AW

## 2019-10-13 NOTE — Telephone Encounter (Signed)
Dr Jacobs please advise  

## 2019-10-13 NOTE — Telephone Encounter (Signed)
That is OK with me.  I prefer Zoom.  Thanks

## 2019-10-13 NOTE — Telephone Encounter (Signed)
No preference.  Just let them know to make sure they are near their phone around the time of appt.  Will text them a zoom link and go from there.  Thanks

## 2019-10-14 NOTE — Telephone Encounter (Signed)
The pt has been advised that the appt will be a zoom appt and she will need to be near her phone at that time.

## 2019-10-17 ENCOUNTER — Encounter: Payer: Self-pay | Admitting: Gastroenterology

## 2019-10-17 ENCOUNTER — Ambulatory Visit (INDEPENDENT_AMBULATORY_CARE_PROVIDER_SITE_OTHER): Payer: BC Managed Care – PPO | Admitting: Gastroenterology

## 2019-10-17 DIAGNOSIS — K2100 Gastro-esophageal reflux disease with esophagitis, without bleeding: Secondary | ICD-10-CM | POA: Diagnosis not present

## 2019-10-17 MED ORDER — FAMOTIDINE 20 MG PO TABS: ORAL_TABLET | ORAL | 6 refills | Status: AC

## 2019-10-17 NOTE — Patient Instructions (Addendum)
If you are age 34 or older, your body mass index should be between 23-30. Your There is no height or weight on file to calculate BMI. If this is out of the aforementioned range listed, please consider follow up with your Primary Care Provider.  If you are age 25 or younger, your body mass index should be between 19-25. Your There is no height or weight on file to calculate BMI. If this is out of the aformentioned range listed, please consider follow up with your Primary Care Provider.  We have sent the following medications to your pharmacy for you to pick up at your convenience:   START: famotidine 20 mg take 1 pill every morning after waking and 1 pill at bedtime every night.     Please call our office in 4 or 5 weeks to let us know how you responded to this treatment.  Thank you, Dr Christella Hartigan

## 2019-10-17 NOTE — Progress Notes (Signed)
This service was provided via virtual visit.  Both audio and visual were used. .  The patient was located at home.  I was located in my office.  The patient did consent to this virtual visit and is aware of possible charges through their insurance for this visit.  The patient is a estblished patient.  My certified medical assistantcontributed to this visit by contacting the patient by phone 1 or 2 business days prior to the appointment and also followed up on the recommendations I made after the visit.  Time spent on virtual visit: 20 minutes   HPI: This is a very pleasant 34 year old woman whom I last saw the time of upper endoscopy.  Seals results summarized below.  EGD October 2020 found LA grade B reflux related esophagitis.  The examination was otherwise normal.  She has a lot of bloating with proton pump inhibitors and so we prescribed famotidine 40 mg twice daily.  Today she tells me that she has been taking Protonix and Pepcid together.  She has never taken them apart from each other.  She is not sure which one was causing bloating and indigestion.  Was tested positive for Covid last week, this was been causing headaches and a cough that is improving.  No dysphagia.  Chief complaint is GERD  ROS: complete GI ROS as described in HPI, all other review negative.  Constitutional:  No unintentional weight loss   Past Medical History:  Diagnosis Date  . Anemia    takes Ferrous Sulfate daily  . Anxiety    takes Klonopin nightly  . ASTHMA NOS W/ACUTE EXACERBATION 01/05/2010   exercise induced  . Back pain   . Brain aneurysm   . Coarse tremors   . Degenerative disc disease    back  . Gallstones   . GERD (gastroesophageal reflux disease)    but doesn't take anything for it  . History of kidney stones   . History of migraine    last one a couple of days ago  . History of UTI    frequent  . IBS (irritable bowel syndrome)    doesn't take any meds  . Medullary sponge kidney  2007   kidneys can be sick but she isnt sick  . MIGRAINES, HX OF 06/24/2007  . Narcolepsy   . 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 OCD (obsessive compulsive disorder)    takes Prozac daily  . PYELONEPHRITIS 06/24/2007  . Seizures ([place])    takes Lamictal daily;last seizure was in 11/14/10  . Urinary frequency     Past Surgical History:  Procedure Laterality Date  . BACK SURGERY    . CHOLECYSTECTOMY N/A 12/02/2013   Procedure: LAPAROSCOPIC CHOLECYSTECTOMY WITH ATTEMPTED INTRAOPERATIVE CHOLANGIOGRAM;  Surgeon: Gayland Curry, MD;  Location: [facility];  Service: General;  Laterality: N/A;  . LUMBAR LAMINECTOMY/DECOMPRESSION MICRODISCECTOMY  07/24/2012   Procedure: LUMBAR LAMINECTOMY/DECOMPRESSION MICRODISCECTOMY;  Surgeon: Tobi Bastos, MD;  Location: [facility];  Service: Orthopedics;  Laterality: Right;  L5-S1  . WISDOM TOOTH EXTRACTION      Current Outpatient Medications  Medication Sig Dispense Refill  . albuterol (VENTOLIN HFA) 108 (90 Base) MCG/ACT inhaler Inhale 2 puffs into the lungs every 4 (four) hours as needed.    . chlorpheniramine-HYDROcodone (TUSSIONEX) 10-8 MG/5ML SUER SMARTSIG:5 Milliliter(s) By Mouth Every 12 Hours PRN    . EPINEPHrine (EPIPEN 2-PAK) 0.3 mg/0.3 mL IJ SOAJ injection Inject 0.3 mg into the muscle once as needed (for severe allergic reaction).    Marland Kitchen  guaiFENesin-dextromethorphan (ROBITUSSIN DM) 100-10 MG/5ML syrup Take 5 mLs by mouth every 4 (four) hours as needed for cough.    . montelukast (SINGULAIR) 10 MG tablet One at bedtime    . norgestrel-ethinyl estradiol (CRYSELLE-28) 0.3-30 MG-MCG tablet Take 1 tablet by mouth daily.    . predniSONE (DELTASONE) 10 MG tablet Take  4 each am x 2 days,   2 each am x 2 days,  1 each am x 2 days and stop 14 tablet 0  . traZODone (DESYREL) 50 MG tablet TAKE 1 TABLET BY MOUTH EVERYDAY AT BEDTIME 30 tablet 5   No current facility-administered medications for this visit.    Allergies as of 10/17/2019 - Review Complete 10/17/2019  Allergen  Reaction Noted  . Other Anaphylaxis and Other (See Comments) 09/01/2011  . Soy allergy Anaphylaxis 08/04/2014  . Gluten meal Diarrhea and Rash 11/23/2017  . Sumatriptan Rash 06/05/2015  . Zolmitriptan Rash   . Ciprofloxacin Other (See Comments)   . Nsaids Other (See Comments) 11/14/2010  . Topiramate Other (See Comments)   . Adhesive [tape] Itching and Rash 11/19/2013  . Triptans Rash 07/23/2012    Family History  Problem Relation Age of Onset  . Heart disease Mother   . ADD / ADHD Sister   . Lung cancer Maternal Grandmother        smoked  . Anesthesia problems Neg Hx     Social History   Socioeconomic History  . Marital status: Married    Spouse name: Not on file  . Number of children: 2  . Years of education: 42  . Highest education level: Not on file  Occupational History  . 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 Smoking status: Never Smoker  . Smokeless tobacco: Never Used  Substance and Sexual Activity  . Alcohol use: No    Alcohol/week: 0.0 standard drinks    Comment: occas. when not pregnant  . Drug use: No  . Sexual activity: Yes    Partners: Male    Comment: last sex  Aug 22 2014  Other Topics Concern  . Not on file  Social History Narrative   HSG, finished 2 years of college. single mom with 2 sons blake - Aug '09, Elijah Dec '12. 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 Still lives with father of her sons with wedding plans on hold ( Jan '13)   Social Determinants of Health   Financial Resource Strain:   . Difficulty of Paying Living Expenses: Not on file  Food Insecurity:   . Worried About Programme researcher, broadcasting/film/video in the Last Year: Not on file  . Ran Out of Food in the Last Year: Not on file  Transportation Needs:   . Lack of Transportation (Medical): Not on file  . Lack of Transportation (Non-Medical): Not on file  Physical Activity:   . Days of  Exercise per Week: Not on file  . Minutes of Exercise per Session: Not on file  Stress:   . Feeling of Stress : Not on file  Social Connections:   . Frequency of Communication with Friends and Family: Not on file  . Frequency of Social Gatherings with Friends and Family: Not on file  . Attends Religious Services: Not on file  . Active Member of Clubs or Organizations: Not on file  . Attends Banker Meetings: Not  on file  . Marital Status: Not on file  Intimate Partner Violence:   . Fear of Current or Ex-Partner: Not on file  . Emotionally Abused: Not on file  . Physically Abused: Not on file  . Sexually Abused: Not on file     Physical Exam: Unable to perform because this was a "telemed visit" due to current Covid-19 pandemic  Assessment and plan: 34 y.o. female with GERD symptoms, esophagitis on EGD October 2020  I am not sure if she was intolerant to proton pump inhibitor or H2 blockers.  She always took them at the same time.  Most likely it is proton pump inhibitors and so I advised her to start taking H2 blocker Pepcid 20 mg pills shortly after waking and also just before bedtime.  Hopefully this will help her pyrosis, heartburn, dyspepsia.  She will call to report on her response in 4 weeks.  Please see the "Patient Instructions" section for addition details about the plan.  Rob Bunting, MD [facility] 10/17/2019, 11:17 AM

## 2019-11-07 ENCOUNTER — Other Ambulatory Visit: Payer: Self-pay | Admitting: Neurosurgery

## 2019-11-07 DIAGNOSIS — I671 Cerebral aneurysm, nonruptured: Secondary | ICD-10-CM

## 2019-11-10 ENCOUNTER — Other Ambulatory Visit: Payer: Self-pay | Admitting: Neurosurgery

## 2019-11-26 ENCOUNTER — Other Ambulatory Visit (HOSPITAL_COMMUNITY): Payer: BC Managed Care – PPO

## 2019-11-28 ENCOUNTER — Inpatient Hospital Stay (HOSPITAL_COMMUNITY): Admission: RE | Admit: 2019-11-28 | Payer: BC Managed Care – PPO | Source: Ambulatory Visit

## 2019-12-02 ENCOUNTER — Other Ambulatory Visit (HOSPITAL_COMMUNITY): Payer: BC Managed Care – PPO

## 2019-12-02 ENCOUNTER — Other Ambulatory Visit (HOSPITAL_COMMUNITY)
Admission: RE | Admit: 2019-12-02 | Discharge: 2019-12-02 | Disposition: A | Discharge status: Home / Self Care | Payer: BC Managed Care – PPO | Source: Ambulatory Visit | Attending: Neurosurgery | Admitting: Neurosurgery

## 2019-12-02 DIAGNOSIS — Z01812 Encounter for preprocedural laboratory examination: Secondary | ICD-10-CM | POA: Insufficient documentation

## 2019-12-02 DIAGNOSIS — Z20822 Contact with and (suspected) exposure to covid-19: Secondary | ICD-10-CM | POA: Insufficient documentation

## 2019-12-03 LAB — SARS CORONAVIRUS 2 (TAT 6-24 HRS): SARS Coronavirus 2: NEGATIVE

## 2019-12-04 ENCOUNTER — Telehealth: Payer: Self-pay | Admitting: Internal Medicine

## 2019-12-04 NOTE — Telephone Encounter (Signed)
Discussed case, noted she has not returned and sent message she will need to do so if she wants me to support any element of her disability claims going forward with anything but the records we already have on file which she and her insurance company have access to

## 2019-12-04 NOTE — Telephone Encounter (Addendum)
Routing to Dr. Sherene Sires so he can contact Dr. Elige Radon about pt.

## 2019-12-04 NOTE — H&P (Signed)
Chief Complaint   aneurysm  HPI   HPI: [name] is a 34 y.o. femaleWho was found to have bilateral paraophthalmic internal carotid artery aneurysm during workup for right-sided headache with associated left-sided paresthesias.  She presents today for diagnostic cerebral angiogram for further characterization of said aneurysms.  She is without any concerns.    Of note she does not have a history of hypertension, no known family history of intracranial aneurysms and is a nonsmoker.  Patient Active Problem List   Diagnosis Date Noted  . GERD (gastroesophageal reflux disease) 05/16/2019  . Mild intermittent asthma vs UACS/cyclical coughing  17/40/8144  . Abnormal CT of the chest 02/08/2019  . DOE (dyspnea on exertion) 02/08/2019  . Class 3 severe obesity with body mass index (BMI) of 40.0 to 44.9 in adult ([place]) 08/13/2017  . 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 Hypersomnia, persistent 08/13/2017  . 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 S/P laparoscopic cholecystectomy 12/02/2013  . 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  . H/O abnormal cervical Papanicolaou smear 09/29/2013  . OCD (obsessive compulsive disorder)   . Convulsions/seizures ([place]) 04/21/2013  . Anxiety 04/21/2013  . 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 Chronic cough 10/17/2011  . 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 ASTHMA NOS W/ACUTE EXACERBATION 01/05/2010  . ABSENCE OF MENSTRUATION 10/27/2009  . MIGRAINES, HX OF 06/24/2007    PMH: Past Medical History:  Diagnosis Date  . Anemia    takes Ferrous Sulfate daily  . Anxiety    takes Klonopin nightly  . ASTHMA NOS W/ACUTE EXACERBATION 01/05/2010   exercise induced  . Back pain   . Brain aneurysm   . Coarse tremors   . Degenerative disc disease    back  . Gallstones   . GERD (gastroesophageal reflux disease)    but doesn't take anything for it  . History of kidney stones   . History of  migraine    last one a couple of days ago  . History of UTI    frequent  . IBS (irritable bowel syndrome)    doesn't take any meds  . Medullary sponge kidney 2007   kidneys can be sick but she isnt sick  . MIGRAINES, HX OF 06/24/2007  . Narcolepsy   . 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 OCD (obsessive compulsive disorder)    takes Prozac daily  . PYELONEPHRITIS 06/24/2007  . Seizures ([place])    takes Lamictal daily;last seizure was in 11/14/10  . Urinary frequency     PSH: Past Surgical History:  Procedure Laterality Date  . BACK SURGERY    . CHOLECYSTECTOMY N/A 12/02/2013   Procedure: LAPAROSCOPIC CHOLECYSTECTOMY WITH ATTEMPTED INTRAOPERATIVE CHOLANGIOGRAM;  Surgeon: Gayland Curry, MD;  Location: [facility];  Service: General;  Laterality: N/A;  . LUMBAR LAMINECTOMY/DECOMPRESSION MICRODISCECTOMY  07/24/2012   Procedure: LUMBAR LAMINECTOMY/DECOMPRESSION MICRODISCECTOMY;  Surgeon: Tobi Bastos, MD;  [facility];  Service: Orthopedics;  Laterality: Right;  L5-S1  . WISDOM TOOTH EXTRACTION      (Not in a hospital admission)   SH: Social History   Tobacco Use  . Smoking status: Never Smoker  . Smokeless tobacco: Never Used  Substance Use Topics  . Alcohol use: No    Alcohol/week: 0.0 standard drinks    Comment: occas. when not pregnant  . Drug use: No    MEDS: Prior to Admission medications   Medication Sig Start Date End Date Taking? Authorizing Provider  albuterol (VENTOLIN HFA) 108 (90 Base)  MCG/ACT inhaler Inhale 2 puffs into the lungs every 4 (four) hours as needed. 03/17/19   [provider]  chlorpheniramine-HYDROcodone (TUSSIONEX) 10-8 MG/5ML SUER SMARTSIG:5 Milliliter(s) By Mouth Every 12 Hours PRN 10/10/19   [provider]  EPINEPHrine (EPIPEN 2-PAK) 0.3 mg/0.3 mL IJ SOAJ injection Inject 0.3 mg into the muscle once as needed (for severe allergic reaction).    [provider]  famotidine (PEPCID) 20 MG tablet 1 pill every morning after waking and  1 pill at bedtime every night. 10/17/19   Rachael Fee, MD  guaiFENesin-dextromethorphan [facility] DM) 100-10 MG/5ML syrup Take 5 mLs by mouth every 4 (four) hours as needed for cough.    [provider]  montelukast (SINGULAIR) 10 MG tablet One at bedtime 08/29/19   Nyoka Cowden, MD  norgestrel-ethinyl estradiol (CRYSELLE-28) 0.3-30 MG-MCG tablet Take 1 tablet by mouth daily.    [provider]  predniSONE (DELTASONE) 10 MG tablet Take  4 each am x 2 days,   2 each am x 2 days,  1 each am x 2 days and stop 08/29/19   Nyoka Cowden, MD  traZODone (DESYREL) 50 MG tablet TAKE 1 TABLET BY MOUTH EVERYDAY AT BEDTIME 02/27/19   Ihor Austin, NP    ALLERGY: Allergies  Allergen Reactions  . Other Anaphylaxis and Other (See Comments)    Pt is allergic to balsamic vinaigrette.    Pt is allergic to balsamic vinaigrette.   balsalmic vingerette  . Soy Allergy Anaphylaxis  . Gluten Meal Diarrhea and Rash    Bloating   . Sumatriptan Rash  . Zolmitriptan Rash  . Ciprofloxacin Other (See Comments)    Reaction:  Burning of pts arm  . Nsaids Other (See Comments)    Pt states that she was told to avoid this class of medication.    . Topiramate Other (See Comments)    Reaction:  Unknown   . Adhesive [Tape] Itching and Rash  . Triptans Rash    Social History   Tobacco Use  . Smoking status: Never Smoker  . Smokeless tobacco: Never Used  Substance Use Topics  . Alcohol use: No    Alcohol/week: 0.0 standard drinks    Comment: occas. when not pregnant     Family History  Problem Relation Age of Onset  . Heart disease Mother   . ADD / ADHD Sister   . Lung cancer Maternal Grandmother        smoked  . Anesthesia problems Neg Hx      ROS   ROS  Exam   There were no vitals filed for this visit. General appearance: WDWN, NAD Eyes: No scleral injection Cardiovascular: Regular rate and rhythm without murmurs, rubs, gallops. No edema or variciosities. Distal pulses  normal. Pulmonary: Effort normal, non-labored breathing Musculoskeletal:     Muscle tone upper extremities: Normal    Muscle tone lower extremities: Normal    Motor exam: Upper Extremities Deltoid Bicep Tricep Grip  Right 5/5 5/5 5/5 5/5  Left 5/5 5/5 5/5 5/5   Lower Extremity IP Quad PF DF EHL  Right 5/5 5/5 5/5 5/5 5/5  Left 5/5 5/5 5/5 5/5 5/5   Neurological Mental Status:    - Patient is awake, alert, oriented to person, place, month, year, and situation    - Patient is able to give a clear and coherent history.    - No signs of aphasia or neglect Cranial Nerves    - II: Visual Fields are  full. PERRL    - III/IV/VI: EOMI without ptosis or diploplia.     - V: Facial sensation is grossly normal    - VII: Facial movement is symmetric.     - VIII: hearing is intact to voice    - X: Uvula elevates symmetrically    - XI: Shoulder shrug is symmetric.    - XII: tongue is midline without atrophy or fasciculations.  Sensory: Sensation grossly intact to LT  Results - Imaging/Labs   Results for orders placed or performed during the hospital encounter of 12/02/19 (from the past 48 hour(s))  SARS CORONAVIRUS 2 (TAT 6-24 HRS) Nasopharyngeal Nasopharyngeal Swab     Status: None   Collection Time: 12/02/19  2:58 PM   Specimen: Nasopharyngeal Swab  Result Value Ref Range   SARS Coronavirus 2 NEGATIVE NEGATIVE    Comment: (NOTE) SARS-CoV-2 target nucleic acids are NOT DETECTED. The SARS-CoV-2 RNA is generally detectable in upper and lower respiratory specimens during the acute phase of infection. Negative results do not preclude SARS-CoV-2 infection, do not rule out co-infections with other pathogens, and should not be used as the sole basis for treatment or other patient management decisions. Negative results must be combined with clinical observations, patient history, and epidemiological information. The expected result is Negative. Fact Sheet for  Patients: HairSlick.no Fact Sheet for Healthcare Providers: quierodirigir.com This test is not yet approved or cleared by the [place] FDA and  has been authorized for detection and/or diagnosis of SARS-CoV-2 by FDA under an Emergency Use Authorization (EUA). This EUA will remain  in effect (meaning this test can be used) for the duration of the COVID-19 declaration under Section 56 4(b)(1) of the Act, 21 U.S.C. section 360bbb-3(b)(1), unless the authorization is terminated or revoked sooner. Performed at [facility], [address], [place], [place] [address]     No results found.  IMAGING: CT angiogram of the head dated 10/11/2019 was personally reviewed.  This demonstrates the presence of approximately 4-5 mm bilateral paraophthalmic internal carotid artery aneurysms.  Both aneurysms projects superiorly.  Impression/Plan   34 y.o. female  With incidentally discovered bilateral paraophthalmic internal carotid artery aneurysms during workup for headache.  We will proceed with diagnostic cerebral angiogram for further characterization with intention to treat.    Risks, benefits and alternatives were discussed.  Patient stated understanding and wished to proceed.  Cindra Presume, PA-C [facility]

## 2019-12-05 ENCOUNTER — Telehealth: Payer: Self-pay | Admitting: *Deleted

## 2019-12-05 ENCOUNTER — Other Ambulatory Visit: Payer: Self-pay | Admitting: Neurosurgery

## 2019-12-05 ENCOUNTER — Other Ambulatory Visit: Payer: Self-pay

## 2019-12-05 ENCOUNTER — Ambulatory Visit (HOSPITAL_COMMUNITY)
Admission: RE | Admit: 2019-12-05 | Discharge: 2019-12-05 | Disposition: A | Discharge status: Home / Self Care | Payer: BC Managed Care – PPO | Source: Ambulatory Visit | Attending: Neurosurgery | Admitting: Neurosurgery

## 2019-12-05 ENCOUNTER — Encounter (HOSPITAL_COMMUNITY): Payer: Self-pay

## 2019-12-05 DIAGNOSIS — F429 Obsessive-compulsive disorder, unspecified: Secondary | ICD-10-CM | POA: Diagnosis not present

## 2019-12-05 DIAGNOSIS — I671 Cerebral aneurysm, nonruptured: Secondary | ICD-10-CM | POA: Diagnosis not present

## 2019-12-05 DIAGNOSIS — D649 Anemia, unspecified: Secondary | ICD-10-CM | POA: Insufficient documentation

## 2019-12-05 DIAGNOSIS — Z6841 Body Mass Index (BMI) 40.0 and over, adult: Secondary | ICD-10-CM | POA: Insufficient documentation

## 2019-12-05 DIAGNOSIS — G47419 Narcolepsy without cataplexy: Secondary | ICD-10-CM | POA: Diagnosis not present

## 2019-12-05 DIAGNOSIS — K219 Gastro-esophageal reflux disease without esophagitis: Secondary | ICD-10-CM | POA: Insufficient documentation

## 2019-12-05 DIAGNOSIS — Z886 Allergy status to analgesic agent status: Secondary | ICD-10-CM | POA: Diagnosis not present

## 2019-12-05 DIAGNOSIS — R569 Unspecified convulsions: Secondary | ICD-10-CM | POA: Diagnosis not present

## 2019-12-05 DIAGNOSIS — Z79899 Other long term (current) drug therapy: Secondary | ICD-10-CM | POA: Insufficient documentation

## 2019-12-05 DIAGNOSIS — Z881 Allergy status to other antibiotic agents status: Secondary | ICD-10-CM | POA: Diagnosis not present

## 2019-12-05 DIAGNOSIS — F329 Major depressive disorder, single episode, unspecified: Secondary | ICD-10-CM | POA: Diagnosis not present

## 2019-12-05 DIAGNOSIS — F419 Anxiety disorder, unspecified: Secondary | ICD-10-CM | POA: Insufficient documentation

## 2019-12-05 DIAGNOSIS — E669 Obesity, unspecified: Secondary | ICD-10-CM | POA: Diagnosis not present

## 2019-12-05 HISTORY — PX: IR ANGIO INTRA EXTRACRAN SEL INTERNAL CAROTID BILAT MOD SED: IMG5363

## 2019-12-05 HISTORY — PX: IR US GUIDE VASC ACCESS RIGHT: IMG2390

## 2019-12-05 LAB — CBC WITH DIFFERENTIAL/PLATELET
Abs Immature Granulocytes: 0.01 K/uL (ref 0.00–0.07)
Basophils Absolute: 0.1 K/uL (ref 0.0–0.1)
Basophils Relative: 1 %
Eosinophils Absolute: 0.1 K/uL (ref 0.0–0.5)
Eosinophils Relative: 2 %
HCT: 38.9 % (ref 36.0–46.0)
Hemoglobin: 12.7 g/dL (ref 12.0–15.0)
Immature Granulocytes: 0 %
Lymphocytes Relative: 22 %
Lymphs Abs: 1.3 K/uL (ref 0.7–4.0)
MCH: 29.1 pg (ref 26.0–34.0)
MCHC: 32.6 g/dL (ref 30.0–36.0)
MCV: 89.2 fL (ref 80.0–100.0)
Monocytes Absolute: 0.3 K/uL (ref 0.1–1.0)
Monocytes Relative: 6 %
Neutro Abs: 4 K/uL (ref 1.7–7.7)
Neutrophils Relative %: 69 %
Platelets: 313 K/uL (ref 150–400)
RBC: 4.36 MIL/uL (ref 3.87–5.11)
RDW: 12.7 % (ref 11.5–15.5)
WBC: 5.8 K/uL (ref 4.0–10.5)
nRBC: 0 % (ref 0.0–0.2)

## 2019-12-05 LAB — URINALYSIS, ROUTINE W REFLEX MICROSCOPIC
Bilirubin Urine: NEGATIVE
Glucose, UA: NEGATIVE mg/dL
Hgb urine dipstick: NEGATIVE
Ketones, ur: NEGATIVE mg/dL
Nitrite: NEGATIVE
Protein, ur: NEGATIVE mg/dL
Specific Gravity, Urine: 1.018 (ref 1.005–1.030)
pH: 5 (ref 5.0–8.0)

## 2019-12-05 LAB — BASIC METABOLIC PANEL
Anion gap: 9 (ref 5–15)
BUN: 11 mg/dL (ref 6–20)
CO2: 22 mmol/L (ref 22–32)
Calcium: 8.9 mg/dL (ref 8.9–10.3)
Chloride: 105 mmol/L (ref 98–111)
Creatinine, Ser: 0.75 mg/dL (ref 0.44–1.00)
GFR calc Af Amer: >60 mL/min (ref >60)
GFR calc non Af Amer: >60 mL/min (ref >60)
Glucose, Bld: 100 mg/dL — ABNORMAL HIGH (ref 70–99)
Potassium: 4.2 mmol/L (ref 3.5–5.1)
Sodium: 136 mmol/L (ref 135–145)

## 2019-12-05 LAB — PROTIME-INR
INR: 1 (ref 0.8–1.2)
Prothrombin Time: 13.1 s (ref 11.4–15.2)

## 2019-12-05 LAB — APTT: aPTT: 27 seconds (ref 24–36)

## 2019-12-05 LAB — PREGNANCY, URINE: Preg Test, Ur: NEGATIVE

## 2019-12-05 MED ORDER — HYDROCODONE-ACETAMINOPHEN 5-325 MG PO TABS: 1.0000 | ORAL_TABLET | ORAL | Status: DC | PRN

## 2019-12-05 MED ORDER — IOHEXOL 300 MG/ML  SOLN
150.0000 mL | Freq: Once | INTRAMUSCULAR | Status: AC | PRN
  Administered 2019-12-05: 24 mL via INTRA_ARTERIAL

## 2019-12-05 MED ORDER — VERAPAMIL HCL 2.5 MG/ML IV SOLN
INTRAVENOUS | Status: AC
  Administered 2019-12-05: 2.5 mg
  Filled 2019-12-05: qty 2

## 2019-12-05 MED ORDER — LIDOCAINE HCL (PF) 1 % IJ SOLN
INTRAMUSCULAR | Status: AC | PRN
  Administered 2019-12-05: 1 mL

## 2019-12-05 MED ORDER — CHLORHEXIDINE GLUCONATE CLOTH 2 % EX PADS: 6.0000 | MEDICATED_PAD | Freq: Once | CUTANEOUS | Status: DC

## 2019-12-05 MED ORDER — HEPARIN SODIUM (PORCINE) 1000 UNIT/ML IJ SOLN
INTRAMUSCULAR | Status: AC | PRN
  Administered 2019-12-05: 3000 [IU] via INTRAVENOUS

## 2019-12-05 MED ORDER — FENTANYL CITRATE (PF) 100 MCG/2ML IJ SOLN
INTRAMUSCULAR | Status: AC
  Filled 2019-12-05: qty 2

## 2019-12-05 MED ORDER — MIDAZOLAM HCL 2 MG/2ML IJ SOLN
INTRAMUSCULAR | Status: AC
  Filled 2019-12-05: qty 2

## 2019-12-05 MED ORDER — LIDOCAINE HCL 1 % IJ SOLN
INTRAMUSCULAR | Status: AC
  Filled 2019-12-05: qty 20

## 2019-12-05 MED ORDER — CEFAZOLIN SODIUM-DEXTROSE 2-4 GM/100ML-% IV SOLN: 2.0000 g | INTRAVENOUS | Status: DC

## 2019-12-05 MED ORDER — FENTANYL CITRATE (PF) 100 MCG/2ML IJ SOLN
INTRAMUSCULAR | Status: AC | PRN
  Administered 2019-12-05: 25 ug via INTRAVENOUS

## 2019-12-05 MED ORDER — SODIUM CHLORIDE 0.9 % IV SOLN
INTRAVENOUS | Status: AC | PRN
  Administered 2019-12-05: 10 mL/h via INTRAVENOUS

## 2019-12-05 MED ORDER — MIDAZOLAM HCL 2 MG/2ML IJ SOLN
INTRAMUSCULAR | Status: AC | PRN
  Administered 2019-12-05: 1 mg via INTRAVENOUS

## 2019-12-05 MED ORDER — HEPARIN SODIUM (PORCINE) 1000 UNIT/ML IJ SOLN
INTRAMUSCULAR | Status: AC
  Filled 2019-12-05: qty 1

## 2019-12-05 MED ORDER — SODIUM CHLORIDE 0.9 % IV SOLN: INTRAVENOUS | Status: DC

## 2019-12-05 MED ORDER — NITROGLYCERIN 1 MG/10 ML FOR IR/CATH LAB
INTRA_ARTERIAL | Status: AC
  Administered 2019-12-05: 500 ug
  Filled 2019-12-05: qty 10

## 2019-12-05 NOTE — Brief Op Note (Signed)
  NEUROSURGERY BRIEF OPERATIVE  NOTE   PREOP DX: Brain aneurysms  POSTOP DX: Same  PROCEDURE: Diagnostic cerebral angiogram  SURGEON: Dr. Lisbeth Renshaw, MD  ANESTHESIA: IV Sedation with Local  EBL: Minimal  SPECIMENS: None  COMPLICATIONS: None  CONDITION: Stable to recovery  FINDINGS (Full report in [facility]): 1. Bilateral paraophthalmic ICA aneurysms, further described in radiology report

## 2019-12-05 NOTE — Discharge Instructions (Signed)
 Radial Site Care  This sheet gives you information about how to care for yourself after your procedure. Your health care provider may also give you more specific instructions. If you have problems or questions, contact your health care provider. What can I expect after the procedure? After the procedure, it is common to have:  Bruising and tenderness at the catheter insertion area. Follow these instructions at home: Medicines  Take over-the-counter and prescription medicines only as told by your health care provider. Insertion site care  Follow instructions from your health care provider about how to take care of your insertion site. Make sure you: ? Wash your hands with soap and water before you change your bandage (dressing). If soap and water are not available, use hand sanitizer. ? Change your dressing as told by your health care provider. ? Leave stitches (sutures), skin glue, or adhesive strips in place. These skin closures may need to stay in place for 2 weeks or longer. If adhesive strip edges start to loosen and curl up, you may trim the loose edges. Do not remove adhesive strips completely unless your health care provider tells you to do that.  Check your insertion site every day for signs of infection. Check for: ? Redness, swelling, or pain. ? Fluid or blood. ? Pus or a bad smell. ? Warmth.  Do not take baths, swim, or use a hot tub until your health care provider approves.  You may shower 24-48 hours after the procedure, or as directed by your health care provider. ? Remove the dressing and gently wash the site with plain soap and water. ? Pat the area dry with a clean towel. ? Do not rub the site. That could cause bleeding.  Do not apply powder or lotion to the site. Activity   For 24 hours after the procedure, or as directed by your health care provider: ? Do not flex or bend the affected arm. ? Do not push or pull heavy objects with the affected arm. ? Do not  drive yourself home from the hospital or clinic. You may drive 24 hours after the procedure unless your health care provider tells you not to. ? Do not operate machinery or power tools.  Do not lift anything that is heavier than 10 lb (4.5 kg), or the limit that you are told, until your health care provider says that it is safe.  Ask your health care provider when it is okay to: ? Return to work or school. ? Resume usual physical activities or sports. ? Resume sexual activity. General instructions  If the catheter site starts to bleed, raise your arm and put firm pressure on the site. If the bleeding does not stop, get help right away. This is a medical emergency.  If you went home on the same day as your procedure, a responsible adult should be with you for the first 24 hours after you arrive home.  Keep all follow-up visits as told by your health care provider. This is important. Contact a health care provider if:  You have a fever.  You have redness, swelling, or yellow drainage around your insertion site. Get help right away if:  You have unusual pain at the radial site.  The catheter insertion area swells very fast.  The insertion area is bleeding, and the bleeding does not stop when you hold steady pressure on the area.  Your arm or hand becomes pale, cool, tingly, or numb. These symptoms may represent a serious   problem that is an emergency. Do not wait to see if the symptoms will go away. Get medical help right away. Call your local emergency services (911 in the [place] Do not drive yourself to the hospital. Summary  After the procedure, it is common to have bruising and tenderness at the site.  Follow instructions from your health care provider about how to take care of your radial site wound. Check the wound every day for signs of infection.  Do not lift anything that is heavier than 10 lb (4.5 kg), or the limit that you are told, until your health care provider says  that it is safe. This information is not intended to replace advice given to you by your health care provider. Make sure you discuss any questions you have with your health care provider. Document Revised: 10/17/2017 Document Reviewed: 10/17/2017 Elsevier Patient Education  2020 Elsevier Inc.        Moderate Conscious Sedation, Adult, Care After These instructions provide you with information about caring for yourself after your procedure. Your health care provider may also give you more specific instructions. Your treatment has been planned according to current medical practices, but problems sometimes occur. Call your health care provider if you have any problems or questions after your procedure. What can I expect after the procedure? After your procedure, it is common:  To feel sleepy for several hours.  To feel clumsy and have poor balance for several hours.  To have poor judgment for several hours.  To vomit if you eat too soon. Follow these instructions at home: For at least 24 hours after the procedure:   Do not: ? Participate in activities where you could fall or become injured. ? Drive. ? Use heavy machinery. ? Drink alcohol. ? Take sleeping pills or medicines that cause drowsiness. ? Make important decisions or sign legal documents. ? Take care of children on your own.  Rest. Eating and drinking  Follow the diet recommended by your health care provider.  If you vomit: ? Drink water, juice, or soup when you can drink without vomiting. ? Make sure you have little or no nausea before eating solid foods. General instructions  Have a responsible adult stay with you until you are awake and alert.  Take over-the-counter and prescription medicines only as told by your health care provider.  If you smoke, do not smoke without supervision.  Keep all follow-up visits as told by your health care provider. This is important. Contact a health care provider  if:  You keep feeling nauseous or you keep vomiting.  You feel light-headed.  You develop a rash.  You have a fever. Get help right away if:  You have trouble breathing. This information is not intended to replace advice given to you by your health care provider. Make sure you discuss any questions you have with your health care provider. Document Revised: 08/24/2017 Document Reviewed: 01/01/2016 Elsevier Patient Education  2020 Elsevier Inc.  

## 2019-12-05 NOTE — Telephone Encounter (Signed)
-----   Message from Nyoka Cowden, MD sent at 12/04/2019  5:21 PM EST ----- Let her know I  received a call from the insurance company requesting more info than we have in her records but was able to swing a short term benefit for her that is contingent on her returning to see me or an alternate provider to work on the cough.   I won't be able to do anything further beyond today's phone call to support her claim but happy to help if she'll follow the instructions:  Return with all meds in hand to regroup. (all meds meds from all providers and all otcs)

## 2019-12-05 NOTE — Telephone Encounter (Signed)
Called pt but no answer- LMTCB

## 2019-12-10 ENCOUNTER — Encounter (HOSPITAL_COMMUNITY): Payer: BC Managed Care – PPO

## 2019-12-10 ENCOUNTER — Other Ambulatory Visit (HOSPITAL_COMMUNITY): Payer: Self-pay | Admitting: Physician Assistant

## 2019-12-10 DIAGNOSIS — M79601 Pain in right arm: Secondary | ICD-10-CM

## 2019-12-11 ENCOUNTER — Ambulatory Visit (HOSPITAL_COMMUNITY)
Admission: RE | Admit: 2019-12-11 | Discharge: 2019-12-11 | Disposition: A | Discharge status: Home / Self Care | Payer: BC Managed Care – PPO | Source: Ambulatory Visit | Attending: Physician Assistant | Admitting: Physician Assistant

## 2019-12-11 ENCOUNTER — Other Ambulatory Visit: Payer: Self-pay

## 2019-12-11 DIAGNOSIS — M79601 Pain in right arm: Secondary | ICD-10-CM | POA: Diagnosis not present

## 2019-12-11 DIAGNOSIS — M7989 Other specified soft tissue disorders: Secondary | ICD-10-CM

## 2019-12-11 NOTE — Progress Notes (Signed)
VASCULAR LAB PRELIMINARY  PRELIMINARY  PRELIMINARY  PRELIMINARY  Right upper extremity venous duplex completed.    Preliminary report:  See CV proc for preliminary results.   Called report.   Kalicia Dufresne, RVT 12/11/2019, 9:44 AM

## 2019-12-16 NOTE — Telephone Encounter (Signed)
I spoke with the [name] and have her scheduled with Rubye Oaks, NP, as Dr Sherene Sires with no openings for the next several wks.

## 2019-12-18 ENCOUNTER — Other Ambulatory Visit: Payer: Self-pay

## 2019-12-18 ENCOUNTER — Encounter: Payer: Self-pay | Admitting: Adult Health

## 2019-12-18 ENCOUNTER — Ambulatory Visit (INDEPENDENT_AMBULATORY_CARE_PROVIDER_SITE_OTHER): Payer: BC Managed Care – PPO | Admitting: Adult Health

## 2019-12-18 ENCOUNTER — Ambulatory Visit (INDEPENDENT_AMBULATORY_CARE_PROVIDER_SITE_OTHER): Payer: BC Managed Care – PPO

## 2019-12-18 VITALS — BP 126/82 | HR 101 | Temp 97.6°F | Ht 60.0 in | Wt 226.0 lb

## 2019-12-18 DIAGNOSIS — J1282 Pneumonia due to coronavirus disease 2019: Secondary | ICD-10-CM

## 2019-12-18 DIAGNOSIS — J4520 Mild intermittent asthma, uncomplicated: Secondary | ICD-10-CM

## 2019-12-18 DIAGNOSIS — Z20822 Contact with and (suspected) exposure to covid-19: Secondary | ICD-10-CM

## 2019-12-18 DIAGNOSIS — R058 Other specified cough: Secondary | ICD-10-CM

## 2019-12-18 DIAGNOSIS — R05 Cough: Secondary | ICD-10-CM

## 2019-12-18 DIAGNOSIS — K2100 Gastro-esophageal reflux disease with esophagitis, without bleeding: Secondary | ICD-10-CM

## 2019-12-18 DIAGNOSIS — U071 COVID-19: Secondary | ICD-10-CM

## 2019-12-18 DIAGNOSIS — J310 Chronic rhinitis: Secondary | ICD-10-CM | POA: Insufficient documentation

## 2019-12-18 DIAGNOSIS — Z6841 Body Mass Index (BMI) 40.0 and over, adult: Secondary | ICD-10-CM

## 2019-12-18 MED ORDER — BENZONATATE 200 MG PO CAPS: 200.0000 mg | ORAL_CAPSULE | Freq: Three times a day (TID) | ORAL | 1 refills | Status: DC | PRN

## 2019-12-18 NOTE — Assessment & Plan Note (Signed)
New on Singulair daily.  Add Allegra daily.  Saline nasal rinses as needed

## 2019-12-18 NOTE — Patient Instructions (Addendum)
Increase Pepcid 20mg  Twice daily  .  GERD diet .  Begin Delsym 2 tsp Twice daily  For cough , as needed.  Begin Tessalon Three times a day  For cough , As needed   Begin Allegra 180mg  daily in am .  Begin Chlrotrimeton 4mg  At bedtime  .  Voice rest . Sips of water to soothe throat . No MINTS.  Chest xray today .  Follow up in 2 weeks with PFT with Dr.  Or Zyra Parrillo NP and As needed  Please contact office for sooner follow up if symptoms do not improve or worsen or seek emergency care

## 2019-12-18 NOTE — Assessment & Plan Note (Signed)
Patient has significant weight gain with multiple courses of steroids.  Going forward would avoid steroids if possible.  Patient is encouraged on healthy weight loss.

## 2019-12-18 NOTE — Assessment & Plan Note (Signed)
Chronic cough worse since March 2020.  Unresponsive to inhalers including Dulera and Qvar.  Intolerant to Neurontin. Continue to treat for underlying chronic rhinitis and GERD. We will add Allegra, increase Pepcid to twice daily.  Add cough suppression regimen including Tessalon and Delsym. She needs PFTs. We will repeat chest x-ray today as she had COVID-19 in January.  Pending these results we will decide on a high-resolution CT chest.  Plan  Patient Instructions  Increase Pepcid 20mg  Twice daily  .  GERD diet .  Begin Delsym 2 tsp Twice daily  For cough , as needed.  Begin Tessalon Three times a day  For cough , As needed   Begin Allegra 180mg  daily in am .  Begin Chlrotrimeton 4mg  At bedtime  .  Voice rest . Sips of water to soothe throat . No MINTS.  Chest xray today .  Follow up in 2 weeks with PFT with Dr.  Or Tarnesha Ulloa NP and As needed  Please contact office for sooner follow up if symptoms do not improve or worsen or seek emergency care

## 2019-12-18 NOTE — Progress Notes (Signed)
@Patient  ID: , female    DOB: Jul 16, 1986, 34 y.o.   MRN: [id number]  Chief Complaint  Patient presents with  . Follow-up    Referring provider: [id number], MD  HPI: 34 year old female never smoker seen for pulmonary consult May 2020 for chronic cough-severe cough starting in March 2020 Diagnosed with COVID-14 October 2019 Medical history significant for narcolepsy   TEST/EVENTS :  Cough Workup :  Grew up in 16 October 2019 Diagnosed with childhood asthma 2017 diagnosed with pneumonia and frequent URIs Evaluation with [facility] 2018 through 2020, underwent bronchoscopy.  Had positive  allergic skin testing.(01/01/17 scratch testing positive reactions to tree, grass, and weed pollens, mold spores, and dust mites)  ., No perceived benefit with Dulera, QVAR .  Endoscopy October 2020 with GI positive for for reflux esophagitis Allergy profile 05/16/19 >  Eos 110 /  IgE  12 with RAST pos grass, cedar, dust   12/18/2019 Follow up : Chronic cough and dyspnea.  Patient returns for a 35-month follow-up.  She has been undergoing extensive work-up over the last year for chronic cough that worsened in March 2020.  He has been seen previously at [facility] chest felt to have asthma and allergies.  She has been tried on [facility] and Qvar with no perceived benefit.  IgE was normal at 12.  Her allergy profile was positive to grass cedar and dust.  Previous skin testing was positive for trees grasses and weeds and pollens and dust mites.  Eosinophils were 110. She has been tried on multiple regimens including Neurontin which she was intolerant to she was tried on PPI therapy both intolerant.  Suspected to have chronic rhinitis and GERD.  She underwent endoscopy with GI in October 2020 that showed reflux esophagitis.  She is currently on Pepcid. She has been restarted on Singulair  .  She has gained over 30 pounds.  She is been on steroid tapers over the last year 9 separate times.  She is unable  to work due to persistent cough and shortness of breath with activity.  Previous CT on May 2020 showed no PE.  Clusters of groundglass nodules in the right lower lobe and scarring in the left upper lobe.  She previously went under a bronchoscopy with [facility] and was told was unremarkable.    She is recently had some other medical problems including DVT in her right arm felt to be secondary to recent angiogram.  She is currently on Xarelto. She did have COVID-19 in January 2021. Patient says she is currently just using Robitussin-DM for cough.  Cough is throughout the day.  She gets short of breath when she tries to do activity.  She denies any wheezing.  She denies any hemoptysis.   Allergies  Allergen Reactions  . Other Anaphylaxis and Other (See Comments)    Pt is allergic to balsamic vinaigrette.    Pt is allergic to balsamic vinaigrette.   balsalmic vingerette  . Soy Allergy Anaphylaxis  . Gluten Meal Diarrhea and Rash    Bloating   . Sumatriptan Rash  . Zolmitriptan Rash  . Ciprofloxacin Other (See Comments)    Reaction:  Burning of pts arm  . Nsaids Other (See Comments)    Pt states that she was told to avoid this class of medication.    . Topiramate Other (See Comments)    Reaction:  Unknown   . Adhesive [Tape] Itching and Rash  . Triptans Rash    Immunization History  Administered Date(s) Administered  . PPD Test 09/08/2014, 10/18/2015    Past Medical History:  Diagnosis Date  . Anemia    takes Ferrous Sulfate daily  . Anxiety    takes Klonopin nightly  . ASTHMA NOS W/ACUTE EXACERBATION 01/05/2010   exercise induced  . Back pain   . Brain aneurysm   . Coarse tremors   . Degenerative disc disease    back  . Gallstones   . GERD (gastroesophageal reflux disease)    but doesn't take anything for it  . History of kidney stones   . History of migraine    last one a couple of days ago  . History of UTI    frequent  . IBS (irritable bowel syndrome)    doesn't  take any meds  . Medullary sponge kidney 2007   kidneys can be sick but she isnt sick  . MIGRAINES, HX OF 06/24/2007  . Narcolepsy   . 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 OCD (obsessive compulsive disorder)    takes Prozac daily  . PYELONEPHRITIS 06/24/2007  . Seizures ([place])    takes Lamictal daily;last seizure was in 11/14/10  . Urinary frequency     Tobacco History: Social History   Tobacco Use  Smoking Status Never Smoker  Smokeless Tobacco Never Used   Counseling given: Not Answered   Outpatient Medications Prior to Visit  Medication Sig Dispense Refill  . albuterol (VENTOLIN HFA) 108 (90 Base) MCG/ACT inhaler Inhale 2 puffs into the lungs every 4 (four) hours as needed.    . chlorpheniramine-HYDROcodone (TUSSIONEX) 10-8 MG/5ML SUER SMARTSIG:5 Milliliter(s) By Mouth Every 12 Hours PRN    . EPINEPHrine (EPIPEN 2-PAK) 0.3 mg/0.3 mL IJ SOAJ injection Inject 0.3 mg into the muscle once as needed (for severe allergic reaction).    . famotidine (PEPCID) 20 MG tablet 1 pill every morning after waking and 1 pill at bedtime every night. 60 tablet 6  . guaiFENesin-dextromethorphan (ROBITUSSIN DM) 100-10 MG/5ML syrup Take 5 mLs by mouth every 4 (four) hours as needed for cough.    . montelukast (SINGULAIR) 10 MG tablet One at bedtime    . norgestrel-ethinyl estradiol (CRYSELLE-28) 0.3-30 MG-MCG tablet Take 1 tablet by mouth daily.    . traZODone (DESYREL) 50 MG tablet TAKE 1 TABLET BY MOUTH EVERYDAY AT BEDTIME 30 tablet 5  . predniSONE (DELTASONE) 10 MG tablet Take  4 each am x 2 days,   2 each am x 2 days,  1 each am x 2 days and stop 14 tablet 0   No facility-administered medications prior to visit.     Review of Systems:   Constitutional:   No  weight loss, night sweats,  Fevers, chills, + fatigue, or  lassitude.  HEENT:   No headaches,  Difficulty swallowing,  Tooth/dental problems, or  Sore throat,                No sneezing, itching, ear ache, + nasal congestion, post nasal drip,    CV:  No chest pain,  Orthopnea, PND, swelling in lower extremities, anasarca, dizziness, palpitations, syncope.   GI  No heartburn, indigestion, abdominal pain, nausea, vomiting, diarrhea, change in bowel habits, loss of appetite, bloody stools.   Resp:    No chest wall deformity  Skin: no rash or lesions.  GU: no dysuria, change in color of urine, no urgency or frequency.  No flank pain, no hematuria   MS:  No joint pain or swelling.  No decreased range of motion.  No back pain.    Physical Exam  BP 126/82 (BP Location: Left Arm, Cuff Size: Normal)   Pulse (!) 101   Temp 97.6 F (36.4 C) (Temporal)   Ht 5' (1.524 m)   Wt 226 lb (102.5 kg)   LMP 12/15/2019   SpO2 98% Comment: RA  BMI 44.14 kg/m   GEN: A/Ox3; pleasant , NAD, well nourished    HEENT:  [place]/AT,    NOSE-clear, THROAT-clear, no lesions, no postnasal drip or exudate noted.   NECK:  Supple w/ fair ROM; no JVD; normal carotid impulses w/o bruits; no thyromegaly or nodules palpated; no lymphadenopathy.    RESP  Clear  P & A; w/o, wheezes/ rales/ or rhonchi. no accessory muscle use, no dullness to percussion  CARD:  RRR, no m/r/g, no peripheral edema, pulses intact, no cyanosis or clubbing.  GI:   Soft & nt; nml bowel sounds; no organomegaly or masses detected.   Musco: Warm bil, no deformities or joint swelling noted.   Neuro: alert, no focal deficits noted.    Skin: Warm, no lesions or rashes    Lab Results:  CBC    Component Value Date/Time   WBC 5.8 12/05/2019 0746   RBC 4.36 12/05/2019 0746   HGB 12.7 12/05/2019 0746   HGB 13.3 11/06/2018 1120   HCT 38.9 12/05/2019 0746   HCT 40.5 11/06/2018 1120   PLT 313 12/05/2019 0746   PLT 331 11/06/2018 1120   MCV 89.2 12/05/2019 0746   MCV 88 11/06/2018 1120   MCH 29.1 12/05/2019 0746   MCHC 32.6 12/05/2019 0746   RDW 12.7 12/05/2019 0746   RDW 12.2 11/06/2018 1120   LYMPHSABS 1.3 12/05/2019 0746   LYMPHSABS 2.3 10/30/2013 1615   MONOABS 0.3  12/05/2019 0746   EOSABS 0.1 12/05/2019 0746   EOSABS 0.1 10/30/2013 1615   BASOSABS 0.1 12/05/2019 0746   BASOSABS 0.1 10/30/2013 1615    BMET    Component Value Date/Time   NA 136 12/05/2019 0746   NA 140 11/06/2018 1120   K 4.2 12/05/2019 0746   CL 105 12/05/2019 0746   CO2 22 12/05/2019 0746   GLUCOSE 100 (H) 12/05/2019 0746   BUN 11 12/05/2019 0746   BUN 8 11/06/2018 1120   CREATININE 0.75 12/05/2019 0746   CREATININE 0.77 12/23/2013 0837   CALCIUM 8.9 12/05/2019 0746   GFRNONAA >60 12/05/2019 0746   GFRAA >60 12/05/2019 0746    BNP No results found for: BNP  ProBNP No results found for: PROBNP  Imaging:     No flowsheet data found.  No results found for: NITRICOXIDE      Assessment & Plan:   Mild intermittent asthma vs UACS/cyclical coughing  Chronic cough worse since March 2020.  Unresponsive to inhalers including Dulera and Qvar.  Intolerant to Neurontin. Continue to treat for underlying chronic rhinitis and GERD. We will add Allegra, increase Pepcid to twice daily.  Add cough suppression regimen including Tessalon and Delsym. She needs PFTs. We will repeat chest x-ray today as she had COVID-19 in January.  Pending these results we will decide on a high-resolution CT chest.  Plan  Patient Instructions  Increase Pepcid 20mg  Twice daily  .  GERD diet .  Begin Delsym 2 tsp Twice daily  For cough , as needed.  Begin Tessalon Three times a day  For cough , As needed   Begin Allegra 180mg  daily in am .  Begin Chlrotrimeton 4mg  At bedtime  .  Voice rest . Sips of water to soothe throat . No MINTS.  Chest xray today .  Follow up in 2 weeks with PFT with Dr. Sherene Sires  Or Janari Yamada NP and As needed  Please contact office for sooner follow up if symptoms do not improve or worsen or seek emergency care          GERD (gastroesophageal reflux disease) Reflux esophagitis on endoscopy October 2020.  Intolerant to PPI.  Increase Pepcid to twice daily.  GERD  diet.  Class 3 severe obesity with body mass index (BMI) of 40.0 to 44.9 in adult [facility]) Patient has significant weight gain with multiple courses of steroids.  Going forward would avoid steroids if possible.  Patient is encouraged on healthy weight loss.  Chronic rhinitis New on Singulair daily.  Add Allegra daily.  Saline nasal rinses as needed    Total patient care time 50 minutes  Rubye Oaks, NP 12/18/2019

## 2019-12-18 NOTE — Assessment & Plan Note (Signed)
Reflux esophagitis on endoscopy October 2020.  Intolerant to PPI.  Increase Pepcid to twice daily.  GERD diet.

## 2019-12-29 ENCOUNTER — Telehealth: Payer: Self-pay | Admitting: Adult Health

## 2019-12-29 DIAGNOSIS — R05 Cough: Secondary | ICD-10-CM

## 2019-12-29 DIAGNOSIS — U071 COVID-19: Secondary | ICD-10-CM

## 2019-12-29 DIAGNOSIS — R059 Cough, unspecified: Secondary | ICD-10-CM

## 2019-12-29 DIAGNOSIS — J1282 Pneumonia due to coronavirus disease 2019: Secondary | ICD-10-CM

## 2019-12-29 NOTE — Telephone Encounter (Signed)
-----   Message from Velvet Bathe, [place] sent at 12/19/2019  2:13 PM EDT ----- Pt called back, aware of results/recs.  Pt states that her cxr's always come back clear but abnormalities are always noted with a CT chest.  Pt states this was mentioned yesterday, would like TP's recs for obtaining a CT chest.    TP please advise.  Thanks.

## 2019-12-29 NOTE — Telephone Encounter (Signed)
ATC pt, no answer. Left message for pt to call back.  CT High Resolution ordered.

## 2019-12-29 NOTE — Telephone Encounter (Signed)
That is fine , lets set up for HRCT Chest -Chronic cough and  COVID 19 09/2019 .  Make sure she keeps follow up visit and PFTs   Please contact office for sooner follow up if symptoms do not improve or worsen or seek emergency care

## 2020-01-06 ENCOUNTER — Other Ambulatory Visit (HOSPITAL_COMMUNITY)
Admission: RE | Admit: 2020-01-06 | Discharge: 2020-01-06 | Disposition: A | Discharge status: Home / Self Care | Payer: BC Managed Care – PPO | Source: Ambulatory Visit | Attending: Adult Health | Admitting: Adult Health

## 2020-01-06 ENCOUNTER — Other Ambulatory Visit (HOSPITAL_COMMUNITY): Payer: BC Managed Care – PPO

## 2020-01-06 ENCOUNTER — Other Ambulatory Visit: Payer: Self-pay

## 2020-01-06 DIAGNOSIS — Z01812 Encounter for preprocedural laboratory examination: Secondary | ICD-10-CM | POA: Insufficient documentation

## 2020-01-06 DIAGNOSIS — Z20822 Contact with and (suspected) exposure to covid-19: Secondary | ICD-10-CM | POA: Diagnosis not present

## 2020-01-06 LAB — SARS CORONAVIRUS 2 (TAT 6-24 HRS): SARS Coronavirus 2: NEGATIVE

## 2020-01-07 ENCOUNTER — Ambulatory Visit (HOSPITAL_COMMUNITY)
Admission: RE | Admit: 2020-01-07 | Discharge: 2020-01-07 | Disposition: A | Discharge status: Home / Self Care | Payer: BC Managed Care – PPO | Source: Ambulatory Visit | Attending: Adult Health | Admitting: Adult Health

## 2020-01-07 DIAGNOSIS — R05 Cough: Secondary | ICD-10-CM | POA: Insufficient documentation

## 2020-01-07 DIAGNOSIS — R059 Cough, unspecified: Secondary | ICD-10-CM

## 2020-01-07 DIAGNOSIS — J1282 Pneumonia due to coronavirus disease 2019: Secondary | ICD-10-CM | POA: Diagnosis present

## 2020-01-07 DIAGNOSIS — U071 COVID-19: Secondary | ICD-10-CM | POA: Diagnosis present

## 2020-01-08 ENCOUNTER — Other Ambulatory Visit: Payer: Self-pay | Admitting: Internal Medicine

## 2020-01-08 DIAGNOSIS — J4520 Mild intermittent asthma, uncomplicated: Secondary | ICD-10-CM

## 2020-01-09 ENCOUNTER — Ambulatory Visit (INDEPENDENT_AMBULATORY_CARE_PROVIDER_SITE_OTHER): Payer: BC Managed Care – PPO | Admitting: Internal Medicine

## 2020-01-09 ENCOUNTER — Ambulatory Visit (INDEPENDENT_AMBULATORY_CARE_PROVIDER_SITE_OTHER): Payer: BC Managed Care – PPO | Admitting: Adult Health

## 2020-01-09 ENCOUNTER — Encounter: Payer: Self-pay | Admitting: Adult Health

## 2020-01-09 ENCOUNTER — Other Ambulatory Visit: Payer: Self-pay

## 2020-01-09 DIAGNOSIS — J310 Chronic rhinitis: Secondary | ICD-10-CM | POA: Diagnosis not present

## 2020-01-09 DIAGNOSIS — J4520 Mild intermittent asthma, uncomplicated: Secondary | ICD-10-CM

## 2020-01-09 DIAGNOSIS — K2100 Gastro-esophageal reflux disease with esophagitis, without bleeding: Secondary | ICD-10-CM | POA: Diagnosis not present

## 2020-01-09 DIAGNOSIS — Z6841 Body Mass Index (BMI) 40.0 and over, adult: Secondary | ICD-10-CM

## 2020-01-09 LAB — PULMONARY FUNCTION TEST
DL/VA % pred: 117 %
DL/VA: 5.43 ml/min/mmHg/L
DLCO cor % pred: 124 %
DLCO cor: 25.98 ml/min/mmHg
DLCO unc % pred: 122 %
DLCO unc: 25.41 ml/min/mmHg
FEF 25-75 Post: 3.64 L/sec
FEF 25-75 Pre: 2.99 L/sec
FEF2575-%Change-Post: 21 %
FEF2575-%Pred-Post: 111 %
FEF2575-%Pred-Pre: 92 %
FEV1-%Change-Post: 5 %
FEV1-%Pred-Post: 110 %
FEV1-%Pred-Pre: 105 %
FEV1-Post: 3.28 L
FEV1-Pre: 3.1 L
FEV1FVC-%Change-Post: 4 %
FEV1FVC-%Pred-Pre: 97 %
FEV6-%Change-Post: 0 %
FEV6-%Pred-Post: 107 %
FEV6-%Pred-Pre: 108 %
FEV6-Post: 3.76 L
FEV6-Pre: 3.77 L
FEV6FVC-%Change-Post: 0 %
FEV6FVC-%Pred-Post: 101 %
FEV6FVC-%Pred-Pre: 100 %
FVC-%Change-Post: 1 %
FVC-%Pred-Post: 109 %
FVC-%Pred-Pre: 107 %
FVC-Post: 3.84 L
FVC-Pre: 3.8 L
Post FEV1/FVC ratio: 85 %
Post FEV6/FVC ratio: 100 %
Pre FEV1/FVC ratio: 82 %
Pre FEV6/FVC Ratio: 100 %
RV % pred: 117 %
RV: 1.58 L
TLC % pred: 107 %
TLC: 5.13 L

## 2020-01-09 MED ORDER — BENZONATATE 200 MG PO CAPS: 200.0000 mg | ORAL_CAPSULE | Freq: Three times a day (TID) | ORAL | 5 refills | Status: AC | PRN

## 2020-01-09 NOTE — Patient Instructions (Signed)
Continue on Pepcid 20mg  Twice daily  .  GERD diet .   Delsym 2 tsp Twice daily  For cough , as needed.  Tessalon Three times a day  For cough , As needed   Allegra 180mg  daily in am .  Chlrotrimeton 4mg  At bedtime  .  Voice rest . Sips of water to soothe throat . No MINTS.  Follow up in 2 months  with Dr.  Or Tyesha Joffe NP and As needed  Please contact office for sooner follow up if symptoms do not improve or worsen or seek emergency care

## 2020-01-09 NOTE — Progress Notes (Signed)
@Patient  ID: , female    DOB: 1986/02/08, 34 y.o.   MRN: [id number]  Chief Complaint  Patient presents with  . Follow-up    Cough     Referring provider: [id number], MD  HPI: 34 year old female never smoker seen for pulmonary consult May 2020 for chronic cough that began in March 2020. Diagnosed with COVID-14 October 2019 Medical history significant for narcolepsy  TEST/EVENTS :  Cough Workup :  Grew up in 16 October 2019 Diagnosed with childhood asthma 2017 diagnosed with pneumonia and frequent URIs Evaluation with [facility] 2018 through 2020, underwent bronchoscopy.  Had positive  allergic skin testing.(01/01/17 scratch testing positive reactions to tree, grass, and weed pollens, mold spores, and dust mites)  ., No perceived benefit with Dulera, QVAR .  Endoscopy October 2020 with GI positive for for reflux esophagitis Allergy profile 05/16/19 >  Eos 110 /  IgE  12 with RAST pos grass, cedar, dust   01/09/2020 Follow up : Chronic cough 01/11/2020 Patient returns for a 3-week follow-up.  Patient has underwent an extensive work-up over the last year for chronic cough that worsened in March 2020.  She had previously been seen at [facility] chest to have evaluation of asthma and allergies.  She has been tried on [facility] and Qvar with no perceived benefit.  IgE was 12.  Allergy profile was positive to grass, cedar, dust.  Previous skin testing was positive for trees, grasses, weeds, pollen and dust mites.  Eosinophils were 110.  Her regimens including Neurontin and PPIs which she was intolerant to.  She does have chronic rhinitis and GERD.  She underwent endoscopy with GI and October 2020 that showed reflux esophagitis.  She is currently on Pepcid.  She has been restarted on Singulair.  She is had multiple steroid tapers over the last year on 9 separate events.  Her weight has increased over 30 pounds.  Is been unable to work due to persistent cough and shortness of breath.  Review  CT chest May 2020 showed no pulmonary embolism.  Cluster of groundglass nodules in the right lower lobe and scarring in the left upper lobe.  She underwent a bronchoscopy with [facility] and was told it was unremarkable. She was started on Delsym, Tessalon, Allegra, Chlor-Trimeton and Pepcid.  Says she is really not noticed much difference in her cough.  Continues to have a dry cough is provoked by deep inspiration and activity.  She has tried albuterol without any relief.  Feels that inhalers make her cough worse. She was also diagnosed with a DVT in her right arm felt to be secondary to her recent angiogram.  She is currently on Xarelto.  She was diagnosed with COVID-19 in January 2021.  She was set up for pulmonary function testing that was completed today that showed normal lung function with an FEV1 at 110%, ratio 85, FVC 109%, no significant bronchodilator response.  She did have some mid flow reversibility but no mid flow obstruction.  DLCO 122%.  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  Some very mild scattered pulmonary parenchymal groundglass seen peripherally.  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 Plans for shunt placement in future.    Allergies  Allergen Reactions  . Other Anaphylaxis and Other (See Comments)    Pt is allergic to balsamic vinaigrette.  Pt is allergic to balsamic vinaigrette.   balsalmic vingerette  . Soy Allergy Anaphylaxis  . Gluten Meal Diarrhea and Rash    Bloating   . Sumatriptan Rash  . Zolmitriptan Rash  . Ciprofloxacin Other (See Comments)    Reaction:  Burning of pts arm  . Nsaids Other (See Comments)    Pt states that she was told to avoid this class of medication.    . Topiramate Other (See Comments)     Reaction:  Unknown   . Adhesive [Tape] Itching and Rash  . Triptans Rash    Immunization History  Administered Date(s) Administered  . PPD Test 09/08/2014, 10/18/2015    Past Medical History:  Diagnosis Date  . Anemia    takes Ferrous Sulfate daily  . Anxiety    takes Klonopin nightly  . ASTHMA NOS W/ACUTE EXACERBATION 01/05/2010   exercise induced  . Back pain   . Brain aneurysm   . Coarse tremors   . Degenerative disc disease    back  . Gallstones   . GERD (gastroesophageal reflux disease)    but doesn't take anything for it  . History of kidney stones   . History of migraine    last one a couple of days ago  . History of UTI    frequent  . IBS (irritable bowel syndrome)    doesn't take any meds  . Medullary sponge kidney 2007   kidneys can be sick but she isnt sick  . MIGRAINES, HX OF 06/24/2007  . Narcolepsy   . 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 OCD (obsessive compulsive disorder)    takes Prozac daily  . PYELONEPHRITIS 06/24/2007  . Seizures ([place])    takes Lamictal daily;last seizure was in 11/14/10  . Urinary frequency     Tobacco History: Social History   Tobacco Use  Smoking Status Never Smoker  Smokeless Tobacco Never Used   Counseling given: Not Answered   Outpatient Medications Prior to Visit  Medication Sig Dispense Refill  . albuterol (VENTOLIN HFA) 108 (90 Base) MCG/ACT inhaler Inhale 2 puffs into the lungs every 4 (four) hours as needed.    . benzonatate (TESSALON) 200 MG capsule Take 1 capsule (200 mg total) by mouth 3 (three) times daily as needed for cough. 30 capsule 1  . EPINEPHrine (EPIPEN 2-PAK) 0.3 mg/0.3 mL IJ SOAJ injection Inject 0.3 mg into the muscle once as needed (for severe allergic reaction).    . famotidine (PEPCID) 20 MG tablet 1 pill every morning after waking and 1 pill at bedtime every night. 60 tablet 6  . montelukast (SINGULAIR) 10 MG tablet One at bedtime    . rivaroxaban (XARELTO) 10 MG TABS tablet Take 20 mg by mouth daily.  Patient on starter pack    . traZODone (DESYREL) 50 MG tablet TAKE 1 TABLET BY MOUTH EVERYDAY AT BEDTIME 30 tablet 5  . chlorpheniramine-HYDROcodone (TUSSIONEX) 10-8 MG/5ML SUER SMARTSIG:5 Milliliter(s) By Mouth Every 12 Hours PRN    . guaiFENesin-dextromethorphan (ROBITUSSIN DM) 100-10 MG/5ML syrup Take 5 mLs by mouth every 4 (four) hours as needed for cough.    . norgestrel-ethinyl estradiol (CRYSELLE-28) 0.3-30 MG-MCG tablet Take 1 tablet by mouth daily.     No facility-administered medications prior to visit.     Review of Systems:   Constitutional:   No  weight loss, night sweats,  Fevers, chills,  +fatigue, or  lassitude.  HEENT:   No headaches,  Difficulty swallowing,  Tooth/dental problems, or  Sore throat,  No sneezing, itching, ear ache,  +nasal congestion, post nasal drip,   CV:  No chest pain,  Orthopnea, PND, swelling in lower extremities, anasarca, dizziness, palpitations, syncope.   GI  +heartburn, indigestion, no abdominal pain, nausea, vomiting, diarrhea, change in bowel habits, loss of appetite, bloody stools.   Resp: .  No chest wall deformity  Skin: no rash or lesions.  GU: no dysuria, change in color of urine, no urgency or frequency.  No flank pain, no hematuria   MS:  No joint pain or swelling.  No decreased range of motion.  No back pain.    Physical Exam  BP 122/80 (BP Location: Left Arm, Cuff Size: Normal)   Pulse 86   Temp 98 F (36.7 C) (Temporal)   Ht 5\' 2"  (1.575 m)   Wt 228 lb 6.4 oz (103.6 kg)   LMP 12/15/2019   SpO2 99% Comment: RA  BMI 41.77 kg/m   GEN: A/Ox3; pleasant , NAD, well nourished    HEENT:  [place]/AT, NOSE-clear, THROAT-clear, no lesions, no postnasal drip or exudate noted.   NECK:  Supple w/ fair ROM; no JVD; normal carotid impulses w/o bruits; no thyromegaly or nodules palpated; no lymphadenopathy.    RESP  Clear  P & A; w/o, wheezes/ rales/ or rhonchi. no accessory muscle use, no dullness to  percussion  CARD:  RRR, no m/r/g, no peripheral edema, pulses intact, no cyanosis or clubbing.  GI:   Soft & nt; nml bowel sounds; no organomegaly or masses detected.   Musco: Warm bil, no deformities or joint swelling noted.   Neuro: alert, no focal deficits noted.    Skin: Warm, no lesions or rashes    Lab Results:  CBC  BMET  BNP No results found for: BNP  ProBNP No results found for: PROBNP  Imaging: DG Chest 2 View  Result Date: 12/18/2019 CLINICAL DATA:  Chronic cough. History of previous COVID-19 pneumonia EXAM: CHEST - 2 VIEW COMPARISON:  August 29, 2019 FINDINGS: The lungs are clear. The heart size and pulmonary vascularity are normal. No adenopathy. No bone lesions. IMPRESSION: Lungs clear.  No adenopathy. Electronically Signed   By: August 31, 2019 III M.D.   On: 12/18/2019 11:31   CT Chest High Resolution  Result Date: 01/07/2020 CLINICAL DATA:  Persistent cough.  History of COVID-19 infection. EXAM: CT CHEST WITHOUT CONTRAST TECHNIQUE: Multidetector CT imaging of the chest was performed following the standard protocol without intravenous contrast. High resolution imaging of the lungs, as well as inspiratory and expiratory imaging, was performed. COMPARISON:  08/08/2017. FINDINGS: Cardiovascular: Heart size normal.  No pericardial effusion. Mediastinum/Nodes: No pathologically enlarged mediastinal or axillary lymph nodes. Hilar regions are difficult to definitively evaluate without IV contrast. Esophagus is grossly unremarkable. Lungs/Pleura: Mild scattered pulmonary parenchymal ground-glass is seen peripherally. No architectural distortion, subpleural reticulation, traction bronchiectasis/bronchiolectasis or honeycombing. No pleural fluid. Airway is unremarkable. No air trapping. Upper Abdomen: Visualized portions of the liver, adrenal glands, kidneys, spleen pancreas, stomach and bowel are grossly unremarkable. Cholecystectomy. Musculoskeletal: No worrisome lytic or  sclerotic lesions. 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 No additional evidence of interstitial lung disease. Electronically Signed   By: 08/10/2017 M.D.   On: 01/07/2020 14:04   VAS 01/09/2020 UPPER EXTREMITY VENOUS DUPLEX  Result Date: 12/11/2019 UPPER VENOUS STUDY  Indications: Pain Other Indications: S/P cerebral angiogram3/12/21. Risk Factors: Bilateral paraophthalmic internal carotid artery. Limitations: Body habitus and poor ultrasound/tissue interface. Comparison Study: No prior study on file Performing Technologist: Candace  Rosezetta Schlatter RVS  Examination Guidelines: A complete evaluation includes B-mode imaging, spectral Doppler, color Doppler, and power Doppler as needed of all accessible portions of each vessel. Bilateral testing is considered an integral part of a complete examination. Limited examinations for reoccurring indications may be performed as noted.  Right Findings: +----------+------------+---------+-----------+----------+-------+ RIGHT     CompressiblePhasicitySpontaneousPropertiesSummary +----------+------------+---------+-----------+----------+-------+ IJV           Full       Yes       Yes                      +----------+------------+---------+-----------+----------+-------+ Subclavian               Yes       Yes                      +----------+------------+---------+-----------+----------+-------+ Axillary                 Yes       Yes                      +----------+------------+---------+-----------+----------+-------+ Brachial    Partial                                  Acute  +----------+------------+---------+-----------+----------+-------+ Radial        Full                                          +----------+------------+---------+-----------+----------+-------+ Ulnar         Full                                          +----------+------------+---------+-----------+----------+-------+ Cephalic      Full                                           +----------+------------+---------+-----------+----------+-------+ Basilic     Partial                                  Acute  +----------+------------+---------+-----------+----------+-------+  Left Findings: +----------+------------+---------+-----------+----------+-------+ LEFT      CompressiblePhasicitySpontaneousPropertiesSummary +----------+------------+---------+-----------+----------+-------+ Subclavian               Yes       Yes                      +----------+------------+---------+-----------+----------+-------+  Summary:  Right: Findings consistent with acute deep vein thrombosis involving the right brachial veins. Findings consistent with acute superficial vein thrombosis involving the right basilic vein. Non occlusive DVT noted in one of the paired brachial veins. Non occlusive SVT noted in small portion of the basilic vein in the mid upper arm.  Left: No evidence of thrombosis in the subclavian.  *See table(s) above for measurements and observations.  Diagnosing physician: Lemar Livings MD Electronically signed by Lemar Livings MD on 12/11/2019 at 4:59:35 PM.    Final       PFT Results Latest Ref Rng & Units 01/09/2020  FVC-Pre L 3.80  FVC-Predicted Pre % 107  FVC-Post L 3.84  FVC-Predicted Post % 109  Pre FEV1/FVC % % 82  Post FEV1/FCV % % 85  FEV1-Pre L 3.10  FEV1-Predicted Pre % 105  FEV1-Post L 3.28  DLCO UNC% % 122  DLCO COR %Predicted % 117  TLC L 5.13  TLC % Predicted % 107  RV % Predicted % 117    No results found for: NITRICOXIDE      Assessment & Plan:   Mild intermittent asthma vs UACS/cyclical coughing  Continues to have upper airway cough.  Pulmonary function testing is normal.  High-resolution CT chest shows no interstitial lung disease.  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   No perceived benefit with inhalers.  We will continue on cough control regimen and control for triggers.  Follow-up in 2  months.  Plan  Patient Instructions  Continue on Pepcid 20mg  Twice daily  .  GERD diet .   Delsym 2 tsp Twice daily  For cough , as needed.  Tessalon Three times a day  For cough , As needed   Allegra 180mg  daily in am .  Chlrotrimeton 4mg  At bedtime  .  Voice rest . Sips of water to soothe throat . No MINTS.  Follow up in 2 months  with Dr. Sherene SiresWert  Or Mckinlee Dunk NP and As needed  Please contact office for sooner follow up if symptoms do not improve or worsen or seek emergency care          Chronic rhinitis Continue on current regimen  GERD (gastroesophageal reflux disease) Continue on Pepcid twice daily.  GERD diet.  Intolerant to PPIs.  Continue follow-up with GI  Class 3 severe obesity with body mass index (BMI) of 40.0 to 44.9 in adult [facility](HCC) Healthy weight loss     Rubye Oaksammy Zaniel Marineau, NP 01/09/2020

## 2020-01-09 NOTE — Assessment & Plan Note (Signed)
Continues to have upper airway cough.  Pulmonary function testing is normal.  High-resolution CT chest shows no interstitial lung disease.  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   No perceived benefit with inhalers.  We will continue on cough control regimen and control for triggers.  Follow-up in 2 months.  Plan  Patient Instructions  Continue on Pepcid 20mg  Twice daily  .  GERD diet .   Delsym 2 tsp Twice daily  For cough , as needed.  Tessalon Three times a day  For cough , As needed   Allegra 180mg  daily in am .  Chlrotrimeton 4mg  At bedtime  .  Voice rest . Sips of water to soothe throat . No MINTS.  Follow up in 2 months  with Dr.  Or Edwar Coe NP and As needed  Please contact office for sooner follow up if symptoms do not improve or worsen or seek emergency care

## 2020-01-09 NOTE — Progress Notes (Signed)
PFT done today. 

## 2020-01-09 NOTE — Assessment & Plan Note (Signed)
Continue on Pepcid twice daily.  GERD diet.  Intolerant to PPIs.  Continue follow-up with GI

## 2020-01-09 NOTE — Assessment & Plan Note (Signed)
Healthy weight loss 

## 2020-01-09 NOTE — Assessment & Plan Note (Signed)
Continue on current regimen .   

## 2020-01-12 ENCOUNTER — Encounter: Payer: Self-pay | Admitting: *Deleted

## 2020-01-12 ENCOUNTER — Telehealth: Payer: Self-pay | Admitting: Internal Medicine

## 2020-01-12 DIAGNOSIS — R058 Other specified cough: Secondary | ICD-10-CM

## 2020-01-12 DIAGNOSIS — R05 Cough: Secondary | ICD-10-CM

## 2020-01-12 NOTE — Telephone Encounter (Signed)
Called and let patient know referral was place, left message on voicemail. Nothing further needed at this time. Will also send a mychart message

## 2020-01-12 NOTE — Telephone Encounter (Signed)
Patient was very upset at Friday's visit with [facility]. She states she doesn't feel like anything is being done to help her. She was asking about a referral Dr. Sherene Sires had mentioned at a past visit to someone who works for [facility]?  I was unsure, but told patient I would send a message to Dr. Sherene Sires to see who this was and see if he could send the referral.  Patient did not want to make a follow up appointment  Patient states she has a "rare condition"  Dr. Sherene Sires please advise on referral for patient

## 2020-01-12 NOTE — Telephone Encounter (Signed)
Refer to Dr Lynelle Doctor at [facility] voice center dx is upper airway cough syndrome

## 2020-08-16 IMAGING — CT CT ANGIO HEAD
1 of 11 series · 5 of 33 positions shown · IV contrast (APPLIED)
Comparison: Prior CT from 07/28/2016.

CLINICAL DATA: Initial evaluation for acute left-sided weakness.

EXAM:
CT ANGIOGRAPHY HEAD AND NECK
TECHNIQUE: Multidetector CT imaging of the head and neck was performed using
the standard protocol during bolus administration of intravenous
contrast. Multiplanar CT image reconstructions and MIPs were
obtained to evaluate the vascular anatomy. Carotid stenosis
measurements (when applicable) are obtained utilizing NASCET
criteria, using the distal internal carotid diameter as the
denominator.
CONTRAST:  75mL OMNIPAQUE IOHEXOL 350 MG/ML SOLN

[Series 12: ax thins · axial · 0.39mm/px · z∈[+865,+1068]mm · 5 of 305 slices shown]
[im 51/305  soft-tissue]
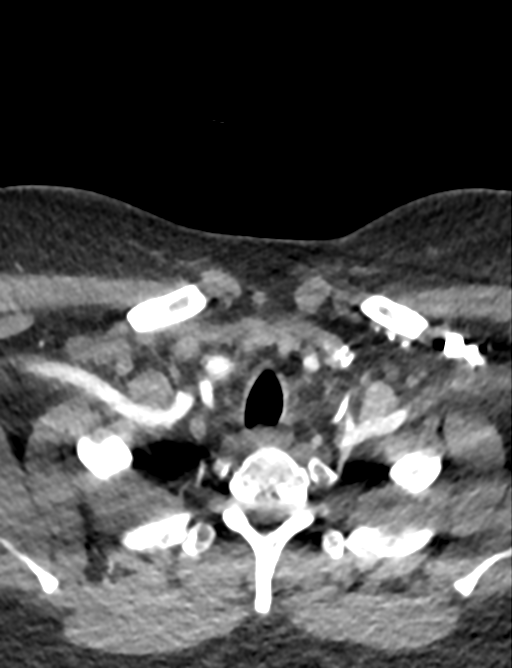
[im 102/305  bone]
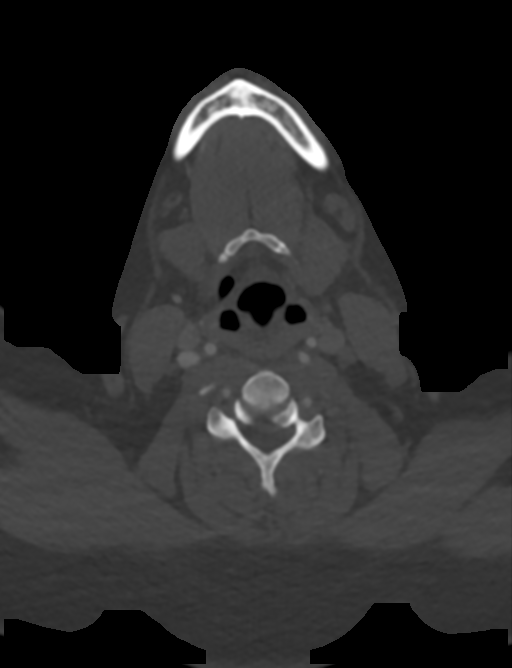
[im 153/305  soft-tissue]
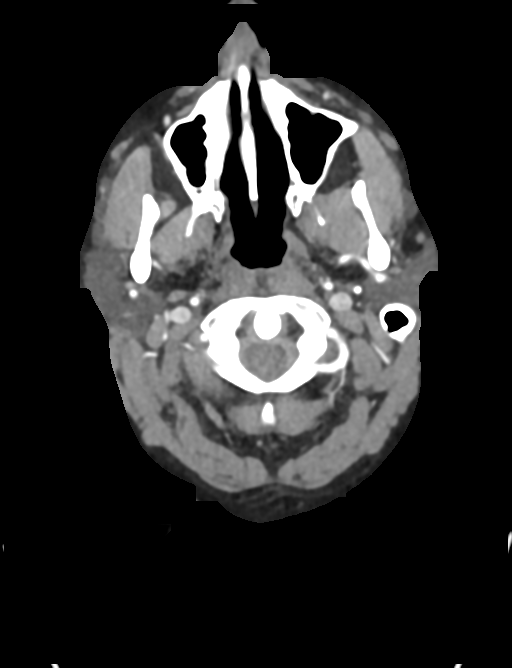
[im 203/305  bone]
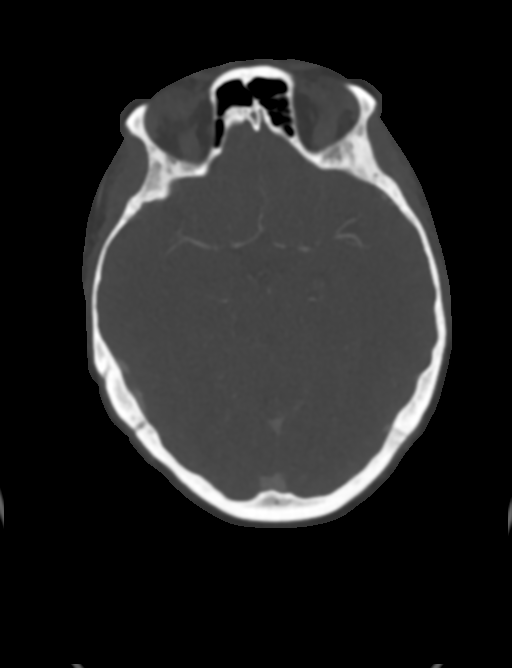
[im 254/305  soft-tissue]
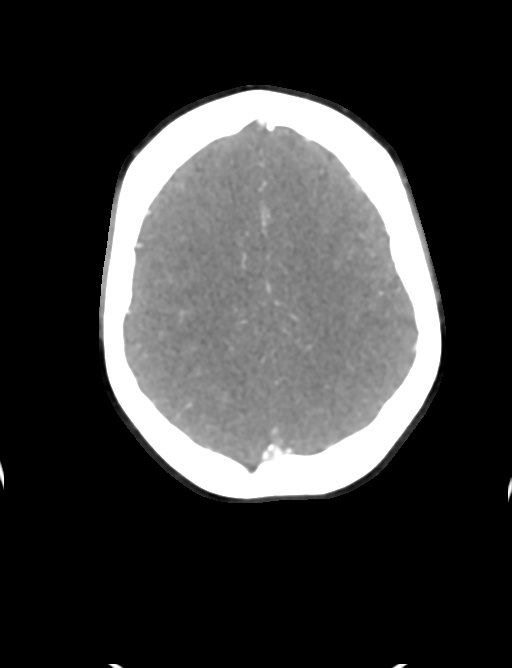

[5 of 33 positions shown; findings below may reference images not displayed]

FINDINGS: CT HEAD FINDINGS

Brain: Cerebral volume within normal limits for patient age.

No evidence for acute intracranial hemorrhage. No findings to
suggest acute large vessel territory infarct. No mass lesion,
midline shift, or mass effect. Ventricles are normal in size without
evidence for hydrocephalus. No extra-axial fluid collection
identified.

Vascular: No hyperdense vessel identified.

Skull: Scalp soft tissues demonstrate no acute abnormality.
Calvarium intact.

Sinuses/Orbits: Globes and orbital soft tissues within normal
limits.

Visualized paranasal sinuses are clear. Trace right mastoid
effusion, of doubtful significance. Left mastoid air cells clear.

CTA NECK FINDINGS

Aortic arch: Visualized aortic arch of normal caliber with normal 3
vessel morphology. No hemodynamically significant stenosis seen
about the origin of the great vessels. Visualized subclavian
arteries widely patent.

Right carotid system: Right common and internal carotid arteries
widely patent without stenosis, dissection or occlusion. No
atheromatous narrowing about the right carotid bifurcation.

Left carotid system: Left common and internal carotid arteries
widely patent without stenosis, dissection or occlusion. No
atheromatous narrowing about the left carotid bifurcation.

Vertebral arteries: Both vertebral arteries arise from the
subclavian arteries. Vertebral arteries widely patent within the
neck without stenosis, dissection or occlusion.

Skeleton: Negative.

Other neck: Unremarkable.

Upper chest: Few mildly prominent paratracheal lymph nodes measure
up to 11 mm, nonspecific, but could be reactive. Patchy multifocal
opacity seen within the right greater than left lungs, concerning
for multifocal infection/pneumonia.

Review of the MIP images confirms the above findings

CTA HEAD FINDINGS

Anterior circulation: Petrous segments widely patent bilaterally.
Cavernous and supraclinoid ICAs widely patent without stenosis.
Saccular aneurysm arising from the paraophthalmic right ICA measures
5 x 4 x 5 mm (series 12, image 108). This is projected superiorly.
Additional saccular in is a arising from the paraophthalmic left ICA
measures 5 x 4 x 5 mm (series 12, image 109). This is also projected
superiorly. ICA termini well perfused. A1 segments patent
bilaterally. Normal anterior communicating artery. Anterior cerebral
arteries widely patent to their distal aspects. No M1 stenosis or
occlusion. Normal MCA bifurcations. Distal MCA branches well
perfused and symmetric.

Posterior circulation: Vertebral arteries widely patent to the
vertebrobasilar junction without stenosis. Left PICA patent. Right
PICA not visualized. Basilar widely patent to its distal aspect
without stenosis. Superior cerebral arteries patent bilaterally.
Both PCAs widely patent to their distal aspects without stenosis.

Venous sinuses: Patent.

Anatomic variants: None significant.

Review of the MIP images confirms the above findings
IMPRESSION: 1. Negative CTA of the head and neck. No large vessel occlusion. No
high-grade or correctable stenosis. No acute intracranial
abnormality.
2. 5 x 4 x 5 mm bilateral paraophthalmic ICA aneurysms.
3. Patchy multifocal opacity within the right greater than left
lungs, concerning for multifocal infection/pneumonia.

## 2021-03-20 ENCOUNTER — Encounter: Payer: Self-pay | Admitting: Emergency Medicine

## 2021-03-20 ENCOUNTER — Other Ambulatory Visit: Payer: Self-pay

## 2021-03-20 ENCOUNTER — Emergency Department (INDEPENDENT_AMBULATORY_CARE_PROVIDER_SITE_OTHER)
Admission: EM | Admit: 2021-03-20 | Discharge: 2021-03-20 | Disposition: A | Discharge status: Home / Self Care | Payer: BC Managed Care – PPO | Source: Home / Self Care

## 2021-03-20 ENCOUNTER — Emergency Department (INDEPENDENT_AMBULATORY_CARE_PROVIDER_SITE_OTHER): Payer: BC Managed Care – PPO

## 2021-03-20 ENCOUNTER — Emergency Department: Admit: 2021-03-20 | Payer: Self-pay

## 2021-03-20 DIAGNOSIS — M25511 Pain in right shoulder: Secondary | ICD-10-CM

## 2021-03-20 DIAGNOSIS — W19XXXA Unspecified fall, initial encounter: Secondary | ICD-10-CM | POA: Diagnosis not present

## 2021-03-20 DIAGNOSIS — M79601 Pain in right arm: Secondary | ICD-10-CM

## 2021-03-20 DIAGNOSIS — M79621 Pain in right upper arm: Secondary | ICD-10-CM

## 2021-03-20 MED ORDER — PREDNISONE 20 MG PO TABS: ORAL_TABLET | ORAL | 0 refills | Status: DC

## 2021-03-20 NOTE — ED Triage Notes (Signed)
Patient presents with right arm due to fall down her stairs on Thursday/4 days ago. She was seen an [facility] for severe pain. Having severe pain of the right arm, shoulder, unable to make a fist. Has been icing the arm, taking Advil, Ibuprofen alternating every 4 hrs.

## 2021-03-20 NOTE — Discharge Instructions (Addendum)
Advised patient to take medication as directed with food to completion. Encouraged patient increase daily water intake while taking this medication.  Orthopedic providers are listed above to follow-up with for further evaluation of right shoulder pain and right upper arm pain.

## 2021-03-20 NOTE — ED Provider Notes (Signed)
[name] CARE    CSN: [id number] Arrival date & time: 03/20/21  1413      History   Chief Complaint Chief Complaint  Patient presents with   Fall    HPI [name] is a 35 y.o. female.   HPI 35 year old female presents with right arm and right shoulder pain for 4 days.  Reports was evaluated at [facility] for severe right shoulder, right upper arm, and right elbow pain.  Reports imaging right elbow was only ordered.  Past Medical History:  Diagnosis Date   Anemia    takes Ferrous Sulfate daily   Anxiety    takes Klonopin nightly   ASTHMA NOS W/ACUTE EXACERBATION 01/05/2010   exercise induced   Back pain    Brain aneurysm    Coarse tremors    Degenerative disc disease    back   Gallstones    GERD (gastroesophageal reflux disease)    but doesn't take anything for it   History of kidney stones    History of migraine    last one a couple of days ago   History of UTI    frequent   IBS (irritable bowel syndrome)    doesn't take any meds   Medullary sponge kidney 2007   kidneys can be sick but she isnt sick   MIGRAINES, HX OF 06/24/2007   Narcolepsy    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   OCD (obsessive compulsive disorder)    takes Prozac daily   PYELONEPHRITIS 06/24/2007   Seizures (HCC)    takes Lamictal daily;last seizure was in 11/14/10   Urinary frequency     Patient Active Problem List   Diagnosis Date Noted   Chronic rhinitis 12/18/2019   GERD (gastroesophageal reflux disease) 05/16/2019   Mild intermittent asthma vs UACS/cyclical coughing  02/08/2019   Abnormal CT of the chest 02/08/2019   DOE (dyspnea on exertion) 02/08/2019   Class 3 severe obesity with body mass index (BMI) of 40.0 to 44.9 in adult (HCC) 08/13/2017   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   Hypersomnia, persistent 08/13/2017   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   S/P laparoscopic cholecystectomy 12/02/2013   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   H/O abnormal  cervical Papanicolaou smear 09/29/2013   OCD (obsessive compulsive disorder)    Convulsions/seizures (HCC) 04/21/2013   Anxiety 04/21/2013   Spinal stenosis, lumbar region, 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   Chronic cough 10/17/2011   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/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   ASTHMA NOS W/ACUTE EXACERBATION 01/05/2010   ABSENCE OF MENSTRUATION 10/27/2009   MIGRAINES, HX OF 06/24/2007    Past Surgical History:  Procedure Laterality Date   BACK SURGERY     CHOLECYSTECTOMY N/A 12/02/2013   Procedure: LAPAROSCOPIC CHOLECYSTECTOMY WITH ATTEMPTED INTRAOPERATIVE CHOLANGIOGRAM;  Surgeon: Atilano Ina, MD;  Location: [facility];  Service: General;  Laterality: N/A;   IR ANGIO INTRA EXTRACRAN SEL INTERNAL CAROTID BILAT MOD SED  12/05/2019   IR US GUIDE VASC ACCESS RIGHT  12/05/2019   LUMBAR LAMINECTOMY/DECOMPRESSION MICRODISCECTOMY  07/24/2012   Procedure: LUMBAR LAMINECTOMY/DECOMPRESSION MICRODISCECTOMY;  Surgeon: Jacki Cones, MD;  Location: [facility];  Service: Orthopedics;  Laterality: Right;  L5-S1   WISDOM TOOTH EXTRACTION      OB History     Gravida  3   Para  2   Term  2   Preterm  0   AB  0   Living  2      SAB  0   IAB  0  Ectopic  0   Multiple  0   Live Births  2            Home Medications    Prior to Admission medications   Medication Sig Start Date End Date Taking? Authorizing Provider  predniSONE (DELTASONE) 20 MG tablet Take 3 tabs PO daily x 3 days, then 2 tabs PO daily x 3 days, then 1 tab PO daily x 3 days 03/20/21  Yes Trevor Iha, FNP  albuterol (VENTOLIN HFA) 108 (90 Base) MCG/ACT inhaler Inhale 2 puffs into the lungs every 4 (four) hours as needed. 03/17/19   [provider]  EPINEPHrine 0.3 mg/0.3 mL IJ SOAJ injection Inject 0.3 mg into the muscle once as needed (for severe allergic reaction).    [provider]  famotidine (PEPCID) 20 MG tablet 1 pill every morning after waking and 1 pill at bedtime every night. 10/17/19    Rachael Fee, MD  montelukast (SINGULAIR) 10 MG tablet One at bedtime 08/29/19   Nyoka Cowden, MD  norgestrel-ethinyl estradiol (CRYSELLE-28) 0.3-30 MG-MCG tablet Take 1 tablet by mouth daily.    [provider]  rivaroxaban (XARELTO) 10 MG TABS tablet Take 20 mg by mouth daily. Patient on starter pack    [provider]  traZODone (DESYREL) 50 MG tablet TAKE 1 TABLET BY MOUTH EVERYDAY AT BEDTIME 02/27/19   Ihor Austin, NP    Family History Family History  Problem Relation Age of Onset   Heart disease Mother    ADD / ADHD Sister    Lung cancer Maternal Grandmother        smoked   Anesthesia problems Neg Hx     Social History Social History   Tobacco Use   Smoking status: Never   Smokeless tobacco: Never  Vaping Use   Vaping Use: Never used  Substance Use Topics   Alcohol use: No    Alcohol/week: 0.0 standard drinks    Comment: occas. when not pregnant   Drug use: No     Allergies   Other, Soy allergy, Gluten meal, Sumatriptan, Zolmitriptan, Ciprofloxacin, Nsaids, Topiramate, Adhesive [tape], and Triptans   Review of Systems Review of Systems  Musculoskeletal:        Right shoulder and right upper arm pain x4 days    Physical Exam Triage Vital Signs ED Triage Vitals  Enc Vitals Group     BP 03/20/21 1514 (!) 130/91     Pulse Rate 03/20/21 1514 79     Resp 03/20/21 1514 18     Temp 03/20/21 1514 98.2 F (36.8 C)     Temp Source 03/20/21 1514 Oral     SpO2 03/20/21 1514 100 %     Weight --      Height --      Head Circumference --      Peak Flow --      Pain Score 03/20/21 1512 10     Pain Loc --      Pain Edu? --      Excl. in GC? --    No data found.  Updated Vital Signs BP (!) 130/91 (BP Location: Left Arm)   Pulse 79   Temp 98.2 F (36.8 C) (Oral)   Resp 18   SpO2 100%   Physical Exam Constitutional:      General: She is not in acute distress.    Appearance: Normal appearance. She is obese. She is not ill-appearing.   HENT:  Head: Normocephalic and atraumatic.     Mouth/Throat:     Mouth: Mucous membranes are moist.     Pharynx: Oropharynx is clear.  Eyes:     Extraocular Movements: Extraocular movements intact.     Conjunctiva/sclera: Conjunctivae normal.     Pupils: Pupils are equal, round, and reactive to light.  Cardiovascular:     Rate and Rhythm: Normal rate and regular rhythm.     Pulses: Normal pulses.     Heart sounds: Normal heart sounds.  Pulmonary:     Effort: Pulmonary effort is normal.     Breath sounds: Normal breath sounds.     Comments: No adventitious breath sounds noted Musculoskeletal:     Comments: Right shoulder/right upper arm: Exam limited today due to pain and currently has right shoulder and right upper arm wrapped, with shoulder immobilizer on as well.  Skin:    General: Skin is warm and dry.  Neurological:     General: No focal deficit present.     Mental Status: She is alert and oriented to person, place, and time.  Psychiatric:        Mood and Affect: Mood normal.        Behavior: Behavior normal.     UC Treatments / Results  Labs (all labs ordered are listed, but only abnormal results are displayed) Labs Reviewed - No data to display  EKG   Radiology DG Shoulder Right  Result Date: 03/20/2021 CLINICAL DATA:  Right shoulder pain.  Fall 4 days ago. EXAM: RIGHT SHOULDER - 2+ VIEW COMPARISON:  None. FINDINGS: There is no evidence of fracture or dislocation. There is no evidence of arthropathy or other focal bone abnormality. Soft tissues are unremarkable. IMPRESSION: Negative. Electronically Signed   By: Gerome Sam III M.D   On: 03/20/2021 16:32   DG Humerus Right  Result Date: 03/20/2021 CLINICAL DATA:  Pain after fall 4 days ago EXAM: RIGHT HUMERUS - 2+ VIEW COMPARISON:  None. FINDINGS: There is no evidence of fracture or other focal bone lesions. Soft tissues are unremarkable. IMPRESSION: Negative. Electronically Signed   By: Gerome Sam III  M.D   On: 03/20/2021 16:33    Procedures Procedures (including critical care time)  Medications Ordered in UC Medications - No data to display  Initial Impression / Assessment and Plan / UC Course  I have reviewed the triage vital signs and the nursing notes.  Pertinent labs & imaging results that were available during my care of the patient were reviewed by me and considered in my medical decision making (see chart for details).     MDM: 1. Fall, 2. Right shoulder pain, 3. Right upper pain.  Patient discharged home, hemodynamically stable. Final Clinical Impressions(s) / UC Diagnoses   Final diagnoses:  Fall, initial encounter  Acute pain of right shoulder  Pain in right upper arm     Discharge Instructions      Advised patient to take medication as directed with food to completion. Encouraged patient increase daily water intake while taking this medication.  Orthopedic providers are listed above to follow-up with for further evaluation of right shoulder pain and right upper arm pain.     ED Prescriptions     Medication Sig Dispense Auth. Provider   predniSONE (DELTASONE) 20 MG tablet Take 3 tabs PO daily x 3 days, then 2 tabs PO daily x 3 days, then 1 tab PO daily x 3 days 18 tablet Trevor Iha, FNP  PDMP not reviewed this encounter.   Trevor IhaRagan, Peter Keyworth, FNP 03/20/21 1706

## 2021-03-22 ENCOUNTER — Telehealth: Payer: Self-pay | Admitting: Emergency Medicine

## 2021-03-22 NOTE — Telephone Encounter (Signed)
Call back to [facility] regarding prednisone prescription from Sunday - pt had left a message stating medicine had not been received at pharmacy- receipt had been confirmed by pharmacy on 03/20/21 @ 1642. call to pharmacy at 1025- pharmacist confirmed receipt but stated they did not have 20mg , currently on national back order. RN spoke w/ Dr. - okay to substitute w/ 10mg  tablet. Call back to [facility] to update on prescription status- pt verbalized an understanding that she would be receiving 10mg  tablets vs 20mg , so she would have to take 2 ten mg tablets

## 2022-01-17 ENCOUNTER — Other Ambulatory Visit: Payer: Self-pay | Admitting: Orthopaedic Surgery

## 2022-01-17 ENCOUNTER — Ambulatory Visit
Admission: RE | Admit: 2022-01-17 | Discharge: 2022-01-17 | Disposition: A | Discharge status: Home / Self Care | Payer: BC Managed Care – PPO | Source: Ambulatory Visit | Attending: Orthopaedic Surgery | Admitting: Orthopaedic Surgery

## 2022-01-17 DIAGNOSIS — M79671 Pain in right foot: Secondary | ICD-10-CM

## 2022-11-16 ENCOUNTER — Ambulatory Visit (HOSPITAL_COMMUNITY): Payer: BC Managed Care – PPO

## 2022-11-16 ENCOUNTER — Ambulatory Visit
Admission: EM | Admit: 2022-11-16 | Discharge: 2022-11-16 | Disposition: A | Discharge status: Home / Self Care | Payer: BC Managed Care – PPO | Attending: Urgent Care | Admitting: Urgent Care

## 2022-11-16 ENCOUNTER — Ambulatory Visit (INDEPENDENT_AMBULATORY_CARE_PROVIDER_SITE_OTHER): Payer: BC Managed Care – PPO

## 2022-11-16 DIAGNOSIS — M25522 Pain in left elbow: Secondary | ICD-10-CM

## 2022-11-16 DIAGNOSIS — S46912A Strain of unspecified muscle, fascia and tendon at shoulder and upper arm level, left arm, initial encounter: Secondary | ICD-10-CM

## 2022-11-16 DIAGNOSIS — W19XXXA Unspecified fall, initial encounter: Secondary | ICD-10-CM

## 2022-11-16 DIAGNOSIS — M25512 Pain in left shoulder: Secondary | ICD-10-CM

## 2022-11-16 MED ORDER — PREDNISONE 20 MG PO TABS: ORAL_TABLET | ORAL | 0 refills | Status: AC
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
## 2023-01-07 ENCOUNTER — Encounter (HOSPITAL_BASED_OUTPATIENT_CLINIC_OR_DEPARTMENT_OTHER): Payer: Self-pay

## 2023-01-07 ENCOUNTER — Other Ambulatory Visit: Payer: Self-pay

## 2023-01-07 ENCOUNTER — Emergency Department (HOSPITAL_BASED_OUTPATIENT_CLINIC_OR_DEPARTMENT_OTHER): Payer: BC Managed Care – PPO

## 2023-01-07 ENCOUNTER — Emergency Department (HOSPITAL_BASED_OUTPATIENT_CLINIC_OR_DEPARTMENT_OTHER)
Admission: EM | Admit: 2023-01-07 | Discharge: 2023-01-07 | Disposition: A | Discharge status: Home / Self Care | Payer: BC Managed Care – PPO | Attending: Emergency Medicine | Admitting: Emergency Medicine

## 2023-01-07 DIAGNOSIS — R109 Unspecified abdominal pain: Secondary | ICD-10-CM | POA: Diagnosis present

## 2023-01-07 LAB — CBC
HCT: 38.7 % (ref 36.0–46.0)
Hemoglobin: 12.3 g/dL (ref 12.0–15.0)
MCH: 27 pg (ref 26.0–34.0)
MCHC: 31.8 g/dL (ref 30.0–36.0)
MCV: 85.1 fL (ref 80.0–100.0)
Platelets: 348 10*3/uL (ref 150–400)
RBC: 4.55 MIL/uL (ref 3.87–5.11)
RDW: 12.5 % (ref 11.5–15.5)
WBC: 8.8 10*3/uL (ref 4.0–10.5)
nRBC: 0 % (ref 0.0–0.2)

## 2023-01-07 LAB — COMPREHENSIVE METABOLIC PANEL
ALT: 8 U/L (ref 0–44)
AST: 11 U/L — ABNORMAL LOW (ref 15–41)
Albumin: 4.1 g/dL (ref 3.5–5.0)
Alkaline Phosphatase: 66 U/L (ref 38–126)
Anion gap: 9 (ref 5–15)
BUN: 12 mg/dL (ref 6–20)
CO2: 23 mmol/L (ref 22–32)
Calcium: 9.4 mg/dL (ref 8.9–10.3)
Chloride: 104 mmol/L (ref 98–111)
Creatinine, Ser: 0.76 mg/dL (ref 0.44–1.00)
GFR, Estimated: >60 mL/min (ref >60)
Glucose, Bld: 95 mg/dL (ref 70–99)
Potassium: 3.8 mmol/L (ref 3.5–5.1)
Sodium: 136 mmol/L (ref 135–145)
Total Bilirubin: 0.3 mg/dL (ref 0.3–1.2)
Total Protein: 7.2 g/dL (ref 6.5–8.1)

## 2023-01-07 LAB — URINALYSIS, ROUTINE W REFLEX MICROSCOPIC
Bilirubin Urine: NEGATIVE
Glucose, UA: NEGATIVE mg/dL
Hgb urine dipstick: NEGATIVE
Ketones, ur: NEGATIVE mg/dL
Leukocytes,Ua: NEGATIVE
Nitrite: NEGATIVE
Specific Gravity, Urine: 1.044 — ABNORMAL HIGH (ref 1.005–1.030)
pH: 5 (ref 5.0–8.0)

## 2023-01-07 LAB — HCG, SERUM, QUALITATIVE: Preg, Serum: NEGATIVE

## 2023-01-07 MED ORDER — SODIUM CHLORIDE 0.9 % IV BOLUS
1000.0000 mL | Freq: Once | INTRAVENOUS | Status: AC
  Administered 2023-01-07: 1000 mL via INTRAVENOUS

## 2023-01-07 MED ORDER — HYDROCODONE-ACETAMINOPHEN 5-325 MG PO TABS: 1.0000 | ORAL_TABLET | Freq: Four times a day (QID) | ORAL | 0 refills | Status: AC | PRN

## 2023-01-07 MED ORDER — ONDANSETRON HCL 4 MG/2ML IJ SOLN
4.0000 mg | Freq: Once | INTRAMUSCULAR | Status: AC
  Administered 2023-01-07: 4 mg via INTRAVENOUS
  Filled 2023-01-07: qty 2

## 2023-01-07 MED ORDER — HYDROMORPHONE HCL 1 MG/ML IJ SOLN
0.5000 mg | Freq: Once | INTRAMUSCULAR | Status: AC
  Administered 2023-01-07: 0.5 mg via INTRAVENOUS
  Filled 2023-01-07: qty 1

## 2023-01-07 MED ORDER — IOHEXOL 300 MG/ML  SOLN
100.0000 mL | Freq: Once | INTRAMUSCULAR | Status: AC | PRN
  Administered 2023-01-07: 80 mL via INTRAVENOUS

## 2023-01-07 NOTE — ED Provider Notes (Signed)
[place] EMERGENCY DEPARTMENT AT [facility] Provider Note   CSN: [id number] Arrival date & time: 01/07/23  1734     History  Chief Complaint  Patient presents with   Flank Pain    [name] is a 37 y.o. female.  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  Symptoms started several days ago.  Patient followed up with her PCP who started her on treatment for pyelonephritis with IM Rocephin and oral amoxicillin.  She states that her symptoms have persisted despite treatment.  She has had nausea without vomiting.  She has had temperatures as high as 101 F at home.  No diarrhea.  No dysuria or hematuria.  She has had pyelonephritis in the past.       Home Medications Prior to Admission medications   Medication Sig Start Date End Date Taking? Authorizing Provider  albuterol (VENTOLIN HFA) 108 (90 Base) MCG/ACT inhaler Inhale 2 puffs into the lungs every 4 (four) hours as needed. 03/17/19   [provider]  EPINEPHrine 0.3 mg/0.3 mL IJ SOAJ injection Inject 0.3 mg into the muscle once as needed (for severe allergic reaction).    [provider]  famotidine (PEPCID) 20 MG tablet 1 pill every morning after waking and 1 pill at bedtime every night. 10/17/19   Rachael Fee, MD  montelukast (SINGULAIR) 10 MG tablet One at bedtime 08/29/19   Nyoka Cowden, MD  norgestrel-ethinyl estradiol (CRYSELLE-28) 0.3-30 MG-MCG tablet Take 1 tablet by mouth daily.    [provider]  predniSONE (DELTASONE) 20 MG tablet Take 2 tablets daily with breakfast. 11/16/22   Wallis Bamberg, PA-C  rivaroxaban (XARELTO) 10 MG TABS tablet Take 20 mg by mouth daily. Patient on starter pack    [provider]  traZODone (DESYREL) 50 MG tablet TAKE 1 TABLET BY MOUTH EVERYDAY AT BEDTIME 02/27/19   Ihor Austin, NP      Allergies     Other, Soy allergy, Gluten meal, Sumatriptan, Zolmitriptan, Ciprofloxacin, Nsaids, Topiramate, Adhesive [tape], and Triptans    Review of Systems   Review of Systems  Physical Exam Updated Vital Signs BP 108/68 (BP Location: Right Arm)   Pulse 85   Temp 98.2 F (36.8 C) (Oral)   Resp 18   Ht 5' (1.524 m)   Wt 99.8 kg   SpO2 100%   BMI 42.97 kg/m  Physical Exam Vitals and nursing note reviewed.  Constitutional:      General: She is not in acute distress.    Appearance: She is well-developed.  HENT:     Head: Normocephalic and atraumatic.     Right Ear: External ear normal.     Left Ear: External ear normal.     Nose: Nose normal.  Eyes:     Conjunctiva/sclera: Conjunctivae normal.  Cardiovascular:     Rate and Rhythm: Normal rate and regular rhythm.     Heart sounds: No murmur heard. Pulmonary:     Effort: No respiratory distress.     Breath sounds: No wheezing, rhonchi or rales.  Abdominal:     Palpations: Abdomen is soft.     Tenderness: There is abdominal tenderness. There is right CVA tenderness. There is no guarding or rebound.     Comments: Right lateral abdominal tenderness  Musculoskeletal:     Cervical back: Normal range of motion and neck supple.     Right  lower leg: No edema.     Left lower leg: No edema.  Skin:    General: Skin is warm and dry.     Findings: No rash.  Neurological:     General: No focal deficit present.     Mental Status: She is alert. Mental status is at baseline.     Motor: No weakness.  Psychiatric:        Mood and Affect: Mood normal.     ED Results / Procedures / Treatments   Labs (all labs ordered are listed, but only abnormal results are displayed) Labs Reviewed  COMPREHENSIVE METABOLIC PANEL - Abnormal; Notable for the following components:      Result Value   AST 11 (*)    All other components within normal limits  URINALYSIS, ROUTINE W REFLEX MICROSCOPIC - Abnormal; Notable for the following components:   Specific  Gravity, Urine 1.044 (*)    Protein, ur TRACE (*)    All other components within normal limits  CBC  HCG, SERUM, QUALITATIVE    EKG None  Radiology CT ABDOMEN PELVIS W CONTRAST  Result Date: 01/07/2023 CLINICAL DATA:  Abdominal/flank pain EXAM: CT ABDOMEN AND PELVIS WITH CONTRAST TECHNIQUE: Multidetector CT imaging of the abdomen and pelvis was performed using the standard protocol following bolus administration of intravenous contrast. RADIATION DOSE REDUCTION: This exam was performed according to the departmental dose-optimization program which includes automated exposure control, adjustment of the mA and/or kV according to patient size and/or use of iterative reconstruction technique. CONTRAST:  80mL OMNIPAQUE IOHEXOL 300 MG/ML  SOLN COMPARISON:  CT renal stone protocol Jan 26, 2018 FINDINGS: Lower chest: The visualized bibasilar lungs clear. Normal heart size no pericardial effusion. Hepatobiliary: No focal liver abnormality is seen. Status post cholecystectomy. No biliary dilatation. Pancreas: Unremarkable. No pancreatic ductal dilatation or surrounding inflammatory changes. Spleen: Normal in size without focal abnormality. Adrenals/Urinary Tract: Adrenal glands are unremarkable. Symmetric enhancement of bilateral kidneys no hydronephrosis. No nephrolithiasis. Mild circumferential bladder wall thickening without pericystic fat stranding. Stomach/Bowel: Small hiatal hernia. Appendix appears normal. No evidence of bowel wall thickening, distention, or inflammatory changes. Vascular/Lymphatic: No significant vascular findings are present. No enlarged abdominal or pelvic lymph nodes. Reproductive: Uterus and bilateral adnexa are unremarkable. Other: No abdominal wall hernia or abnormality. No abdominopelvic ascites. Musculoskeletal: No acute or significant osseous findings. IMPRESSION: 1. No definite etiology for abdominal pain is identified. Specifically, no nephrolithiasis or hydronephrosis. 2. Mild  circumferential bladder wall thickening without surrounding fat stranding, likely due to under-distention. However, recommend correlation with urinalysis to exclude urinary tract infection Electronically Signed   By: Jacob Moores M.D.   On: 01/07/2023 19:16    Procedures Procedures    Medications Ordered in ED Medications  HYDROmorphone (DILAUDID) injection 0.5 mg (has no administration in time range)  ondansetron (ZOFRAN) injection 4 mg (has no administration in time range)  sodium chloride 0.9 % bolus 1,000 mL (has no administration in time range)    ED Course/ Medical Decision Making/ A&P    Patient seen and examined. History obtained directly from patient.  I also reviewed recent outpatient PCP notes.  Labs/EKG: Ordered CBC, CMP, UA, urine pregnancy   Imaging: Ordered CT abdomen and pelvis with contrast to evaluate for possibility of pyelonephritis in setting of obstructive kidney stone.  Medications/Fluids: Ordered: IV Dilaudid IV Zofran IV fluid bolus.   Most recent vital signs reviewed and are as follows: BP 108/68 (BP Location: Right Arm)   Pulse 85   Temp  98.2 F (36.8 C) (Oral)   Resp 18   Ht 5' (1.524 m)   Wt 99.8 kg   SpO2 100%   BMI 42.97 kg/m   Initial impression: Right-sided flank pain  8:49 PM Reassessment performed. Patient appears much more comfortable.  She received good relief with IV pain medication.  IV fluids completed.  Labs personally reviewed and interpreted including: CBC unremarkable; CMP unremarkable; UA concentrated but without signs of infection currently; pregnancy negative.  Imaging personally visualized and interpreted including: CT abdomen pelvis, agree no acute findings.  Reviewed pertinent lab work and imaging with patient at bedside. Questions answered.   Most current vital signs reviewed and are as follows: BP 108/68 (BP Location: Right Arm)   Pulse 85   Temp 98.2 F (36.8 C) (Oral)   Resp 18   Ht 5' (1.524 m)   Wt 99.8  kg   SpO2 100%   BMI 42.97 kg/m   Plan: Discharge to home  Prescriptions written for: Vicodin # 6 tablets  Patient counseled on use of narcotic pain medications. Counseled not to combine these medications with others containing tylenol. Urged not to drink alcohol, drive, or perform any other activities that requires focus while taking these medications. The patient verbalizes understanding and agrees with the plan.  Other home care instructions discussed: Continue rest, hydration, continue antibiotics as prescribed by PCP  ED return instructions discussed: The patient was urged to return to the Emergency Department immediately with worsening of current symptoms, worsening abdominal pain, persistent vomiting, blood noted in stools, fever, or any other concerns. The patient verbalized understanding.   Follow-up instructions discussed: Patient encouraged to follow-up with their PCP in 2-3 days.                                Medical Decision Making Amount and/or Complexity of Data Reviewed Labs: ordered. Radiology: ordered.  Risk Prescription drug management.   For this patient's complaint of abdominal pain, the following conditions were considered on the differential diagnosis: gastritis/PUD, enteritis/duodenitis, appendicitis, cholelithiasis/cholecystitis, cholangitis, pancreatitis, ruptured viscus, colitis, diverticulitis, small/large bowel obstruction, proctitis, cystitis, pyelonephritis, ureteral colic, aortic dissection, aortic aneurysm. In women, ectopic pregnancy, pelvic inflammatory disease, ovarian cysts, and tubo-ovarian abscess were also considered. Atypical chest etiologies were also considered including ACS, PE, and pneumonia.  There was question of pyelonephritis based on PCP exam as outpatient.  Patient was initiated on treatment for this.  Urine appears clear so possible that her infection is being treated, but given ongoing symptoms, will have patient continue full  course of treatment.  CT does not demonstrate obstructive stone complicating pyelonephritis.  Does not show other cause of pain.  The patient's vital signs, pertinent lab work and imaging were reviewed and interpreted as discussed in the ED course. Hospitalization was considered for further testing, treatments, or serial exams/observation. However as patient is well-appearing, has a stable exam, and reassuring studies today, I do not feel that they warrant admission at this time. This plan was discussed with the patient who verbalizes agreement and comfort with this plan and seems reliable and able to return to the Emergency Department with worsening or changing symptoms.          Final Clinical Impression(s) / ED Diagnoses Final diagnoses:  Flank pain    Rx / DC Orders ED Discharge Orders          Ordered    HYDROcodone-acetaminophen (NORCO/VICODIN) 5-325 MG tablet  Every 6 hours PRN        01/07/23 2048              Renne Crigler, Cordelia Poche 01/07/23 2052    Glyn Ade, MD 01/07/23 2257

## 2023-01-07 NOTE — ED Triage Notes (Signed)
Patient here POV from Home.  Endorses Right Flank Pain for approximately 1 Week. Went to PCP 2 Days ago and was told she had a UTI and was given IM Injection of Antibiotic and began taking Amoxicillin  Intermittent Fever (101 Highest). Frequency. Some nausea. No Emesis. No Diarrhea.   NAD Noted during triage. A&Ox4. GCS 15. Ambulatory.

## 2023-01-07 NOTE — Discharge Instructions (Signed)
Please read and follow all provided instructions.  Your diagnoses today include:  1. Flank pain     Tests performed today include: Blood cell counts and platelets Kidney and liver function tests Urine test to look for infection: No sign of infection today, does suggest some dehydration A blood or urine test for pregnancy (women only) CT scan of the abdomen pelvis does not show any concerning infections, kidney stones or other problems Vital signs. See below for your results today.   Medications prescribed:  Vicodin (hydrocodone/acetaminophen) - narcotic pain medication  DO NOT drive or perform any activities that require you to be awake and alert because this medicine can make you drowsy. BE VERY CAREFUL not to take multiple medicines containing Tylenol (also called acetaminophen). Doing so can lead to an overdose which can damage your liver and cause liver failure and possibly death.  Take any prescribed medications only as directed.  Home care instructions:  Follow any educational materials contained in this packet. Continue medications prescribed by your doctor  Follow-up instructions: Please follow-up with your primary care provider in the next 2-3 days for further evaluation of your symptoms.    Return instructions:  SEEK IMMEDIATE MEDICAL ATTENTION IF: The pain does not go away or becomes severe  A temperature above 101F develops  Repeated vomiting occurs (multiple episodes)  The pain becomes localized to portions of the abdomen. The right side could possibly be appendicitis. In an adult, the left lower portion of the abdomen could be colitis or diverticulitis.  Blood is being passed in stools or vomit (bright red or black tarry stools)  You develop chest pain, difficulty breathing, dizziness or fainting, or become confused, poorly responsive, or inconsolable (young children) If you have any other emergent concerns regarding your health  Additional Information: Abdominal  (belly) pain can be caused by many things. Your caregiver performed an examination and possibly ordered blood/urine tests and imaging (CT scan, x-rays, ultrasound). Many cases can be observed and treated at home after initial evaluation in the emergency department. Even though you are being discharged home, abdominal pain can be unpredictable. Therefore, you need a repeated exam if your pain does not resolve, returns, or worsens. Most patients with abdominal pain don't have to be admitted to the hospital or have surgery, but serious problems like appendicitis and gallbladder attacks can start out as nonspecific pain. Many abdominal conditions cannot be diagnosed in one visit, so follow-up evaluations are very important.  Your vital signs today were: BP 108/68 (BP Location: Right Arm)   Pulse 85   Temp 98.2 F (36.8 C) (Oral)   Resp 18   Ht 5' (1.524 m)   Wt 99.8 kg   SpO2 100%   BMI 42.97 kg/m  If your blood pressure (bp) was elevated above 135/85 this visit, please have this repeated by your doctor within one month. --------------

## 2023-02-16 ENCOUNTER — Ambulatory Visit
Admission: EM | Admit: 2023-02-16 | Discharge: 2023-02-16 | Disposition: A | Discharge status: Home / Self Care | Payer: BC Managed Care – PPO | Attending: Family Medicine | Admitting: Family Medicine

## 2023-02-16 DIAGNOSIS — M79631 Pain in right forearm: Secondary | ICD-10-CM | POA: Diagnosis not present

## 2023-02-16 DIAGNOSIS — M79632 Pain in left forearm: Secondary | ICD-10-CM

## 2023-02-16 MED ORDER — INDOMETHACIN 25 MG PO CAPS: 25.0000 mg | ORAL_CAPSULE | Freq: Two times a day (BID) | ORAL | 0 refills | Status: AC

## 2023-02-16 NOTE — ED Triage Notes (Signed)
Pt reports she has a spot that has appeared on her right arm since earlier today. . It hurts the bone and hers to touch, states pt.

## 2023-02-16 NOTE — Discharge Instructions (Signed)
Recommend warm compresses and wearing Ace wrap with all activity to prevent hitting arm and reinjuring.  That is inflamed.  Start indomethacin take 1 tablet twice daily for the next 3 days or so if symptoms completely resolved no additional evaluation is warranted however if symptoms resolve and return I would like for you to follow-up at [facility].  If there is no improvement and no change to the area and you continue to have pain recommend evaluation at the emergency department given your history of blood clot.

## 2023-02-16 NOTE — ED Provider Notes (Signed)
[facility]    CSN: [id number] Arrival date & time: 02/16/23  1606      History   Chief Complaint Chief Complaint  Patient presents with   Arm Injury    Not an injury. I have a lump on my right forearm that was present three weeks ago and then went away. However it is reappeared today and is really sore and when touched the bone hurts as well. - Entered by patient    HPI [name] is a 37 y.o. female.   HPI Patient present today for a bruised lumpy area that returned on her right forearm that previously developed 3 weeks ago subsequently returned today without injury.  Patient reports a history of a blood clot involving the left arm back in 2021 or 2022.  She was briefly treated with a blood thinner but she is no longer currently taking any type of blood thinners.  Concerned that the area on her forearm could potentially be a blood clot.  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  He has not taken any medication to try to alleviate the pain.  Previous lumpy area that appeared at the same site a couple weeks ago resolved without any intervention and was not as painful as it is today.  Past Medical History:  Diagnosis Date   Anemia    takes Ferrous Sulfate daily   Anxiety    takes Klonopin nightly   ASTHMA NOS W/ACUTE EXACERBATION 01/05/2010   exercise induced   Back pain    Brain aneurysm    Coarse tremors    Degenerative disc disease    back   Gallstones    GERD (gastroesophageal reflux disease)    but doesn't take anything for it   History of kidney stones    History of migraine    last one a couple of days ago   History of UTI    frequent   IBS (irritable bowel syndrome)    doesn't take any meds   Medullary sponge kidney 2007   kidneys can be sick but she isnt sick   MIGRAINES, HX OF 06/24/2007   Narcolepsy    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   OCD  (obsessive compulsive disorder)    takes Prozac daily   PYELONEPHRITIS 06/24/2007   Seizures (HCC)    takes Lamictal daily;last seizure was in 11/14/10   Urinary frequency     Patient Active Problem List   Diagnosis Date Noted   Chronic rhinitis 12/18/2019   GERD (gastroesophageal reflux disease) 05/16/2019   Mild intermittent asthma vs UACS/cyclical coughing  02/08/2019   Abnormal CT of the chest 02/08/2019   DOE (dyspnea on exertion) 02/08/2019   Class 3 severe obesity with body mass index (BMI) of 40.0 to 44.9 in adult (HCC) 08/13/2017   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   Hypersomnia, persistent 08/13/2017   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   S/P laparoscopic cholecystectomy 12/02/2013   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   H/O abnormal cervical Papanicolaou smear 09/29/2013   OCD (obsessive compulsive disorder)    Convulsions/seizures (HCC) 04/21/2013   Anxiety 04/21/2013   Spinal stenosis, lumbar region, 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   Chronic cough 10/17/2011   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/27/2010   ASTHMA NOS W/ACUTE EXACERBATION 01/05/2010   ABSENCE OF MENSTRUATION 10/27/2009   MIGRAINES, HX OF 06/24/2007    Past Surgical History:  Procedure Laterality Date   BACK SURGERY     CHOLECYSTECTOMY N/A 12/02/2013   Procedure: LAPAROSCOPIC CHOLECYSTECTOMY WITH ATTEMPTED INTRAOPERATIVE CHOLANGIOGRAM;  Surgeon: Atilano Ina, MD;  Location: [facility] OR;  Service: General;  Laterality: N/A;   IR ANGIO INTRA EXTRACRAN SEL INTERNAL CAROTID BILAT MOD SED  12/05/2019   IR US GUIDE VASC ACCESS RIGHT  12/05/2019   LUMBAR LAMINECTOMY/DECOMPRESSION MICRODISCECTOMY  07/24/2012   Procedure: LUMBAR LAMINECTOMY/DECOMPRESSION MICRODISCECTOMY;  Surgeon: Jacki Cones, MD;  Location: [facility] ORS;  Service: Orthopedics;  Laterality: Right;  L5-S1   WISDOM TOOTH EXTRACTION      OB History     Gravida  3   Para  2   Term  2   Preterm  0   AB  0   Living   2      SAB  0   IAB  0   Ectopic  0   Multiple  0   Live Births  2            Home Medications    Prior to Admission medications   Medication Sig Start Date End Date Taking? Authorizing Provider  albuterol (VENTOLIN HFA) 108 (90 Base) MCG/ACT inhaler Inhale 2 puffs into the lungs every 4 (four) hours as needed. 03/17/19   [provider]  EPINEPHrine 0.3 mg/0.3 mL IJ SOAJ injection Inject 0.3 mg into the muscle once as needed (for severe allergic reaction).    [provider]  famotidine (PEPCID) 20 MG tablet 1 pill every morning after waking and 1 pill at bedtime every night. 10/17/19   Rachael Fee, MD  HYDROcodone-acetaminophen (NORCO/VICODIN) 5-325 MG tablet Take 1 tablet by mouth every 6 (six) hours as needed for severe pain. 01/07/23   Renne Crigler, PA-C  indomethacin (INDOCIN) 25 MG capsule Take 1 capsule (25 mg total) by mouth 2 (two) times daily with a meal for 5 days. 02/16/23 02/21/23 Yes Bing Neighbors, NP  montelukast (SINGULAIR) 10 MG tablet One at bedtime 08/29/19   Nyoka Cowden, MD  norgestrel-ethinyl estradiol (CRYSELLE-28) 0.3-30 MG-MCG tablet Take 1 tablet by mouth daily.    [provider]  predniSONE (DELTASONE) 20 MG tablet Take 2 tablets daily with breakfast. 11/16/22   Wallis Bamberg, PA-C  rivaroxaban (XARELTO) 10 MG TABS tablet Take 20 mg by mouth daily. Patient on starter pack    [provider]  traZODone (DESYREL) 50 MG tablet TAKE 1 TABLET BY MOUTH EVERYDAY AT BEDTIME 02/27/19   Ihor Austin, NP    Family History Family History  Problem Relation Age of Onset   Heart disease Mother    ADD / ADHD Sister    Lung cancer Maternal Grandmother        smoked   Anesthesia problems Neg Hx     Social History Social History   Tobacco Use   Smoking status: Never   Smokeless tobacco: Never  Vaping Use   Vaping Use: Never used  Substance Use Topics   Alcohol use: Yes    Comment: occ   Drug use: No      Allergies   Other, Soy allergy, Gluten meal, Sumatriptan, Zolmitriptan, Ciprofloxacin, Nsaids, Topiramate, Adhesive [tape], and Triptans   Review of Systems Review of Systems Pertinent negatives listed in HPI   Physical Exam Triage Vital Signs ED Triage Vitals [02/16/23 1642]  Enc Vitals Group     BP 128/81     Pulse Rate 98     Resp 20  Temp 97.9 F (36.6 C)     Temp Source Oral     SpO2 98 %     Weight      Height      Head Circumference      Peak Flow      Pain Score 1     Pain Loc      Pain Edu?      Excl. in GC?    No data found.  Updated Vital Signs BP 128/81 (BP Location: Left Arm)   Pulse 98   Temp 97.9 F (36.6 C) (Oral)   Resp 20   LMP 02/10/2023 (Approximate)   SpO2 98%   Visual Acuity Right Eye Distance:   Left Eye Distance:   Bilateral Distance:    Right Eye Near:   Left Eye Near:    Bilateral Near:     Physical Exam Constitutional:      Appearance: Normal appearance.  HENT:     Head: Normocephalic and atraumatic.  Cardiovascular:     Rate and Rhythm: Normal rate and regular rhythm.  Pulmonary:     Effort: Pulmonary effort is normal.     Breath sounds: Normal breath sounds.  Musculoskeletal:       Arms:  Neurological:     Mental Status: She is alert.  Psychiatric:        Behavior: Behavior is cooperative.      UC Treatments / Results  Labs (all labs ordered are listed, but only abnormal results are displayed) Labs Reviewed - No data to display  EKG   Radiology No results found.  Procedures Procedures (including critical care time)  Medications Ordered in UC Medications - No data to display  Initial Impression / Assessment and Plan / UC Course  I have reviewed the triage vital signs and the nursing notes.  Pertinent labs & imaging results that were available during my care of the patient were reviewed by me and considered in my medical decision making (see chart for details).    Right forearm pain, will  try brief course of indomethacin 25 mg twice daily for up to 5 days as needed.  Advised patient that if her symptoms do not improve within 48 hours I would recommend going immediately to the emergency department given her history of blood clot back in 2021.  Also encouraged warm compresses to reduce swelling.  Ace wrap applied to arm that reinjury.  Verbalized understanding and agreement with plan. Final diagnoses:  Right forearm pain     Discharge Instructions      Recommend warm compresses and wearing Ace wrap with all activity to prevent hitting arm and reinjuring.  That is inflamed.  Start indomethacin take 1 tablet twice daily for the next 3 days or so if symptoms completely resolved no additional evaluation is warranted however if symptoms resolve and return I would like for you to follow-up at [facility].  If there is no improvement and no change to the area and you continue to have pain recommend evaluation at the emergency department given your history of blood clot.     ED Prescriptions     Medication Sig Dispense Auth. Provider   indomethacin (INDOCIN) 25 MG capsule Take 1 capsule (25 mg total) by mouth 2 (two) times daily with a meal for 5 days. 10 capsule Bing Neighbors, NP      PDMP not reviewed this encounter.   Bing Neighbors, NP 02/16/23 1734

## 2023-09-12 ENCOUNTER — Telehealth: Payer: Self-pay | Admitting: Neurology

## 2023-09-12 NOTE — Telephone Encounter (Signed)
Received sleep referral from [facility] for narcolepsy, placed in sleep referrals box.

## 2024-03-21 ENCOUNTER — Encounter (HOSPITAL_COMMUNITY): Payer: Self-pay | Admitting: Interventional Radiology

## 2024-04-17 ENCOUNTER — Other Ambulatory Visit: Payer: Self-pay

## 2024-04-17 ENCOUNTER — Emergency Department (HOSPITAL_COMMUNITY)

## 2024-04-17 ENCOUNTER — Emergency Department (HOSPITAL_COMMUNITY)
Admission: EM | Admit: 2024-04-17 | Discharge: 2024-04-17 | Disposition: A | Discharge status: Home / Self Care | Attending: Emergency Medicine | Admitting: Emergency Medicine

## 2024-04-17 DIAGNOSIS — R519 Headache, unspecified: Secondary | ICD-10-CM | POA: Insufficient documentation

## 2024-04-17 DIAGNOSIS — Z8669 Personal history of other diseases of the nervous system and sense organs: Secondary | ICD-10-CM | POA: Insufficient documentation

## 2024-04-17 DIAGNOSIS — Z7901 Long term (current) use of anticoagulants: Secondary | ICD-10-CM | POA: Diagnosis not present

## 2024-04-17 LAB — BASIC METABOLIC PANEL WITH GFR
Anion gap: 11 (ref 5–15)
BUN: 9 mg/dL (ref 6–20)
CO2: 22 mmol/L (ref 22–32)
Calcium: 9.2 mg/dL (ref 8.9–10.3)
Chloride: 104 mmol/L (ref 98–111)
Creatinine, Ser: 0.78 mg/dL (ref 0.44–1.00)
GFR, Estimated: >60 mL/min (ref >60)
Glucose, Bld: 100 mg/dL — ABNORMAL HIGH (ref 70–99)
Potassium: 3.7 mmol/L (ref 3.5–5.1)
Sodium: 137 mmol/L (ref 135–145)

## 2024-04-17 LAB — CBC WITH DIFFERENTIAL/PLATELET
Abs Immature Granulocytes: 0.02 K/uL (ref 0.00–0.07)
Basophils Absolute: 0.1 K/uL (ref 0.0–0.1)
Basophils Relative: 1 %
Eosinophils Absolute: 0 K/uL (ref 0.0–0.5)
Eosinophils Relative: 1 %
HCT: 42.2 % (ref 36.0–46.0)
Hemoglobin: 13.5 g/dL (ref 12.0–15.0)
Immature Granulocytes: 0 %
Lymphocytes Relative: 22 %
Lymphs Abs: 1.8 K/uL (ref 0.7–4.0)
MCH: 28.2 pg (ref 26.0–34.0)
MCHC: 32 g/dL (ref 30.0–36.0)
MCV: 88.1 fL (ref 80.0–100.0)
Monocytes Absolute: 0.4 K/uL (ref 0.1–1.0)
Monocytes Relative: 5 %
Neutro Abs: 5.6 K/uL (ref 1.7–7.7)
Neutrophils Relative %: 71 %
Platelets: 366 K/uL (ref 150–400)
RBC: 4.79 MIL/uL (ref 3.87–5.11)
RDW: 12.2 % (ref 11.5–15.5)
WBC: 7.9 K/uL (ref 4.0–10.5)
nRBC: 0 % (ref 0.0–0.2)

## 2024-04-17 LAB — POC URINE PREG, ED: Preg Test, Ur: NEGATIVE

## 2024-04-17 MED ORDER — SODIUM CHLORIDE 0.9 % IV BOLUS
1000.0000 mL | Freq: Once | INTRAVENOUS | Status: AC
  Administered 2024-04-17: 1000 mL via INTRAVENOUS

## 2024-04-17 MED ORDER — ONDANSETRON 4 MG PO TBDP
4.0000 mg | ORAL_TABLET | Freq: Once | ORAL | Status: AC
  Administered 2024-04-17: 4 mg via ORAL
  Filled 2024-04-17: qty 1

## 2024-04-17 MED ORDER — METOCLOPRAMIDE HCL 5 MG/ML IJ SOLN
10.0000 mg | Freq: Once | INTRAMUSCULAR | Status: AC
  Administered 2024-04-17: 10 mg via INTRAVENOUS
  Filled 2024-04-17: qty 2

## 2024-04-17 MED ORDER — DEXAMETHASONE SODIUM PHOSPHATE 10 MG/ML IJ SOLN
10.0000 mg | Freq: Once | INTRAMUSCULAR | Status: AC
  Administered 2024-04-17: 10 mg via INTRAVENOUS
  Filled 2024-04-17: qty 1

## 2024-04-17 MED ORDER — MAGNESIUM SULFATE 2 GM/50ML IV SOLN
2.0000 g | Freq: Once | INTRAVENOUS | Status: AC
  Administered 2024-04-17: 2 g via INTRAVENOUS
  Filled 2024-04-17: qty 50

## 2024-04-17 MED ORDER — KETOROLAC TROMETHAMINE 30 MG/ML IJ SOLN
15.0000 mg | Freq: Once | INTRAMUSCULAR | Status: AC
  Administered 2024-04-17: 15 mg via INTRAVENOUS
  Filled 2024-04-17: qty 1

## 2024-04-17 MED ORDER — IOHEXOL 350 MG/ML SOLN
75.0000 mL | Freq: Once | INTRAVENOUS | Status: AC | PRN
  Administered 2024-04-17: 75 mL via INTRAVENOUS

## 2024-04-17 MED ORDER — DIPHENHYDRAMINE HCL 50 MG/ML IJ SOLN
25.0000 mg | Freq: Once | INTRAMUSCULAR | Status: AC
  Administered 2024-04-17: 25 mg via INTRAVENOUS
  Filled 2024-04-17: qty 1

## 2024-04-17 NOTE — ED Notes (Signed)
 Pt asked for urine sample; told to call out when able to provide.

## 2024-04-17 NOTE — ED Provider Notes (Signed)
  Physical Exam  BP 103/80   Pulse 72   Temp 98.3 F (36.8 C) (Oral)   Resp 16   Ht 5' (1.524 m)   Wt 97.5 kg   LMP 04/10/2024   SpO2 100%   BMI 41.99 kg/m   Physical Exam  Procedures  Procedures  ED Course / MDM   Clinical Course as of 04/17/24 1540  Thu Apr 17, 2024  1516 Intractable migraines since Monday, h/o of b/l ophthalmic aneurysms, pending read of CTA. Likely DC if imaging wnl. HA is currently resolved s/p IV meds [AD]    Clinical Course User Index [AD] Raoul Rake, MD   Medical Decision Making Amount and/or Complexity of Data Reviewed Labs: ordered. Radiology: ordered.  Risk Prescription drug management.   ***

## 2024-04-17 NOTE — ED Provider Notes (Signed)
 [place] [facility]Y DEPARTMENT AT [facility] Provider Note   CSN: [id number] Arrival date & time: 04/17/24  1029     Patient presents with: Headache, Emesis, Nausea, and Facial Swelling   [name] is a 38 y.o. female.   [name] is a 38 y.o. female with history of migraines, IBS, intracranial aneurysms, who presents to the emergency department for evaluation of severe headache.  Patient reports that headache first developed on Monday she took 2 Excedrin and it started to ease off but then returned in intensity and has continued to wax and wane over the past 4 days.  She has associated light sensitivity and nausea but no vomiting.  Has also reported some right-sided facial paresthesias and muscle twitching but no other numbness weakness or visual changes.  Patient does have a history of a complex migraine with right-sided hemiparesis, underwent stroke workup and was incidentally noted to have 2 small intracranial aneurysms behind the eyes but no evidence of ischemic stroke.  Patient has had allergic reaction to all triptans and so has primarily used Excedrin, and Aleve to try and help treat symptoms but has not been able to get resolution in migraine.  Went to urgent care but given her history of intracranial aneurysms and was sent in for further evaluation.  The history is provided by the patient, the spouse and medical records.  Headache Associated symptoms: photophobia and vomiting   Associated symptoms: no dizziness, no numbness and no weakness   Emesis Associated symptoms: headaches        Prior to Admission medications   Medication Sig Start Date End Date Taking? Authorizing Provider  albuterol  (VENTOLIN  HFA) 108 (90 Base) MCG/ACT inhaler Inhale 2 puffs into the lungs every 4 (four) hours as needed. 03/17/19   [provider]  EPINEPHrine  0.3 mg/0.3 mL IJ SOAJ injection Inject 0.3 mg into the muscle once as needed (for severe allergic reaction).     [provider]  famotidine  (PEPCID ) 20 MG tablet 1 pill every morning after waking and 1 pill at bedtime every night. 10/17/19   Teressa Toribio SQUIBB, MD  HYDROcodone -acetaminophen  (NORCO/VICODIN) 5-325 MG tablet Take 1 tablet by mouth every 6 (six) hours as needed for severe pain. 01/07/23   Geiple, Joshua, PA-C  montelukast  (SINGULAIR ) 10 MG tablet One at bedtime 08/29/19   Wert, Michael B, MD  norgestrel-ethinyl estradiol  (CRYSELLE-28) 0.3-30 MG-MCG tablet Take 1 tablet by mouth daily.    [provider]  predniSONE  (DELTASONE ) 20 MG tablet Take 2 tablets daily with breakfast. 11/16/22   Christopher Savannah, PA-C  rivaroxaban (XARELTO) 10 MG TABS tablet Take 20 mg by mouth daily. Patient on starter pack    [provider]  traZODone  (DESYREL ) 50 MG tablet TAKE 1 TABLET BY MOUTH EVERYDAY AT BEDTIME 02/27/19   Whitfield Raisin, NP    Allergies: Other, Soy allergy  (obsolete), Gluten meal, Sumatriptan, Zolmitriptan, Ciprofloxacin, Nsaids, Topiramate, Adhesive [tape], and Triptans    Review of Systems  Eyes:  Positive for photophobia. Negative for visual disturbance.  Gastrointestinal:  Positive for vomiting.  Neurological:  Positive for headaches. Negative for dizziness, syncope, weakness, light-headedness and numbness.  All other systems reviewed and are negative.   Updated Vital Signs BP 103/80   Pulse 72   Temp 98.3 F (36.8 C) (Oral)   Resp 16   Ht 5' (1.524 m)   Wt 97.5 kg   LMP 04/10/2024   SpO2 100%   BMI 41.99 kg/m   Physical  Exam Vitals and nursing note reviewed.  Constitutional:      General: She is not in acute distress.    Appearance: Normal appearance. She is well-developed and normal weight. She is not ill-appearing or diaphoretic.  HENT:     Head: Normocephalic and atraumatic.  Eyes:     General:        Right eye: No discharge.        Left eye: No discharge.     Extraocular Movements: Extraocular movements intact.     Pupils: Pupils are equal, round,  and reactive to light.  Cardiovascular:     Rate and Rhythm: Normal rate and regular rhythm.     Pulses: Normal pulses.     Heart sounds: Normal heart sounds.  Pulmonary:     Effort: Pulmonary effort is normal. No respiratory distress.     Breath sounds: Normal breath sounds. No wheezing or rales.     Comments: Respirations equal and unlabored, patient able to speak in full sentences, lungs clear to auscultation bilaterally  Abdominal:     General: Bowel sounds are normal. There is no distension.     Palpations: Abdomen is soft. There is no mass.     Tenderness: There is no abdominal tenderness. There is no guarding.     Comments: Abdomen soft, nondistended, nontender to palpation in all quadrants without guarding or peritoneal signs  Musculoskeletal:        General: No deformity.     Cervical back: Neck supple.  Skin:    General: Skin is warm and dry.     Capillary Refill: Capillary refill takes less than 2 seconds.  Neurological:     Mental Status: She is alert and oriented to person, place, and time.     GCS: GCS eye subscore is 4. GCS verbal subscore is 5. GCS motor subscore is 6.     Coordination: Coordination normal.     Comments: Speech is clear, able to follow commands CN III-XII intact Normal strength in upper and lower extremities bilaterally including dorsiflexion and plantar flexion, strong and equal grip strength Sensation normal to light and sharp touch Moves extremities without ataxia, coordination intact  Psychiatric:        Mood and Affect: Mood normal.        Behavior: Behavior normal.     (all labs ordered are listed, but only abnormal results are displayed) Labs Reviewed  BASIC METABOLIC PANEL WITH GFR - Abnormal; Notable for the following components:      Result Value   Glucose, Bld 100 (*)    All other components within normal limits  CBC WITH DIFFERENTIAL/PLATELET  POC URINE PREG, ED    EKG: None  Radiology: No results found.   Procedures    Medications Ordered in the ED  ondansetron  (ZOFRAN -ODT) disintegrating tablet 4 mg (4 mg Oral Given 04/17/24 1101)  sodium chloride  0.9 % bolus 1,000 mL (0 mLs Intravenous Stopped 04/17/24 1410)  ketorolac  (TORADOL ) 30 MG/ML injection 15 mg (15 mg Intravenous Given 04/17/24 1241)  metoCLOPramide  (REGLAN ) injection 10 mg (10 mg Intravenous Given 04/17/24 1242)  diphenhydrAMINE  (BENADRYL ) injection 25 mg (25 mg Intravenous Given 04/17/24 1242)  dexamethasone  (DECADRON ) injection 10 mg (10 mg Intravenous Given 04/17/24 1242)  magnesium  sulfate IVPB 2 g 50 mL (0 g Intravenous Stopped 04/17/24 1410)  iohexol  (OMNIPAQUE ) 350 MG/ML injection 75 mL (75 mLs Intravenous Contrast Given 04/17/24 1452)    Clinical Course as of 04/17/24 1527  Thu Apr 17, 2024  1516 Intractable migraines since Monday, h/o of b/l ophthalmic aneurysms, pending read of CTA. Likely DC if imaging wnl. HA is currently resolved s/p IV meds [AD]    Clinical Course User Index [AD] Raoul Rake, MD                                 Medical Decision Making Amount and/or Complexity of Data Reviewed Labs: ordered. Radiology: ordered.  Risk Prescription drug management.   38 year old female presents with persistent 4-day right-sided headache with some associated facial paresthesias but no numbness, weakness or visual changes, has had some associated nausea but no vomiting.  History of bilateral ophthalmic aneurysms.  Exam is overall reassuring but given history of aneurysms will check basic labs and CT angio of the head and will give IV migraine cocktail.  Patient's lab work is overall unremarkable.  After receiving IV Toradol , Reglan , Benadryl , Decadron , magnesium  and 1 L IV fluid bolus migraine is resolving.  At shift change CTA is pending, if no acute abnormality or acute changes and aneurysms anticipate discharge home, care signed out to Dr. Raoul.     Final diagnoses:  Bad headache    ED Discharge Orders      None          Rayvin Abid F, PA-C 04/17/24 1531    Emil Share, DO 04/18/24 0800

## 2024-04-17 NOTE — Discharge Instructions (Addendum)
 You were seen today for headache. While you were here we monitored your vitals, performed a physical exam, and checked labs + imaging of your head. These were all reassuring and there is no indication for any further testing or intervention in the emergency department at this time.   Things to do:  - Follow up with your primary care provider within the next 1-2 weeks - Take ibuprofen /tylenol  as needed for mild-moderate headache. If your headache returns and is severe + not improving, or you have new neurological symptoms, please seek urgent medical care or return to the ED

## 2024-04-17 NOTE — ED Triage Notes (Addendum)
 Pt. Stated, [name] had a Migraine since Monday evening. Ive also had some nausea, and the right side of my face is numb some. I have 2 brain aneurysms located behind the eyes on both sides. Ive taken all the OTC medications none works
